# Patient Record
Sex: Female | Born: 1945
Health system: Southern US, Community
[De-identification: ages and names within clinical notes are randomized; demographics above are authoritative.]

## PROBLEM LIST (undated history)

## (undated) DIAGNOSIS — F32A Depression, unspecified: Secondary | ICD-10-CM

## (undated) DIAGNOSIS — I10 Essential (primary) hypertension: Secondary | ICD-10-CM

## (undated) DIAGNOSIS — E785 Hyperlipidemia, unspecified: Secondary | ICD-10-CM

## (undated) DIAGNOSIS — Z9889 Other specified postprocedural states: Secondary | ICD-10-CM

## (undated) DIAGNOSIS — E1165 Type 2 diabetes mellitus with hyperglycemia: Secondary | ICD-10-CM

## (undated) DIAGNOSIS — F419 Anxiety disorder, unspecified: Secondary | ICD-10-CM

## (undated) DIAGNOSIS — F329 Major depressive disorder, single episode, unspecified: Secondary | ICD-10-CM

## (undated) DIAGNOSIS — T8859XA Other complications of anesthesia, initial encounter: Secondary | ICD-10-CM

## (undated) DIAGNOSIS — R112 Nausea with vomiting, unspecified: Secondary | ICD-10-CM

## (undated) DIAGNOSIS — G43909 Migraine, unspecified, not intractable, without status migrainosus: Secondary | ICD-10-CM

## (undated) DIAGNOSIS — M8580 Other specified disorders of bone density and structure, unspecified site: Secondary | ICD-10-CM

## (undated) HISTORY — PX: CHOLECYSTECTOMY: SHX55

## (undated) HISTORY — DX: Migraine, unspecified, not intractable, without status migrainosus: G43.909

## (undated) HISTORY — PX: APPENDECTOMY: SHX54

## (undated) HISTORY — DX: Major depressive disorder, single episode, unspecified: F32.9

## (undated) HISTORY — DX: Hyperlipidemia, unspecified: E78.5

## (undated) HISTORY — DX: Type 2 diabetes mellitus with hyperglycemia: E11.65

## (undated) HISTORY — DX: Essential (primary) hypertension: I10

## (undated) HISTORY — DX: Depression, unspecified: F32.A

## (undated) HISTORY — DX: Other specified disorders of bone density and structure, unspecified site: M85.80

## (undated) HISTORY — PX: ABDOMINAL HYSTERECTOMY: SHX81

## (undated) HISTORY — DX: Anxiety disorder, unspecified: F41.9

---

## 2004-08-20 ENCOUNTER — Ambulatory Visit: Payer: Self-pay | Admitting: Internal Medicine

## 2005-01-02 ENCOUNTER — Emergency Department (HOSPITAL_COMMUNITY): Admission: EM | Admit: 2005-01-02 | Discharge: 2005-01-02 | Payer: Self-pay | Admitting: Emergency Medicine

## 2005-01-02 IMAGING — US US ABDOMEN COMPLETE
1 series · 14 of 25 positions shown · non-contrast
Comparison: None.

ABDOMEN ULTRASOUND:

CLINICAL DATA: Abdominal pain.
TECHNIQUE: Complete abdominal ultrasound examination was performed including
evaluation of the liver, gallbladder, bile ducts, pancreas, kidneys, spleen,
IVC, and abdominal aorta.

[Series 1: unknown · 0.30mm/px · 14 of 73 slices shown]
[im 1/73]
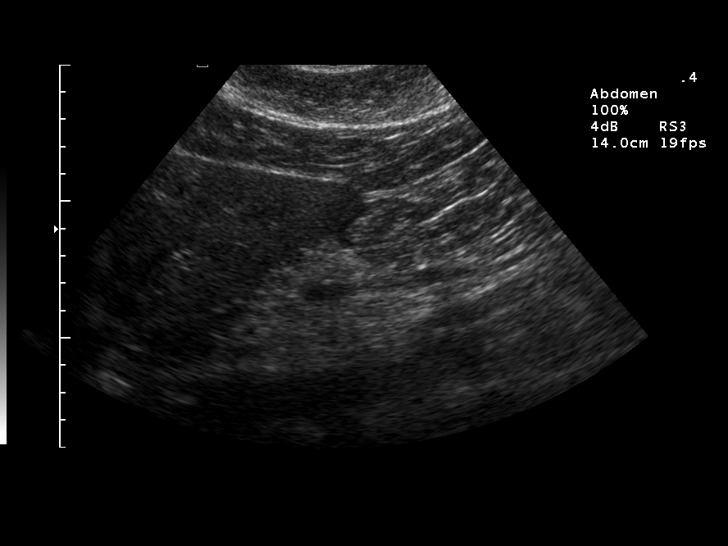
[im 7/73]
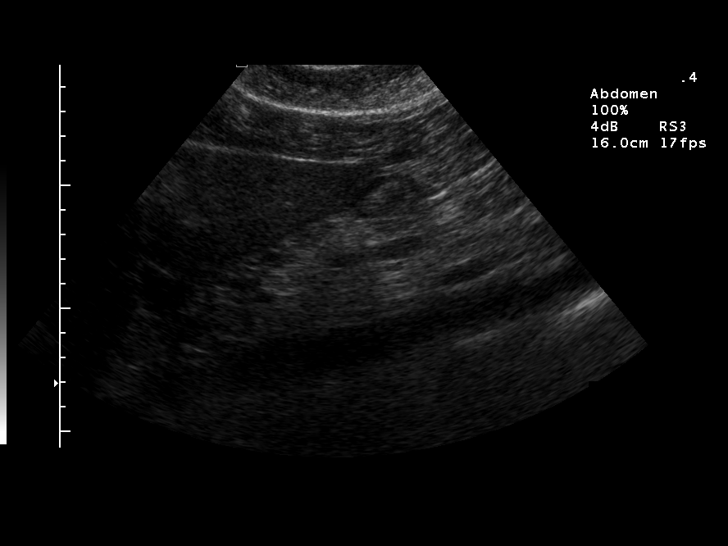
[im 13/73]
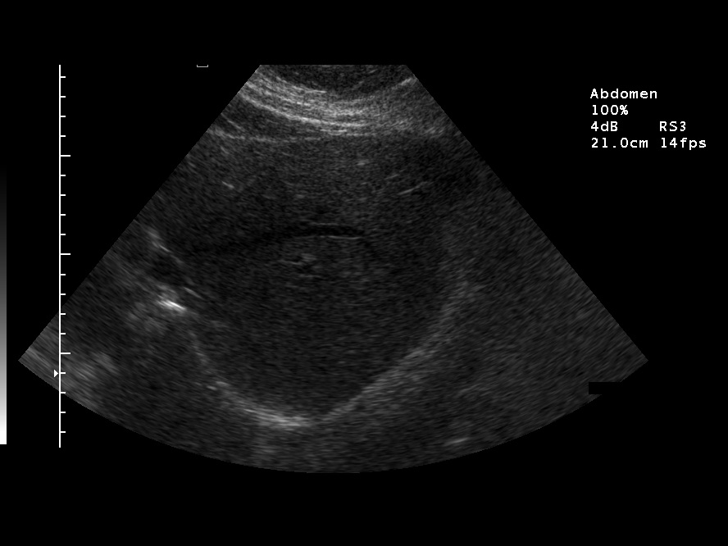
[im 19/73]
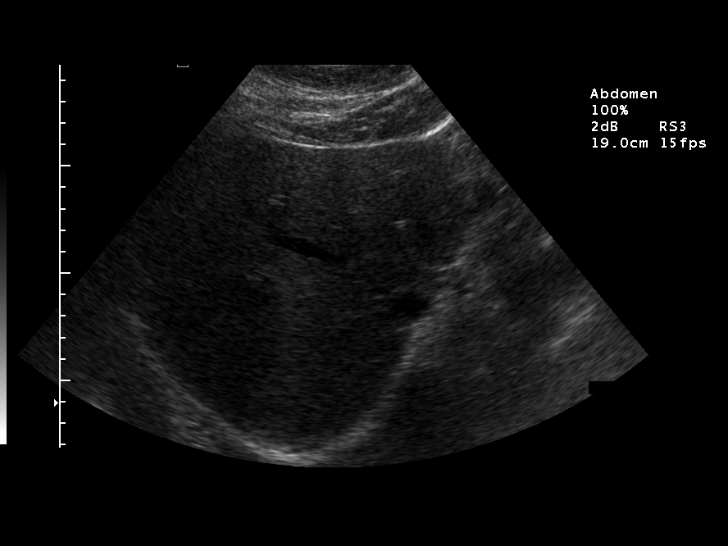
[im 25/73]
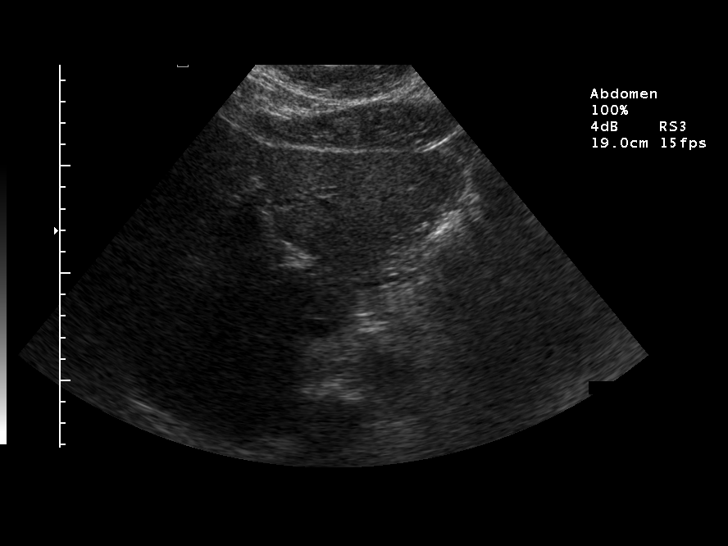
[im 28/73]
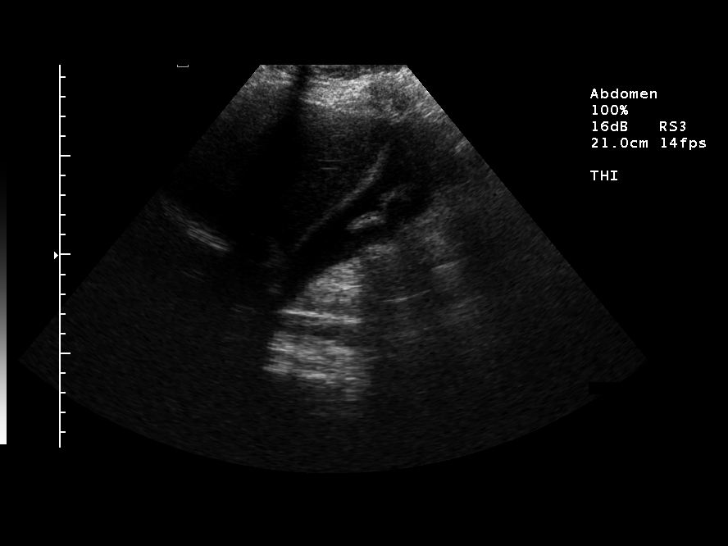
[im 34/73]
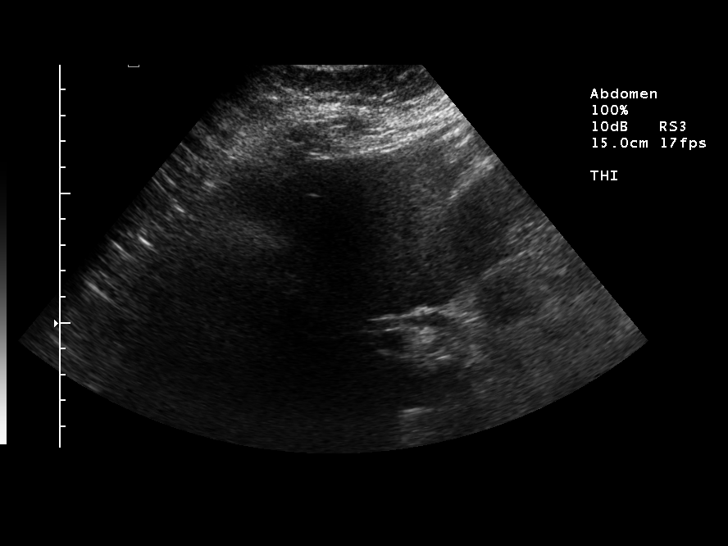
[im 40/73]
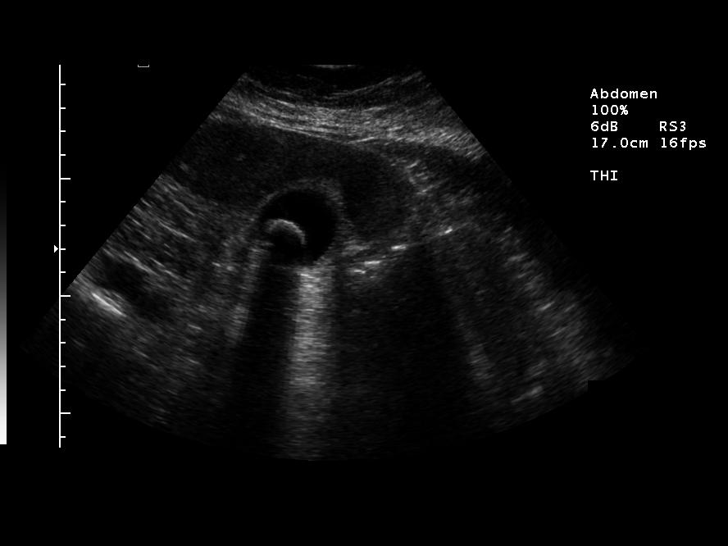
[im 46/73]
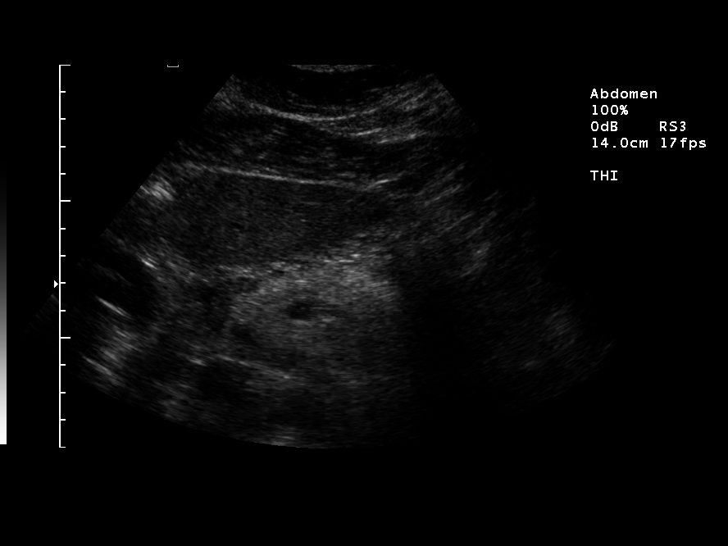
[im 49/73]
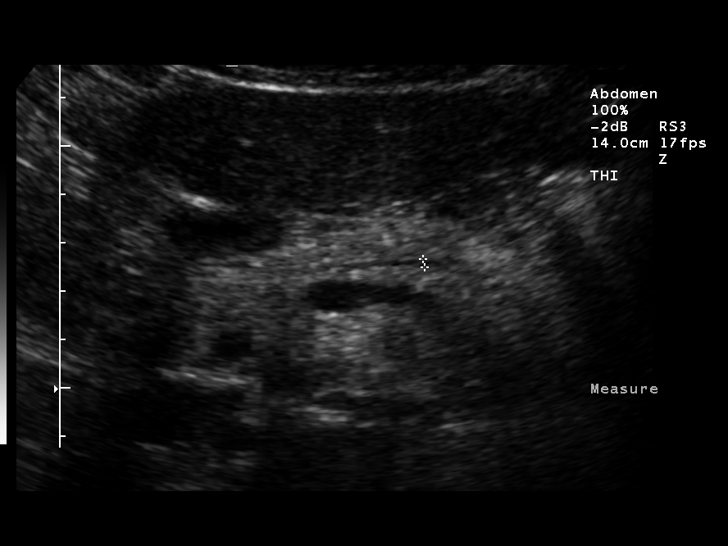
[im 55/73]
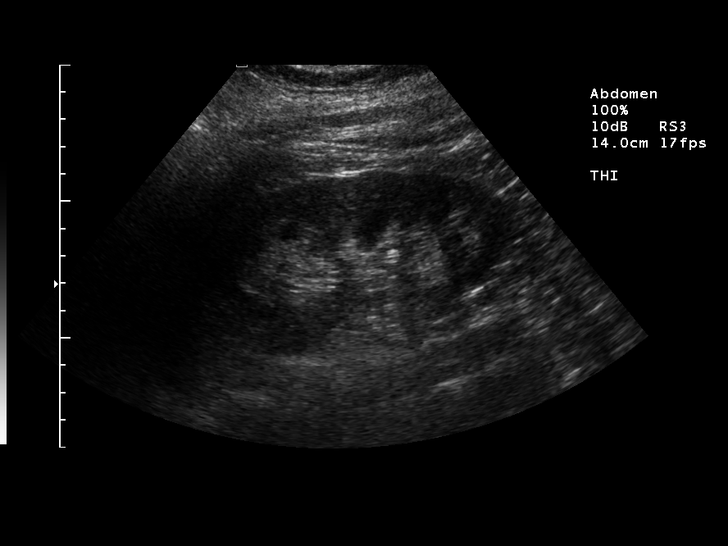
[im 61/73]
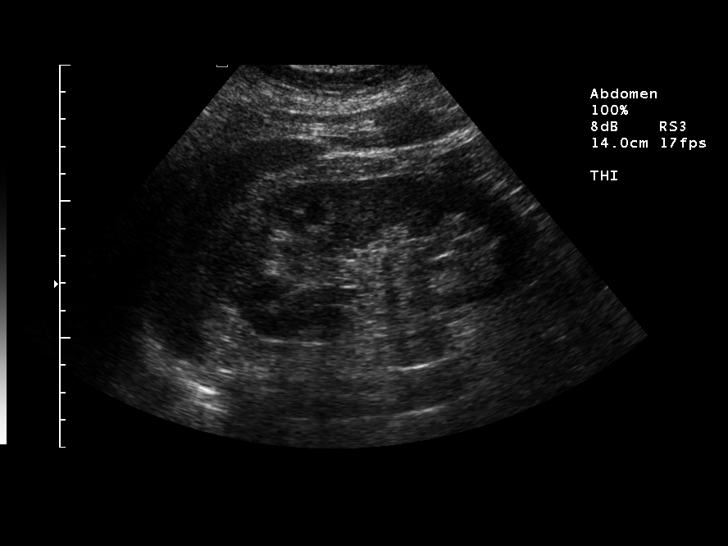
[im 67/73]
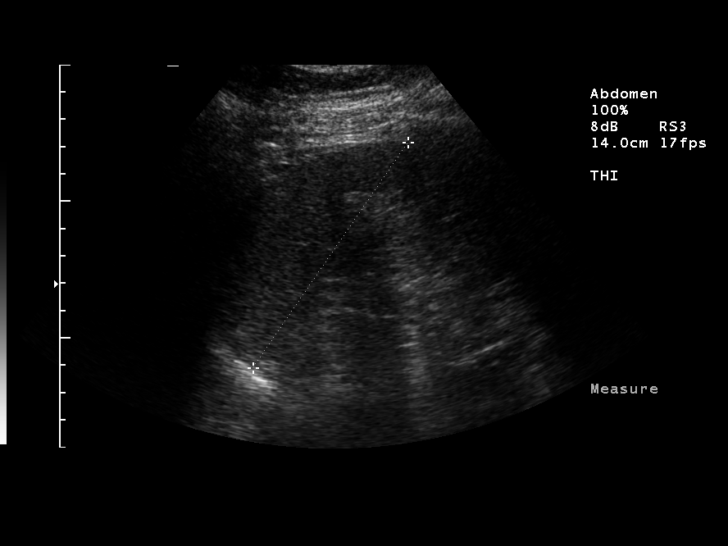
[im 73/73]
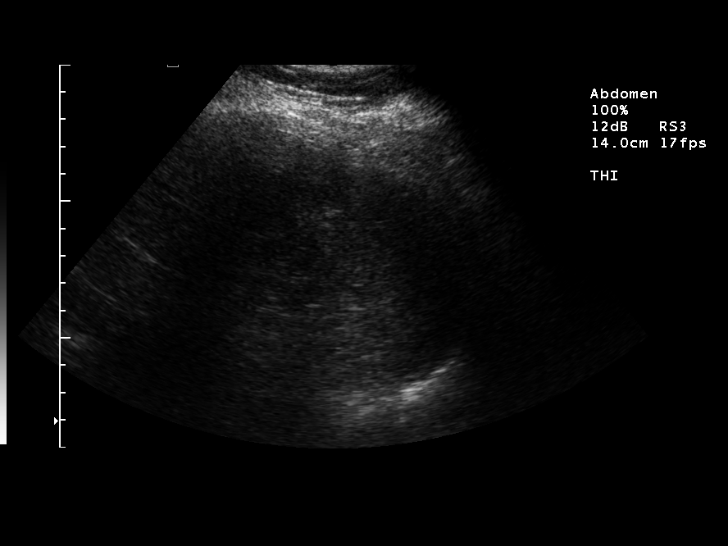

[14 of 25 positions shown; findings below may reference images not displayed]

FINDINGS: Gallbladder: Pole echogenic stones in the gallbladder lumen. There is no
pericholecystic fluid or gallbladder wall thickening. No sonographic Murphy sign
is elicited by the sonographer 's report.

Common Bile Duct:  Nondilated. 4-5 mm in diameter.

Liver:  Normal

Inferior Vena Cava:  Normal

Pancreas:  Tail not well seen secondary to overlying bowel gas.

Spleen:  Normal

Right Kidney:  Normal

Left Kidney:  5 mm nonobstructing interpolar stone.

Aorta:  No aneurysm
IMPRESSION: Uncomplicated cholelithiasis.

Apparent 5 mm nonobstructing left renal stone.

## 2005-02-04 IMAGING — CR DG CHEST 2V
2 series · 2 of 2 positions shown · non-contrast
Comparison: None.

CLINICAL DATA: Preop respiratory exam for cholecystectomy.  
 PA AND LATERAL CHEST:

[view not recorded (1 of 2)]
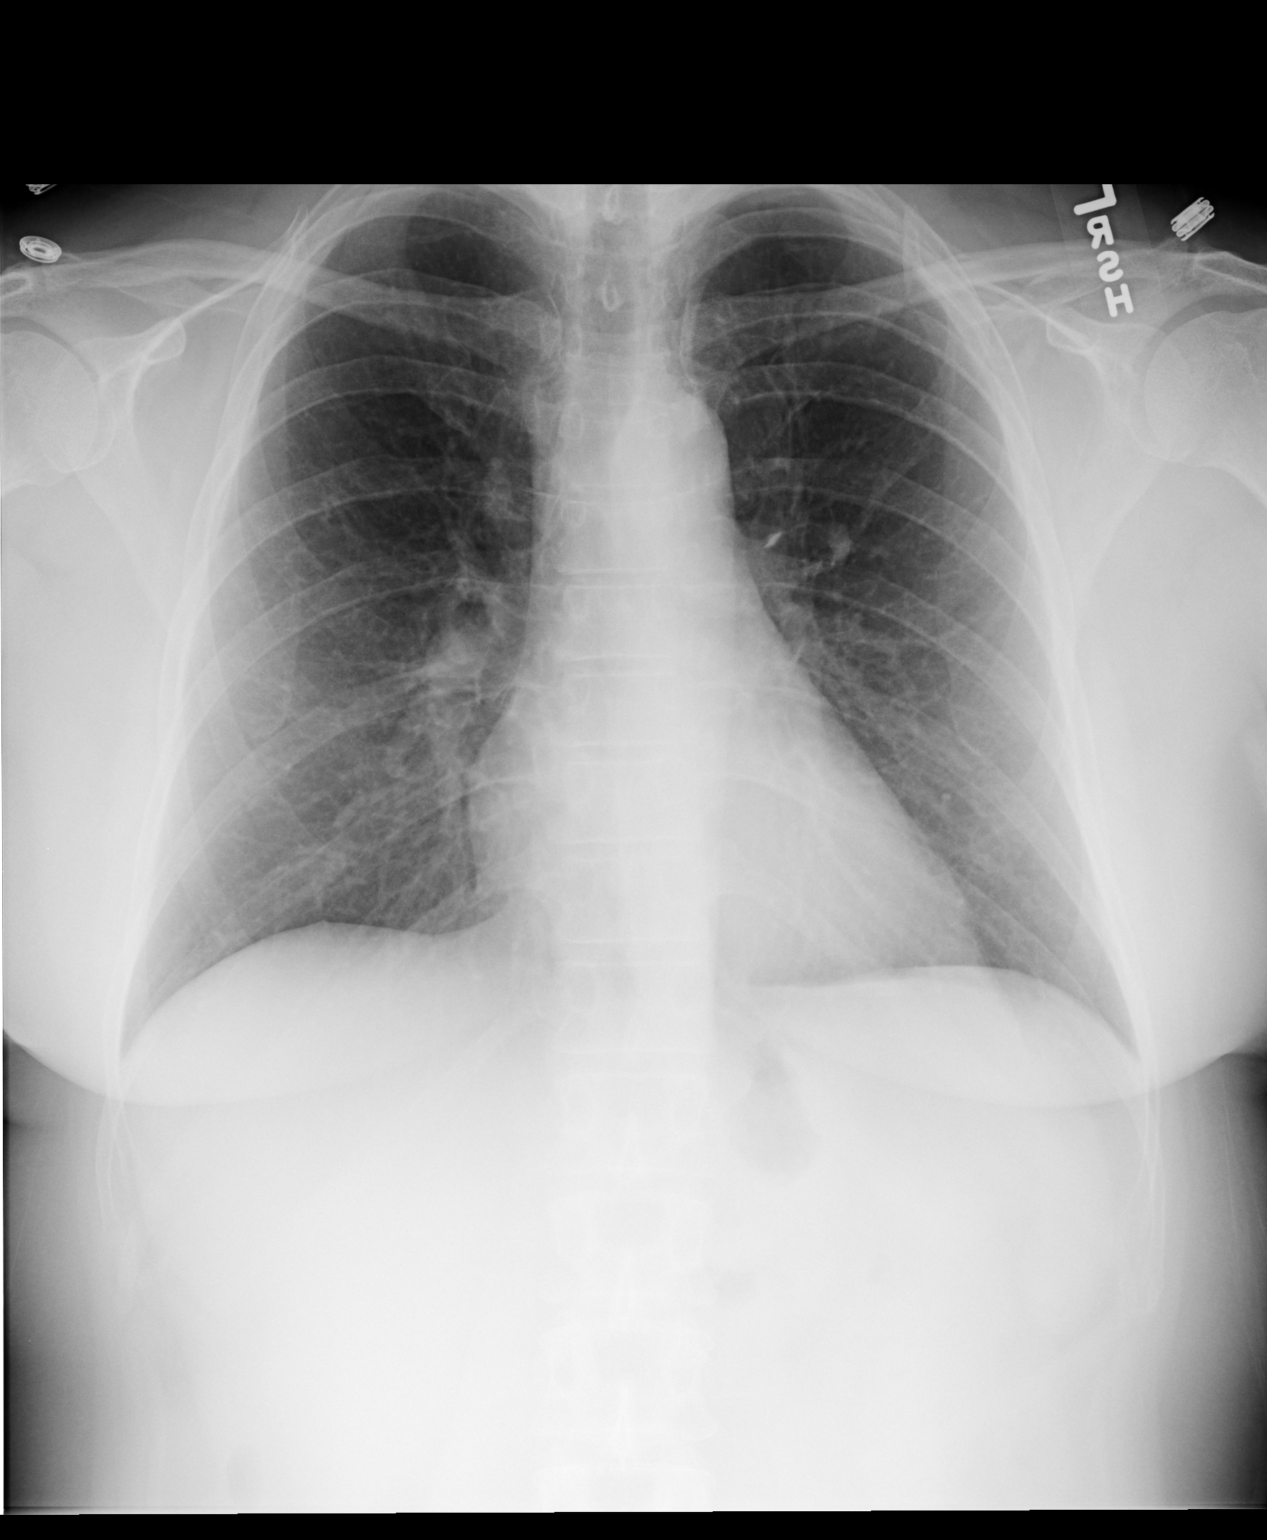

[view not recorded (2 of 2)]
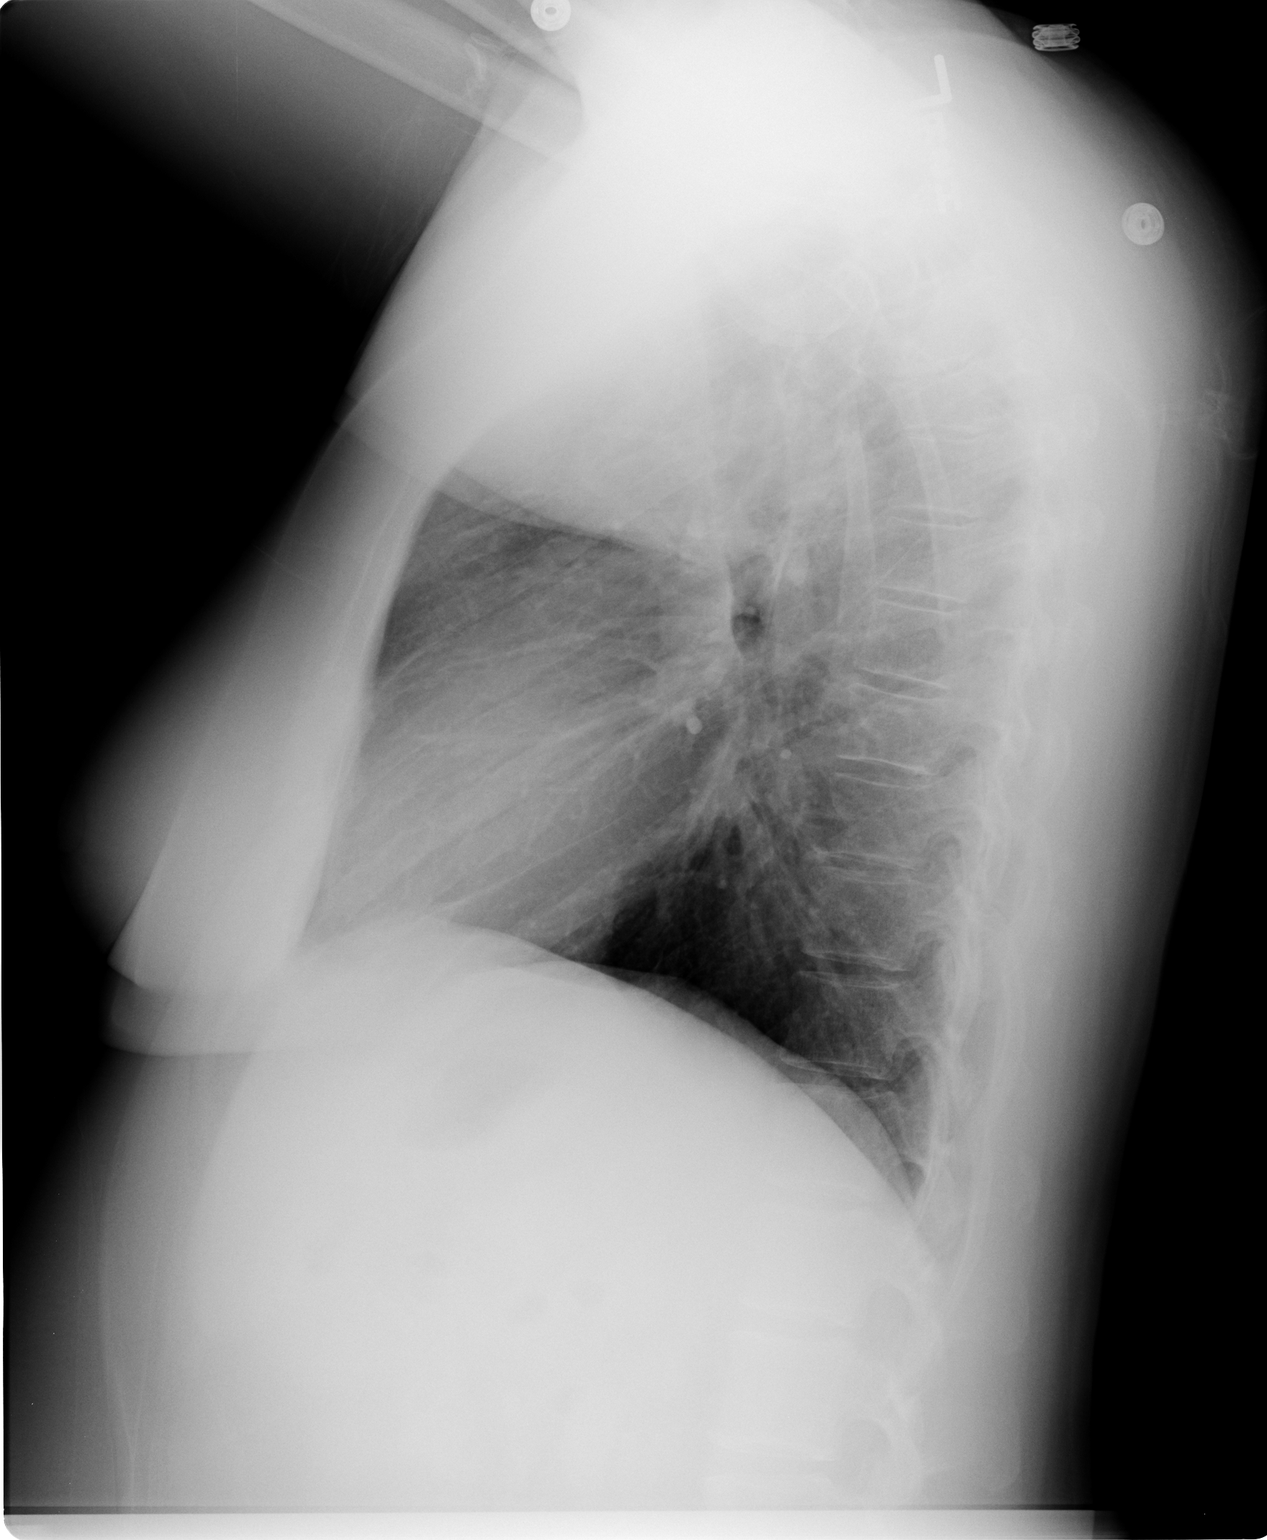

[2 of 2 positions shown; findings below may reference images not displayed]

The cardiomediastinal contours are normal.  The lungs are clear.  There is no pleural effusion or pneumothorax.
IMPRESSION: No active cardiopulmonary process demonstrated.

## 2005-02-08 ENCOUNTER — Ambulatory Visit (HOSPITAL_COMMUNITY): Admission: RE | Admit: 2005-02-08 | Discharge: 2005-02-09 | Payer: Self-pay | Admitting: General Surgery

## 2005-02-08 ENCOUNTER — Encounter (INDEPENDENT_AMBULATORY_CARE_PROVIDER_SITE_OTHER): Payer: Self-pay | Admitting: *Deleted

## 2005-09-03 ENCOUNTER — Ambulatory Visit: Payer: Self-pay | Admitting: Internal Medicine

## 2006-08-08 ENCOUNTER — Ambulatory Visit: Payer: Self-pay | Admitting: Internal Medicine

## 2006-08-08 LAB — CONVERTED CEMR LAB
AST: 28 units/L (ref 0–37)
Bilirubin Urine: NEGATIVE
Cholesterol: 219 mg/dL (ref 0–200)
Eosinophils Absolute: 0.2 10*3/uL (ref 0.0–0.6)
Eosinophils Relative: 2.5 % (ref 0.0–5.0)
GFR calc non Af Amer: 78 mL/min
Hemoglobin: 12.9 g/dL (ref 12.0–15.0)
Lymphocytes Relative: 31.4 % (ref 12.0–46.0)
MCHC: 35 g/dL (ref 30.0–36.0)
MCV: 92.3 fL (ref 78.0–100.0)
Monocytes Absolute: 0.5 10*3/uL (ref 0.2–0.7)
Monocytes Relative: 5.2 % (ref 3.0–11.0)
Neutro Abs: 5.3 10*3/uL (ref 1.4–7.7)
Nitrite: NEGATIVE
Potassium: 4.3 meq/L (ref 3.5–5.1)
RBC: 3.99 M/uL (ref 3.87–5.11)
Sodium: 137 meq/L (ref 135–145)
Specific Gravity, Urine: 1.025 (ref 1.000–1.03)
TSH: 1.86 microintl units/mL (ref 0.35–5.50)
Total Bilirubin: 0.7 mg/dL (ref 0.3–1.2)
Total Protein: 7.4 g/dL (ref 6.0–8.3)
Triglycerides: 137 mg/dL (ref 0–149)
VLDL: 27 mg/dL (ref 0–40)
WBC: 8.7 10*3/uL (ref 4.5–10.5)

## 2006-08-13 ENCOUNTER — Ambulatory Visit: Payer: Self-pay | Admitting: Internal Medicine

## 2007-05-25 ENCOUNTER — Telehealth (INDEPENDENT_AMBULATORY_CARE_PROVIDER_SITE_OTHER): Payer: Self-pay | Admitting: *Deleted

## 2007-05-27 ENCOUNTER — Ambulatory Visit: Payer: Self-pay | Admitting: Internal Medicine

## 2007-05-27 DIAGNOSIS — F32A Depression, unspecified: Secondary | ICD-10-CM | POA: Insufficient documentation

## 2007-05-27 DIAGNOSIS — E785 Hyperlipidemia, unspecified: Secondary | ICD-10-CM | POA: Insufficient documentation

## 2007-05-27 DIAGNOSIS — M949 Disorder of cartilage, unspecified: Secondary | ICD-10-CM

## 2007-05-27 DIAGNOSIS — M899 Disorder of bone, unspecified: Secondary | ICD-10-CM | POA: Insufficient documentation

## 2007-05-27 DIAGNOSIS — I10 Essential (primary) hypertension: Secondary | ICD-10-CM | POA: Insufficient documentation

## 2007-05-27 DIAGNOSIS — H8110 Benign paroxysmal vertigo, unspecified ear: Secondary | ICD-10-CM | POA: Insufficient documentation

## 2007-05-27 DIAGNOSIS — F329 Major depressive disorder, single episode, unspecified: Secondary | ICD-10-CM

## 2007-05-27 DIAGNOSIS — F411 Generalized anxiety disorder: Secondary | ICD-10-CM | POA: Insufficient documentation

## 2007-07-08 ENCOUNTER — Ambulatory Visit: Payer: Self-pay | Admitting: Internal Medicine

## 2007-07-09 LAB — CONVERTED CEMR LAB
Albumin: 4 g/dL (ref 3.5–5.2)
Alkaline Phosphatase: 54 units/L (ref 39–117)
BUN: 11 mg/dL (ref 6–23)
Bilirubin Urine: NEGATIVE
CO2: 29 meq/L (ref 19–32)
Calcium: 9.4 mg/dL (ref 8.4–10.5)
Cholesterol: 210 mg/dL (ref 0–200)
Direct LDL: 141.1 mg/dL
Eosinophils Absolute: 0.1 10*3/uL (ref 0.0–0.6)
Glucose, Bld: 105 mg/dL — ABNORMAL HIGH (ref 70–99)
HCT: 40.6 % (ref 36.0–46.0)
HDL: 44.3 mg/dL (ref >39.0)
Hemoglobin: 13.6 g/dL (ref 12.0–15.0)
Ketones, ur: NEGATIVE mg/dL
Lymphocytes Relative: 26.6 % (ref 12.0–46.0)
MCV: 93.8 fL (ref 78.0–100.0)
Monocytes Absolute: 0.8 10*3/uL — ABNORMAL HIGH (ref 0.2–0.7)
Neutro Abs: 5 10*3/uL (ref 1.4–7.7)
Potassium: 4.6 meq/L (ref 3.5–5.1)
Specific Gravity, Urine: >=1.03 (ref 1.000–1.03)
TSH: 1.29 microintl units/mL (ref 0.35–5.50)
Total CHOL/HDL Ratio: 4.7
Total Protein, Urine: NEGATIVE mg/dL
Triglycerides: 83 mg/dL (ref 0–149)
WBC: 8.1 10*3/uL (ref 4.5–10.5)
pH: 5.5 (ref 5.0–8.0)

## 2007-08-07 ENCOUNTER — Ambulatory Visit: Payer: Self-pay | Admitting: Internal Medicine

## 2008-01-29 ENCOUNTER — Ambulatory Visit: Payer: Self-pay | Admitting: Internal Medicine

## 2008-01-29 LAB — CONVERTED CEMR LAB
AST: 23 units/L (ref 0–37)
Alkaline Phosphatase: 51 units/L (ref 39–117)
Bilirubin, Direct: 0.1 mg/dL (ref 0.0–0.3)
Cholesterol: 176 mg/dL (ref 0–200)
HDL: 36.5 mg/dL — ABNORMAL LOW (ref >39.0)
LDL Cholesterol: 122 mg/dL — ABNORMAL HIGH (ref 0–99)
Total Bilirubin: 0.7 mg/dL (ref 0.3–1.2)
Triglycerides: 90 mg/dL (ref 0–149)
VLDL: 18 mg/dL (ref 0–40)

## 2008-02-03 ENCOUNTER — Ambulatory Visit: Payer: Self-pay | Admitting: Internal Medicine

## 2008-02-03 DIAGNOSIS — R259 Unspecified abnormal involuntary movements: Secondary | ICD-10-CM | POA: Insufficient documentation

## 2008-02-03 DIAGNOSIS — M79609 Pain in unspecified limb: Secondary | ICD-10-CM | POA: Insufficient documentation

## 2008-07-28 ENCOUNTER — Ambulatory Visit: Payer: Self-pay | Admitting: Internal Medicine

## 2008-07-29 LAB — CONVERTED CEMR LAB
AST: 22 units/L (ref 0–37)
Albumin: 3.9 g/dL (ref 3.5–5.2)
BUN: 14 mg/dL (ref 6–23)
Basophils Absolute: 0.1 10*3/uL (ref 0.0–0.1)
Bilirubin Urine: NEGATIVE
CO2: 29 meq/L (ref 19–32)
Calcium: 9.4 mg/dL (ref 8.4–10.5)
Cholesterol: 126 mg/dL (ref 0–200)
Creatinine, Ser: 0.8 mg/dL (ref 0.4–1.2)
GFR calc Af Amer: 94 mL/min
Glucose, Bld: 107 mg/dL — ABNORMAL HIGH (ref 70–99)
HDL: 43.6 mg/dL (ref >39.0)
Hemoglobin, Urine: NEGATIVE
Hemoglobin: 14.4 g/dL (ref 12.0–15.0)
LDL Cholesterol: 67 mg/dL (ref 0–99)
Leukocytes, UA: NEGATIVE
Lymphocytes Relative: 28.4 % (ref 12.0–46.0)
Neutro Abs: 4.5 10*3/uL (ref 1.4–7.7)
Neutrophils Relative %: 56.7 % (ref 43.0–77.0)
Nitrite: NEGATIVE
Platelets: 227 10*3/uL (ref 150–400)
RDW: 12.5 % (ref 11.5–14.6)
Sodium: 140 meq/L (ref 135–145)
Specific Gravity, Urine: <=1.005 (ref 1.000–1.03)
Total CHOL/HDL Ratio: 2.9
Total Protein, Urine: NEGATIVE mg/dL
Triglycerides: 78 mg/dL (ref 0–149)
WBC: 7.9 10*3/uL (ref 4.5–10.5)
pH: 6 (ref 5.0–8.0)

## 2008-08-03 ENCOUNTER — Ambulatory Visit: Payer: Self-pay | Admitting: Internal Medicine

## 2009-03-08 ENCOUNTER — Ambulatory Visit: Payer: Self-pay | Admitting: Pulmonary Disease

## 2009-10-10 ENCOUNTER — Telehealth: Payer: Self-pay | Admitting: Internal Medicine

## 2009-12-01 ENCOUNTER — Ambulatory Visit: Payer: Self-pay | Admitting: Internal Medicine

## 2009-12-01 DIAGNOSIS — N959 Unspecified menopausal and perimenopausal disorder: Secondary | ICD-10-CM | POA: Insufficient documentation

## 2009-12-01 LAB — CONVERTED CEMR LAB
AST: 26 units/L (ref 0–37)
Alkaline Phosphatase: 57 units/L (ref 39–117)
Basophils Absolute: 0.1 10*3/uL (ref 0.0–0.1)
Basophils Relative: 1.1 % (ref 0.0–3.0)
Bilirubin Urine: NEGATIVE
Bilirubin, Direct: 0.2 mg/dL (ref 0.0–0.3)
Calcium: 8.8 mg/dL (ref 8.4–10.5)
Chloride: 107 meq/L (ref 96–112)
Creatinine, Ser: 0.6 mg/dL (ref 0.4–1.2)
Eosinophils Absolute: 0.4 10*3/uL (ref 0.0–0.7)
Eosinophils Relative: 3.1 % (ref 0.0–5.0)
GFR calc non Af Amer: 103.48 mL/min (ref >60)
HCT: 40 % (ref 36.0–46.0)
HDL: 58.3 mg/dL (ref >39.00)
Hemoglobin: 13.8 g/dL (ref 12.0–15.0)
Lymphs Abs: 2.9 10*3/uL (ref 0.7–4.0)
MCV: 95.4 fL (ref 78.0–100.0)
Monocytes Absolute: 0.9 10*3/uL (ref 0.1–1.0)
Neutro Abs: 8.4 10*3/uL — ABNORMAL HIGH (ref 1.4–7.7)
Potassium: 4.4 meq/L (ref 3.5–5.1)
RBC: 4.2 M/uL (ref 3.87–5.11)
Sodium: 141 meq/L (ref 135–145)
Specific Gravity, Urine: >=1.03 (ref 1.000–1.030)
Total Bilirubin: 0.7 mg/dL (ref 0.3–1.2)
Total Protein, Urine: NEGATIVE mg/dL
Triglycerides: 102 mg/dL (ref 0.0–149.0)
WBC: 12.4 10*3/uL — ABNORMAL HIGH (ref 4.5–10.5)
pH: 5 (ref 5.0–8.0)

## 2009-12-03 LAB — CONVERTED CEMR LAB: Vit D, 25-Hydroxy: 24 ng/mL — ABNORMAL LOW (ref 30–89)

## 2009-12-13 ENCOUNTER — Encounter: Payer: Self-pay | Admitting: Internal Medicine

## 2009-12-13 ENCOUNTER — Ambulatory Visit: Payer: Self-pay | Admitting: Family Medicine

## 2010-03-07 ENCOUNTER — Ambulatory Visit: Payer: Self-pay | Admitting: Pulmonary Disease

## 2010-04-05 ENCOUNTER — Ambulatory Visit: Payer: Self-pay | Admitting: Internal Medicine

## 2010-06-11 ENCOUNTER — Emergency Department (HOSPITAL_COMMUNITY)
Admission: EM | Admit: 2010-06-11 | Discharge: 2010-06-11 | Payer: Self-pay | Source: Home / Self Care | Admitting: Family Medicine

## 2010-06-11 IMAGING — CR DG HAND COMPLETE 3+V*R*
3 series · 3 of 3 positions shown · non-contrast
Comparison: None.

CLINICAL DATA: Hand injury.  Pain.

RIGHT HAND - COMPLETE 3+ VIEW

[view not recorded (1 of 3)]
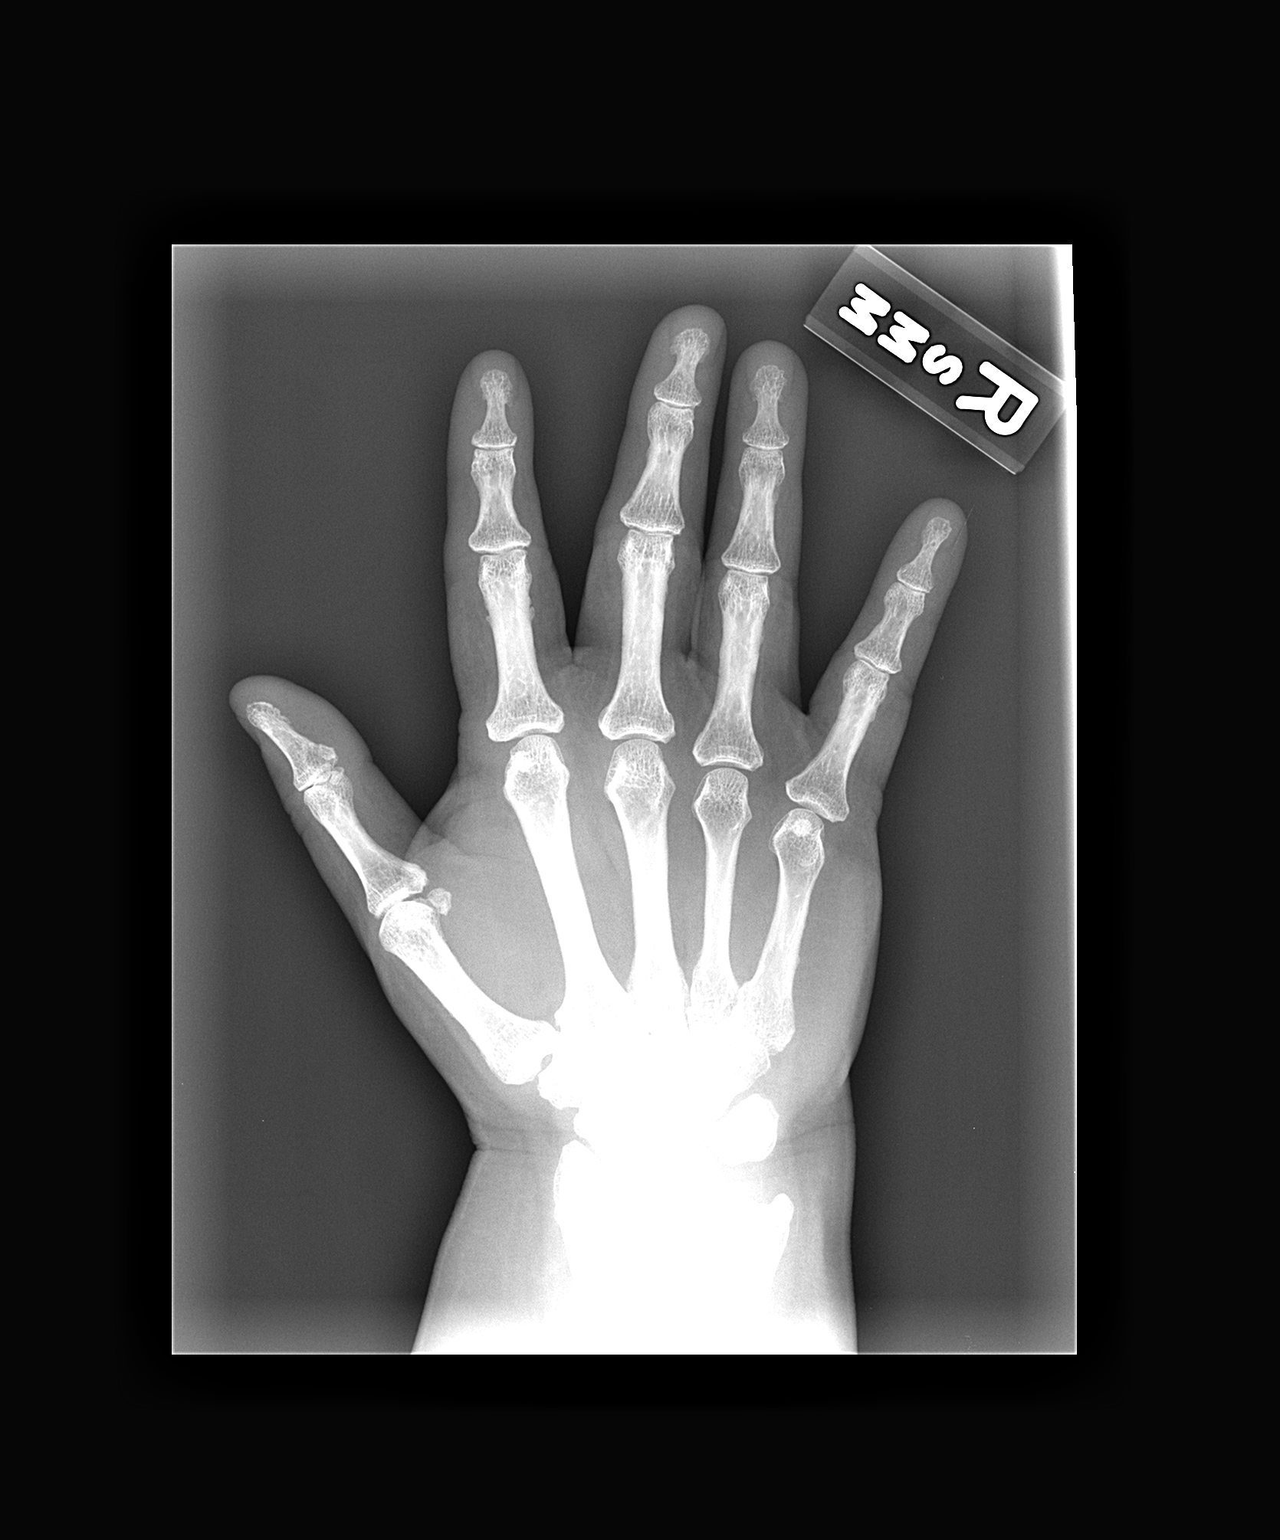

[view not recorded (2 of 3)]
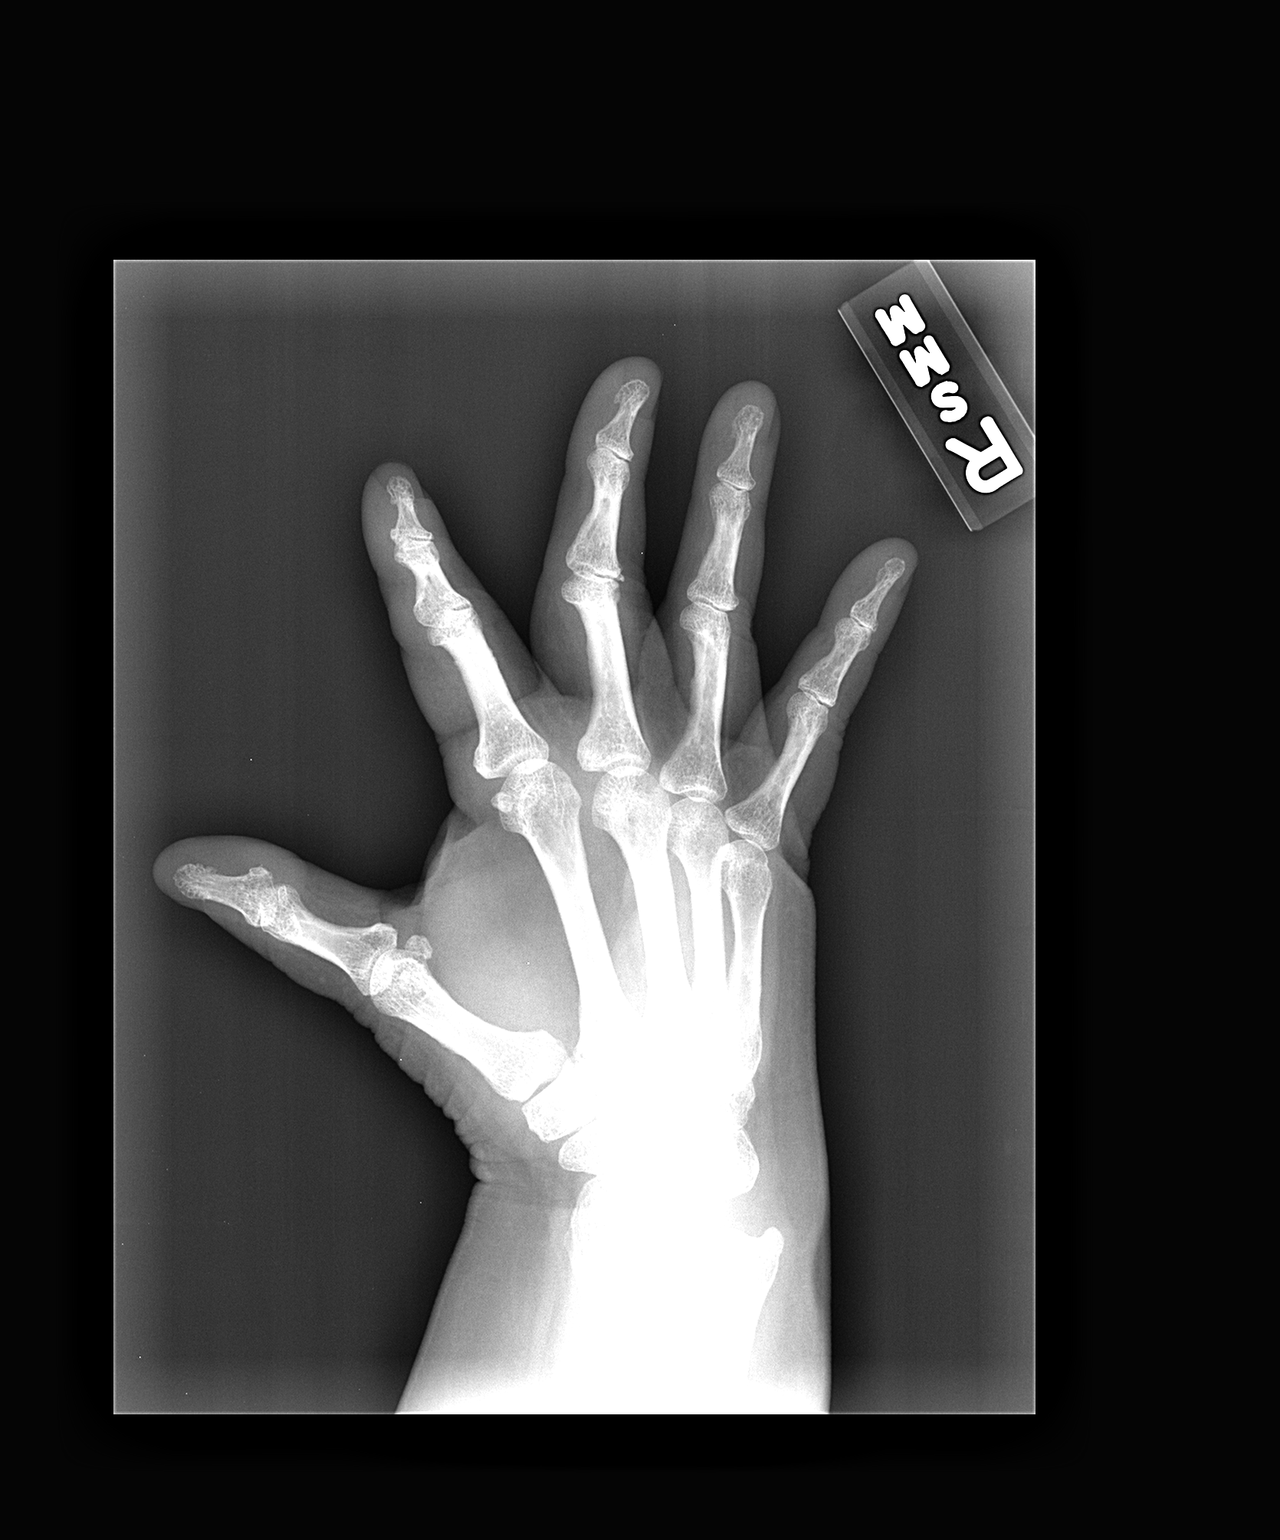

[view not recorded (3 of 3)]
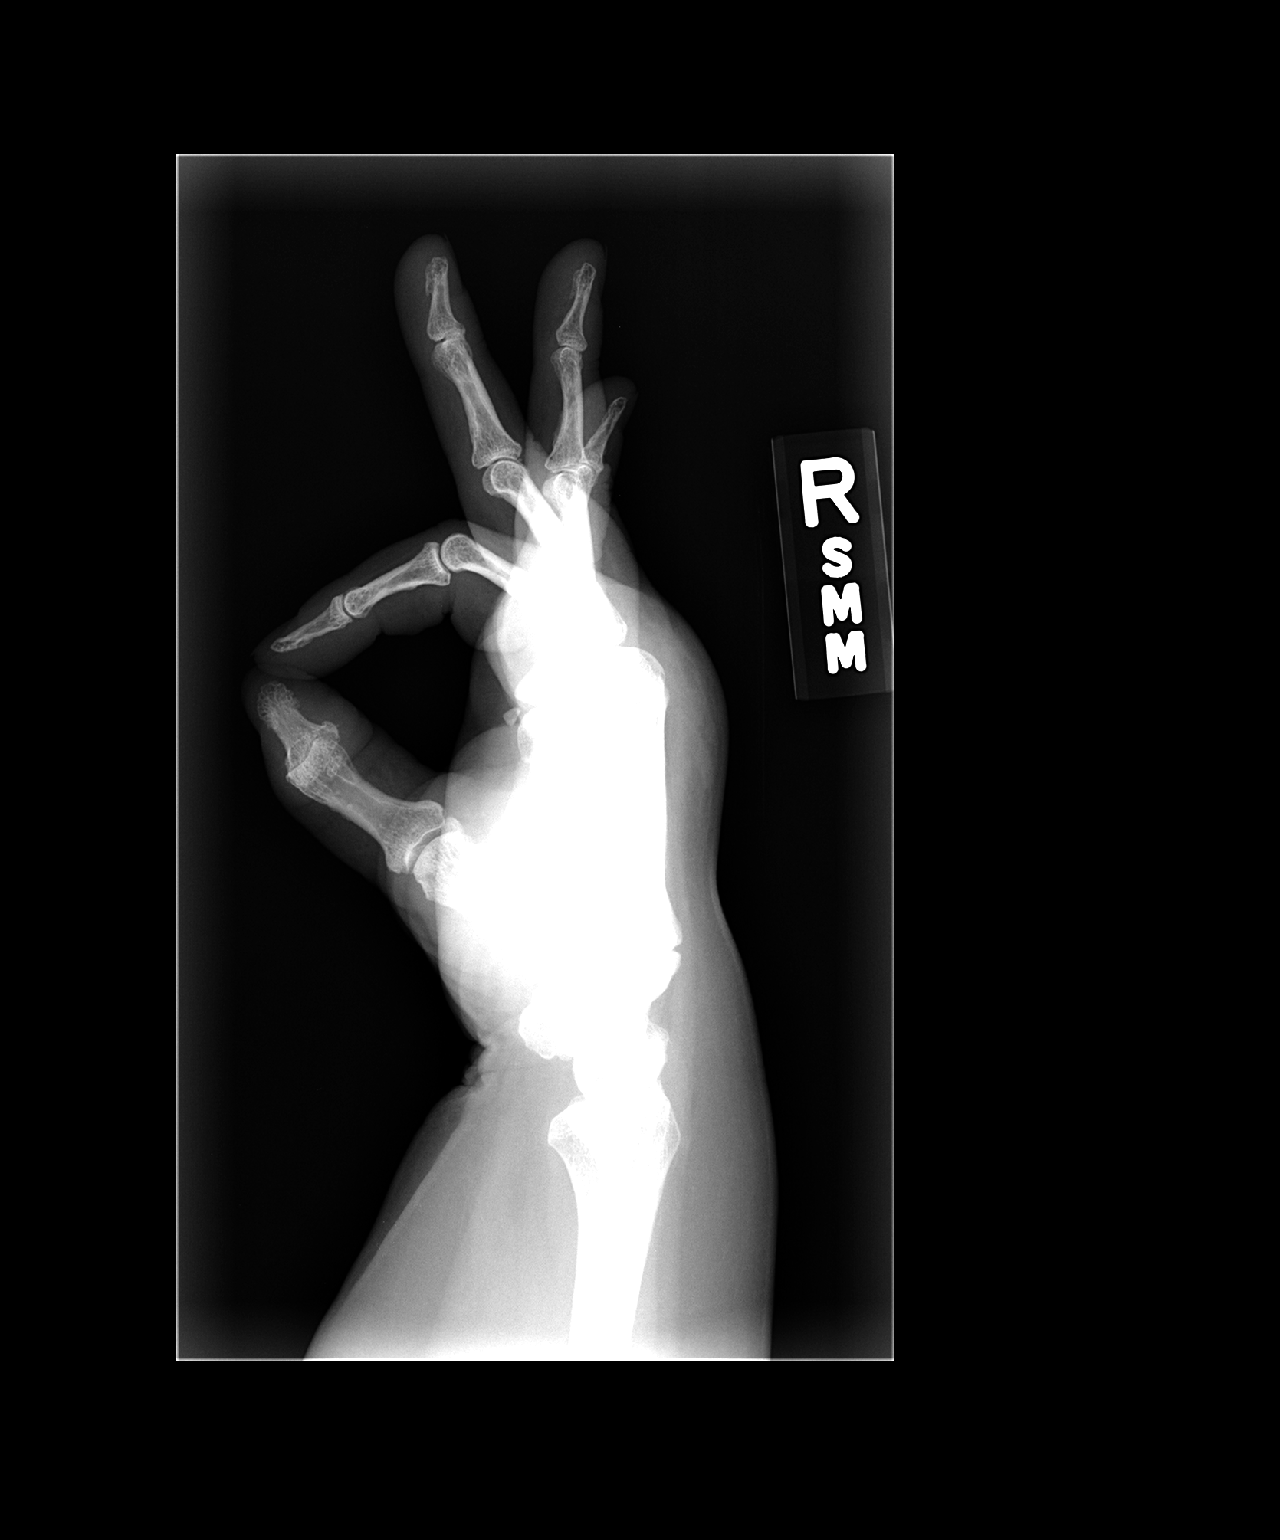

[3 of 3 positions shown; findings below may reference images not displayed]

FINDINGS: No acute fracture.  No subluxation or dislocation.  Tiny chronic
avulsion fragment seen at the base of the middle finger middle
phalanx.
IMPRESSION: No acute bony abnormality.

## 2010-07-10 NOTE — Assessment & Plan Note (Signed)
Summary: cold-lb   Vital Signs:  Patient profile:   65 year old female Height:      62 inches Weight:      217.38 pounds BMI:     39.90 O2 Sat:      95 % on Room air Temp:     99 degrees F oral Pulse rate:   84 / minute BP sitting:   110 / 80  (left arm) Cuff size:   large  Vitals Entered By: Zella Ball Ewing CMA Duncan Dull) (April 05, 2010 2:33 PM)  O2 Flow:  Room air CC: Chest congestion/RE   CC:  Chest congestion/RE.  History of Present Illness: here with acute osnet mild to mod illness for 2-3 days, with fever, ST, prod cough greenish sputum and general malaise and weakness, but Pt denies CP, worsening sob, doe, wheezing, orthopnea, pnd, worsening LE edema, palps, dizziness or syncope  Pt denies new neuro symptoms such as headache, facial or extremity weakness.  Pt denies polydipsia, polyuria.  Did have recent dxa and wants to go over results,  trying to get more wt bearing excercise, wtih calcium/vitd supplement.    Problems Prior to Update: 1)  Bronchitis-acute  (ICD-466.0) 2)  Menopausal Disorder  (ICD-627.9) 3)  Preventive Health Care  (ICD-V70.0) 4)  Leg Pain, Left  (ICD-729.5) 5)  Tremor  (ICD-781.0) 6)  Preventive Health Care  (ICD-V70.0) 7)  Routine General Medical Exam@health  Care [facility]  (ICD-V70.0) 8)  Positional Vertigo  (ICD-386.11) 9)  Hyperlipidemia  (ICD-272.4) 10)  Depression  (ICD-311) 11)  Anxiety  (ICD-300.00) 12)  Osteopenia  (ICD-733.90) 13)  Hypertension  (ICD-401.9)  Medications Prior to Update: 1)  Fosamax 70 Mg Tabs (Alendronate Sodium) .... Take 1 Tablet By Mouth Once A Week 2)  Lisinopril 20 Mg Tabs (Lisinopril) .Marland Kitchen.. 1 By Mouth Once Daily 3)  Ecotrin Low Strength 81 Mg  Tbec (Aspirin) .Marland Kitchen.. 1po Qd 4)  Pravachol 40 Mg  Tabs (Pravastatin Sodium) .... 2 By Mouth Once Daily 5)  Diltiazem Hcl Cr 240 Mg Xr24h-Cap (Diltiazem Hcl) .Marland Kitchen.. 1 By Mouth Once Daily  Current Medications (verified): 1)  Fosamax 70 Mg Tabs (Alendronate Sodium) .... Take 1 Tablet By  Mouth Once A Week 2)  Lisinopril 20 Mg Tabs (Lisinopril) .Marland Kitchen.. 1 By Mouth Once Daily 3)  Ecotrin Low Strength 81 Mg  Tbec (Aspirin) .Marland Kitchen.. 1po Qd 4)  Pravachol 40 Mg  Tabs (Pravastatin Sodium) .... 2 By Mouth Once Daily 5)  Diltiazem Hcl Cr 240 Mg Xr24h-Cap (Diltiazem Hcl) .Marland Kitchen.. 1 By Mouth Once Daily 6)  Azithromycin 250 Mg Tabs (Azithromycin) .... 2po Qd For 1 Day, Then 1po Qd For 4days, Then Stop 7)  Hydrocodone-Homatropine 5-1.5 Mg/109ml Syrp (Hydrocodone-Homatropine) .Marland Kitchen.. 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  , mild ill  Head:  normocephalic and atraumatic.   Eyes:  vision grossly intact, pupils equal, and pupils round.   Ears:  bilat tm's mild erythema, canals clear Nose:  nasal dischargemucosal  pallor and mucosal edema.   Mouth:  pharyngeal erythema and fair dentition.   Neck:  supple and no masses.   Lungs:  normal respiratory effort and normal breath sounds.   Heart:  normal rate and regular rhythm.   Extremities:  no edema, no erythema    Impression & Recommendations:  Problem # 1:  BRONCHITIS-ACUTE (ICD-466.0)  Her updated medication list for this problem includes:    Azithromycin 250 Mg Tabs (Azithromycin) .Marland Kitchen... 2po qd for 1 day, then 1po qd for 4days, then stop    Hydrocodone-homatropine 5-1.5 Mg/92ml Syrp (Hydrocodone-homatropine) .Marland Kitchen... 1 tsp by mouth q 6 hrs as needed cough treat as above,  f/u any worsening signs or symptoms   Problem # 2:  OSTEOPENIA (ICD-733.90)  Her updated medication list for this problem includes:    Fosamax 70 Mg Tabs (Alendronate sodium) .Marland Kitchen... Take 1 tablet by mouth once a week stable overall by hx and exam, ok to continue meds/tx as is , recent dxa reviewed with pt, cont calcium, vit d, meds, excercise, f/u dxa 2 yrs  Problem # 3:  HYPERTENSION (ICD-401.9)  Her updated medication list for this problem includes:    Lisinopril 20 Mg Tabs (Lisinopril) .Marland Kitchen... 1 by mouth once daily    Diltiazem Hcl Cr 240 Mg Xr24h-cap (Diltiazem hcl) .Marland Kitchen... 1 by mouth once daily  BP today: 110/80 Prior BP: 120/72 (12/01/2009)  Labs Reviewed: K+: 4.4 (12/01/2009) Creat: : 0.6 (12/01/2009)   Chol: 158 (12/01/2009)   HDL: 58.30 (12/01/2009)   LDL: 79 (12/01/2009)   TG: 102.0 (12/01/2009) stable overall by hx and exam, ok to continue meds/tx as is   Complete Medication List: 1)  Fosamax 70 Mg Tabs (Alendronate sodium) .... Take 1 tablet by mouth once a week 2)  Lisinopril 20 Mg Tabs (Lisinopril) .Marland Kitchen.. 1 by mouth once daily 3)  Ecotrin Low Strength 81 Mg Tbec (Aspirin) .Marland Kitchen.. 1po qd 4)  Pravachol 40 Mg Tabs (Pravastatin sodium) .... 2 by mouth once daily 5)  Diltiazem Hcl Cr 240 Mg Xr24h-cap (Diltiazem hcl) .Marland Kitchen.. 1 by mouth once daily 6)  Azithromycin 250 Mg Tabs (Azithromycin) .... 2po qd for 1 day, then 1po qd for 4days, then stop 7)  Hydrocodone-homatropine 5-1.5 Mg/58ml Syrp (Hydrocodone-homatropine) .Marland Kitchen.. 1 tsp by mouth q 6 hrs as needed cough  Patient Instructions: 1)  Please take all new medications as prescribed 2)  Continue all previous medications as before this visit 3)  Please schedule a follow-up appointment in June 2012 with CPX labs, or sooner if needed Prescriptions: HYDROCODONE-HOMATROPINE 5-1.5 MG/5ML SYRP (HYDROCODONE-HOMATROPINE) 1 tsp by mouth q 6 hrs as needed cough  #6oz x 1   Entered and Authorized by:   Corwin Levins MD   Signed by:   Corwin Levins MD  on 04/05/2010   Method used:   Print then Give to Patient   RxID:   [id number] AZITHROMYCIN 250 MG TABS (AZITHROMYCIN) 2po qd for 1 day, then 1po qd for 4days, then stop  #6 x 1   Entered and Authorized by:   Corwin Levins MD   Signed by:   Corwin Levins MD on 04/05/2010   Method used:   Print then Give to Patient   RxID:   [id number]    Orders Added: 1)  Est. Patient Level IV [84696]

## 2010-07-10 NOTE — Assessment & Plan Note (Signed)
Summary: due for f/u appt per pt/#/cd   Vital Signs:  Patient profile:   65 year old female Height:      62 inches Weight:      214.75 pounds BMI:     39.42 O2 Sat:      98 % on Room air Temp:     97.7 degrees F oral Pulse rate:   70 / minute BP sitting:   120 / 72  (left arm) Cuff size:   large  Vitals Entered By: Zella Ball Ewing CMA Duncan Dull) (December 01, 2009 1:57 PM)  O2 Flow:  Room air  Preventive Care Screening  Mammogram:    Date:  06/10/2008    Results:  normal      declines colonoscopy  CC: followup/RE   CC:  followup/RE.  History of Present Illness: here to f/u  - wt up a few lbs, no OSA symptoms; overall doing well, Pt denies CP, sob, doe, wheezing, orthopnea, pnd, worsening LE edema, palps, dizziness or syncope  Pt denies new neuro symptoms such as headache, facial or extremity weakness   No worsening anxiety symtpoms, or panic, but has mild worsening depressive symptoms without suicidal ideation, seems situational due to husbands illness that has taken over their lives and finances.    Preventive Screening-Counseling & Management      Drug Use:  no.    Problems Prior to Update: 1)  Menopausal Disorder  (ICD-627.9) 2)  Preventive Health Care  (ICD-V70.0) 3)  Leg Pain, Left  (ICD-729.5) 4)  Tremor  (ICD-781.0) 5)  Preventive Health Care  (ICD-V70.0) 6)  Routine General Medical Exam@health  Care [facility]  (ICD-V70.0) 7)  Positional Vertigo  (ICD-386.11) 8)  Hyperlipidemia  (ICD-272.4) 9)  Depression  (ICD-311) 10)  Anxiety  (ICD-300.00) 11)  Osteopenia  (ICD-733.90) 12)  Hypertension  (ICD-401.9)  Medications Prior to Update: 1)  Diltiazem Hcl Coated Beads 240 Mg Cp24 (Diltiazem Hcl Coated Beads) .Marland Kitchen.. 1 Capsule By Mouth Once A Day 2)  Fosamax 70 Mg Tabs (Alendronate Sodium) .... Take 1 Tablet By Mouth Once A Week 3)  Lisinopril 20 Mg Tabs (Lisinopril) .Marland Kitchen.. 1 By Mouth Once Daily 4)  Ecotrin Low Strength 81 Mg  Tbec (Aspirin) .Marland Kitchen.. 1po Qd 5)  Pravachol 40 Mg  Tabs  (Pravastatin Sodium) .... 2 By Mouth Once Daily 6)  Pravachol 40 Mg Tabs (Pravastatin Sodium) .... 2 By Mouth Once Daily 7)  Pravachol 40 Mg Tabs (Pravastatin Sodium) .... 2 By Mouth Once Daily 8)  Diltiazem Hcl Coated Beads 240 Mg Xr24h-Cap (Diltiazem Hcl Coated Beads) .Marland Kitchen.. 1 By Mouth Once Daily  Current Medications (verified): 1)  Fosamax 70 Mg Tabs (Alendronate Sodium) .... Take 1 Tablet By Mouth Once A Week 2)  Lisinopril 20 Mg Tabs (Lisinopril) .Marland Kitchen.. 1 By Mouth Once Daily 3)  Ecotrin Low Strength 81 Mg  Tbec (Aspirin) .Marland Kitchen.. 1po Qd 4)  Pravachol 40 Mg  Tabs (Pravastatin Sodium) .... 2 By Mouth Once Daily 5)  Diltiazem Hcl Cr 240 Mg Xr24h-Cap (Diltiazem Hcl) .Marland Kitchen.. 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 mother with ovarian cancer father with HTN  Social History: Reviewed history from 08/03/2008 and no changes required. 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         all otherwise negative per pt -   except for mild depressive symptoms  - declines tx  Physical Exam  General:  alert and overweight-appearing.   Head:  normocephalic and atraumatic.   Eyes:  vision grossly intact, pupils equal, and pupils round.   Ears:  R ear normal and L ear normal.   Nose:  no external deformity and no nasal discharge.   Mouth:  no gingival abnormalities and pharynx pink and moist.   Neck:  supple and no masses.   Lungs:  normal respiratory effort and normal breath sounds.   Heart:  normal rate and regular rhythm.   Abdomen:  soft, non-tender, and normal bowel sounds.   Msk:  no joint tenderness and no joint swelling.   Extremities:  no edema, no erythema  Neurologic:  cranial nerves II-XII intact and strength normal in all extremities.     Impression & Recommendations:  Problem # 1:  Preventive Health Care (ICD-V70.0)  Overall doing well, age appropriate education and counseling updated and referral for appropriate preventive services done unless declined, immunizations up to date or declined, diet counseling done if overweight, urged to quit smoking if smokes , most recent labs reviewed and current ordered if appropriate, ecg reviewed or declined (interpretation per ECG scanned in the EMR if done); information regarding Medicare Prevention requirements given if appropriate; speciality referrals updated as appropriate ; declines colonoscopy at this time  Orders: TLB-BMP (Basic Metabolic Panel-BMET) (80048-METABOL) TLB-CBC Platelet - w/Differential (85025-CBCD) TLB-Hepatic/Liver Function Pnl (80076-HEPATIC) TLB-Lipid Panel (80061-LIPID) TLB-TSH (Thyroid Stimulating Hormone) (84443-TSH) TLB-Udip ONLY (81003-UDIP)  Problem # 2:  DEPRESSION (ICD-311) mild to mod, liekly situational due to chronic stress related  to husband illness;  declines tx at this time  Problem # 3:  HYPERTENSION (ICD-401.9)  The following medications were removed from the medication list:    Diltiazem Hcl Coated Beads 240 Mg Cp24 (Diltiazem hcl coated beads) .Marland Kitchen... 1 capsule by mouth once a day Her updated medication list for this problem includes:    Lisinopril 20 Mg Tabs (Lisinopril) .Marland Kitchen... 1 by mouth once daily    Diltiazem Hcl Cr 240 Mg Xr24h-cap (Diltiazem hcl) .Marland Kitchen... 1 by mouth once daily  BP today: 120/72 Prior BP: 140/90 (08/03/2008)  Labs Reviewed: K+: 4.1 (07/28/2008) Creat: : 0.8 (07/28/2008)   Chol: 126 (07/28/2008)   HDL: 43.6 (07/28/2008)   LDL: 67 (07/28/2008)   TG: 78 (07/28/2008) stable overall by hx and exam, ok to continue meds/tx as is   Complete Medication List: 1)  Fosamax 70 Mg Tabs (Alendronate sodium) .... Take 1 tablet by mouth once a week 2)  Lisinopril 20 Mg Tabs (Lisinopril) .Marland Kitchen.. 1 by mouth once daily 3)  Ecotrin Low Strength 81 Mg Tbec (Aspirin) .Marland Kitchen.. 1po qd 4)  Pravachol 40 Mg Tabs (Pravastatin sodium) .... 2 by  mouth once daily 5)  Diltiazem Hcl Cr 240 Mg Xr24h-cap (Diltiazem hcl) .Marland Kitchen.. 1 by mouth once daily  Other Orders: T-Bone Densitometry [phone]) T-Vitamin D (25-Hydroxy) [phone])  Patient Instructions: 1)  please schedule the bone density test before leaving today 2)  Please go to the Lab in the basement for your blood and/or urine tests today 3)  Continue all previous medications as before this visit  4)  please call if you change  your mind about the colon testing, or depression treatment 5)  Please schedule a follow-up appointment in 1 year or sooner if needed Prescriptions: DILTIAZEM HCL CR 240 MG XR24H-CAP (DILTIAZEM HCL) 1 by mouth once daily  #90 x 3   Entered and Authorized by:   Corwin Levins MD   Signed by:   Corwin Levins MD on 12/01/2009   Method used:   Print then Give to Patient   RxID:   [id number] DILTIAZEM HCL COATED BEADS 240 MG XR24H-CAP (DILTIAZEM HCL  COATED BEADS) 1 by mouth once daily  #90 x 3   Entered and Authorized by:   Corwin Levins MD   Signed by:   Corwin Levins MD on 12/01/2009   Method used:   Print then Give to Patient   RxID:   [id number] PRAVACHOL 40 MG  TABS (PRAVASTATIN SODIUM) 2 by mouth once daily  #180 x 3   Entered and Authorized by:   Corwin Levins MD   Signed by:   Corwin Levins MD on 12/01/2009   Method used:   Print then Give to Patient   RxID:   [id number] LISINOPRIL 20 MG TABS (LISINOPRIL) 1 by mouth once daily  #90 x 3   Entered and Authorized by:   Corwin Levins MD   Signed by:   Corwin Levins MD on 12/01/2009   Method used:   Print then Give to Patient   RxID:   [id number] FOSAMAX 70 MG TABS (ALENDRONATE SODIUM) Take 1 tablet by mouth once a week  #12 x 3   Entered and Authorized by:   Corwin Levins MD   Signed by:   Corwin Levins MD on 12/01/2009   Method used:   Print then Give to Patient   RxID:   [id number] DILTIAZEM HCL COATED BEADS 240 MG CP24 (DILTIAZEM HCL COATED BEADS) 1 capsule by mouth once a day  #90 x 3   Entered and Authorized by:   Corwin Levins MD   Signed by:   Corwin Levins MD on 12/01/2009   Method used:   Print then Give to Patient   RxID:   [id number]

## 2010-07-10 NOTE — Progress Notes (Signed)
  Phone Note Refill Request  on Oct 10, 2009 9:59 AM  Refills Requested: Medication #1:  DILTIAZEM HCL COATED BEADS 240 MG CP24 1 capsule by mouth once a day   Dosage confirmed as above?Dosage Confirmed Initial call taken by: Scharlene Gloss,  Oct 10, 2009 9:59 AM    Prescriptions: DILTIAZEM HCL COATED BEADS 240 MG XR24H-CAP (DILTIAZEM HCL COATED BEADS) 1 by mouth once daily  #30 x 0   Entered by:   Scharlene Gloss   Authorized by:   Corwin Levins MD   Signed by:   Scharlene Gloss on 10/10/2009   Method used:   Faxed to ...       [facility]  [address] [phone]* (retail)       [address]       [place], [place]  [address]       Ph: [phone]       Fax: [phone]   RxID:   [id number]

## 2010-07-10 NOTE — Assessment & Plan Note (Signed)
Summary: flu vax//jwr  Nurse Visit   Allergies: No Known Drug Allergies  Orders Added: 1)  Admin 1st Vaccine [90471] 2)  Flu Vaccine 32yrs + [65784]               Flu Vaccine Consent Questions     Do you have a history of severe allergic reactions to this vaccine? no    Any prior history of allergic reactions to egg and/or gelatin? no    Do you have a sensitivity to the preservative Thimersol? no    Do you have a past history of Guillan-Barre Syndrome? no    Do you currently have an acute febrile illness? no    Have you ever had a severe reaction to latex? no    Vaccine information given and explained to patient? yes    Are you currently pregnant? no    Lot Number:[id number]   Exp Date:12/08/2010   Site Given  Left Deltoid IMu  Zackery Barefoot CMA  March 07, 2010 4:43 PM

## 2010-07-10 NOTE — Miscellaneous (Signed)
Summary: BONE DENSITY  Clinical Lists Changes  Orders: Added new Test order of T-Lumbar Vertebral Assessment (77082) - Signed 

## 2010-10-26 NOTE — Op Note (Signed)
NAMETALESHA, ELLITHORPE                ACCOUNT NO.:  [id number]   MEDICAL RECORD NO.:  [id number]          PATIENT TYPE:  OIB   LOCATION:  2550                         FACILITY:  [facility]   PHYSICIAN:  Gabrielle Dare. Janee Morn, M.D.DATE OF BIRTH:  [birth year]   DATE OF PROCEDURE:  02/08/2005  DATE OF DISCHARGE:                                 OPERATIVE REPORT   PREOPERATIVE DIAGNOSIS:  Symptomatic cholelithiasis.   POSTOPERATIVE DIAGNOSIS:  Symptomatic cholelithiasis.   PROCEDURE:  Laparoscopic cholecystectomy.   SURGEON:  Violeta Gelinas, M.D.   ASSISTANT:  Cyndia Bent, M.D.   ANESTHESIA:  General.   HISTORY OF PRESENT ILLNESS:  The patient is a 65 year old female who was  evaluated in the emergency department for an episode of right upper quadrant  pain.  At that time, ultrasound was done which demonstrated gallstones.  She  has been on a low-fat diet since that time and has not had any recurrent  attacks.  She presents for elective cholecystectomy   PROCEDURE IN DETAIL:  Informed consent was obtained.  The patient received  intravenous antibiotics.  She was brought to the operating room and general  anesthesia was administered.  Her abdomen was prepped and draped in a  sterile fashion.  0.25% Marcaine with epinephrine was injected under her  umbilicus.  An infraumbilical incision was made and subcutaneous tissues  were dissected down, revealing anterior fascia, which was divided sharply.  The peritoneal cavity was entered under direct vision without difficulty.  A  0 Vicryl pursestring suture was placed around the fascial opening.  The  Hasson trocar was inserted into the abdomen and the abdomen was insufflated  with carbon dioxide.  In a standard fashion under direct vision , an 11-mm  epigastric and two 5-mm lateral ports were placed; 0.25% Marcaine with  epinephrine was used at all port sites.  Exploration revealed some  significant omental adhesions to the dome of the  gallbladder; these were  taken down with the assistance of the Bovie cautery.  Things peeled away  nicely.  Heading towards the infundibulum, the gallbladder was tense though  and a Nezhat aspirator was used; bile aspirated out was clear, consistent  with hydrops of gallbladder.  Once the gallbladder was evacuated, the dome  of the gallbladder was retracted superomedially.  The infundibulum contained  a stone which was pushed somewhat of the way and the infundibulum was  retracted inferolaterally.  Dissection began laterally and progressed  medially.  The common duct was easily visualized; it was close to the  gallbladder.  Dissection continued, identifying a short cystic duct; this  was dissected until a nice window was created between the infundibulum and  the cystic duct and the liver.  The patient's liver function tests were  normal and we felt due to the length of the cystic duct, that it would not  be prudent to proceed with cholangiogram, so this was cancelled.  At this  time, with excellent visualization, 3 clips were placed proximally on the  cystic duct, 1 was placed distally and it was divided.  Further dissection  revealed a  branch of the hepatic artery giving off an anterior and posterior  cystic artery; this was carefully swept away and dissected from the  gallbladder as it was quite close.  The 2 cystic artery branches were  dissected and then both clipped twice proximally, once distally and divided.  The gallbladder was then taken off the liver bed with the Bovie cautery; it  was very edematous and adherent.  Good hemostasis was obtained along the way  using the cautery.  The gallbladder was placed in an EndoCatch bag and  removed from the abdomen via the infraumbilical port site.  The abdomen was  then copiously irrigated and the liver bed was rechecked and hemostasis was  ensured.  A total of 2 L of irrigation fluid were used.  The irrigation  fluid was evacuated and it  was clear.  The liver bed was rechecked and there  was no bleeding.  Ports were removed under direct vision.  The  pneumoperitoneum was released.  The Hasson trocar was removed from the  abdomen.  The infraumbilical port site fascia was closed by tying the 0  Vicryl pursestring suture.  An additional figure-of-eight 0 Vicryl suture  was placed just superior to this as we had to widen the opening a little bit  to get out the gallbladder with its large stones; this gave an excellent  seal.  All four wounds were copiously irrigated.  Some additional Marcaine  was injected and the skin of each was closed with a running 4-0 Vicryl  subcuticular stitch.  Sponge, needle and instrument counts were correct.  Benzoin, Steri-Strips and sterile dressings were applied.  The patient  tolerated procedure well without apparent complication and was taken the  recovery room in stable condition.      Gabrielle Dare Janee Morn, M.D.  Electronically Signed     BET/MEDQ  D:  02/08/2005  T:  02/08/2005  Job:  [id number]   cc:   Corwin Levins, M.D. [facility]  [address]  [place]  [place] [address]

## 2010-11-05 ENCOUNTER — Other Ambulatory Visit: Payer: Self-pay | Admitting: Internal Medicine

## 2010-11-26 ENCOUNTER — Other Ambulatory Visit: Payer: Self-pay | Admitting: Internal Medicine

## 2011-01-10 ENCOUNTER — Other Ambulatory Visit: Payer: Self-pay | Admitting: Internal Medicine

## 2011-01-25 ENCOUNTER — Other Ambulatory Visit: Payer: Self-pay | Admitting: Internal Medicine

## 2011-01-28 ENCOUNTER — Other Ambulatory Visit: Payer: Self-pay | Admitting: Internal Medicine

## 2011-02-27 ENCOUNTER — Other Ambulatory Visit: Payer: Self-pay | Admitting: Dermatology

## 2011-03-01 ENCOUNTER — Other Ambulatory Visit: Payer: Self-pay | Admitting: Internal Medicine

## 2011-03-04 ENCOUNTER — Ambulatory Visit (INDEPENDENT_AMBULATORY_CARE_PROVIDER_SITE_OTHER): Payer: Federal, State, Local not specified - PPO

## 2011-03-04 DIAGNOSIS — Z23 Encounter for immunization: Secondary | ICD-10-CM

## 2011-04-25 ENCOUNTER — Other Ambulatory Visit: Payer: Self-pay | Admitting: Internal Medicine

## 2011-04-27 ENCOUNTER — Other Ambulatory Visit: Payer: Self-pay | Admitting: Internal Medicine

## 2011-05-28 ENCOUNTER — Encounter: Payer: Self-pay | Admitting: Internal Medicine

## 2011-05-28 DIAGNOSIS — Z0000 Encounter for general adult medical examination without abnormal findings: Secondary | ICD-10-CM | POA: Insufficient documentation

## 2011-06-10 ENCOUNTER — Encounter: Payer: Self-pay | Admitting: Internal Medicine

## 2011-07-02 ENCOUNTER — Other Ambulatory Visit: Payer: Self-pay | Admitting: Internal Medicine

## 2011-07-03 ENCOUNTER — Other Ambulatory Visit: Payer: Self-pay | Admitting: Internal Medicine

## 2011-07-22 ENCOUNTER — Other Ambulatory Visit: Payer: Self-pay | Admitting: Internal Medicine

## 2011-08-17 ENCOUNTER — Other Ambulatory Visit: Payer: Self-pay | Admitting: Internal Medicine

## 2011-10-13 ENCOUNTER — Other Ambulatory Visit: Payer: Self-pay | Admitting: Internal Medicine

## 2011-11-05 ENCOUNTER — Other Ambulatory Visit: Payer: Self-pay | Admitting: Internal Medicine

## 2011-12-25 ENCOUNTER — Other Ambulatory Visit: Payer: Self-pay | Admitting: Internal Medicine

## 2012-02-27 ENCOUNTER — Other Ambulatory Visit: Payer: Self-pay | Admitting: Internal Medicine

## 2012-03-03 ENCOUNTER — Ambulatory Visit (INDEPENDENT_AMBULATORY_CARE_PROVIDER_SITE_OTHER): Payer: Federal, State, Local not specified - PPO | Admitting: General Practice

## 2012-03-03 DIAGNOSIS — Z23 Encounter for immunization: Secondary | ICD-10-CM

## 2012-03-09 ENCOUNTER — Other Ambulatory Visit: Payer: Self-pay | Admitting: Internal Medicine

## 2012-03-12 ENCOUNTER — Other Ambulatory Visit: Payer: Self-pay | Admitting: Internal Medicine

## 2012-03-13 ENCOUNTER — Other Ambulatory Visit: Payer: Self-pay | Admitting: Internal Medicine

## 2012-03-18 ENCOUNTER — Other Ambulatory Visit (INDEPENDENT_AMBULATORY_CARE_PROVIDER_SITE_OTHER): Payer: Federal, State, Local not specified - PPO

## 2012-03-18 ENCOUNTER — Ambulatory Visit (INDEPENDENT_AMBULATORY_CARE_PROVIDER_SITE_OTHER): Payer: Federal, State, Local not specified - PPO | Admitting: Internal Medicine

## 2012-03-18 ENCOUNTER — Encounter: Payer: Self-pay | Admitting: Internal Medicine

## 2012-03-18 VITALS — BP 108/78 | HR 72 | Temp 97.4°F | Ht 62.0 in | Wt 222.4 lb

## 2012-03-18 DIAGNOSIS — Z0000 Encounter for general adult medical examination without abnormal findings: Secondary | ICD-10-CM

## 2012-03-18 DIAGNOSIS — F419 Anxiety disorder, unspecified: Secondary | ICD-10-CM | POA: Insufficient documentation

## 2012-03-18 DIAGNOSIS — G43909 Migraine, unspecified, not intractable, without status migrainosus: Secondary | ICD-10-CM | POA: Insufficient documentation

## 2012-03-18 DIAGNOSIS — E785 Hyperlipidemia, unspecified: Secondary | ICD-10-CM | POA: Insufficient documentation

## 2012-03-18 DIAGNOSIS — I10 Essential (primary) hypertension: Secondary | ICD-10-CM | POA: Insufficient documentation

## 2012-03-18 DIAGNOSIS — F329 Major depressive disorder, single episode, unspecified: Secondary | ICD-10-CM | POA: Insufficient documentation

## 2012-03-18 DIAGNOSIS — M8580 Other specified disorders of bone density and structure, unspecified site: Secondary | ICD-10-CM | POA: Insufficient documentation

## 2012-03-18 DIAGNOSIS — F32A Depression, unspecified: Secondary | ICD-10-CM | POA: Insufficient documentation

## 2012-03-18 LAB — CBC WITH DIFFERENTIAL/PLATELET
Basophils Absolute: 0 10*3/uL (ref 0.0–0.1)
Basophils Relative: 0.2 % (ref 0.0–3.0)
Eosinophils Absolute: 0.2 10*3/uL (ref 0.0–0.7)
Lymphocytes Relative: 25.7 % (ref 12.0–46.0)
MCHC: 32.6 g/dL (ref 30.0–36.0)
Monocytes Relative: 9.7 % (ref 3.0–12.0)
Neutrophils Relative %: 62 % (ref 43.0–77.0)
RBC: 4.69 Mil/uL (ref 3.87–5.11)
RDW: 13.5 % (ref 11.5–14.6)

## 2012-03-18 LAB — URINALYSIS, ROUTINE W REFLEX MICROSCOPIC
Hgb urine dipstick: NEGATIVE
Nitrite: NEGATIVE
Urobilinogen, UA: 0.2 (ref 0.0–1.0)

## 2012-03-18 LAB — HEPATIC FUNCTION PANEL
ALT: 25 U/L (ref 0–35)
Albumin: 4.2 g/dL (ref 3.5–5.2)
Bilirubin, Direct: 0.1 mg/dL (ref 0.0–0.3)
Total Protein: 8.2 g/dL (ref 6.0–8.3)

## 2012-03-18 LAB — LIPID PANEL
LDL Cholesterol: 102 mg/dL — ABNORMAL HIGH (ref 0–99)
VLDL: 24.6 mg/dL (ref 0.0–40.0)

## 2012-03-18 LAB — BASIC METABOLIC PANEL
BUN: 14 mg/dL (ref 6–23)
CO2: 26 mEq/L (ref 19–32)
Creatinine, Ser: 0.7 mg/dL (ref 0.4–1.2)
GFR: 97.28 mL/min (ref >60.00)
Potassium: 3.8 mEq/L (ref 3.5–5.1)
Sodium: 137 mEq/L (ref 135–145)

## 2012-03-18 MED ORDER — PRAVASTATIN SODIUM 40 MG PO TABS: 40.0000 mg | ORAL_TABLET | Freq: Every day | ORAL | Status: DC

## 2012-03-18 MED ORDER — DILTIAZEM HCL ER BEADS 240 MG PO CP24: 240.0000 mg | ORAL_CAPSULE | Freq: Every day | ORAL | Status: DC

## 2012-03-18 MED ORDER — LISINOPRIL 20 MG PO TABS: 20.0000 mg | ORAL_TABLET | Freq: Every day | ORAL | Status: DC

## 2012-03-18 NOTE — Patient Instructions (Addendum)
Please make a nurse visit if you change your mind about the pneumonia shot Please go to LAB in the Basement for the blood and/or urine tests to be done today, including the vitamin D You will be contacted by phone if any changes need to be made immediately.  Otherwise, you will receive a letter about your results with an explanation. Please remember to sign up for My Chart at your earliest convenience, as this will be important to you in the future with finding out test results. OK to stop the fosamax (alendronate) as it has been more than 5 yrs We can plan on a repeat bone density next year Continue all other medications as before; your refills were done today Please remember to followup with your GYN for the yearly pap smear and/or mammogram Please call if you change your mind about having the screening colonoscopy Please return in 1 year for your yearly visit, or sooner if needed

## 2012-03-18 NOTE — Assessment & Plan Note (Addendum)
Overall doing well, age appropriate education and counseling updated, referrals for preventative services and immunizations addressed, dietary and smoking counseling addressed, most recent labs and ECG reviewed.  I have personally reviewed and have noted: 1) the patient's medical and social history 2) The pt's use of alcohol, tobacco, and illicit drugs 3) The patient's current medications and supplements 4) Functional ability including ADL's, fall risk, home safety risk, hearing and visual impairment 5) Diet and physical activities 6) Evidence for depression or mood disorder 7) The patient's height, weight, and BMI have been recorded in the chart I have made referrals, and provided counseling and education based on review of the above Declines colonoscopy, and declines pneumovax

## 2012-03-18 NOTE — Progress Notes (Signed)
  Subjective:    Patient ID: Bridget Grant, female    DOB: 08-29-1945, 66 y.o.   MRN: [id number]  HPI  Here for wellness and f/u;  Overall doing ok;  Pt denies CP, worsening SOB, DOE, wheezing, orthopnea, PND, worsening LE edema, palpitations, dizziness or syncope.  Pt denies neurological change such as new Headache, facial or extremity weakness.  Pt denies polydipsia, polyuria, or low sugar symptoms. Pt states overall good compliance with treatment and medications, good tolerability, and trying to follow lower cholesterol diet.  Pt denies worsening depressive symptoms, suicidal ideation or panic. No fever, wt loss, night sweats, loss of appetite, or other constitutional symptoms.  Pt states good ability with ADL's, low fall risk, home safety reviewed and adequate, no significant changes in hearing or vision, and occasionally active with exercise.  No acute complaints     Review of Systems     Objective:   Physical Exam        Assessment & Plan:

## 2013-03-05 ENCOUNTER — Other Ambulatory Visit: Payer: Self-pay | Admitting: Dermatology

## 2013-03-05 DIAGNOSIS — I8310 Varicose veins of unspecified lower extremity with inflammation: Secondary | ICD-10-CM | POA: Diagnosis not present

## 2013-03-05 DIAGNOSIS — D485 Neoplasm of uncertain behavior of skin: Secondary | ICD-10-CM | POA: Diagnosis not present

## 2013-03-05 DIAGNOSIS — D1801 Hemangioma of skin and subcutaneous tissue: Secondary | ICD-10-CM | POA: Diagnosis not present

## 2013-03-05 DIAGNOSIS — L821 Other seborrheic keratosis: Secondary | ICD-10-CM | POA: Diagnosis not present

## 2013-03-05 DIAGNOSIS — D239 Other benign neoplasm of skin, unspecified: Secondary | ICD-10-CM | POA: Diagnosis not present

## 2013-03-11 ENCOUNTER — Other Ambulatory Visit: Payer: Self-pay | Admitting: Internal Medicine

## 2013-03-24 ENCOUNTER — Encounter: Payer: Federal, State, Local not specified - PPO | Admitting: Internal Medicine

## 2013-04-06 ENCOUNTER — Other Ambulatory Visit (INDEPENDENT_AMBULATORY_CARE_PROVIDER_SITE_OTHER): Payer: Medicare Other

## 2013-04-06 ENCOUNTER — Ambulatory Visit (INDEPENDENT_AMBULATORY_CARE_PROVIDER_SITE_OTHER): Payer: Federal, State, Local not specified - PPO | Admitting: Internal Medicine

## 2013-04-06 ENCOUNTER — Encounter: Payer: Self-pay | Admitting: Internal Medicine

## 2013-04-06 VITALS — BP 140/64 | HR 73 | Temp 97.0°F | Ht 62.0 in | Wt 220.0 lb

## 2013-04-06 DIAGNOSIS — I10 Essential (primary) hypertension: Secondary | ICD-10-CM | POA: Diagnosis not present

## 2013-04-06 DIAGNOSIS — Z23 Encounter for immunization: Secondary | ICD-10-CM

## 2013-04-06 DIAGNOSIS — E785 Hyperlipidemia, unspecified: Secondary | ICD-10-CM

## 2013-04-06 DIAGNOSIS — F3289 Other specified depressive episodes: Secondary | ICD-10-CM | POA: Diagnosis not present

## 2013-04-06 DIAGNOSIS — F329 Major depressive disorder, single episode, unspecified: Secondary | ICD-10-CM

## 2013-04-06 LAB — URINALYSIS, ROUTINE W REFLEX MICROSCOPIC
Bilirubin Urine: NEGATIVE
Hgb urine dipstick: NEGATIVE
Specific Gravity, Urine: 1.015 (ref 1.000–1.030)
Total Protein, Urine: NEGATIVE
Urine Glucose: NEGATIVE
Urobilinogen, UA: 0.2 (ref 0.0–1.0)

## 2013-04-06 LAB — CBC WITH DIFFERENTIAL/PLATELET
Basophils Absolute: 0 10*3/uL (ref 0.0–0.1)
Basophils Relative: 0 % (ref 0.0–3.0)
Eosinophils Absolute: 0.3 10*3/uL (ref 0.0–0.7)
MCHC: 34.1 g/dL (ref 30.0–36.0)
MCV: 95 fl (ref 78.0–100.0)
Monocytes Absolute: 0.9 10*3/uL (ref 0.1–1.0)
Monocytes Relative: 8.8 % (ref 3.0–12.0)
Neutrophils Relative %: 59.2 % (ref 43.0–77.0)
Platelets: 227 10*3/uL (ref 150.0–400.0)
RBC: 4.46 Mil/uL (ref 3.87–5.11)
RDW: 13.6 % (ref 11.5–14.6)
WBC: 10.5 10*3/uL (ref 4.5–10.5)

## 2013-04-06 MED ORDER — ASPIRIN EC 81 MG PO TBEC: 81.0000 mg | DELAYED_RELEASE_TABLET | Freq: Every day | ORAL | Status: DC

## 2013-04-06 MED ORDER — DILTIAZEM HCL ER COATED BEADS 240 MG PO CP24: 240.0000 mg | ORAL_CAPSULE | Freq: Every day | ORAL | Status: DC

## 2013-04-06 MED ORDER — LISINOPRIL 20 MG PO TABS: 20.0000 mg | ORAL_TABLET | Freq: Every day | ORAL | Status: DC

## 2013-04-06 MED ORDER — PRAVASTATIN SODIUM 40 MG PO TABS: 40.0000 mg | ORAL_TABLET | Freq: Every day | ORAL | Status: DC

## 2013-04-06 NOTE — Assessment & Plan Note (Signed)
stable overall by history and exam, recent data reviewed with pt, and pt to continue medical treatment as before,  to f/u any worsening symptoms or concerns BP Readings from Last 3 Encounters:  04/06/13 140/64  03/18/12 108/78  04/05/10 110/80

## 2013-04-06 NOTE — Assessment & Plan Note (Addendum)
stable overall by history and exam, recent data reviewed with pt, and pt to continue medical treatment as before,  to f/u any worsening symptoms or concerns Lab Results  Component Value Date   LDLCALC 102* 03/18/2012

## 2013-04-06 NOTE — Progress Notes (Signed)
  Subjective:    Patient ID: Bridget Grant, female    DOB: 11/21/1945, 67 y.o.   MRN: [id number]  HPI  Here to f/u; overall doing ok,  Pt denies chest pain, increased sob or doe, wheezing, orthopnea, PND, increased LE swelling, palpitations, dizziness or syncope.  Pt denies polydipsia, polyuria, or low sugar symptoms such as weakness or confusion improved with po intake.  Pt denies new neurological symptoms such as new headache, or facial or extremity weakness or numbness.   Pt states overall good compliance with meds, has been trying to follow lower cholesterol diet, with wt overall stable,  but little exercise however. Denies worsening depressive symptoms, suicidal ideation, or panic; has ongoing anxiety, not increased recently. Declines immunizations Past Medical History  Diagnosis Date  . Hypertension   . Osteopenia   . Depression   . Anxiety   . Hyperlipidemia   . Migraine    Past Surgical History  Procedure Laterality Date  . Abdominal hysterectomy      reports that she has never smoked. She has never used smokeless tobacco. She reports that she does not drink alcohol or use illicit drugs. family history includes Cancer in her mother; Hypertension in her father. No Known Allergies Current Outpatient Prescriptions on File Prior to Visit  Medication Sig Dispense Refill  . [DISCONTINUED] diltiazem (TIAZAC) 240 MG 24 hr capsule Take 1 capsule (240 mg total) by mouth daily.  90 capsule  3   No current facility-administered medications on file prior to visit.    Review of Systems  Constitutional: Negative for unexpected weight change, or unusual diaphoresis  HENT: Negative for tinnitus.   Eyes: Negative for photophobia and visual disturbance.  Respiratory: Negative for choking and stridor.   Gastrointestinal: Negative for vomiting and blood in stool.  Genitourinary: Negative for hematuria and decreased urine volume.  Musculoskeletal: Negative for acute joint swelling Skin: Negative  for color change and wound.  Neurological: Negative for tremors and numbness other than noted  Psychiatric/Behavioral: Negative for decreased concentration or  hyperactivity.       Objective:   Physical Exam BP 140/64  Pulse 73  Temp(Src) 97 F (36.1 C) (Oral)  Ht 5\' 2"  (1.575 m)  Wt 220 lb (99.791 kg)  BMI 40.23 kg/m2  SpO2 95% VS noted,  Constitutional: Pt appears well-developed and well-nourished.  HENT: Head: NCAT.  Right Ear: External ear normal.  Left Ear: External ear normal.  Eyes: Conjunctivae and EOM are normal. Pupils are equal, round, and reactive to light.  Neck: Normal range of motion. Neck supple.  Cardiovascular: Normal rate and regular rhythm.   Pulmonary/Chest: Effort normal and breath sounds normal.  Abd:  Soft, NT, non-distended, + BS Neurological: Pt is alert. Not confused  Skin: Skin is warm. No erythema.  Psychiatric: Pt behavior is normal. Thought content normal.     Assessment & Plan:

## 2013-04-06 NOTE — Patient Instructions (Addendum)
Please remember to followup with your GYN for the yearly pap smear and/or mammogram You had the flu shot today Please go to the LAB in the Basement (turn left off the elevator) for the tests to be done today You will be contacted by phone if any changes need to be made immediately.  Otherwise, you will receive a letter about your results with an explanation, but please check with MyChart first. Please continue your efforts at being more active, low cholesterol diet, and weight control. You are otherwise up to date with prevention measures today.  Please return in 1 year for your yearly visit, or sooner if needed

## 2013-04-06 NOTE — Assessment & Plan Note (Signed)
stable overall by history and exam, recent data reviewed with pt, and pt to continue medical treatment as before,  to f/u any worsening symptoms or concerns Lab Results  Component Value Date   WBC 9.6 03/18/2012   HGB 15.0 03/18/2012   HCT 46.0 03/18/2012   PLT 224.0 03/18/2012   GLUCOSE 102* 03/18/2012   CHOL 175 03/18/2012   TRIG 123.0 03/18/2012   HDL 48.00 03/18/2012   LDLDIRECT 141.1 07/08/2007   LDLCALC 102* 03/18/2012   ALT 25 03/18/2012   AST 26 03/18/2012   NA 137 03/18/2012   K 3.8 03/18/2012   CL 101 03/18/2012   CREATININE 0.7 03/18/2012   BUN 14 03/18/2012   CO2 26 03/18/2012   TSH 1.87 03/18/2012

## 2013-04-07 ENCOUNTER — Encounter: Payer: Self-pay | Admitting: Internal Medicine

## 2013-04-07 ENCOUNTER — Ambulatory Visit: Payer: Federal, State, Local not specified - PPO

## 2013-04-07 DIAGNOSIS — R7309 Other abnormal glucose: Secondary | ICD-10-CM

## 2013-04-07 LAB — TSH: TSH: 1.46 u[IU]/mL (ref 0.35–5.50)

## 2013-04-07 LAB — BASIC METABOLIC PANEL
CO2: 31 mEq/L (ref 19–32)
Chloride: 99 mEq/L (ref 96–112)
Potassium: 4.1 mEq/L (ref 3.5–5.1)
Sodium: 136 mEq/L (ref 135–145)

## 2013-04-07 LAB — LIPID PANEL
LDL Cholesterol: 91 mg/dL (ref 0–99)
Total CHOL/HDL Ratio: 3
Triglycerides: 118 mg/dL (ref 0.0–149.0)

## 2013-04-07 LAB — HEPATIC FUNCTION PANEL
ALT: 28 U/L (ref 0–35)
AST: 28 U/L (ref 0–37)
Alkaline Phosphatase: 63 U/L (ref 39–117)
Bilirubin, Direct: 0.1 mg/dL (ref 0.0–0.3)
Total Protein: 7.7 g/dL (ref 6.0–8.3)

## 2013-04-07 LAB — HEMOGLOBIN A1C: Hgb A1c MFr Bld: 7.9 % — ABNORMAL HIGH (ref 4.6–6.5)

## 2013-04-09 ENCOUNTER — Telehealth: Payer: Self-pay | Admitting: Internal Medicine

## 2013-04-09 ENCOUNTER — Other Ambulatory Visit: Payer: Self-pay | Admitting: Internal Medicine

## 2013-04-09 ENCOUNTER — Encounter: Payer: Self-pay | Admitting: Internal Medicine

## 2013-04-09 DIAGNOSIS — E119 Type 2 diabetes mellitus without complications: Secondary | ICD-10-CM | POA: Insufficient documentation

## 2013-04-09 DIAGNOSIS — IMO0001 Reserved for inherently not codable concepts without codable children: Secondary | ICD-10-CM

## 2013-04-09 HISTORY — DX: Reserved for inherently not codable concepts without codable children: IMO0001

## 2013-04-09 MED ORDER — METFORMIN HCL ER 500 MG PO TB24: 500.0000 mg | ORAL_TABLET | Freq: Every day | ORAL | Status: DC

## 2013-04-09 NOTE — Telephone Encounter (Signed)
Message copied by Corwin Levins on Fri Apr 09, 2013  4:41 PM ------      Message from: Scharlene Gloss B      Created: Fri Apr 09, 2013  1:25 PM       Called the patient informed of results and Harriett Sine did schedule an appointment for this patient in 6 months.  The patient did agree to taking the new medication.   Please refer to diabetes education class. ------

## 2013-10-05 ENCOUNTER — Other Ambulatory Visit (INDEPENDENT_AMBULATORY_CARE_PROVIDER_SITE_OTHER): Payer: Medicare Other

## 2013-10-05 ENCOUNTER — Encounter: Payer: Self-pay | Admitting: Internal Medicine

## 2013-10-05 ENCOUNTER — Ambulatory Visit (INDEPENDENT_AMBULATORY_CARE_PROVIDER_SITE_OTHER): Payer: Medicare Other | Admitting: Internal Medicine

## 2013-10-05 VITALS — BP 132/70 | HR 86 | Temp 98.6°F | Ht 62.0 in | Wt 194.8 lb

## 2013-10-05 DIAGNOSIS — IMO0001 Reserved for inherently not codable concepts without codable children: Secondary | ICD-10-CM

## 2013-10-05 DIAGNOSIS — E1165 Type 2 diabetes mellitus with hyperglycemia: Secondary | ICD-10-CM

## 2013-10-05 DIAGNOSIS — Z0000 Encounter for general adult medical examination without abnormal findings: Secondary | ICD-10-CM | POA: Diagnosis not present

## 2013-10-05 DIAGNOSIS — E785 Hyperlipidemia, unspecified: Secondary | ICD-10-CM

## 2013-10-05 LAB — URINALYSIS, ROUTINE W REFLEX MICROSCOPIC
BILIRUBIN URINE: NEGATIVE
HGB URINE DIPSTICK: NEGATIVE
Ketones, ur: NEGATIVE
Leukocytes, UA: NEGATIVE
Nitrite: NEGATIVE
RBC / HPF: NONE SEEN (ref 0–?)
Specific Gravity, Urine: 1.01 (ref 1.000–1.030)
Total Protein, Urine: NEGATIVE
UROBILINOGEN UA: 0.2 (ref 0.0–1.0)
Urine Glucose: NEGATIVE
pH: 6 (ref 5.0–8.0)

## 2013-10-05 LAB — LIPID PANEL
CHOLESTEROL: 174 mg/dL (ref 0–200)
HDL: 47.4 mg/dL (ref >39.00)
LDL Cholesterol: 103 mg/dL — ABNORMAL HIGH (ref 0–99)
Total CHOL/HDL Ratio: 4
Triglycerides: 116 mg/dL (ref 0.0–149.0)
VLDL: 23.2 mg/dL (ref 0.0–40.0)

## 2013-10-05 LAB — HEPATIC FUNCTION PANEL
ALK PHOS: 56 U/L (ref 39–117)
ALT: 18 U/L (ref 0–35)
AST: 18 U/L (ref 0–37)
Albumin: 4.3 g/dL (ref 3.5–5.2)
BILIRUBIN DIRECT: 0 mg/dL (ref 0.0–0.3)
TOTAL PROTEIN: 7.6 g/dL (ref 6.0–8.3)
Total Bilirubin: 0.4 mg/dL (ref 0.3–1.2)

## 2013-10-05 LAB — BASIC METABOLIC PANEL
BUN: 17 mg/dL (ref 6–23)
CALCIUM: 9.6 mg/dL (ref 8.4–10.5)
CO2: 28 meq/L (ref 19–32)
Chloride: 101 mEq/L (ref 96–112)
Creatinine, Ser: 0.7 mg/dL (ref 0.4–1.2)
GFR: 96.81 mL/min (ref >60.00)
Glucose, Bld: 98 mg/dL (ref 70–99)
POTASSIUM: 4.6 meq/L (ref 3.5–5.1)
SODIUM: 138 meq/L (ref 135–145)

## 2013-10-05 LAB — CBC WITH DIFFERENTIAL/PLATELET
BASOS ABS: 0 10*3/uL (ref 0.0–0.1)
Basophils Relative: 0.4 % (ref 0.0–3.0)
EOS ABS: 0.3 10*3/uL (ref 0.0–0.7)
Eosinophils Relative: 2.9 % (ref 0.0–5.0)
HCT: 43.7 % (ref 36.0–46.0)
Hemoglobin: 14.7 g/dL (ref 12.0–15.0)
Lymphocytes Relative: 26.5 % (ref 12.0–46.0)
Lymphs Abs: 3 10*3/uL (ref 0.7–4.0)
MCHC: 33.7 g/dL (ref 30.0–36.0)
MCV: 95.6 fl (ref 78.0–100.0)
MONO ABS: 0.9 10*3/uL (ref 0.1–1.0)
Monocytes Relative: 8.3 % (ref 3.0–12.0)
NEUTROS PCT: 61.9 % (ref 43.0–77.0)
Neutro Abs: 7 10*3/uL (ref 1.4–7.7)
PLATELETS: 245 10*3/uL (ref 150.0–400.0)
RBC: 4.57 Mil/uL (ref 3.87–5.11)
RDW: 13.5 % (ref 11.5–14.6)
WBC: 11.3 10*3/uL — ABNORMAL HIGH (ref 4.5–10.5)

## 2013-10-05 LAB — MICROALBUMIN / CREATININE URINE RATIO
CREATININE, U: 46 mg/dL
MICROALB/CREAT RATIO: 0.4 mg/g (ref 0.0–30.0)
Microalb, Ur: 0.2 mg/dL (ref 0.0–1.9)

## 2013-10-05 LAB — TSH: TSH: 1.33 u[IU]/mL (ref 0.35–5.50)

## 2013-10-05 LAB — HEMOGLOBIN A1C: HEMOGLOBIN A1C: 5.8 % (ref 4.6–6.5)

## 2013-10-05 NOTE — Progress Notes (Signed)
Subjective:    Patient ID: Bridget Grant, female    DOB: [birth year], 68 y.o.   MRN: [id number]  HPI  Here for wellness and f/u;  Overall doing ok;  Pt denies CP, worsening SOB, DOE, wheezing, orthopnea, PND, worsening LE edema, palpitations, dizziness or syncope.  Pt denies neurological change such as new headache, facial or extremity weakness.  Pt denies polydipsia, polyuria, or low sugar symptoms. Pt states overall good compliance with treatment and medications, good tolerability, and has been trying to follow lower cholesterol diet.  Pt denies worsening depressive symptoms, suicidal ideation or panic. No fever, night sweats, wt loss, loss of appetite, or other constitutional symptoms.  Pt states good ability with ADL's, has low fall risk, home safety reviewed and adequate, no other significant changes in hearing or vision, and only occasionally active with exercise.  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 Has lost wt with better diet - almost 30 lbs since last visit intentionally Past Medical History  Diagnosis Date  . Hypertension   . Osteopenia   . Depression   . Anxiety   . Hyperlipidemia   . Migraine   . Type II or unspecified type diabetes mellitus without mention of complication, uncontrolled 04/09/2013   Past Surgical History  Procedure Laterality Date  . Abdominal hysterectomy      reports that she has never smoked. She has never used smokeless tobacco. She reports that she does not drink alcohol or use illicit drugs. family history includes Cancer in her mother; Hypertension in her father. No Known Allergies Current Outpatient Prescriptions on File Prior to Visit  Medication Sig Dispense Refill  . aspirin EC 81 MG tablet Take 1 tablet (81 mg total) by mouth daily.  90 tablet  11  . diltiazem (CARDIZEM CD) 240 MG 24 hr capsule Take 1 capsule (240 mg total) by mouth daily.  90 capsule  3  . lisinopril (PRINIVIL,ZESTRIL) 20 MG tablet Take  1 tablet (20 mg total) by mouth daily.  90 tablet  3  . metFORMIN (GLUCOPHAGE-XR) 500 MG 24 hr tablet Take 1 tablet (500 mg total) by mouth daily with breakfast.  90 tablet  3  . pravastatin (PRAVACHOL) 40 MG tablet Take 1 tablet (40 mg total) by mouth daily.  90 tablet  3  . [DISCONTINUED] diltiazem (TIAZAC) 240 MG 24 hr capsule Take 1 capsule (240 mg total) by mouth daily.  90 capsule  3   No current facility-administered medications on file prior to visit.   Review of Systems Constitutional: Negative for increased diaphoresis, other activity, appetite or other siginficant weight change  HENT: Negative for worsening hearing loss, ear pain, facial swelling, mouth sores and neck stiffness.   Eyes: Negative for other worsening pain, redness or visual disturbance.  Respiratory: Negative for shortness of breath and wheezing.   Cardiovascular: Negative for chest pain and palpitations.  Gastrointestinal: Negative for diarrhea, blood in stool, abdominal distention or other pain Genitourinary: Negative for hematuria, flank pain or change in urine volume.  Musculoskeletal: Negative for myalgias or other joint complaints.  Skin: Negative for color change and wound.  Neurological: Negative for syncope and numbness. other than noted Hematological: Negative for adenopathy. or other swelling Psychiatric/Behavioral: Negative for hallucinations, self-injury, decreased concentration or other worsening agitation.      Objective:   Physical Exam BP 132/70  Pulse 86  Temp(Src) 98.6 F (37 C) (Oral)  Ht 5\' 2"  (1.575 m)  Wt  194 lb 12 oz (88.338 kg)  BMI 35.61 kg/m2  SpO2 97% VS noted,  Constitutional: Pt is oriented to person, place, and time. Appears well-developed and well-nourished.  Head: Normocephalic and atraumatic.  Right Ear: External ear normal.  Left Ear: External ear normal.  Nose: Nose normal.  Mouth/Throat: Oropharynx is clear and moist.  Eyes: Conjunctivae and EOM are normal. Pupils  are equal, round, and reactive to light.  Neck: Normal range of motion. Neck supple. No JVD present. No tracheal deviation present.  Cardiovascular: Normal rate, regular rhythm, normal heart sounds and intact distal pulses.   Pulmonary/Chest: Effort normal and breath sounds without rales or wheezing  Abdominal: Soft. Bowel sounds are normal. NT. No HSM  Musculoskeletal: Normal range of motion. Exhibits no edema.  Lymphadenopathy:  Has no cervical adenopathy.  Neurological: Pt is alert and oriented to person, place, and time. Pt has normal reflexes. No cranial nerve deficit. Motor grossly intact Skin: Skin is warm and dry. No rash noted.  Psychiatric:  Has somewhat dysphoric mood and affect. Behavior is normal.     Assessment & Plan:

## 2013-10-05 NOTE — Progress Notes (Signed)
Pre visit review using our clinic review tool, if applicable. No additional management support is needed unless otherwise documented below in the visit note. 

## 2013-10-05 NOTE — Assessment & Plan Note (Signed)

## 2013-10-05 NOTE — Assessment & Plan Note (Signed)
With large recent intentional wt loss, likely to be able to come off the metformin, for a1c

## 2013-10-05 NOTE — Patient Instructions (Signed)
Please continue all other medications as before, and refills have been done if requested. Please have the pharmacy call with any other refills you may need.  Please continue your efforts at being more active, low cholesterol diet, and weight control.  You are otherwise up to date with prevention measures today.  Please go to the LAB in the Basement (turn left off the elevator) for the tests to be done today  You will be contacted by phone if any changes need to be made immediately.  Otherwise, you will receive a letter about your results with an explanation, but please check with MyChart first.  Please keep your appointments with your specialists as you have planned  Please remember to sign up for MyChart if you have not done so, as this will be important to you in the future with finding out test results, communicating by private email, and scheduling acute appointments online when needed.  Please return in 6 months, or sooner if needed 

## 2013-10-06 ENCOUNTER — Encounter: Payer: Self-pay | Admitting: Internal Medicine

## 2013-10-06 ENCOUNTER — Other Ambulatory Visit: Payer: Self-pay | Admitting: Internal Medicine

## 2013-11-30 ENCOUNTER — Telehealth: Payer: Self-pay | Admitting: *Deleted

## 2013-11-30 NOTE — Telephone Encounter (Signed)
Left msg on triage stating wife got  Bill from 10/05/13 visit requesting call abck. Call pt back spoke with husband gave him the billing dept # [phone]...Bridget Grant

## 2014-02-24 ENCOUNTER — Ambulatory Visit (INDEPENDENT_AMBULATORY_CARE_PROVIDER_SITE_OTHER): Payer: Medicare Other

## 2014-02-24 DIAGNOSIS — Z23 Encounter for immunization: Secondary | ICD-10-CM | POA: Diagnosis not present

## 2014-03-15 ENCOUNTER — Emergency Department (HOSPITAL_COMMUNITY)
Admission: EM | Admit: 2014-03-15 | Discharge: 2014-03-15 | Disposition: A | Discharge status: Home / Self Care | Payer: Medicare Other | Attending: Emergency Medicine | Admitting: Emergency Medicine

## 2014-03-15 ENCOUNTER — Encounter (HOSPITAL_COMMUNITY): Payer: Self-pay | Admitting: Emergency Medicine

## 2014-03-15 DIAGNOSIS — I10 Essential (primary) hypertension: Secondary | ICD-10-CM | POA: Diagnosis not present

## 2014-03-15 DIAGNOSIS — M7989 Other specified soft tissue disorders: Secondary | ICD-10-CM

## 2014-03-15 DIAGNOSIS — G43909 Migraine, unspecified, not intractable, without status migrainosus: Secondary | ICD-10-CM | POA: Insufficient documentation

## 2014-03-15 DIAGNOSIS — M79662 Pain in left lower leg: Secondary | ICD-10-CM | POA: Diagnosis not present

## 2014-03-15 DIAGNOSIS — Z79899 Other long term (current) drug therapy: Secondary | ICD-10-CM | POA: Insufficient documentation

## 2014-03-15 DIAGNOSIS — E1165 Type 2 diabetes mellitus with hyperglycemia: Secondary | ICD-10-CM | POA: Insufficient documentation

## 2014-03-15 DIAGNOSIS — Z7982 Long term (current) use of aspirin: Secondary | ICD-10-CM | POA: Insufficient documentation

## 2014-03-15 DIAGNOSIS — Z8659 Personal history of other mental and behavioral disorders: Secondary | ICD-10-CM | POA: Insufficient documentation

## 2014-03-15 DIAGNOSIS — R609 Edema, unspecified: Secondary | ICD-10-CM | POA: Diagnosis not present

## 2014-03-15 DIAGNOSIS — E785 Hyperlipidemia, unspecified: Secondary | ICD-10-CM | POA: Diagnosis not present

## 2014-03-15 DIAGNOSIS — R600 Localized edema: Secondary | ICD-10-CM | POA: Insufficient documentation

## 2014-03-15 DIAGNOSIS — Z8739 Personal history of other diseases of the musculoskeletal system and connective tissue: Secondary | ICD-10-CM | POA: Diagnosis not present

## 2014-03-15 DIAGNOSIS — M79609 Pain in unspecified limb: Secondary | ICD-10-CM | POA: Diagnosis not present

## 2014-03-15 LAB — CBC WITH DIFFERENTIAL/PLATELET
Basophils Absolute: 0 10*3/uL (ref 0.0–0.1)
Basophils Relative: 0 % (ref 0–1)
EOS ABS: 0.2 10*3/uL (ref 0.0–0.7)
Eosinophils Relative: 2 % (ref 0–5)
HEMATOCRIT: 44.8 % (ref 36.0–46.0)
HEMOGLOBIN: 15.3 g/dL — AB (ref 12.0–15.0)
LYMPHS ABS: 2.7 10*3/uL (ref 0.7–4.0)
Lymphocytes Relative: 26 % (ref 12–46)
MCH: 32.7 pg (ref 26.0–34.0)
MCHC: 34.2 g/dL (ref 30.0–36.0)
MCV: 95.7 fL (ref 78.0–100.0)
MONOS PCT: 12 % (ref 3–12)
Monocytes Absolute: 1.3 10*3/uL — ABNORMAL HIGH (ref 0.1–1.0)
NEUTROS ABS: 6.3 10*3/uL (ref 1.7–7.7)
Neutrophils Relative %: 60 % (ref 43–77)
Platelets: 216 10*3/uL (ref 150–400)
RBC: 4.68 MIL/uL (ref 3.87–5.11)
RDW: 13.1 % (ref 11.5–15.5)
WBC: 10.5 10*3/uL (ref 4.0–10.5)

## 2014-03-15 LAB — COMPREHENSIVE METABOLIC PANEL
ALK PHOS: 62 U/L (ref 39–117)
ALT: 16 U/L (ref 0–35)
ANION GAP: 13 (ref 5–15)
AST: 19 U/L (ref 0–37)
Albumin: 4.3 g/dL (ref 3.5–5.2)
BUN: 14 mg/dL (ref 6–23)
CHLORIDE: 101 meq/L (ref 96–112)
CO2: 26 mEq/L (ref 19–32)
Calcium: 9.8 mg/dL (ref 8.4–10.5)
Creatinine, Ser: 0.61 mg/dL (ref 0.50–1.10)
GFR calc non Af Amer: >90 mL/min (ref >90)
GLUCOSE: 94 mg/dL (ref 70–99)
POTASSIUM: 4.3 meq/L (ref 3.7–5.3)
Sodium: 140 mEq/L (ref 137–147)
Total Bilirubin: 0.3 mg/dL (ref 0.3–1.2)
Total Protein: 8.2 g/dL (ref 6.0–8.3)

## 2014-03-15 MED ORDER — TRAMADOL HCL 50 MG PO TABS: 50.0000 mg | ORAL_TABLET | Freq: Four times a day (QID) | ORAL | Status: DC | PRN

## 2014-03-15 MED ORDER — FUROSEMIDE 20 MG PO TABS
40.0000 mg | ORAL_TABLET | Freq: Once | ORAL | Status: AC
  Administered 2014-03-15: 40 mg via ORAL
  Filled 2014-03-15: qty 2

## 2014-03-15 MED ORDER — FUROSEMIDE 20 MG PO TABS: 20.0000 mg | ORAL_TABLET | Freq: Every day | ORAL | Status: DC

## 2014-03-15 NOTE — ED Notes (Signed)
To us

## 2014-03-15 NOTE — Progress Notes (Signed)
VASCULAR LAB PRELIMINARY  PRELIMINARY  PRELIMINARY  PRELIMINARY  Left lower extremity venous duplex completed.    Preliminary report:  Left:  No evidence of DVT, superficial thrombosis, or Baker's cyst.  Nayleen Janosik, RVS 03/15/2014, 5:46 PM

## 2014-03-15 NOTE — ED Notes (Signed)
Family just returned from vascular.

## 2014-03-15 NOTE — ED Provider Notes (Signed)
CSN: [id number]     Arrival date & time 03/15/14  1411 History   First MD Initiated Contact with Patient 03/15/14 1631     Chief Complaint  Patient presents with  . Knee Pain  . Leg Swelling     (Consider location/radiation/quality/duration/timing/severity/associated sxs/prior Treatment) The history is provided by the patient.  DONNARAE RAE is a 68 y.o. female hx of HTN, DM here with leg swelling. Bilateral leg swelling that is worse when she stands up. It improved with laying down. She noticed that for the last few days, her left leg is more swollen compared to the right. Has more pain behind L knee. Denies fall or injury or hx of DVT or PE. No recent travel or shortness of breath or chest pain.    Past Medical History  Diagnosis Date  . Hypertension   . Osteopenia   . Depression   . Anxiety   . Hyperlipidemia   . Migraine   . Type II or unspecified type diabetes mellitus without mention of complication, uncontrolled 04/09/2013   Past Surgical History  Procedure Laterality Date  . Abdominal hysterectomy     Family History  Problem Relation Age of Onset  . Cancer Mother   . Hypertension Father    History  Substance Use Topics  . Smoking status: Never Smoker   . Smokeless tobacco: Never Used  . Alcohol Use: No   OB History   Grav Para Term Preterm Abortions TAB SAB Ect Mult Living                 Review of Systems  Musculoskeletal:       L leg swelling   All other systems reviewed and are negative.     Allergies  Review of patient's allergies indicates no known allergies.  Home Medications   Prior to Admission medications   Medication Sig Start Date End Date Taking? Authorizing Provider  aspirin EC 81 MG tablet Take 81 mg by mouth daily.   Yes Historical Provider, MD  diltiazem (CARDIZEM CD) 240 MG 24 hr capsule Take 240 mg by mouth daily.   Yes Historical Provider, MD  lisinopril (PRINIVIL,ZESTRIL) 20 MG tablet Take 20 mg by mouth daily.   Yes  Historical Provider, MD  pravastatin (PRAVACHOL) 40 MG tablet Take 40 mg by mouth daily.   Yes Historical Provider, MD   BP 144/61  Pulse 70  Temp(Src) 98.1 F (36.7 C) (Oral)  Resp 16  SpO2 99% Physical Exam  Nursing note and vitals reviewed. Constitutional: She is oriented to person, place, and time. She appears well-developed and well-nourished.  HENT:  Head: Normocephalic.  Mouth/Throat: Oropharynx is clear and moist.  Eyes: Conjunctivae and EOM are normal. Pupils are equal, round, and reactive to light.  Neck: Normal range of motion. Neck supple.  Cardiovascular: Normal rate, regular rhythm and normal heart sounds.   Pulmonary/Chest: Effort normal and breath sounds normal. No respiratory distress. She has no wheezes. She has no rales.  Abdominal: Soft. Bowel sounds are normal. She exhibits no distension. There is no tenderness. There is no rebound and no guarding.  Musculoskeletal:  1+ edema R leg, 2+ edema L leg, mild calf tenderness.   Neurological: She is alert and oriented to person, place, and time.  Skin: Skin is warm and dry.  Psychiatric: She has a normal mood and affect. Her behavior is normal. Judgment and thought content normal.    ED Course  Procedures (including critical care time) Labs Review  Labs Reviewed  CBC WITH DIFFERENTIAL - Abnormal; Notable for the following:    Hemoglobin 15.3 (*)    Monocytes Absolute 1.3 (*)    All other components within normal limits  COMPREHENSIVE METABOLIC PANEL    Imaging Review No results found.   EKG Interpretation None      MDM   Final diagnoses:  Pain of left lower leg    MARIROSE DEVENEY is a 68 y.o. female here with L leg pain. I think likely dependent edema vs DVT. No signs of CHF. Will check labs to r/o renal failure and will get DVT study.   6:25 PM No DVT on [place]. Labs unremarkable. Will start low dose lasix and have her f/u with PMD.      Wandra Arthurs, MD 03/15/14 [phone]

## 2014-03-15 NOTE — Discharge Instructions (Signed)
Take lasix 20 mg daily for several days.   Try elevate your legs.   Follow up with your doctor.   Take tramadol as needed for pain.   Return to ER if you have severe pain, worse swelling, shortness of breath.

## 2014-03-15 NOTE — ED Notes (Signed)
Pt presents with left knee pain for about a month now. Has some swelling to her left leg when compared to her right but pt states she doesn't think it seems swollen.

## 2014-03-22 ENCOUNTER — Encounter: Payer: Self-pay | Admitting: Internal Medicine

## 2014-03-22 ENCOUNTER — Ambulatory Visit (INDEPENDENT_AMBULATORY_CARE_PROVIDER_SITE_OTHER): Payer: Medicare Other | Admitting: Internal Medicine

## 2014-03-22 VITALS — BP 130/80 | HR 91 | Temp 98.5°F | Ht 62.0 in | Wt 193.1 lb

## 2014-03-22 DIAGNOSIS — M25562 Pain in left knee: Secondary | ICD-10-CM | POA: Insufficient documentation

## 2014-03-22 DIAGNOSIS — I872 Venous insufficiency (chronic) (peripheral): Secondary | ICD-10-CM | POA: Diagnosis not present

## 2014-03-22 DIAGNOSIS — I10 Essential (primary) hypertension: Secondary | ICD-10-CM | POA: Diagnosis not present

## 2014-03-22 MED ORDER — TRAMADOL HCL 50 MG PO TABS: 50.0000 mg | ORAL_TABLET | Freq: Four times a day (QID) | ORAL | Status: DC | PRN

## 2014-03-22 MED ORDER — HYDROCHLOROTHIAZIDE 12.5 MG PO CAPS: 12.5000 mg | ORAL_CAPSULE | Freq: Every day | ORAL | Status: DC

## 2014-03-22 NOTE — Patient Instructions (Signed)
Please take all new medication as prescribed - the fluid pill, and the pain medication  You will be contacted regarding the referral for: Dr Tamala Julian - sport medicine, regarding the left knee  Please continue all other medications as before  Please have the pharmacy call with any other refills you may need.  Please keep your appointments with your specialists as you may have planned

## 2014-03-22 NOTE — Progress Notes (Signed)
Subjective:    Patient ID: Bridget Grant, female    DOB: [birth year], 68 y.o.   MRN: [id number]  HPI  Here to f/u, c/o on going left knee pain, and right > left leg edema/puffiness; seen in ER Oct 6  With venous doppler LLE neg for DVT; leg swelling overall may be slightly worse on the left in recent days, but has long hx of varicose veins and some intermittent puffiness both legs for several yrs, better in the am, worse in the pm.  Left knee is sharp, mod to occas severe, located medial and post primarily, no fever, trauma, giveaways or falls, ongoing worse now > 1 wk, worse to walk,pain better to sit though still aches. Pt denies chest pain, increased sob or doe, wheezing, orthopnea, PND, increased LE swelling, palpitations, dizziness or syncope.  BP at home is usually < 140/90 Past Medical History  Diagnosis Date  . Hypertension   . Osteopenia   . Depression   . Anxiety   . Hyperlipidemia   . Migraine   . Type II or unspecified type diabetes mellitus without mention of complication, uncontrolled 04/09/2013   Past Surgical History  Procedure Laterality Date  . Abdominal hysterectomy      reports that she has never smoked. She has never used smokeless tobacco. She reports that she does not drink alcohol or use illicit drugs. family history includes Cancer in her mother; Hypertension in her father. No Known Allergies Current Outpatient Prescriptions on File Prior to Visit  Medication Sig Dispense Refill  . aspirin EC 81 MG tablet Take 81 mg by mouth daily.      Marland Kitchen diltiazem (CARDIZEM CD) 240 MG 24 hr capsule Take 240 mg by mouth daily.      . furosemide (LASIX) 20 MG tablet Take 1 tablet (20 mg total) by mouth daily.  5 tablet  0  . lisinopril (PRINIVIL,ZESTRIL) 20 MG tablet Take 20 mg by mouth daily.      . pravastatin (PRAVACHOL) 40 MG tablet Take 40 mg by mouth daily.      . [DISCONTINUED] diltiazem (TIAZAC) 240 MG 24 hr capsule Take 1 capsule (240 mg total) by mouth daily.  90  capsule  3   No current facility-administered medications on file prior to visit.   Review of Systems  Constitutional: Negative for unusual diaphoresis or other sweats  HENT: Negative for ringing in ear Eyes: Negative for double vision or worsening visual disturbance.  Respiratory: Negative for choking and stridor.   Gastrointestinal: Negative for vomiting or other signifcant bowel change Genitourinary: Negative for hematuria or decreased urine volume.  Musculoskeletal: Negative for other MSK pain or swelling Skin: Negative for color change and worsening wound.  Neurological: Negative for tremors and numbness other than noted  Psychiatric/Behavioral: Negative for decreased concentration or agitation other than above       Objective:   Physical Exam BP 130/80  Pulse 91  Temp(Src) 98.5 F (36.9 C) (Oral)  Ht 5\' 2"  (1.575 m)  Wt 193 lb 2 oz (87.601 kg)  BMI 35.31 kg/m2  SpO2 93% VS noted,  Constitutional: Pt appears well-developed, well-nourished.  HENT: Head: NCAT.  Right Ear: External ear normal.  Left Ear: External ear normal.  Eyes: . Pupils are equal, round, and reactive to light. Conjunctivae and EOM are normal Neck: Normal range of motion. Neck supple.  Cardiovascular: Normal rate and regular rhythm.   Pulmonary/Chest: Effort normal and breath sounds normal.  Neurological: Pt is alert. Not  confused , motor grossly intact Skin: Skin is warm. No rash, has trace edema bilat LE below knees left slightly > right, though she is also left handed (calf normally slightly larger) Psychiatric: Pt behavior is normal. No agitation.  Left knee with ? Trace effusion but difficult due to pt obesity, NT to palpate, + mild crepitus on ROM, neg lachmans    Assessment & Plan:

## 2014-03-22 NOTE — Progress Notes (Signed)
Pre visit review using our clinic review tool, if applicable. No additional management support is needed unless otherwise documented below in the visit note. 

## 2014-03-23 NOTE — Assessment & Plan Note (Signed)
With several varicosities bilat and prob chronic puffiness bilat LE related now ? Worse to LLE, venous doppler neg for DVT, suspect primary issue is venous insufficiency by hx and exam, hold on further CV eval for now or renal labs, for hct 12.5 qd, compression stockings, leg elevation, low salt diet, reg exercise, wt loss

## 2014-03-23 NOTE — Assessment & Plan Note (Signed)
?   Etiology, cannot r/o cartilage or DJD flare, pain now mod to severe, for tramadol prn, also refer Dr Vanvoorhis/sport med - ? Need cortisone injection

## 2014-03-23 NOTE — Assessment & Plan Note (Signed)
stable overall by history and exam, recent data reviewed with pt, and pt to continue medical treatment as before,  to f/u any worsening symptoms or concerns BP Readings from Last 3 Encounters:  03/22/14 130/80  03/15/14 144/61  10/05/13 132/70

## 2014-03-30 ENCOUNTER — Other Ambulatory Visit: Payer: Self-pay | Admitting: Dermatology

## 2014-03-30 DIAGNOSIS — D2261 Melanocytic nevi of right upper limb, including shoulder: Secondary | ICD-10-CM | POA: Diagnosis not present

## 2014-03-30 DIAGNOSIS — D225 Melanocytic nevi of trunk: Secondary | ICD-10-CM | POA: Diagnosis not present

## 2014-03-30 DIAGNOSIS — D2271 Melanocytic nevi of right lower limb, including hip: Secondary | ICD-10-CM | POA: Diagnosis not present

## 2014-03-30 DIAGNOSIS — D1801 Hemangioma of skin and subcutaneous tissue: Secondary | ICD-10-CM | POA: Diagnosis not present

## 2014-03-30 DIAGNOSIS — D485 Neoplasm of uncertain behavior of skin: Secondary | ICD-10-CM | POA: Diagnosis not present

## 2014-03-30 DIAGNOSIS — D2262 Melanocytic nevi of left upper limb, including shoulder: Secondary | ICD-10-CM | POA: Diagnosis not present

## 2014-03-30 DIAGNOSIS — L918 Other hypertrophic disorders of the skin: Secondary | ICD-10-CM | POA: Diagnosis not present

## 2014-03-30 DIAGNOSIS — L821 Other seborrheic keratosis: Secondary | ICD-10-CM | POA: Diagnosis not present

## 2014-04-01 ENCOUNTER — Ambulatory Visit (INDEPENDENT_AMBULATORY_CARE_PROVIDER_SITE_OTHER): Payer: Medicare Other | Admitting: Family Medicine

## 2014-04-01 ENCOUNTER — Ambulatory Visit (INDEPENDENT_AMBULATORY_CARE_PROVIDER_SITE_OTHER)
Admission: RE | Admit: 2014-04-01 | Discharge: 2014-04-01 | Disposition: A | Discharge status: Home / Self Care | Payer: Medicare Other | Source: Ambulatory Visit | Attending: Family Medicine | Admitting: Family Medicine

## 2014-04-01 ENCOUNTER — Encounter: Payer: Self-pay | Admitting: Family Medicine

## 2014-04-01 ENCOUNTER — Other Ambulatory Visit (INDEPENDENT_AMBULATORY_CARE_PROVIDER_SITE_OTHER): Payer: Medicare Other

## 2014-04-01 VITALS — BP 106/68 | HR 62 | Ht 62.0 in | Wt 197.0 lb

## 2014-04-01 DIAGNOSIS — M23201 Derangement of unspecified lateral meniscus due to old tear or injury, left knee: Secondary | ICD-10-CM | POA: Diagnosis not present

## 2014-04-01 DIAGNOSIS — M7989 Other specified soft tissue disorders: Secondary | ICD-10-CM | POA: Diagnosis not present

## 2014-04-01 DIAGNOSIS — M25562 Pain in left knee: Secondary | ICD-10-CM

## 2014-04-01 DIAGNOSIS — M1712 Unilateral primary osteoarthritis, left knee: Secondary | ICD-10-CM | POA: Diagnosis not present

## 2014-04-01 DIAGNOSIS — M23204 Derangement of unspecified medial meniscus due to old tear or injury, left knee: Secondary | ICD-10-CM | POA: Diagnosis not present

## 2014-04-01 DIAGNOSIS — M23207 Derangement of unspecified meniscus due to old tear or injury, left knee: Secondary | ICD-10-CM

## 2014-04-01 IMAGING — CR DG KNEE COMPLETE 4+V*L*
4 series · 4 of 4 positions shown · non-contrast
Comparison: None.

CLINICAL DATA: Generalized knee pain and swelling.

EXAM:
LEFT KNEE - COMPLETE 4+ VIEW

[view not recorded (1 of 4)]
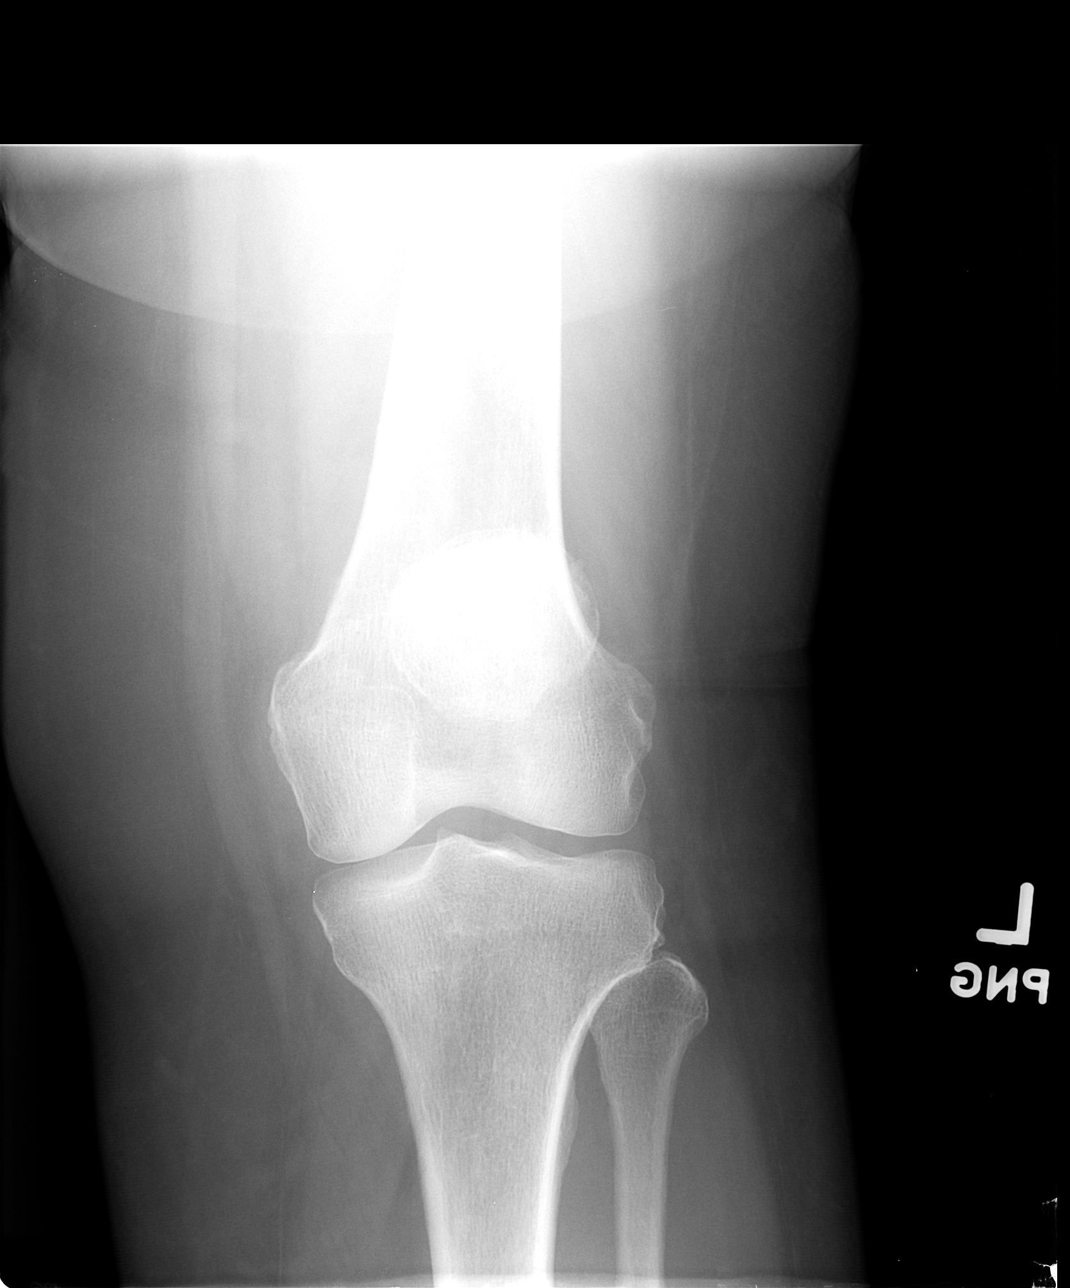

[view not recorded (2 of 4)]
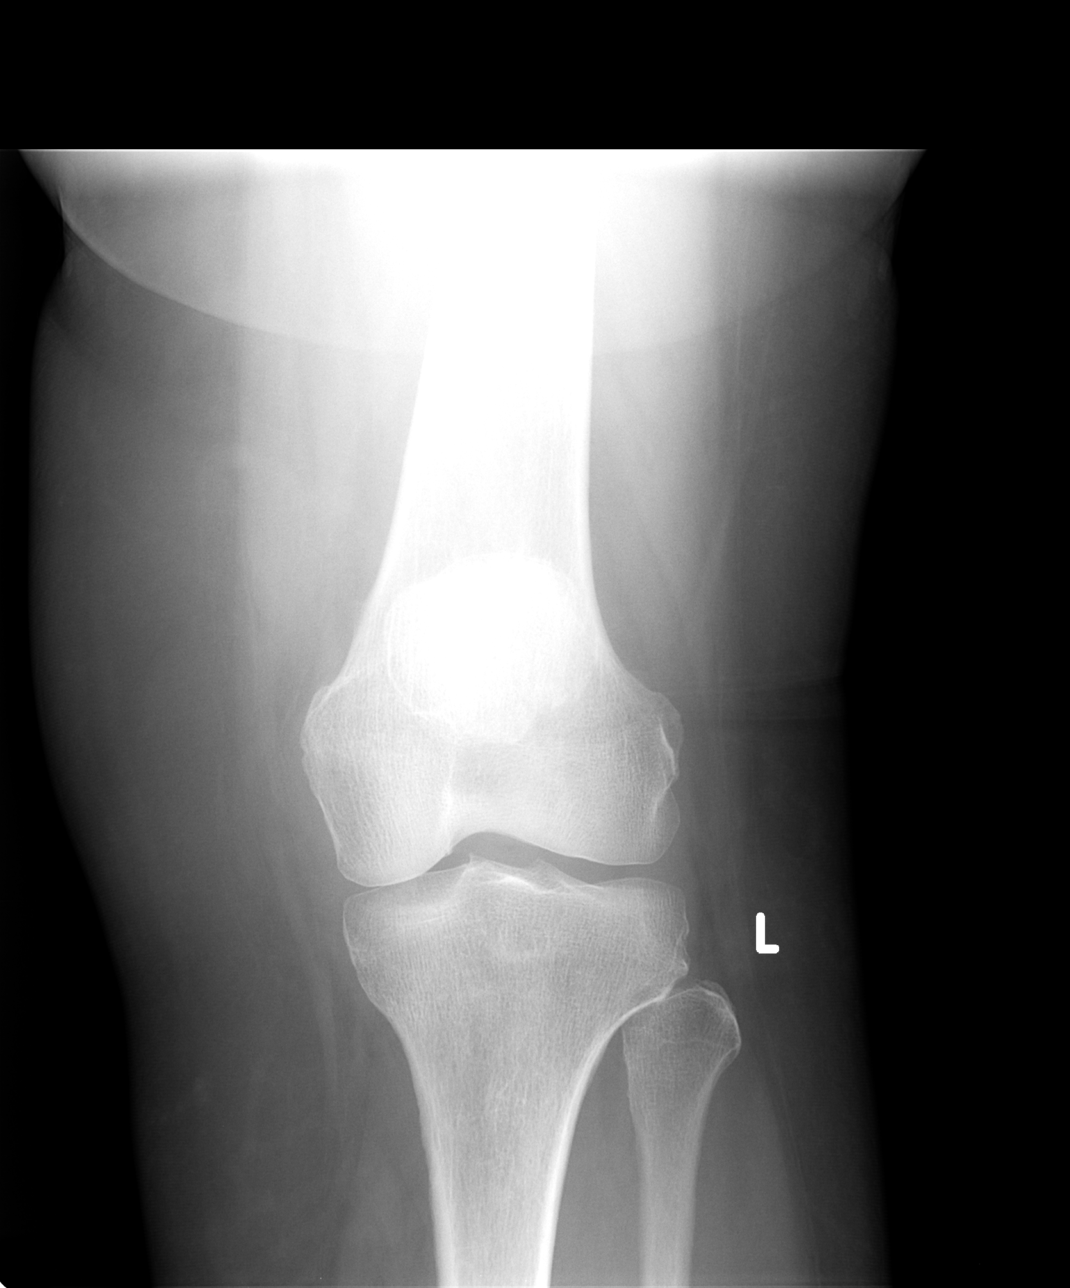

[view not recorded (3 of 4)]
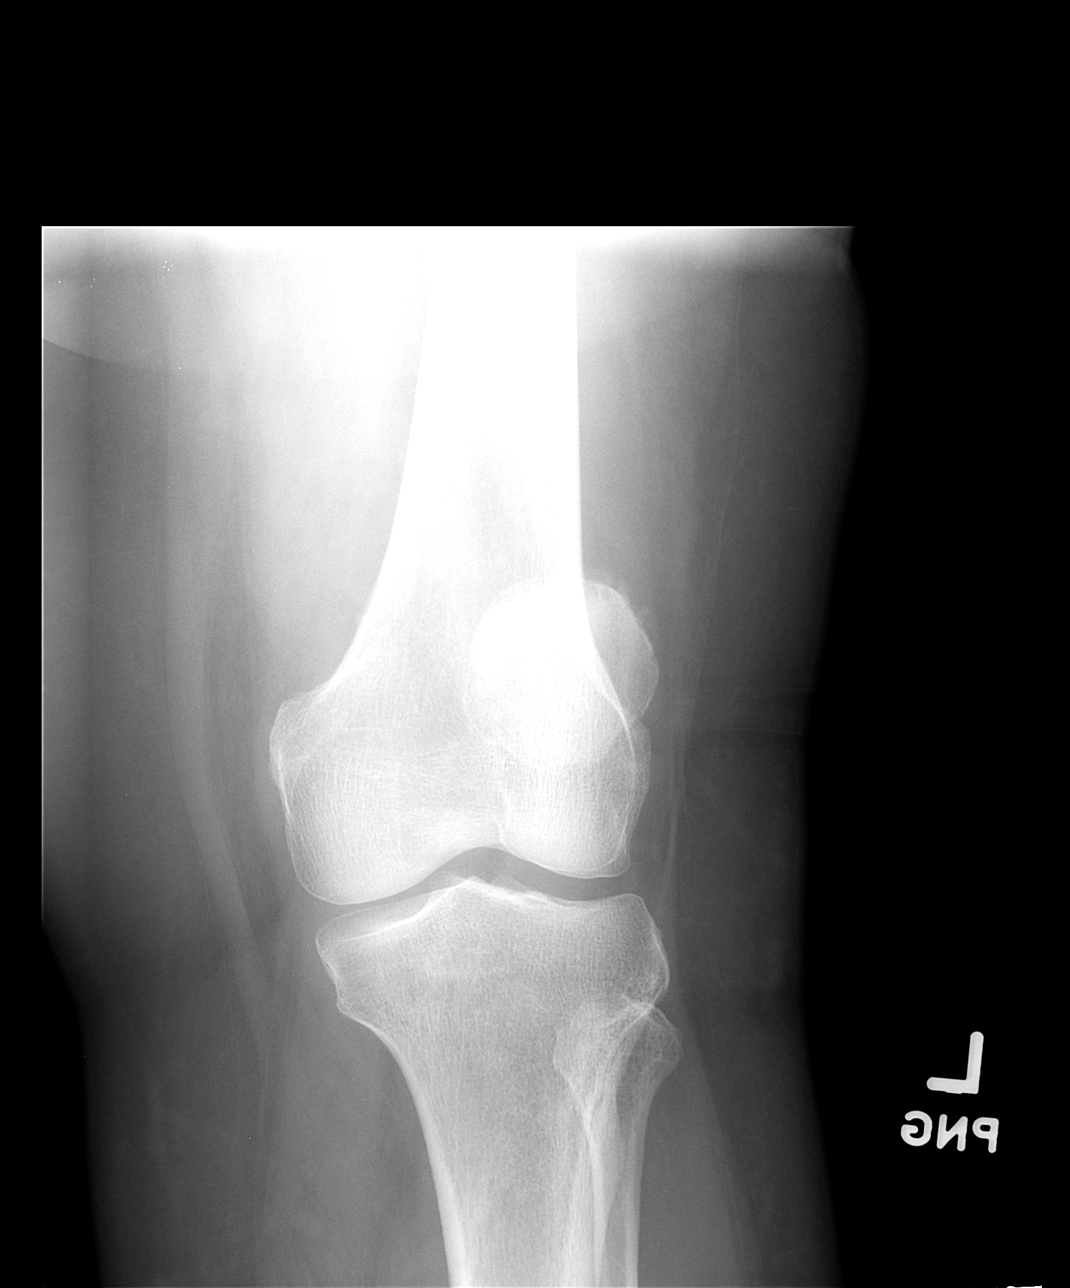

[view not recorded (4 of 4)]
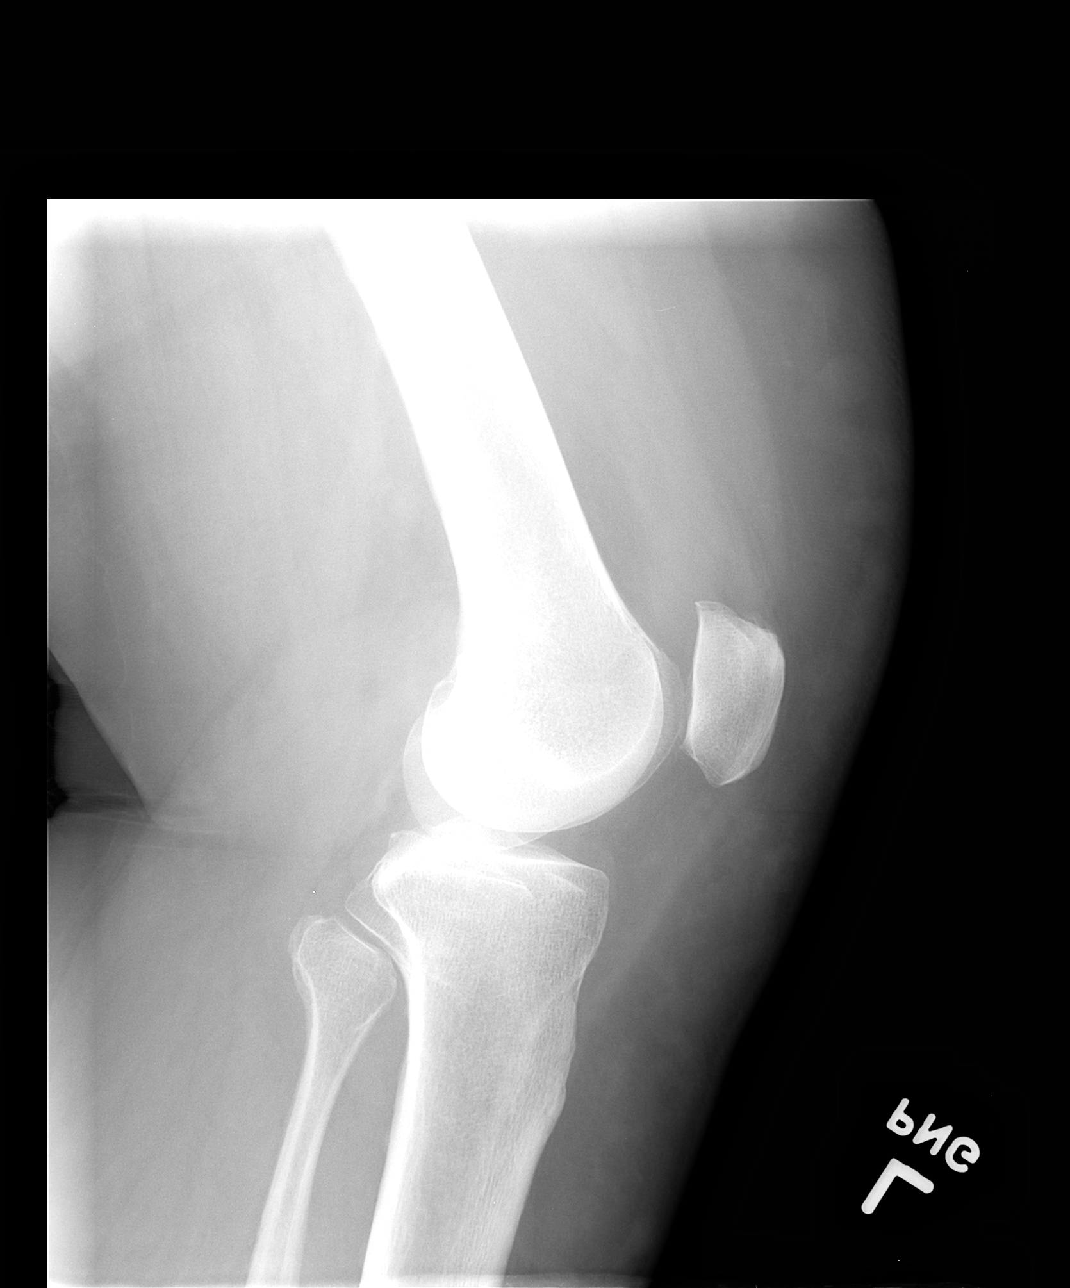

[4 of 4 positions shown; findings below may reference images not displayed]

FINDINGS: Mild patellofemoral osteoarthritis. The alignment of the knee is
anatomic. No effusion. Medial and lateral joint spaces are
preserved. No fracture.
IMPRESSION: Mild patellofemoral osteoarthritis.

## 2014-04-01 NOTE — Assessment & Plan Note (Signed)
Patient decided on conservative therapy. Patient is to do icing and was given a brace today. We discussed home exercises and showed proper technique. We discussed proper shoe wear and over-the-counter orthotics they can be beneficial. We discussed the importance of weight loss. Patient will come back again in 3 weeks and we will further evaluate. If continuing to have pain we'll consider corticosteroid injection.

## 2014-04-01 NOTE — Patient Instructions (Signed)
Very nice to meet you Ice 20 minutes 2 times daily. Usually after activity and before bed. Exercises 3 times a week.  Pennsaid twice daily as needed Wear brace daily OK to do treadmill but wear brace and decrease incline.  Take tylenol 650 mg three times a day is the best evidence based medicine we have for arthritis.  Glucosamine sulfate 750mg  twice a day is a supplement that has been shown to help moderate to severe arthritis. Vitamin D 2000 IU daily Fish oil 2 grams daily.  Tumeric 500mg  twice daily.  Capsaicin topically up to four times a day may also help with pain. Cortisone injections are an option if these interventions do not seem to make a difference or need more relief.  If cortisone injections do not help, there are different types of shots that may help but they take longer to take effect.  We can discuss this at follow up.  It's important that you continue to stay active. Controlling your weight is important.  Consider physical therapy to strengthen muscles around the joint that hurts to take pressure off of the joint itself. Shoe inserts with good arch support may be helpful.  [employer] orthotics at [employer] [facility] could help.  Water aerobics and cycling with low resistance are the best two types of exercise for arthritis. Come back and see me in 3 weeks.

## 2014-04-01 NOTE — Progress Notes (Signed)
[facility] [address] [place], [place] [address] Phone: [phone] Subjective:    I'm seeing this patient by the request  of:  Cathlean Cower, MD   CC: Left knee pain  [id number] Bridget Grant is a 68 y.o. female coming in with complaint of left knee pain. Patient states that this pain has gotten worse over the course of last month. Patient states she was doing some yard work and she noticed the pain at one point. Patient states now it starts given her a dull aching pain with any type of ambulation. States that he can he can wake her up at night. Denies any radiation of pain or any numbness. Sometimes feels like it can give out on her. Does not remember any injuries previous to this month. Patient tries to walk on a treadmill daily for exercise but finds it difficult to do the pain. Patient rates the severity as 4/10. Has not had much improvement with OTC medications still able to do daily activities.     Past medical history, social, surgical and family history all reviewed in electronic medical record.   Review of Systems: No headache, visual changes, nausea, vomiting, diarrhea, constipation, dizziness, abdominal pain, skin rash, fevers, chills, night sweats, weight loss, swollen lymph nodes, body aches, joint swelling, muscle aches, chest pain, shortness of breath, mood changes.   Objective Blood pressure 106/68, pulse 62, height 5\' 2"  (1.575 m), weight 197 lb (89.359 kg), SpO2 95.00%.  General: No apparent distress alert and oriented x3 mood and affect normal, dressed appropriately. Obese HEENT: Pupils equal, extraocular movements intact  Respiratory: Patient's speak in full sentences and does not appear short of breath  Cardiovascular: No lower extremity edema, non tender, no erythema  Skin: Warm dry intact with no signs of infection or rash on extremities or on axial skeleton.  Abdomen: Soft nontender  Neuro: Cranial nerves II through XII are  intact, neurovascularly intact in all extremities with 2+ DTRs and 2+ pulses.  Lymph: No lymphadenopathy of posterior or anterior cervical chain or axillae bilaterally.  Gait normal with good balance and coordination.  MSK:  Non tender with full range of motion and good stability and symmetric strength and tone of shoulders, elbows, wrist, hip, and ankles bilaterally.  Knee: Left Patient has a large varicose vein going over the patella. Severe tenderness to the medial joint line ROM full in flexion and extension and lower leg rotation. Ligaments with solid consistent endpoints including ACL, PCL, LCL, MCL. Negative Mcmurray's, Apley's, and Thessalonian tests.  painful patellar compression. Patellar glide with crepitus. Patellar and quadriceps tendons unremarkable. Hamstring and quadriceps strength is normal.  Contralateral knee mild crepitus but nontender  MSK US performed of: Left knee This study was ordered, performed, and interpreted by Charlann Boxer D.O.  Knee: All structures visualized. Severe narrowing of the medial joint line and mild narrowing of the lateral joint line. Patient does have what appears to be a chronic meniscal tear with displacement of the posterior medial meniscus. Patellar Tendon unremarkable on long and transverse views without effusion. No abnormality of prepatellar bursa. LCL and MCL unremarkable on long and transverse views. No abnormality of origin of medial or lateral head of the gastrocnemius.  IMPRESSION:  Moderate to severe osteoarthritic changes of the medial joint line as well as a chronic meniscal tear.   X-rays pending    Impression and Recommendations:     This case required medical decision making of moderate complexity.

## 2014-04-08 ENCOUNTER — Encounter: Payer: Medicare Other | Admitting: Internal Medicine

## 2014-04-13 ENCOUNTER — Ambulatory Visit (INDEPENDENT_AMBULATORY_CARE_PROVIDER_SITE_OTHER): Payer: Medicare Other | Admitting: Internal Medicine

## 2014-04-13 ENCOUNTER — Encounter: Payer: Self-pay | Admitting: Internal Medicine

## 2014-04-13 ENCOUNTER — Other Ambulatory Visit (INDEPENDENT_AMBULATORY_CARE_PROVIDER_SITE_OTHER): Payer: Medicare Other

## 2014-04-13 VITALS — BP 140/84 | HR 71 | Temp 98.3°F | Ht 62.0 in | Wt 197.1 lb

## 2014-04-13 DIAGNOSIS — Z23 Encounter for immunization: Secondary | ICD-10-CM | POA: Diagnosis not present

## 2014-04-13 DIAGNOSIS — E119 Type 2 diabetes mellitus without complications: Secondary | ICD-10-CM

## 2014-04-13 DIAGNOSIS — F329 Major depressive disorder, single episode, unspecified: Secondary | ICD-10-CM

## 2014-04-13 DIAGNOSIS — E785 Hyperlipidemia, unspecified: Secondary | ICD-10-CM | POA: Diagnosis not present

## 2014-04-13 DIAGNOSIS — I10 Essential (primary) hypertension: Secondary | ICD-10-CM | POA: Diagnosis not present

## 2014-04-13 DIAGNOSIS — F32A Depression, unspecified: Secondary | ICD-10-CM

## 2014-04-13 LAB — HEPATIC FUNCTION PANEL
ALT: 21 U/L (ref 0–35)
AST: 22 U/L (ref 0–37)
Albumin: 3.7 g/dL (ref 3.5–5.2)
Alkaline Phosphatase: 51 U/L (ref 39–117)
BILIRUBIN TOTAL: 0.7 mg/dL (ref 0.2–1.2)
Bilirubin, Direct: 0 mg/dL (ref 0.0–0.3)
Total Protein: 7.7 g/dL (ref 6.0–8.3)

## 2014-04-13 LAB — URINALYSIS, ROUTINE W REFLEX MICROSCOPIC
BILIRUBIN URINE: NEGATIVE
Hgb urine dipstick: NEGATIVE
KETONES UR: NEGATIVE
Leukocytes, UA: NEGATIVE
NITRITE: NEGATIVE
Specific Gravity, Urine: 1.01 (ref 1.000–1.030)
TOTAL PROTEIN, URINE-UPE24: NEGATIVE
Urine Glucose: NEGATIVE
Urobilinogen, UA: 0.2 (ref 0.0–1.0)
pH: 5.5 (ref 5.0–8.0)

## 2014-04-13 LAB — CBC WITH DIFFERENTIAL/PLATELET
BASOS PCT: 0.4 % (ref 0.0–3.0)
Basophils Absolute: 0 10*3/uL (ref 0.0–0.1)
EOS PCT: 2.9 % (ref 0.0–5.0)
Eosinophils Absolute: 0.3 10*3/uL (ref 0.0–0.7)
HEMATOCRIT: 45.3 % (ref 36.0–46.0)
HEMOGLOBIN: 15.2 g/dL — AB (ref 12.0–15.0)
LYMPHS ABS: 3.3 10*3/uL (ref 0.7–4.0)
Lymphocytes Relative: 37.3 % (ref 12.0–46.0)
MCHC: 33.5 g/dL (ref 30.0–36.0)
MCV: 94.7 fl (ref 78.0–100.0)
MONO ABS: 1.1 10*3/uL — AB (ref 0.1–1.0)
Monocytes Relative: 12 % (ref 3.0–12.0)
Neutro Abs: 4.1 10*3/uL (ref 1.4–7.7)
Neutrophils Relative %: 47.4 % (ref 43.0–77.0)
Platelets: 225 10*3/uL (ref 150.0–400.0)
RBC: 4.79 Mil/uL (ref 3.87–5.11)
RDW: 13.1 % (ref 11.5–15.5)
WBC: 8.7 10*3/uL (ref 4.0–10.5)

## 2014-04-13 LAB — MICROALBUMIN / CREATININE URINE RATIO
Creatinine,U: 69.4 mg/dL
Microalb Creat Ratio: 0.9 mg/g (ref 0.0–30.0)
Microalb, Ur: 0.6 mg/dL (ref 0.0–1.9)

## 2014-04-13 LAB — LIPID PANEL
CHOL/HDL RATIO: 5
Cholesterol: 209 mg/dL — ABNORMAL HIGH (ref 0–200)
HDL: 46.1 mg/dL (ref >39.00)
LDL CALC: 127 mg/dL — AB (ref 0–99)
NonHDL: 162.9
TRIGLYCERIDES: 181 mg/dL — AB (ref 0.0–149.0)
VLDL: 36.2 mg/dL (ref 0.0–40.0)

## 2014-04-13 LAB — BASIC METABOLIC PANEL
BUN: 17 mg/dL (ref 6–23)
CO2: 23 mEq/L (ref 19–32)
CREATININE: 0.7 mg/dL (ref 0.4–1.2)
Calcium: 9.8 mg/dL (ref 8.4–10.5)
Chloride: 102 mEq/L (ref 96–112)
GFR: 91.75 mL/min (ref >60.00)
Glucose, Bld: 74 mg/dL (ref 70–99)
Potassium: 4.3 mEq/L (ref 3.5–5.1)
Sodium: 137 mEq/L (ref 135–145)

## 2014-04-13 LAB — HEMOGLOBIN A1C: Hgb A1c MFr Bld: 6.2 % (ref 4.6–6.5)

## 2014-04-13 MED ORDER — LISINOPRIL 20 MG PO TABS: 20.0000 mg | ORAL_TABLET | Freq: Every day | ORAL | Status: DC

## 2014-04-13 MED ORDER — HYDROCHLOROTHIAZIDE 12.5 MG PO CAPS: 12.5000 mg | ORAL_CAPSULE | Freq: Every day | ORAL | Status: DC

## 2014-04-13 MED ORDER — DILTIAZEM HCL ER COATED BEADS 240 MG PO CP24: 240.0000 mg | ORAL_CAPSULE | Freq: Every day | ORAL | Status: DC

## 2014-04-13 MED ORDER — PRAVASTATIN SODIUM 40 MG PO TABS: 40.0000 mg | ORAL_TABLET | Freq: Every day | ORAL | Status: DC

## 2014-04-13 NOTE — Assessment & Plan Note (Addendum)
ECG reviewed as per emr, stable overall by history and exam, recent data reviewed with pt, and pt to continue medical treatment as before,  to f/u any worsening symptoms or concerns BP Readings from Last 3 Encounters:  04/13/14 140/84  04/01/14 106/68  03/22/14 130/80

## 2014-04-13 NOTE — Patient Instructions (Signed)
You had the new Prevnar pneumonia shot  We can plan for the pneumovax shot for next year  Your EKG was good today  Please continue all other medications as before, and refills have been done if requested.  Please have the pharmacy call with any other refills you may need.  Please continue your efforts at being more active, low cholesterol diet, and weight control.  You are otherwise up to date with prevention measures today.  Please keep your appointments with your specialists as you may have planned  Please go to the LAB in the Basement (turn left off the elevator) for the tests to be done today  You will be contacted by phone if any changes need to be made immediately.  Otherwise, you will receive a letter about your results with an explanation, but please check with MyChart first.  Please remember to sign up for MyChart if you have not done so, as this will be important to you in the future with finding out test results, communicating by private email, and scheduling acute appointments online when needed.  Please return in 6 months, or sooner if needed

## 2014-04-13 NOTE — Assessment & Plan Note (Signed)
stable overall by history and exam, recent data reviewed with pt, and pt to continue medical treatment as before,  to f/u any worsening symptoms or concerns Lab Results  Component Value Date   LDLCALC 103* 10/05/2013   For f/u lab, cont diet

## 2014-04-13 NOTE — Addendum Note (Signed)
Addended by: Sharon Seller B on: 04/13/2014 01:56 PM   Modules accepted: Orders

## 2014-04-13 NOTE — Addendum Note (Signed)
Addended by: [name] on: 04/13/2014 03:31 PM   Modules accepted: Orders, SmartSet

## 2014-04-13 NOTE — Progress Notes (Signed)
Pre visit review using our clinic review tool, if applicable. No additional management support is needed unless otherwise documented below in the visit note. 

## 2014-04-13 NOTE — Assessment & Plan Note (Signed)
stable overall by history and exam, recent data reviewed with pt, and pt to continue medical treatment as before,  to f/u any worsening symptoms or concerns Lab Results  Component Value Date   HGBA1C 5.8 10/05/2013   For f/u lab,cont activity and diet control

## 2014-04-13 NOTE — Assessment & Plan Note (Signed)
stable overall by history and exam, recent data reviewed with pt, and pt to continue medical treatment as before,  to f/u any worsening symptoms or concerns Lab Results  Component Value Date   WBC 10.5 03/15/2014   HGB 15.3* 03/15/2014   HCT 44.8 03/15/2014   PLT 216 03/15/2014   GLUCOSE 94 03/15/2014   CHOL 174 10/05/2013   TRIG 116.0 10/05/2013   HDL 47.40 10/05/2013   LDLDIRECT 141.1 07/08/2007   LDLCALC 103* 10/05/2013   ALT 16 03/15/2014   AST 19 03/15/2014   NA 140 03/15/2014   K 4.3 03/15/2014   CL 101 03/15/2014   CREATININE 0.61 03/15/2014   BUN 14 03/15/2014   CO2 26 03/15/2014   TSH 1.33 10/05/2013   HGBA1C 5.8 10/05/2013   MICROALBUR 0.2 10/05/2013

## 2014-04-13 NOTE — Progress Notes (Signed)
Subjective:    Patient ID: Bridget Grant, female    DOB: Aug 06, 1945, 68 y.o.   MRN: [id number]  HPI  Here for yearly f/u;  Overall doing ok;  Pt denies CP, worsening SOB, DOE, wheezing, orthopnea, PND, worsening LE edema, palpitations, dizziness or syncope.  Pt denies neurological change such as new headache, facial or extremity weakness.  Pt denies polydipsia, polyuria, or low sugar symptoms. Pt states overall good compliance with treatment and medications, good tolerability, and has been trying to follow lower cholesterol diet.  Pt denies worsening depressive symptoms, suicidal ideation or panic. No fever, night sweats, wt loss, loss of appetite, or other constitutional symptoms.  Pt states good ability with ADL's, has low fall risk, home safety reviewed and adequate, no other significant changes in hearing or vision, and only occasionally active with exercise. No current complaints. Denies worsening depressive symptoms, suicidal ideation, or panic Past Medical History  Diagnosis Date  . Hypertension   . Osteopenia   . Depression   . Anxiety   . Hyperlipidemia   . Migraine   . Type II or unspecified type diabetes mellitus without mention of complication, uncontrolled 04/09/2013   Past Surgical History  Procedure Laterality Date  . Abdominal hysterectomy      reports that she has never smoked. She has never used smokeless tobacco. She reports that she does not drink alcohol or use illicit drugs. family history includes Cancer in her mother; Hypertension in her father. No Known Allergies Current Outpatient Prescriptions on File Prior to Visit  Medication Sig Dispense Refill  . aspirin EC 81 MG tablet Take 81 mg by mouth daily.    . furosemide (LASIX) 20 MG tablet Take 1 tablet (20 mg total) by mouth daily. 5 tablet 0  . traMADol (ULTRAM) 50 MG tablet Take 1 tablet (50 mg total) by mouth every 6 (six) hours as needed. 120 tablet 0  . [DISCONTINUED] diltiazem (TIAZAC) 240 MG 24 hr capsule  Take 1 capsule (240 mg total) by mouth daily. 90 capsule 3   No current facility-administered medications on file prior to visit.   Review of Systems Constitutional: Negative for increased diaphoresis, other activity, appetite or other siginficant weight change  HENT: Negative for worsening hearing loss, ear pain, facial swelling, mouth sores and neck stiffness.   Eyes: Negative for other worsening pain, redness or visual disturbance.  Respiratory: Negative for shortness of breath and wheezing.   Cardiovascular: Negative for chest pain and palpitations.  Gastrointestinal: Negative for diarrhea, blood in stool, abdominal distention or other pain Genitourinary: Negative for hematuria, flank pain or change in urine volume.  Musculoskeletal: Negative for myalgias or other joint complaints.  Skin: Negative for color change and wound.  Neurological: Negative for syncope and numbness. other than noted Hematological: Negative for adenopathy. or other swelling Psychiatric/Behavioral: Negative for hallucinations, self-injury, decreased concentration or other worsening agitation.      Objective:   Physical Exam BP 140/84 mmHg  Pulse 71  Temp(Src) 98.3 F (36.8 C) (Oral)  Ht 5\' 2"  (1.575 m)  Wt 197 lb 2 oz (89.415 kg)  BMI 36.05 kg/m2  SpO2 97% VS noted,  Constitutional: Pt is oriented to person, place, and time. Appears well-developed and well-nourished.  Head: Normocephalic and atraumatic.  Right Ear: External ear normal.  Left Ear: External ear normal.  Nose: Nose normal.  Mouth/Throat: Oropharynx is clear and moist.  Eyes: Conjunctivae and EOM are normal. Pupils are equal, round, and reactive to light.  Neck:  Normal range of motion. Neck supple. No JVD present. No tracheal deviation present.  Cardiovascular: Normal rate, regular rhythm, normal heart sounds and intact distal pulses.   Pulmonary/Chest: Effort normal and breath sounds without rales or wheezing  Abdominal: Soft. Bowel  sounds are normal. NT. No HSM  Musculoskeletal: Normal range of motion. Exhibits no edema.  Lymphadenopathy:  Has no cervical adenopathy.  Neurological: Pt is alert and oriented to person, place, and time. Pt has normal reflexes. No cranial nerve deficit. Motor grossly intact Skin: Skin is warm and dry. No rash noted.  Psychiatric:  Has normal mood and affect. Behavior is normal. not depressed affect    Assessment & Plan:

## 2014-04-14 ENCOUNTER — Other Ambulatory Visit: Payer: Self-pay | Admitting: Internal Medicine

## 2014-04-14 ENCOUNTER — Encounter: Payer: Self-pay | Admitting: Internal Medicine

## 2014-04-14 LAB — TSH: TSH: 1.69 u[IU]/mL (ref 0.35–4.50)

## 2014-04-14 MED ORDER — ATORVASTATIN CALCIUM 20 MG PO TABS: 20.0000 mg | ORAL_TABLET | Freq: Every day | ORAL | Status: DC

## 2014-04-18 ENCOUNTER — Telehealth: Payer: Self-pay | Admitting: *Deleted

## 2014-04-18 MED ORDER — PRAVASTATIN SODIUM 80 MG PO TABS: 80.0000 mg | ORAL_TABLET | Freq: Every day | ORAL | Status: DC

## 2014-04-18 NOTE — Telephone Encounter (Signed)
Ok for pravastatin 80 mg, which is the same as 40 mg x 2 she was supposed to be taking before  D/c lipitor

## 2014-04-18 NOTE — Telephone Encounter (Signed)
Left msg on triage requesting call back concerning her medication. Called pt back she stated md want to change her to Lipitor for cholesterol. Pt states before md order for her to take (2) pravastatin every day, but whn she call for a refill instructions was for her to take 1 pill a day. She is wondering is that's the reason why her cholesterol was out of wack. She is wanting  to stay on pravastatin. She states she doesn't like the side effect for lipitor. Inform pt md is out of office today will be tomorrow before he can response...Johny Chess

## 2014-04-19 NOTE — Telephone Encounter (Signed)
Notified pt with md response.../lmb 

## 2014-04-22 ENCOUNTER — Ambulatory Visit: Payer: Medicare Other | Admitting: Internal Medicine

## 2014-04-26 ENCOUNTER — Ambulatory Visit (INDEPENDENT_AMBULATORY_CARE_PROVIDER_SITE_OTHER): Payer: Medicare Other | Admitting: Family Medicine

## 2014-04-26 ENCOUNTER — Encounter: Payer: Self-pay | Admitting: Family Medicine

## 2014-04-26 VITALS — BP 126/80 | HR 63 | Ht 62.0 in | Wt 198.0 lb

## 2014-04-26 DIAGNOSIS — M1712 Unilateral primary osteoarthritis, left knee: Secondary | ICD-10-CM | POA: Diagnosis not present

## 2014-04-26 MED ORDER — DICLOFENAC SODIUM 2 % TD SOLN: TRANSDERMAL | Status: DC

## 2014-04-26 NOTE — Patient Instructions (Signed)
Good to see you Continue the compression sleeve Still use ice at end of long days.  Try the pennsaid twice daily and call the pharmacy.  Continue tylenol 3 times a day no matter what.  See me again in 4 weeks.

## 2014-04-26 NOTE — Progress Notes (Signed)
  [facility] [address] [place], [place] [address] Phone: [phone] Subjective:    CC: Left knee pain, follow up  [id number] Bridget Grant is a 68 y.o. female coming in with complaint of left knee pain.  Patient was found to have some mild osteophytic changes of this knee joint as well as some chronic meniscal tear. Patient was given home exercises, bracing, and topical anti-inflammatories. Patient states that she is 60-70% better. Denies any new symptoms.    Past medical history, social, surgical and family history all reviewed in electronic medical record.   Review of Systems: No headache, visual changes, nausea, vomiting, diarrhea, constipation, dizziness, abdominal pain, skin rash, fevers, chills, night sweats, weight loss, swollen lymph nodes, body aches, joint swelling, muscle aches, chest pain, shortness of breath, mood changes.   Objective Blood pressure 126/80, weight 198 lb (89.812 kg).  General: No apparent distress alert and oriented x3 mood and affect normal, dressed appropriately. Obese HEENT: Pupils equal, extraocular movements intact  Respiratory: Patient's speak in full sentences and does not appear short of breath  Cardiovascular: No lower extremity edema, non tender, no erythema  Skin: Warm dry intact with no signs of infection or rash on extremities or on axial skeleton.  Abdomen: Soft nontender  Neuro: Cranial nerves II through XII are intact, neurovascularly intact in all extremities with 2+ DTRs and 2+ pulses.  Lymph: No lymphadenopathy of posterior or anterior cervical chain or axillae bilaterally.  Gait normal with good balance and coordination.  MSK:  Non tender with full range of motion and good stability and symmetric strength and tone of shoulders, elbows, wrist, hip, and ankles bilaterally.  Knee: Left Patient has a large varicose vein going over the patella. Improve medial joint line tenderness ROM full in flexion and  extension and lower leg rotation. Ligaments with solid consistent endpoints including ACL, PCL, LCL, MCL. Mild positive Mcmurray's, Apley's, and Thessalonian tests.  painful patellar compression. Patellar glide with crepitus. Patellar and quadriceps tendons unremarkable. Hamstring and quadriceps strength is normal.   Contralateral knee mild crepitus but nontender        Impression and Recommendations:     This case required medical decision making of moderate complexity.

## 2014-04-26 NOTE — Assessment & Plan Note (Addendum)
Patient is doing well with conservative therapy. Patient was given the opportunity to have a steroid injection which patient declined.  Patient will continue with these conservative therapies and then come back and see me again in 3 weeks. Continue to have pain once again I would be adamant about doing a steroid injection.  Spent greater than 25 minutes with patient face-to-face and had greater than 50% of counseling including as described above in assessment and plan.

## 2014-05-24 ENCOUNTER — Ambulatory Visit (INDEPENDENT_AMBULATORY_CARE_PROVIDER_SITE_OTHER): Payer: Medicare Other | Admitting: Family Medicine

## 2014-05-24 ENCOUNTER — Encounter: Payer: Self-pay | Admitting: Family Medicine

## 2014-05-24 VITALS — BP 118/80 | HR 75 | Ht 62.0 in | Wt 201.0 lb

## 2014-05-24 DIAGNOSIS — M1712 Unilateral primary osteoarthritis, left knee: Secondary | ICD-10-CM

## 2014-05-24 NOTE — Patient Instructions (Signed)
Good to see you Happy holidays!  Conitnue the pennsaid but put lotion on as well. Try only 1 time a day for 2 weeks then stop if no pain Ice is your friend Try the compression sleeve a little tighter from [employer] [facility] [employer].  Avoid excessive twisting.  See me again when you need me.

## 2014-05-24 NOTE — Assessment & Plan Note (Signed)
Patient symptomatically is doing significantly well at this time. Patient does have some mild patellofemoral arthritis as well as the chronic meniscal tear. His lungs patient can continue to respond well to conservative therapy will continue. We discussed over-the-counter bracing, continue natural supplementations and continuing the topical anti-inflammatory. Patient was given phase II strengthening exercises today. We discussed icing regimen. Patient will come back and see me again in 4-6 weeks if any other pain.

## 2014-05-24 NOTE — Progress Notes (Signed)
  [facility] [place],  [address] Phone: [phone] Subjective:    CC: Left knee pain, follow up  Bridget Grant is a 68 y.o. female coming in with complaint of left knee pain.  Patient was found to have some mild osteophytic changes of this knee joint as well as some chronic meniscal tear. Patient states at this point she is up proximally 100%. Patient continues with the compression sleeve. Denies any radiation of pain or any numbness. Patient still has tried to avoid any significant exercising at this time. Patient denies though any clicking, locking or giving out on her. Patient is very happy with the results. Continues to topical anti-inflammatory.    Past medical history, social, surgical and family history all reviewed in electronic medical record.   Review of Systems: No headache, visual changes, nausea, vomiting, diarrhea, constipation, dizziness, abdominal pain, skin rash, fevers, chills, night sweats, weight loss, swollen lymph nodes, body aches, joint swelling, muscle aches, chest pain, shortness of breath, mood changes.   Objective Blood pressure 118/80, pulse 75, height 5\' 2"  (1.575 m), weight 201 lb (91.173 kg), SpO2 98 %.  General: No apparent distress alert and oriented x3 mood and affect normal, dressed appropriately. Obese HEENT: Pupils equal, extraocular movements intact  Respiratory: Patient's speak in full sentences and does not appear short of breath  Cardiovascular: No lower extremity edema, non tender, no erythema  Skin: Warm dry intact with no signs of infection or rash on extremities or on axial skeleton.  Abdomen: Soft nontender  Neuro: Cranial nerves II through XII are intact, neurovascularly intact in all extremities with 2+ DTRs and 2+ pulses.  Lymph: No lymphadenopathy of posterior or anterior cervical chain or axillae bilaterally.  Gait normal with good balance and coordination.  MSK:  Non  tender with full range of motion and good stability and symmetric strength and tone of shoulders, elbows, wrist, hip, and ankles bilaterally.  Knee: Left Patient has a large varicose vein going over the patella. Minimal joint line tenderness ROM full in flexion and extension and lower leg rotation. Ligaments with solid consistent endpoints including ACL, PCL, LCL, MCL. Negative Mcmurray's, Apley's, and Thessalonian tests.  painful patellar compression but improved from previous exam. Patellar glide with crepitusstill present. Patellar and quadriceps tendons unremarkable. Hamstring and quadriceps strength is normal.   Contralateral knee mild crepitus but nontender        Impression and Recommendations:     This case required medical decision making of moderate complexity.

## 2014-06-19 NOTE — Addendum Note (Signed)
Addended by: [name] on: 06/19/2014 06:45 PM   Modules accepted: Miquel Dunn

## 2014-10-12 ENCOUNTER — Ambulatory Visit (INDEPENDENT_AMBULATORY_CARE_PROVIDER_SITE_OTHER): Payer: Medicare Other | Admitting: Internal Medicine

## 2014-10-12 ENCOUNTER — Encounter: Payer: Self-pay | Admitting: Internal Medicine

## 2014-10-12 ENCOUNTER — Other Ambulatory Visit (INDEPENDENT_AMBULATORY_CARE_PROVIDER_SITE_OTHER): Payer: Medicare Other

## 2014-10-12 VITALS — BP 128/80 | HR 65 | Temp 98.3°F | Resp 18 | Ht 62.0 in | Wt 200.0 lb

## 2014-10-12 DIAGNOSIS — I10 Essential (primary) hypertension: Secondary | ICD-10-CM | POA: Diagnosis not present

## 2014-10-12 DIAGNOSIS — E785 Hyperlipidemia, unspecified: Secondary | ICD-10-CM | POA: Diagnosis not present

## 2014-10-12 DIAGNOSIS — E119 Type 2 diabetes mellitus without complications: Secondary | ICD-10-CM

## 2014-10-12 LAB — LIPID PANEL
CHOL/HDL RATIO: 3
Cholesterol: 155 mg/dL (ref 0–200)
HDL: 47.8 mg/dL (ref >39.00)
LDL Cholesterol: 78 mg/dL (ref 0–99)
NONHDL: 107.2
Triglycerides: 146 mg/dL (ref 0.0–149.0)
VLDL: 29.2 mg/dL (ref 0.0–40.0)

## 2014-10-12 LAB — BASIC METABOLIC PANEL
BUN: 10 mg/dL (ref 6–23)
CHLORIDE: 99 meq/L (ref 96–112)
CO2: 31 mEq/L (ref 19–32)
Calcium: 10.1 mg/dL (ref 8.4–10.5)
Creatinine, Ser: 0.7 mg/dL (ref 0.40–1.20)
GFR: 88.6 mL/min (ref >60.00)
Glucose, Bld: 94 mg/dL (ref 70–99)
POTASSIUM: 4.3 meq/L (ref 3.5–5.1)
Sodium: 137 mEq/L (ref 135–145)

## 2014-10-12 LAB — HEPATIC FUNCTION PANEL
ALK PHOS: 62 U/L (ref 39–117)
ALT: 22 U/L (ref 0–35)
AST: 23 U/L (ref 0–37)
Albumin: 4.4 g/dL (ref 3.5–5.2)
Bilirubin, Direct: 0.1 mg/dL (ref 0.0–0.3)
Total Bilirubin: 0.6 mg/dL (ref 0.2–1.2)
Total Protein: 7.8 g/dL (ref 6.0–8.3)

## 2014-10-12 LAB — HEMOGLOBIN A1C: Hgb A1c MFr Bld: 6.4 % (ref 4.6–6.5)

## 2014-10-12 MED ORDER — PRAVASTATIN SODIUM 80 MG PO TABS: 80.0000 mg | ORAL_TABLET | Freq: Every day | ORAL | Status: DC

## 2014-10-12 NOTE — Assessment & Plan Note (Addendum)
stable overall by history and exam, recent data reviewed with pt, and pt to continue medical treatment as before,  to f/u any worsening symptoms or concerns Lab Results  Component Value Date   HGBA1C 6.2 04/13/2014   For f/u labs

## 2014-10-12 NOTE — Assessment & Plan Note (Signed)
stable overall by history and exam, recent data reviewed with pt, and pt to continue medical treatment as before,  to f/u any worsening symptoms or concerns BP Readings from Last 3 Encounters:  10/12/14 128/80  05/24/14 118/80  04/26/14 126/80

## 2014-10-12 NOTE — Progress Notes (Signed)
Pre visit review using our clinic review tool, if applicable. No additional management support is needed unless otherwise documented below in the visit note. 

## 2014-10-12 NOTE — Assessment & Plan Note (Signed)
stable overall by history and exam, recent data reviewed with pt,  to f/u any worsening symptoms or concerns Lab Results  Component Value Date   LDLCALC 127* 04/13/2014   Ok to change to the lipitor - d/w pt

## 2014-10-12 NOTE — Patient Instructions (Signed)
OK to stop the pravastatin  OK to take the atorvastatin (lipitor) at 20 mg per day  Please continue all other medications as before, and refills have been done if requested.  Please have the pharmacy call with any other refills you may need.  Please continue your efforts at being more active, low cholesterol diabetic diet, and weight control.  Please keep your appointments with your specialists as you may have planned  Please go to the LAB in the Basement (turn left off the elevator) for the tests to be done today  You will be contacted by phone if any changes need to be made immediately.  Otherwise, you will receive a letter about your results with an explanation, but please check with MyChart first.  Please remember to sign up for MyChart if you have not done so, as this will be important to you in the future with finding out test results, communicating by private email, and scheduling acute appointments online when needed.  Please return in 6 months, or sooner if needed

## 2014-10-12 NOTE — Progress Notes (Signed)
Subjective:    Patient ID: Bridget Grant, female    DOB: 10-22-1945, 69 y.o.   MRN: [id number]  HPI  Here to f/u; overall doing ok,  Pt denies chest pain, increasing sob or doe, wheezing, orthopnea, PND, increased LE swelling, palpitations, dizziness or syncope.  Pt denies new neurological symptoms such as new headache, or facial or extremity weakness or numbness.  Pt denies polydipsia, polyuria, or low sugar episode.   Pt denies new neurological symptoms such as new headache, or facial or extremity weakness or numbness.   Pt states overall good compliance with meds, mostly trying to follow appropriate diet, with wt overall stable,  but little exercise however. Wt Readings from Last 3 Encounters:  10/12/14 200 lb 0.6 oz (90.738 kg)  05/24/14 201 lb (91.173 kg)  04/26/14 198 lb (89.812 kg)  Was some confused about her meds, has still been taking the pravastatin and not taking the lipitor, though she has the bottle with pills Past Medical History  Diagnosis Date  . Hypertension   . Osteopenia   . Depression   . Anxiety   . Hyperlipidemia   . Migraine   . Type II or unspecified type diabetes mellitus without mention of complication, uncontrolled 04/09/2013   Past Surgical History  Procedure Laterality Date  . Abdominal hysterectomy      reports that she has never smoked. She has never used smokeless tobacco. She reports that she does not drink alcohol or use illicit drugs. family history includes Cancer in her mother; Hypertension in her father. No Known Allergies Current Outpatient Prescriptions on File Prior to Visit  Medication Sig Dispense Refill  . aspirin EC 81 MG tablet Take 81 mg by mouth daily.    . Diclofenac Sodium 2 % SOLN Apply 1 pump twice daily. 112 g 3  . diltiazem (CARDIZEM CD) 240 MG 24 hr capsule Take 1 capsule (240 mg total) by mouth daily. 90 capsule 3  . hydrochlorothiazide (MICROZIDE) 12.5 MG capsule Take 1 capsule (12.5 mg total) by mouth daily. 90 capsule 3    . lisinopril (PRINIVIL,ZESTRIL) 20 MG tablet Take 1 tablet (20 mg total) by mouth daily. 90 tablet 3  . traMADol (ULTRAM) 50 MG tablet Take 1 tablet (50 mg total) by mouth every 6 (six) hours as needed. 120 tablet 0  . [DISCONTINUED] diltiazem (TIAZAC) 240 MG 24 hr capsule Take 1 capsule (240 mg total) by mouth daily. 90 capsule 3   No current facility-administered medications on file prior to visit.    Review of Systems  Constitutional: Negative for unusual diaphoresis or night sweats HENT: Negative for ringing in ear or discharge Eyes: Negative for double vision or worsening visual disturbance.  Respiratory: Negative for choking and stridor.   Gastrointestinal: Negative for vomiting or other signifcant bowel change Genitourinary: Negative for hematuria or change in urine volume.  Musculoskeletal: Negative for other MSK pain or swelling Skin: Negative for color change and worsening wound.  Neurological: Negative for tremors and numbness other than noted  Psychiatric/Behavioral: Negative for decreased concentration or agitation other than above  '    Objective:   Physical Exam BP 128/80 mmHg  Pulse 65  Temp(Src) 98.3 F (36.8 C) (Oral)  Resp 18  Ht 5\' 2"  (1.575 m)  Wt 200 lb 0.6 oz (90.738 kg)  BMI 36.58 kg/m2  SpO2 97% VS noted,  Constitutional: Pt appears in no significant distress HENT: Head: NCAT.  Right Ear: External ear normal.  Left Ear: External ear  normal.  Eyes: . Pupils are equal, round, and reactive to light. Conjunctivae and EOM are normal Neck: Normal range of motion. Neck supple.  Cardiovascular: Normal rate and regular rhythm.   Pulmonary/Chest: Effort normal and breath sounds without rales or wheezing.  Abd:  Soft, NT, ND, + BS Neurological: Pt is alert. Not confused , motor grossly intact Skin: Skin is warm. No rash, no LE edema Psychiatric: Pt behavior is normal. No agitation.      Assessment & Plan:

## 2014-10-13 ENCOUNTER — Encounter: Payer: Self-pay | Admitting: Internal Medicine

## 2014-12-30 ENCOUNTER — Telehealth: Payer: Self-pay | Admitting: Geriatric Medicine

## 2014-12-30 NOTE — Telephone Encounter (Signed)
Left message for patient to call back. Need to ask if she has had a mammogram recently

## 2015-04-14 ENCOUNTER — Encounter: Payer: Self-pay | Admitting: Internal Medicine

## 2015-04-14 ENCOUNTER — Ambulatory Visit (INDEPENDENT_AMBULATORY_CARE_PROVIDER_SITE_OTHER): Payer: Medicare Other | Admitting: Internal Medicine

## 2015-04-14 ENCOUNTER — Other Ambulatory Visit (INDEPENDENT_AMBULATORY_CARE_PROVIDER_SITE_OTHER): Payer: Medicare Other

## 2015-04-14 VITALS — BP 130/84 | HR 61 | Temp 98.1°F | Ht 62.0 in | Wt 210.0 lb

## 2015-04-14 DIAGNOSIS — D179 Benign lipomatous neoplasm, unspecified: Secondary | ICD-10-CM

## 2015-04-14 DIAGNOSIS — Z20828 Contact with and (suspected) exposure to other viral communicable diseases: Secondary | ICD-10-CM

## 2015-04-14 DIAGNOSIS — E119 Type 2 diabetes mellitus without complications: Secondary | ICD-10-CM

## 2015-04-14 DIAGNOSIS — Z23 Encounter for immunization: Secondary | ICD-10-CM | POA: Diagnosis not present

## 2015-04-14 DIAGNOSIS — I10 Essential (primary) hypertension: Secondary | ICD-10-CM

## 2015-04-14 DIAGNOSIS — E785 Hyperlipidemia, unspecified: Secondary | ICD-10-CM

## 2015-04-14 LAB — BASIC METABOLIC PANEL
BUN: 12 mg/dL (ref 6–23)
CHLORIDE: 102 meq/L (ref 96–112)
CO2: 26 meq/L (ref 19–32)
CREATININE: 0.7 mg/dL (ref 0.40–1.20)
Calcium: 9.6 mg/dL (ref 8.4–10.5)
GFR: 88.47 mL/min (ref >60.00)
Glucose, Bld: 102 mg/dL — ABNORMAL HIGH (ref 70–99)
POTASSIUM: 3.9 meq/L (ref 3.5–5.1)
Sodium: 137 mEq/L (ref 135–145)

## 2015-04-14 LAB — CBC WITH DIFFERENTIAL/PLATELET
BASOS PCT: 0.4 % (ref 0.0–3.0)
Basophils Absolute: 0 10*3/uL (ref 0.0–0.1)
Eosinophils Absolute: 0.2 10*3/uL (ref 0.0–0.7)
Eosinophils Relative: 2.8 % (ref 0.0–5.0)
HEMATOCRIT: 42.4 % (ref 36.0–46.0)
HEMOGLOBIN: 14.3 g/dL (ref 12.0–15.0)
Lymphocytes Relative: 32.3 % (ref 12.0–46.0)
Lymphs Abs: 2.7 10*3/uL (ref 0.7–4.0)
MCHC: 33.7 g/dL (ref 30.0–36.0)
MCV: 94.5 fl (ref 78.0–100.0)
MONO ABS: 0.9 10*3/uL (ref 0.1–1.0)
Monocytes Relative: 11.4 % (ref 3.0–12.0)
NEUTROS ABS: 4.4 10*3/uL (ref 1.4–7.7)
Neutrophils Relative %: 53.1 % (ref 43.0–77.0)
Platelets: 215 10*3/uL (ref 150.0–400.0)
RBC: 4.49 Mil/uL (ref 3.87–5.11)
RDW: 13 % (ref 11.5–15.5)
WBC: 8.2 10*3/uL (ref 4.0–10.5)

## 2015-04-14 LAB — HEPATIC FUNCTION PANEL
ALT: 26 U/L (ref 0–35)
AST: 24 U/L (ref 0–37)
Albumin: 4.1 g/dL (ref 3.5–5.2)
Alkaline Phosphatase: 59 U/L (ref 39–117)
BILIRUBIN DIRECT: 0.1 mg/dL (ref 0.0–0.3)
BILIRUBIN TOTAL: 0.6 mg/dL (ref 0.2–1.2)
TOTAL PROTEIN: 7.3 g/dL (ref 6.0–8.3)

## 2015-04-14 LAB — MICROALBUMIN / CREATININE URINE RATIO
Creatinine,U: 65.3 mg/dL
Microalb Creat Ratio: 1.1 mg/g (ref 0.0–30.0)

## 2015-04-14 LAB — LIPID PANEL
CHOL/HDL RATIO: 3
Cholesterol: 124 mg/dL (ref 0–200)
HDL: 39.9 mg/dL (ref >39.00)
LDL Cholesterol: 62 mg/dL (ref 0–99)
NonHDL: 84.59
TRIGLYCERIDES: 112 mg/dL (ref 0.0–149.0)
VLDL: 22.4 mg/dL (ref 0.0–40.0)

## 2015-04-14 LAB — URINALYSIS, ROUTINE W REFLEX MICROSCOPIC
BILIRUBIN URINE: NEGATIVE
HGB URINE DIPSTICK: NEGATIVE
Ketones, ur: NEGATIVE
LEUKOCYTES UA: NEGATIVE
NITRITE: NEGATIVE
RBC / HPF: NONE SEEN (ref 0–?)
Specific Gravity, Urine: 1.01 (ref 1.000–1.030)
TOTAL PROTEIN, URINE-UPE24: NEGATIVE
Urine Glucose: NEGATIVE
Urobilinogen, UA: 0.2 (ref 0.0–1.0)
pH: 6 (ref 5.0–8.0)

## 2015-04-14 LAB — HEMOGLOBIN A1C: HEMOGLOBIN A1C: 7.2 % — AB (ref 4.6–6.5)

## 2015-04-14 LAB — TSH: TSH: 1.49 u[IU]/mL (ref 0.35–4.50)

## 2015-04-14 MED ORDER — DILTIAZEM HCL ER COATED BEADS 240 MG PO CP24: 240.0000 mg | ORAL_CAPSULE | Freq: Every day | ORAL | Status: DC

## 2015-04-14 MED ORDER — LISINOPRIL 20 MG PO TABS: 20.0000 mg | ORAL_TABLET | Freq: Every day | ORAL | Status: DC

## 2015-04-14 MED ORDER — HYDROCHLOROTHIAZIDE 12.5 MG PO CAPS: 12.5000 mg | ORAL_CAPSULE | Freq: Every day | ORAL | Status: DC

## 2015-04-14 NOTE — Progress Notes (Signed)
Pre visit review using our clinic review tool, if applicable. No additional management support is needed unless otherwise documented below in the visit note. 

## 2015-04-14 NOTE — Progress Notes (Signed)
Subjective:    Patient ID: Bridget Grant, female    DOB: 08-Jan-1946, 69 y.o.   MRN: [id number]  HPI  Here for yearly f/u;  Overall doing ok;  Pt denies Chest pain, worsening SOB, DOE, wheezing, orthopnea, PND, worsening LE edema, palpitations, dizziness or syncope.  Pt denies neurological change such as new headache, facial or extremity weakness.  Pt denies polydipsia, polyuria, or low sugar symptoms. Pt states overall good compliance with treatment and medications, good tolerability, and has been trying to follow appropriate diet.  Pt denies worsening depressive symptoms, suicidal ideation or panic. No fever, night sweats, wt loss, loss of appetite, or other constitutional symptoms.  Pt states good ability with ADL's, has low fall risk, home safety reviewed and adequate, no other significant changes in hearing or vision, and only occasionally active with exercise.  Does have several wks ongoing nasal allergy symptoms with clearish congestion, itch and sneezing, without fever, pain, ST, cough, swelling or wheezing, worse early in the AM for 2 wks. Right ear seems to close up at times, off and on, but no pain, fever and hearing ok. Due for flu shot, and hep c testing Past Medical History  Diagnosis Date  . Hypertension   . Osteopenia   . Depression   . Anxiety   . Hyperlipidemia   . Migraine   . Type II or unspecified type diabetes mellitus without mention of complication, uncontrolled 04/09/2013   Past Surgical History  Procedure Laterality Date  . Abdominal hysterectomy      reports that she has never smoked. She has never used smokeless tobacco. She reports that she does not drink alcohol or use illicit drugs. family history includes Cancer in her mother; Hypertension in her father. No Known Allergies Current Outpatient Prescriptions on File Prior to Visit  Medication Sig Dispense Refill  . aspirin EC 81 MG tablet Take 81 mg by mouth daily.    Marland Kitchen diltiazem (CARDIZEM CD) 240 MG 24 hr  capsule Take 1 capsule (240 mg total) by mouth daily. 90 capsule 3  . lisinopril (PRINIVIL,ZESTRIL) 20 MG tablet Take 1 tablet (20 mg total) by mouth daily. 90 tablet 3  . pravastatin (PRAVACHOL) 80 MG tablet Take 1 tablet (80 mg total) by mouth daily. 90 tablet 3  . Diclofenac Sodium 2 % SOLN Apply 1 pump twice daily. (Patient not taking: Reported on 04/14/2015) 112 g 3  . hydrochlorothiazide (MICROZIDE) 12.5 MG capsule Take 1 capsule (12.5 mg total) by mouth daily. (Patient not taking: Reported on 04/14/2015) 90 capsule 3  . traMADol (ULTRAM) 50 MG tablet Take 1 tablet (50 mg total) by mouth every 6 (six) hours as needed. (Patient not taking: Reported on 04/14/2015) 120 tablet 0  . [DISCONTINUED] diltiazem (TIAZAC) 240 MG 24 hr capsule Take 1 capsule (240 mg total) by mouth daily. 90 capsule 3   No current facility-administered medications on file prior to visit.   Review of Systems Constitutional: Negative for increased diaphoresis, other activity, appetite or siginficant weight change other than noted HENT: Negative for worsening hearing loss, ear pain, facial swelling, mouth sores and neck stiffness.   Eyes: Negative for other worsening pain, redness or visual disturbance.  Respiratory: Negative for shortness of breath and wheezing  Cardiovascular: Negative for chest pain and palpitations.  Gastrointestinal: Negative for diarrhea, blood in stool, abdominal distention or other pain Genitourinary: Negative for hematuria, flank pain or change in urine volume.  Musculoskeletal: Negative for myalgias or other joint complaints.  Skin:  Negative for color change and wound or drainage.  Neurological: Negative for syncope and numbness. other than noted Hematological: Negative for adenopathy. or other swelling Psychiatric/Behavioral: Negative for hallucinations, SI, self-injury, decreased concentration or other worsening agitation.      Objective:   Physical Exam BP 130/84 mmHg  Pulse 61   Temp(Src) 98.1 F (36.7 C) (Oral)  Ht 5\' 2"  (1.575 m)  Wt 210 lb (95.255 kg)  BMI 38.40 kg/m2  SpO2 95% VS noted,  Constitutional: Pt is oriented to person, place, and time. Appears well-developed and well-nourished, in no significant distress Head: Normocephalic and atraumatic.  Right Ear: External ear normal.  Left Ear: External ear normal.  Nose: Nose normal.  Mouth/Throat: Oropharynx is clear and moist.  Eyes: Conjunctivae and EOM are normal. Pupils are equal, round, and reactive to light.  Neck: Normal range of motion. Neck supple. No JVD present. No tracheal deviation present or significant neck LA or mass Cardiovascular: Normal rate, regular rhythm, normal heart sounds and intact distal pulses.   Pulmonary/Chest: Effort normal and breath sounds without rales or wheezing  Abdominal: Soft. Bowel sounds are normal. NT. No HSM  Musculoskeletal: Normal range of motion. Exhibits no edema.  Lymphadenopathy:  Has no cervical adenopathy.  Neurological: Pt is alert and oriented to person, place, and time. Pt has normal reflexes. No cranial nerve deficit. Motor grossly intact Skin: Skin is warm and dry. No rash noted. Large lipoma low cervical/highthoracic midline noted Psychiatric:  Has normal mood and affect. Behavior is normal.      Assessment & Plan:

## 2015-04-14 NOTE — Assessment & Plan Note (Signed)
Diet controlled, stable overall by history and exam, recent data reviewed with pt, and pt to continue medical treatment as before,  to f/u any worsening symptoms or concerns Lab Results  Component Value Date   HGBA1C 6.4 10/12/2014   For f/u lab

## 2015-04-14 NOTE — Assessment & Plan Note (Signed)
stable overall by history and exam, recent data reviewed with pt, and pt to continue medical treatment as before,  to f/u any worsening symptoms or concerns BP Readings from Last 3 Encounters:  04/14/15 130/84  10/12/14 128/80  05/24/14 118/80

## 2015-04-14 NOTE — Assessment & Plan Note (Signed)
stable overall by history and exam, recent data reviewed with pt, and pt to continue medical treatment as before,  to f/u any worsening symptoms or concerns Lab Results  Component Value Date   LDLCALC 78 10/12/2014  pt now on high dose pravastatin, consider change to crestor if uncontrolled, for f/u lab today

## 2015-04-14 NOTE — Patient Instructions (Addendum)

## 2015-04-14 NOTE — Assessment & Plan Note (Signed)
Increased in size, d/w pt, declines gen surgury referral at this time

## 2015-04-15 LAB — HEPATITIS C ANTIBODY: HCV Ab: NEGATIVE

## 2015-05-23 DIAGNOSIS — L918 Other hypertrophic disorders of the skin: Secondary | ICD-10-CM | POA: Diagnosis not present

## 2015-05-23 DIAGNOSIS — D2261 Melanocytic nevi of right upper limb, including shoulder: Secondary | ICD-10-CM | POA: Diagnosis not present

## 2015-05-23 DIAGNOSIS — D2239 Melanocytic nevi of other parts of face: Secondary | ICD-10-CM | POA: Diagnosis not present

## 2015-05-23 DIAGNOSIS — D1801 Hemangioma of skin and subcutaneous tissue: Secondary | ICD-10-CM | POA: Diagnosis not present

## 2015-05-23 DIAGNOSIS — D2262 Melanocytic nevi of left upper limb, including shoulder: Secondary | ICD-10-CM | POA: Diagnosis not present

## 2015-05-23 DIAGNOSIS — L821 Other seborrheic keratosis: Secondary | ICD-10-CM | POA: Diagnosis not present

## 2015-05-23 DIAGNOSIS — I872 Venous insufficiency (chronic) (peripheral): Secondary | ICD-10-CM | POA: Diagnosis not present

## 2015-05-23 DIAGNOSIS — I8312 Varicose veins of left lower extremity with inflammation: Secondary | ICD-10-CM | POA: Diagnosis not present

## 2015-05-23 DIAGNOSIS — I8311 Varicose veins of right lower extremity with inflammation: Secondary | ICD-10-CM | POA: Diagnosis not present

## 2015-05-23 DIAGNOSIS — D225 Melanocytic nevi of trunk: Secondary | ICD-10-CM | POA: Diagnosis not present

## 2015-05-25 ENCOUNTER — Other Ambulatory Visit: Payer: Self-pay | Admitting: Internal Medicine

## 2015-10-12 ENCOUNTER — Ambulatory Visit (INDEPENDENT_AMBULATORY_CARE_PROVIDER_SITE_OTHER): Payer: Medicare Other | Admitting: Internal Medicine

## 2015-10-12 ENCOUNTER — Encounter: Payer: Self-pay | Admitting: Internal Medicine

## 2015-10-12 ENCOUNTER — Other Ambulatory Visit (INDEPENDENT_AMBULATORY_CARE_PROVIDER_SITE_OTHER): Payer: Medicare Other

## 2015-10-12 VITALS — BP 128/80 | HR 65 | Temp 98.0°F | Resp 20 | Wt 210.0 lb

## 2015-10-12 DIAGNOSIS — E119 Type 2 diabetes mellitus without complications: Secondary | ICD-10-CM

## 2015-10-12 DIAGNOSIS — F329 Major depressive disorder, single episode, unspecified: Secondary | ICD-10-CM | POA: Diagnosis not present

## 2015-10-12 DIAGNOSIS — I10 Essential (primary) hypertension: Secondary | ICD-10-CM

## 2015-10-12 DIAGNOSIS — E785 Hyperlipidemia, unspecified: Secondary | ICD-10-CM | POA: Diagnosis not present

## 2015-10-12 DIAGNOSIS — F32A Depression, unspecified: Secondary | ICD-10-CM

## 2015-10-12 LAB — BASIC METABOLIC PANEL
BUN: 16 mg/dL (ref 6–23)
CHLORIDE: 101 meq/L (ref 96–112)
CO2: 29 mEq/L (ref 19–32)
Calcium: 10.1 mg/dL (ref 8.4–10.5)
Creatinine, Ser: 0.73 mg/dL (ref 0.40–1.20)
GFR: 84.16 mL/min (ref >60.00)
Glucose, Bld: 107 mg/dL — ABNORMAL HIGH (ref 70–99)
POTASSIUM: 4.3 meq/L (ref 3.5–5.1)
Sodium: 138 mEq/L (ref 135–145)

## 2015-10-12 LAB — LIPID PANEL
CHOL/HDL RATIO: 3
Cholesterol: 163 mg/dL (ref 0–200)
HDL: 51.3 mg/dL (ref >39.00)
LDL Cholesterol: 73 mg/dL (ref 0–99)
NONHDL: 112.11
Triglycerides: 197 mg/dL — ABNORMAL HIGH (ref 0.0–149.0)
VLDL: 39.4 mg/dL (ref 0.0–40.0)

## 2015-10-12 LAB — HEPATIC FUNCTION PANEL
ALT: 20 U/L (ref 0–35)
AST: 21 U/L (ref 0–37)
Albumin: 4.5 g/dL (ref 3.5–5.2)
Alkaline Phosphatase: 51 U/L (ref 39–117)
BILIRUBIN DIRECT: 0.1 mg/dL (ref 0.0–0.3)
BILIRUBIN TOTAL: 0.5 mg/dL (ref 0.2–1.2)
Total Protein: 7.9 g/dL (ref 6.0–8.3)

## 2015-10-12 LAB — HEMOGLOBIN A1C: Hgb A1c MFr Bld: 7.4 % — ABNORMAL HIGH (ref 4.6–6.5)

## 2015-10-12 NOTE — Assessment & Plan Note (Signed)
stable overall by history and exam, recent data reviewed with pt, and pt to continue medical treatment as before,  to f/u any worsening symptoms or concerns Lab Results  Component Value Date   LDLCALC 62 04/14/2015

## 2015-10-12 NOTE — Progress Notes (Signed)
Pre visit review using our clinic review tool, if applicable. No additional management support is needed unless otherwise documented below in the visit note. 

## 2015-10-12 NOTE — Patient Instructions (Signed)
You had the Pneumovax pneumonia shot today  Please continue all other medications as before, and refills have been done if requested.  Please have the pharmacy call with any other refills you may need.  Please continue your efforts at being more active, low cholesterol diet, and weight control.  You are otherwise up to date with prevention measures today.  Please keep your appointments with your specialists as you may have planned  Please go to the LAB in the Basement (turn left off the elevator) for the tests to be done today  You will be contacted by phone if any changes need to be made immediately.  Otherwise, you will receive a letter about your results with an explanation, but please check with MyChart first.  Please remember to sign up for MyChart if you have not done so, as this will be important to you in the future with finding out test results, communicating by private email, and scheduling acute appointments online when needed.  Please return in 6 months, or sooner if needed 

## 2015-10-12 NOTE — Assessment & Plan Note (Signed)
stable overall by history and exam, recent data reviewed with pt, and pt to continue medical treatment as before,  to f/u any worsening symptoms or concerns BP Readings from Last 3 Encounters:  10/12/15 128/80  04/14/15 130/84  10/12/14 128/80

## 2015-10-12 NOTE — Assessment & Plan Note (Signed)
stable overall by history and exam, recent data reviewed with pt, and pt to continue medical treatment as before,  to f/u any worsening symptoms or concerns Lab Results  Component Value Date   HGBA1C 7.2* 04/14/2015

## 2015-10-12 NOTE — Assessment & Plan Note (Signed)
stable overall by history and exam, recent data reviewed with pt, and pt to continue medical treatment as before,  to f/u any worsening symptoms or concerns Lab Results  Component Value Date   WBC 8.2 04/14/2015   HGB 14.3 04/14/2015   HCT 42.4 04/14/2015   PLT 215.0 04/14/2015   GLUCOSE 102* 04/14/2015   CHOL 124 04/14/2015   TRIG 112.0 04/14/2015   HDL 39.90 04/14/2015   LDLDIRECT 141.1 07/08/2007   LDLCALC 62 04/14/2015   ALT 26 04/14/2015   AST 24 04/14/2015   NA 137 04/14/2015   K 3.9 04/14/2015   CL 102 04/14/2015   CREATININE 0.70 04/14/2015   BUN 12 04/14/2015   CO2 26 04/14/2015   TSH 1.49 04/14/2015   HGBA1C 7.2* 04/14/2015   MICROALBUR <0.7 04/14/2015

## 2015-10-12 NOTE — Progress Notes (Signed)
Subjective:    Patient ID: Bridget Grant, female    DOB: [birth year], 70 y.o.   MRN: [id number]  HPI  Here to f/u; overall doing ok,  Pt denies chest pain, increasing sob or doe, wheezing, orthopnea, PND, increased LE swelling, palpitations, dizziness or syncope.  Pt denies new neurological symptoms such as new headache, or facial or extremity weakness or numbness.  Pt denies polydipsia, polyuria, or low sugar episode.   Pt denies new neurological symptoms such as new headache, or facial or extremity weakness or numbness.   Pt states overall good compliance with meds, mostly trying to follow appropriate diet, with wt overall stable,  but little exercise however. Not as active recently due to knee pain bilat, has been seeing ortho as well. Denies worsening depressive symptoms, suicidal ideation, or panic Past Medical History  Diagnosis Date  . Hypertension   . Osteopenia   . Depression   . Anxiety   . Hyperlipidemia   . Migraine   . Type II or unspecified type diabetes mellitus without mention of complication, uncontrolled 04/09/2013   Past Surgical History  Procedure Laterality Date  . Abdominal hysterectomy      reports that she has never smoked. She has never used smokeless tobacco. She reports that she does not drink alcohol or use illicit drugs. family history includes Cancer in her mother; Hypertension in her father. No Known Allergies Current Outpatient Prescriptions on File Prior to Visit  Medication Sig Dispense Refill  . aspirin EC 81 MG tablet Take 81 mg by mouth daily.    Marland Kitchen atorvastatin (LIPITOR) 20 MG tablet take 1 tablet by mouth once daily 90 tablet 3  . Diclofenac Sodium 2 % SOLN Apply 1 pump twice daily. 112 g 3  . diltiazem (CARDIZEM CD) 240 MG 24 hr capsule Take 1 capsule (240 mg total) by mouth daily. 90 capsule 3  . hydrochlorothiazide (MICROZIDE) 12.5 MG capsule Take 1 capsule (12.5 mg total) by mouth daily. 90 capsule 3  . lisinopril (PRINIVIL,ZESTRIL) 20 MG  tablet Take 1 tablet (20 mg total) by mouth daily. 90 tablet 3  . pravastatin (PRAVACHOL) 80 MG tablet Take 1 tablet (80 mg total) by mouth daily. 90 tablet 3  . [DISCONTINUED] diltiazem (TIAZAC) 240 MG 24 hr capsule Take 1 capsule (240 mg total) by mouth daily. 90 capsule 3   No current facility-administered medications on file prior to visit.   Review of Systems  Constitutional: Negative for unusual diaphoresis or night sweats HENT: Negative for ear swelling or discharge Eyes: Negative for worsening visual haziness  Respiratory: Negative for choking and stridor.   Gastrointestinal: Negative for distension or worsening eructation Genitourinary: Negative for retention or change in urine volume.  Musculoskeletal: Negative for other MSK pain or swelling Skin: Negative for color change and worsening wound Neurological: Negative for tremors and numbness other than noted  Psychiatric/Behavioral: Negative for decreased concentration or agitation other than above       Objective:   Physical Exam BP 128/80 mmHg  Pulse 65  Temp(Src) 98 F (36.7 C) (Oral)  Resp 20  Wt 210 lb (95.255 kg)  SpO2 98% VS noted,  Constitutional: Pt appears in no apparent distress HENT: Head: NCAT.  Right Ear: External ear normal.  Left Ear: External ear normal.  Eyes: . Pupils are equal, round, and reactive to light. Conjunctivae and EOM are normal Neck: Normal range of motion. Neck supple.  Cardiovascular: Normal rate and regular rhythm.   Pulmonary/Chest: Effort normal  and breath sounds without rales or wheezing.  Abd:  Soft, NT, ND, + BS Neurological: Pt is alert. Not confused , motor grossly intact Skin: Skin is warm. No rash, no LE edema Psychiatric: Pt behavior is normal. No agitation. not depressed affect    Assessment & Plan:

## 2015-10-13 ENCOUNTER — Other Ambulatory Visit: Payer: Self-pay | Admitting: Internal Medicine

## 2015-10-13 ENCOUNTER — Encounter: Payer: Self-pay | Admitting: Internal Medicine

## 2015-10-13 MED ORDER — METFORMIN HCL ER 500 MG PO TB24: 500.0000 mg | ORAL_TABLET | Freq: Every day | ORAL | Status: DC

## 2016-04-16 ENCOUNTER — Ambulatory Visit: Payer: Medicare Other | Admitting: Internal Medicine

## 2016-04-17 ENCOUNTER — Encounter: Payer: Self-pay | Admitting: Internal Medicine

## 2016-04-17 ENCOUNTER — Ambulatory Visit (INDEPENDENT_AMBULATORY_CARE_PROVIDER_SITE_OTHER): Payer: Medicare Other | Admitting: Internal Medicine

## 2016-04-17 ENCOUNTER — Other Ambulatory Visit: Payer: Self-pay | Admitting: Internal Medicine

## 2016-04-17 ENCOUNTER — Other Ambulatory Visit (INDEPENDENT_AMBULATORY_CARE_PROVIDER_SITE_OTHER): Payer: Medicare Other

## 2016-04-17 VITALS — BP 128/72 | HR 73 | Temp 98.0°F | Resp 20 | Wt 211.0 lb

## 2016-04-17 DIAGNOSIS — E785 Hyperlipidemia, unspecified: Secondary | ICD-10-CM

## 2016-04-17 DIAGNOSIS — E119 Type 2 diabetes mellitus without complications: Secondary | ICD-10-CM

## 2016-04-17 DIAGNOSIS — I10 Essential (primary) hypertension: Secondary | ICD-10-CM

## 2016-04-17 LAB — BASIC METABOLIC PANEL
BUN: 21 mg/dL (ref 6–23)
CO2: 31 meq/L (ref 19–32)
Calcium: 10 mg/dL (ref 8.4–10.5)
Chloride: 100 mEq/L (ref 96–112)
Creatinine, Ser: 0.81 mg/dL (ref 0.40–1.20)
GFR: 74.53 mL/min (ref >60.00)
GLUCOSE: 108 mg/dL — AB (ref 70–99)
POTASSIUM: 4.4 meq/L (ref 3.5–5.1)
SODIUM: 137 meq/L (ref 135–145)

## 2016-04-17 LAB — CBC WITH DIFFERENTIAL/PLATELET
Basophils Absolute: 0 10*3/uL (ref 0.0–0.1)
Basophils Relative: 0 % (ref 0.0–3.0)
EOS PCT: 2.1 % (ref 0.0–5.0)
Eosinophils Absolute: 0.2 10*3/uL (ref 0.0–0.7)
HCT: 43.1 % (ref 36.0–46.0)
Hemoglobin: 14.5 g/dL (ref 12.0–15.0)
LYMPHS ABS: 2.7 10*3/uL (ref 0.7–4.0)
Lymphocytes Relative: 25.3 % (ref 12.0–46.0)
MCHC: 33.8 g/dL (ref 30.0–36.0)
MCV: 94.6 fl (ref 78.0–100.0)
MONO ABS: 1 10*3/uL (ref 0.1–1.0)
Monocytes Relative: 9.4 % (ref 3.0–12.0)
NEUTROS ABS: 6.7 10*3/uL (ref 1.4–7.7)
NEUTROS PCT: 63.2 % (ref 43.0–77.0)
PLATELETS: 222 10*3/uL (ref 150.0–400.0)
RBC: 4.55 Mil/uL (ref 3.87–5.11)
RDW: 13 % (ref 11.5–15.5)
WBC: 10.7 10*3/uL — ABNORMAL HIGH (ref 4.0–10.5)

## 2016-04-17 LAB — URINALYSIS, ROUTINE W REFLEX MICROSCOPIC
Bilirubin Urine: NEGATIVE
HGB URINE DIPSTICK: NEGATIVE
Ketones, ur: NEGATIVE
LEUKOCYTES UA: NEGATIVE
Nitrite: NEGATIVE
SPECIFIC GRAVITY, URINE: 1.01 (ref 1.000–1.030)
Total Protein, Urine: NEGATIVE
URINE GLUCOSE: NEGATIVE
Urobilinogen, UA: 0.2 (ref 0.0–1.0)
pH: 6 (ref 5.0–8.0)

## 2016-04-17 LAB — HEPATIC FUNCTION PANEL
ALBUMIN: 4.4 g/dL (ref 3.5–5.2)
ALK PHOS: 53 U/L (ref 39–117)
ALT: 21 U/L (ref 0–35)
AST: 21 U/L (ref 0–37)
BILIRUBIN DIRECT: 0.1 mg/dL (ref 0.0–0.3)
TOTAL PROTEIN: 7.8 g/dL (ref 6.0–8.3)
Total Bilirubin: 0.5 mg/dL (ref 0.2–1.2)

## 2016-04-17 LAB — LIPID PANEL
CHOLESTEROL: 176 mg/dL (ref 0–200)
HDL: 47.8 mg/dL (ref >39.00)
LDL Cholesterol: 94 mg/dL (ref 0–99)
NonHDL: 127.97
Total CHOL/HDL Ratio: 4
Triglycerides: 168 mg/dL — ABNORMAL HIGH (ref 0.0–149.0)
VLDL: 33.6 mg/dL (ref 0.0–40.0)

## 2016-04-17 LAB — MICROALBUMIN / CREATININE URINE RATIO
CREATININE, U: 69.3 mg/dL
MICROALB/CREAT RATIO: 1 mg/g (ref 0.0–30.0)

## 2016-04-17 LAB — HEMOGLOBIN A1C: HEMOGLOBIN A1C: 7.3 % — AB (ref 4.6–6.5)

## 2016-04-17 LAB — TSH: TSH: 2.26 u[IU]/mL (ref 0.35–4.50)

## 2016-04-17 MED ORDER — METFORMIN HCL ER 500 MG PO TB24: 1000.0000 mg | ORAL_TABLET | Freq: Every day | ORAL | 3 refills | Status: DC

## 2016-04-17 NOTE — Assessment & Plan Note (Signed)
stable overall by history and exam, recent data reviewed with pt, and pt to continue medical treatment as before,  to f/u any worsening symptoms or concerns Lab Results  Component Value Date   LDLCALC 94 04/17/2016    

## 2016-04-17 NOTE — Assessment & Plan Note (Signed)
stable overall by history and exam, recent data reviewed with pt, and pt to continue medical treatment as before,  to f/u any worsening symptoms or concerns Lab Results  Component Value Date   HGBA1C 7.3 (H) 04/17/2016

## 2016-04-17 NOTE — Progress Notes (Signed)
Pre visit review using our clinic review tool, if applicable. No additional management support is needed unless otherwise documented below in the visit note. 

## 2016-04-17 NOTE — Progress Notes (Signed)
Subjective:    Patient ID: Bridget Grant, female    DOB: 10-12-1945, 70 y.o.   MRN: [id number]  HPI  Here for yearly f/u;  Overall doing ok;  Pt denies Chest pain, worsening SOB, DOE, wheezing, orthopnea, PND, worsening LE edema, palpitations, dizziness or syncope.  Pt denies neurological change such as new headache, facial or extremity weakness.  Pt denies polydipsia, polyuria, or low sugar symptoms. Pt states overall good compliance with treatment and medications, good tolerability, and has been trying to follow appropriate diet.  Pt denies worsening depressive symptoms, suicidal ideation or panic. No fever, night sweats, wt loss, loss of appetite, or other constitutional symptoms.  Pt states good ability with ADL's, has low fall risk, home safety reviewed and adequate, no other significant changes in hearing or vision, and only occasionally active with exercise, very rare actually due to having to care for husband now paraplegic after spinal tumor, the left hip fracture. Marland Kitchen  Has had more stress recently, husband broke hip, and thinks diet worse, may have gained some wt Wt Readings from Last 3 Encounters:  04/17/16 211 lb (95.7 kg)  10/12/15 210 lb (95.3 kg)  04/14/15 210 lb (95.3 kg)    Past Medical History:  Diagnosis Date  . Anxiety   . Depression   . Hyperlipidemia   . Hypertension   . Migraine   . Osteopenia   . Type II or unspecified type diabetes mellitus without mention of complication, uncontrolled 04/09/2013   Past Surgical History:  Procedure Laterality Date  . ABDOMINAL HYSTERECTOMY      reports that she has never smoked. She has never used smokeless tobacco. She reports that she does not drink alcohol or use drugs. family history includes Cancer in her mother; Hypertension in her father. No Known Allergies Current Outpatient Prescriptions on File Prior to Visit  Medication Sig Dispense Refill  . aspirin EC 81 MG tablet Take 81 mg by mouth daily.    Marland Kitchen atorvastatin  (LIPITOR) 20 MG tablet take 1 tablet by mouth once daily 90 tablet 3  . Diclofenac Sodium 2 % SOLN Apply 1 pump twice daily. 112 g 3  . diltiazem (CARDIZEM CD) 240 MG 24 hr capsule Take 1 capsule (240 mg total) by mouth daily. 90 capsule 3  . hydrochlorothiazide (MICROZIDE) 12.5 MG capsule Take 1 capsule (12.5 mg total) by mouth daily. 90 capsule 3  . lisinopril (PRINIVIL,ZESTRIL) 20 MG tablet Take 1 tablet (20 mg total) by mouth daily. 90 tablet 3  . metFORMIN (GLUCOPHAGE XR) 500 MG 24 hr tablet Take 1 tablet (500 mg total) by mouth daily with breakfast. 90 tablet 3  . pravastatin (PRAVACHOL) 80 MG tablet Take 1 tablet (80 mg total) by mouth daily. 90 tablet 3  . [DISCONTINUED] diltiazem (TIAZAC) 240 MG 24 hr capsule Take 1 capsule (240 mg total) by mouth daily. 90 capsule 3   No current facility-administered medications on file prior to visit.    Review of Systems  Constitutional: Negative for unusual diaphoresis or night sweats HENT: Negative for ear swelling or discharge Eyes: Negative for worsening visual haziness  Respiratory: Negative for choking and stridor.   Gastrointestinal: Negative for distension or worsening eructation Genitourinary: Negative for retention or change in urine volume.  Musculoskeletal: Negative for other MSK pain or swelling Skin: Negative for color change and worsening wound Neurological: Negative for tremors and numbness other than noted  Psychiatric/Behavioral: Negative for decreased concentration or agitation other than above   All  other system neg per pt    Objective:   Physical Exam BP 128/72   Pulse 73   Temp 98 F (36.7 C) (Oral)   Resp 20   Wt 211 lb (95.7 kg)   SpO2 97%   BMI 38.59 kg/m  VS noted,  Constitutional: Pt appears in no apparent distress HENT: Head: NCAT.  Right Ear: External ear normal.  Left Ear: External ear normal.  Eyes: . Pupils are equal, round, and reactive to light. Conjunctivae and EOM are normal Neck: Normal range  of motion. Neck supple.  Cardiovascular: Normal rate and regular rhythm.   Pulmonary/Chest: Effort normal and breath sounds without rales or wheezing.  Abd:  Soft, NT, ND, + BS Neurological: Pt is alert. Not confused , motor grossly intact Skin: Skin is warm. No rash, chronic trace to 1+ left > right LE edema Psychiatric: Pt behavior is normal. No agitation.  No other new exam findings    Assessment & Plan:

## 2016-04-17 NOTE — Assessment & Plan Note (Signed)
stable overall by history and exam, recent data reviewed with pt, and pt to continue medical treatment as before,  to f/u any worsening symptoms or concerns BP Readings from Last 3 Encounters:  04/17/16 128/72  10/12/15 128/80  04/14/15 130/84

## 2016-04-17 NOTE — Patient Instructions (Addendum)

## 2016-05-16 ENCOUNTER — Other Ambulatory Visit: Payer: Self-pay | Admitting: Internal Medicine

## 2016-10-16 ENCOUNTER — Encounter: Payer: Self-pay | Admitting: Internal Medicine

## 2016-10-16 ENCOUNTER — Other Ambulatory Visit (INDEPENDENT_AMBULATORY_CARE_PROVIDER_SITE_OTHER): Payer: Medicare Other

## 2016-10-16 ENCOUNTER — Other Ambulatory Visit: Payer: Self-pay | Admitting: Internal Medicine

## 2016-10-16 ENCOUNTER — Ambulatory Visit (INDEPENDENT_AMBULATORY_CARE_PROVIDER_SITE_OTHER): Payer: Medicare Other | Admitting: Internal Medicine

## 2016-10-16 VITALS — BP 142/82 | HR 80 | Ht 62.0 in | Wt 217.0 lb

## 2016-10-16 DIAGNOSIS — Z0000 Encounter for general adult medical examination without abnormal findings: Secondary | ICD-10-CM

## 2016-10-16 DIAGNOSIS — E119 Type 2 diabetes mellitus without complications: Secondary | ICD-10-CM

## 2016-10-16 DIAGNOSIS — I10 Essential (primary) hypertension: Secondary | ICD-10-CM

## 2016-10-16 DIAGNOSIS — R7989 Other specified abnormal findings of blood chemistry: Secondary | ICD-10-CM

## 2016-10-16 DIAGNOSIS — E785 Hyperlipidemia, unspecified: Secondary | ICD-10-CM

## 2016-10-16 LAB — MICROALBUMIN / CREATININE URINE RATIO
Creatinine,U: 149.5 mg/dL
MICROALB UR: 1.7 mg/dL (ref 0.0–1.9)
Microalb Creat Ratio: 1.1 mg/g (ref 0.0–30.0)

## 2016-10-16 LAB — URINALYSIS, ROUTINE W REFLEX MICROSCOPIC
BILIRUBIN URINE: NEGATIVE
Hgb urine dipstick: NEGATIVE
LEUKOCYTES UA: NEGATIVE
Nitrite: NEGATIVE
Specific Gravity, Urine: >=1.03 — AB (ref 1.000–1.030)
TOTAL PROTEIN, URINE-UPE24: NEGATIVE
Urine Glucose: NEGATIVE
Urobilinogen, UA: 0.2 (ref 0.0–1.0)
pH: 5 (ref 5.0–8.0)

## 2016-10-16 LAB — CBC WITH DIFFERENTIAL/PLATELET
BASOS ABS: 0.1 10*3/uL (ref 0.0–0.1)
BASOS PCT: 0.6 % (ref 0.0–3.0)
EOS PCT: 3.3 % (ref 0.0–5.0)
Eosinophils Absolute: 0.3 10*3/uL (ref 0.0–0.7)
HCT: 45.4 % (ref 36.0–46.0)
Hemoglobin: 15.2 g/dL — ABNORMAL HIGH (ref 12.0–15.0)
LYMPHS PCT: 29.1 % (ref 12.0–46.0)
Lymphs Abs: 2.7 10*3/uL (ref 0.7–4.0)
MCHC: 33.5 g/dL (ref 30.0–36.0)
MCV: 95.5 fl (ref 78.0–100.0)
MONOS PCT: 12.2 % — AB (ref 3.0–12.0)
Monocytes Absolute: 1.1 10*3/uL — ABNORMAL HIGH (ref 0.1–1.0)
NEUTROS ABS: 5 10*3/uL (ref 1.4–7.7)
Neutrophils Relative %: 54.8 % (ref 43.0–77.0)
PLATELETS: 228 10*3/uL (ref 150.0–400.0)
RBC: 4.75 Mil/uL (ref 3.87–5.11)
RDW: 13.3 % (ref 11.5–15.5)
WBC: 9.2 10*3/uL (ref 4.0–10.5)

## 2016-10-16 LAB — HEPATIC FUNCTION PANEL
ALK PHOS: 66 U/L (ref 39–117)
ALT: 22 U/L (ref 0–35)
AST: 20 U/L (ref 0–37)
Albumin: 4.5 g/dL (ref 3.5–5.2)
BILIRUBIN TOTAL: 0.4 mg/dL (ref 0.2–1.2)
Bilirubin, Direct: 0 mg/dL (ref 0.0–0.3)
Total Protein: 7.7 g/dL (ref 6.0–8.3)

## 2016-10-16 LAB — BASIC METABOLIC PANEL
BUN: 20 mg/dL (ref 6–23)
CO2: 29 mEq/L (ref 19–32)
Calcium: 10.1 mg/dL (ref 8.4–10.5)
Chloride: 100 mEq/L (ref 96–112)
Creatinine, Ser: 0.72 mg/dL (ref 0.40–1.20)
GFR: 85.26 mL/min (ref >60.00)
Glucose, Bld: 137 mg/dL — ABNORMAL HIGH (ref 70–99)
POTASSIUM: 4.5 meq/L (ref 3.5–5.1)
SODIUM: 136 meq/L (ref 135–145)

## 2016-10-16 LAB — LIPID PANEL
Cholesterol: 158 mg/dL (ref 0–200)
HDL: 44.3 mg/dL (ref >39.00)
NONHDL: 113.93
Total CHOL/HDL Ratio: 4
Triglycerides: 232 mg/dL — ABNORMAL HIGH (ref 0.0–149.0)
VLDL: 46.4 mg/dL — ABNORMAL HIGH (ref 0.0–40.0)

## 2016-10-16 LAB — HEMOGLOBIN A1C

## 2016-10-16 LAB — TSH: TSH: 2.09 u[IU]/mL (ref 0.35–4.50)

## 2016-10-16 LAB — LDL CHOLESTEROL, DIRECT: LDL DIRECT: 85 mg/dL

## 2016-10-16 MED ORDER — GLIPIZIDE ER 5 MG PO TB24: 5.0000 mg | ORAL_TABLET | Freq: Every day | ORAL | 3 refills | Status: DC

## 2016-10-16 NOTE — Patient Instructions (Addendum)
Please check your Blood Pressure on a regular basis, with the goal being less than 140/90  Please see your eye doctor every year  Please schedule the bone density test before leaving today at the scheduling desk (where you check out)  Please continue all other medications as before, and refills have been done if requested.  Please have the pharmacy call with any other refills you may need.  Please continue your efforts at being more active, low cholesterol diet, and weight control.  You are otherwise up to date with prevention measures today.  Please keep your appointments with your specialists as you may have planned  Please go to the LAB in the Basement (turn left off the elevator) for the tests to be done today  You will be contacted by phone if any changes need to be made immediately.  Otherwise, you will receive a letter about your results with an explanation, but please check with MyChart first.  Please remember to sign up for MyChart if you have not done so, as this will be important to you in the future with finding out test results, communicating by private email, and scheduling acute appointments online when needed.  If you have Medicare related insurance (such as traditional Medicare, [employer] or [employer], or similar), Please make an appointment at the [facility] with Sharee Pimple, the [employer], for your Wellness Visit in this office, which is a benefit with your insurance.  If you have Medicare related insurance (such as traditional Medicare, [employer] or [employer], or similar), Please make an appointment at the [facility] with Sharee Pimple, the [employer], for your Wellness Visit in this office, which is a benefit with your insurance.  Please return in 6 months, or sooner if needed, with Lab testing done 3-5 days before

## 2016-10-16 NOTE — Progress Notes (Signed)
Subjective:    Patient ID: Bridget Grant, female    DOB: [birth year], 71 y.o.   MRN: [id number]  HPI  Here for wellness and f/u;  Overall doing ok;  Pt denies Chest pain, worsening SOB, DOE, wheezing, orthopnea, PND, worsening LE edema, palpitations, dizziness or syncope.  Pt denies neurological change such as new headache, facial or extremity weakness.  Pt denies polydipsia, polyuria, or low sugar symptoms. Pt states overall good compliance with treatment and medications, good tolerability, and has been trying to follow appropriate diet.  Pt denies worsening depressive symptoms, suicidal ideation or panic. No fever, night sweats, wt loss, loss of appetite, or other constitutional symptoms.  Pt states good ability with ADL's, has low fall risk, home safety reviewed and adequate, no other significant changes in hearing or vision, and only occasionally active with exercise. No other hx changes BP Readings from Last 3 Encounters:  10/16/16 (!) 142/82  04/17/16 128/72  10/12/15 128/80   Wt Readings from Last 3 Encounters:  10/16/16 217 lb (98.4 kg)  04/17/16 211 lb (95.7 kg)  10/12/15 210 lb (95.3 kg)   Past Medical History:  Diagnosis Date  . Anxiety   . Depression   . Hyperlipidemia   . Hypertension   . Migraine   . Osteopenia   . Type II or unspecified type diabetes mellitus without mention of complication, uncontrolled 04/09/2013   Past Surgical History:  Procedure Laterality Date  . ABDOMINAL HYSTERECTOMY      reports that she has never smoked. She has never used smokeless tobacco. She reports that she does not drink alcohol or use drugs. family history includes Cancer in her mother; Hypertension in her father. No Known Allergies Current Outpatient Prescriptions on File Prior to Visit  Medication Sig Dispense Refill  . aspirin EC 81 MG tablet Take 81 mg by mouth daily.    Marland Kitchen atorvastatin (LIPITOR) 20 MG tablet take 1 tablet by mouth once daily 90 tablet 3  . CARTIA XT 240 MG 24  hr capsule take 1 capsule by mouth once daily 90 capsule 3  . Diclofenac Sodium 2 % SOLN Apply 1 pump twice daily. 112 g 3  . hydrochlorothiazide (MICROZIDE) 12.5 MG capsule Take 1 capsule (12.5 mg total) by mouth daily. 90 capsule 3  . lisinopril (PRINIVIL,ZESTRIL) 20 MG tablet take 1 tablet by mouth once daily 90 tablet 3  . metFORMIN (GLUCOPHAGE XR) 500 MG 24 hr tablet Take 2 tablets (1,000 mg total) by mouth daily with breakfast. 180 tablet 3  . pravastatin (PRAVACHOL) 80 MG tablet Take 1 tablet (80 mg total) by mouth daily. 90 tablet 3  . [DISCONTINUED] diltiazem (TIAZAC) 240 MG 24 hr capsule Take 1 capsule (240 mg total) by mouth daily. 90 capsule 3   No current facility-administered medications on file prior to visit.    Review of Systems Constitutional: Negative for other unusual diaphoresis, sweats, appetite or weight changes HENT: Negative for other worsening hearing loss, ear pain, facial swelling, mouth sores or neck stiffness.   Eyes: Negative for other worsening pain, redness or other visual disturbance.  Respiratory: Negative for other stridor or swelling Cardiovascular: Negative for other palpitations or other chest pain  Gastrointestinal: Negative for worsening diarrhea or loose stools, blood in stool, distention or other pain Genitourinary: Negative for hematuria, flank pain or other change in urine volume.  Musculoskeletal: Negative for myalgias or other joint swelling.  Skin: Negative for other color change, or other wound or worsening drainage.  Neurological: Negative for other syncope or numbness. Hematological: Negative for other adenopathy or swelling Psychiatric/Behavioral: Negative for hallucinations, other worsening agitation, SI, self-injury, or new decreased concentration All other system neg per pt    Objective:   Physical Exam BP (!) 142/82   Pulse 80   Ht 5\' 2"  (1.575 m)   Wt 217 lb (98.4 kg)   SpO2 98%   BMI 39.69 kg/m  VS noted,  Constitutional: Pt  is oriented to person, place, and time. Appears well-developed and well-nourished, in no significant distress and comfortable Head: Normocephalic and atraumatic  Eyes: Conjunctivae and EOM are normal. Pupils are equal, round, and reactive to light Right Ear: External ear normal without discharge Left Ear: External ear normal without discharge Nose: Nose without discharge or deformity Mouth/Throat: Oropharynx is without other ulcerations and moist  Neck: Normal range of motion. Neck supple. No JVD present. No tracheal deviation present or significant neck LA or mass Cardiovascular: Normal rate, regular rhythm, normal heart sounds and intact distal pulses.   Pulmonary/Chest: WOB normal and breath sounds without rales or wheezing  Abdominal: Soft. Bowel sounds are normal. NT. No HSM  Musculoskeletal: Normal range of motion. Exhibits chronic bilat trace edema Lymphadenopathy: Has no other cervical adenopathy.  Neurological: Pt is alert and oriented to person, place, and time. Pt has normal reflexes. No cranial nerve deficit. Motor grossly intact, Gait intact Skin: Skin is warm and dry. No rash noted or new ulcerations Psychiatric:  Has normal mood and affect. Behavior is normal without agitation No other exam findings    Assessment & Plan:

## 2016-10-17 ENCOUNTER — Telehealth: Payer: Self-pay

## 2016-10-17 ENCOUNTER — Telehealth: Payer: Self-pay | Admitting: Emergency Medicine

## 2016-10-17 DIAGNOSIS — E2839 Other primary ovarian failure: Secondary | ICD-10-CM

## 2016-10-17 NOTE — Telephone Encounter (Signed)
Ok this is done, thanks 

## 2016-10-17 NOTE — Telephone Encounter (Signed)
Pt has been informed and expressed understanding.  

## 2016-10-17 NOTE — Telephone Encounter (Signed)
Pt is scheduled for Bone Density on 5/16. Please enter orders.

## 2016-10-17 NOTE — Telephone Encounter (Signed)
-----   Message from [name], MD sent at 10/16/2016  8:53 PM EDT ----- Letter sent, cont same tx except  The test results show that your current treatment is OK, except the A1c is quite elevated.  I believe you have been taking all of your metformin, we it seems necessary to add another medication now called glipizide ER 5 mg per day.  You should also hear from the office and a prescription will be sent.  Janit Cutter to please inform pt, I will do rx

## 2016-10-20 NOTE — Assessment & Plan Note (Signed)
stable overall by history and exam, recent data reviewed with pt, and pt to continue medical treatment as before,  to f/u any worsening symptoms or concerns BP Readings from Last 3 Encounters:  10/16/16 (!) 142/82  04/17/16 128/72  10/12/15 128/80

## 2016-10-20 NOTE — Assessment & Plan Note (Signed)

## 2016-10-20 NOTE — Assessment & Plan Note (Signed)
stable overall by history and exam, recent data reviewed with pt, and pt to continue medical treatment as before,  to f/u any worsening symptoms or concerns Lab Results  Component Value Date   HGBA1C 9.2 Repeated and verified X2. (H) 10/16/2016

## 2016-10-20 NOTE — Assessment & Plan Note (Signed)
stable overall by history and exam, recent data reviewed with pt, and pt to continue medical treatment as before,  to f/u any worsening symptoms or concerns Lab Results  Component Value Date   LDLCALC 94 04/17/2016

## 2016-10-23 ENCOUNTER — Ambulatory Visit (INDEPENDENT_AMBULATORY_CARE_PROVIDER_SITE_OTHER)
Admission: RE | Admit: 2016-10-23 | Discharge: 2016-10-23 | Disposition: A | Discharge status: Home / Self Care | Payer: Medicare Other | Source: Ambulatory Visit | Attending: Internal Medicine | Admitting: Internal Medicine

## 2016-10-23 DIAGNOSIS — E2839 Other primary ovarian failure: Secondary | ICD-10-CM

## 2016-10-24 DIAGNOSIS — E2839 Other primary ovarian failure: Secondary | ICD-10-CM | POA: Diagnosis not present

## 2016-10-29 ENCOUNTER — Encounter: Payer: Self-pay | Admitting: Internal Medicine

## 2016-12-02 DIAGNOSIS — L821 Other seborrheic keratosis: Secondary | ICD-10-CM | POA: Diagnosis not present

## 2016-12-02 DIAGNOSIS — D2262 Melanocytic nevi of left upper limb, including shoulder: Secondary | ICD-10-CM | POA: Diagnosis not present

## 2016-12-02 DIAGNOSIS — D224 Melanocytic nevi of scalp and neck: Secondary | ICD-10-CM | POA: Diagnosis not present

## 2016-12-02 DIAGNOSIS — D225 Melanocytic nevi of trunk: Secondary | ICD-10-CM | POA: Diagnosis not present

## 2016-12-02 DIAGNOSIS — D2271 Melanocytic nevi of right lower limb, including hip: Secondary | ICD-10-CM | POA: Diagnosis not present

## 2016-12-02 DIAGNOSIS — L918 Other hypertrophic disorders of the skin: Secondary | ICD-10-CM | POA: Diagnosis not present

## 2016-12-02 DIAGNOSIS — D1801 Hemangioma of skin and subcutaneous tissue: Secondary | ICD-10-CM | POA: Diagnosis not present

## 2017-04-23 ENCOUNTER — Other Ambulatory Visit (INDEPENDENT_AMBULATORY_CARE_PROVIDER_SITE_OTHER): Payer: Medicare Other

## 2017-04-23 ENCOUNTER — Ambulatory Visit (INDEPENDENT_AMBULATORY_CARE_PROVIDER_SITE_OTHER): Payer: Medicare Other | Admitting: Internal Medicine

## 2017-04-23 ENCOUNTER — Encounter: Payer: Self-pay | Admitting: Internal Medicine

## 2017-04-23 VITALS — BP 128/82 | HR 73 | Temp 98.2°F | Ht 62.0 in | Wt 213.0 lb

## 2017-04-23 DIAGNOSIS — Z23 Encounter for immunization: Secondary | ICD-10-CM

## 2017-04-23 DIAGNOSIS — Z0000 Encounter for general adult medical examination without abnormal findings: Secondary | ICD-10-CM

## 2017-04-23 DIAGNOSIS — E785 Hyperlipidemia, unspecified: Secondary | ICD-10-CM

## 2017-04-23 DIAGNOSIS — E119 Type 2 diabetes mellitus without complications: Secondary | ICD-10-CM

## 2017-04-23 DIAGNOSIS — I10 Essential (primary) hypertension: Secondary | ICD-10-CM | POA: Diagnosis not present

## 2017-04-23 LAB — LIPID PANEL
CHOLESTEROL: 152 mg/dL (ref 0–200)
HDL: 47.9 mg/dL (ref >39.00)
LDL Cholesterol: 70 mg/dL (ref 0–99)
NonHDL: 103.93
TRIGLYCERIDES: 170 mg/dL — AB (ref 0.0–149.0)
Total CHOL/HDL Ratio: 3
VLDL: 34 mg/dL (ref 0.0–40.0)

## 2017-04-23 LAB — HEPATIC FUNCTION PANEL
ALBUMIN: 4.4 g/dL (ref 3.5–5.2)
ALT: 16 U/L (ref 0–35)
AST: 18 U/L (ref 0–37)
Alkaline Phosphatase: 55 U/L (ref 39–117)
Bilirubin, Direct: 0.1 mg/dL (ref 0.0–0.3)
Total Bilirubin: 0.5 mg/dL (ref 0.2–1.2)
Total Protein: 7.8 g/dL (ref 6.0–8.3)

## 2017-04-23 LAB — BASIC METABOLIC PANEL
BUN: 13 mg/dL (ref 6–23)
CALCIUM: 10.1 mg/dL (ref 8.4–10.5)
CO2: 30 mEq/L (ref 19–32)
Chloride: 99 mEq/L (ref 96–112)
Creatinine, Ser: 0.69 mg/dL (ref 0.40–1.20)
GFR: 89.41 mL/min (ref >60.00)
Glucose, Bld: 100 mg/dL — ABNORMAL HIGH (ref 70–99)
Potassium: 3.8 mEq/L (ref 3.5–5.1)
SODIUM: 137 meq/L (ref 135–145)

## 2017-04-23 LAB — HEMOGLOBIN A1C: HEMOGLOBIN A1C: 7.2 % — AB (ref 4.6–6.5)

## 2017-04-23 NOTE — Patient Instructions (Addendum)

## 2017-04-23 NOTE — Progress Notes (Signed)
Subjective:    Patient ID: Bridget Grant, female    DOB: May 14, 1946, 71 y.o.   MRN: [id number]  HPI  Here to f/u; overall doing ok,  Pt denies chest pain, increasing sob or doe, wheezing, orthopnea, PND, increased LE swelling, palpitations, dizziness or syncope.  Pt denies new neurological symptoms such as new headache, or facial or extremity weakness or numbness.  Pt denies polydipsia, polyuria, or low sugar episode.  Pt states overall good compliance with meds, mostly trying to follow appropriate diet, with wt overall stable,  but little exercise however.  Admits to some dietary non compliance, but has been able to lose 4 lbs. Wt Readings from Last 3 Encounters:  04/23/17 213 lb (96.6 kg)  10/16/16 217 lb (98.4 kg)  04/17/16 211 lb (95.7 kg)   Past Medical History:  Diagnosis Date  . Anxiety   . Depression   . Hyperlipidemia   . Hypertension   . Migraine   . Osteopenia   . Type II or unspecified type diabetes mellitus without mention of complication, uncontrolled 04/09/2013   Past Surgical History:  Procedure Laterality Date  . ABDOMINAL HYSTERECTOMY      reports that  has never smoked. she has never used smokeless tobacco. She reports that she does not drink alcohol or use drugs. family history includes Cancer in her mother; Hypertension in her father. No Known Allergies Current Outpatient Medications on File Prior to Visit  Medication Sig Dispense Refill  . aspirin EC 81 MG tablet Take 81 mg by mouth daily.    Marland Kitchen atorvastatin (LIPITOR) 20 MG tablet take 1 tablet by mouth once daily 90 tablet 3  . CARTIA XT 240 MG 24 hr capsule take 1 capsule by mouth once daily 90 capsule 3  . Diclofenac Sodium 2 % SOLN Apply 1 pump twice daily. 112 g 3  . glipiZIDE (GLUCOTROL XL) 5 MG 24 hr tablet Take 1 tablet (5 mg total) by mouth daily with breakfast. 90 tablet 3  . hydrochlorothiazide (MICROZIDE) 12.5 MG capsule Take 1 capsule (12.5 mg total) by mouth daily. 90 capsule 3  . lisinopril  (PRINIVIL,ZESTRIL) 20 MG tablet take 1 tablet by mouth once daily 90 tablet 3  . metFORMIN (GLUCOPHAGE XR) 500 MG 24 hr tablet Take 2 tablets (1,000 mg total) by mouth daily with breakfast. 180 tablet 3  . [DISCONTINUED] diltiazem (TIAZAC) 240 MG 24 hr capsule Take 1 capsule (240 mg total) by mouth daily. 90 capsule 3   No current facility-administered medications on file prior to visit.    Review of Systems  Constitutional: Negative for other unusual diaphoresis or sweats HENT: Negative for ear discharge or swelling Eyes: Negative for other worsening visual disturbances Respiratory: Negative for stridor or other swelling  Gastrointestinal: Negative for worsening distension or other blood Genitourinary: Negative for retention or other urinary change Musculoskeletal: Negative for other MSK pain or swelling Skin: Negative for color change or other new lesions Neurological: Negative for worsening tremors and other numbness  Psychiatric/Behavioral: Negative for worsening agitation or other fatigue All other exam findings    Objective:   Physical Exam BP 128/82   Pulse 73   Temp 98.2 F (36.8 C) (Oral)   Ht 5\' 2"  (1.575 m)   Wt 213 lb (96.6 kg)   SpO2 98%   BMI 38.96 kg/m  VS noted,  Constitutional: Pt appears in NAD HENT: Head: NCAT.  Right Ear: External ear normal.  Left Ear: External ear normal.  Eyes: .  Pupils are equal, round, and reactive to light. Conjunctivae and EOM are normal Nose: without d/c or deformity Neck: Neck supple. Gross normal ROM Cardiovascular: Normal rate and regular rhythm.   Pulmonary/Chest: Effort normal and breath sounds without rales or wheezing.  Abd:  Soft, NT, ND, + BS, no organomegaly Neurological: Pt is alert. At baseline orientation, motor grossly intact Skin: Skin is warm. No rashes, other new lesions, no LE edema Psychiatric: Pt behavior is normal without agitation  No other exam findings    Assessment & Plan:

## 2017-04-26 ENCOUNTER — Encounter: Payer: Self-pay | Admitting: Internal Medicine

## 2017-04-26 NOTE — Assessment & Plan Note (Signed)
Lab Results  Component Value Date   LDLCALC 70 04/23/2017  stable overall by history and exam, recent data reviewed with pt, and pt to continue medical treatment as before,  to f/u any worsening symptoms or concerns

## 2017-04-26 NOTE — Assessment & Plan Note (Signed)
BP Readings from Last 3 Encounters:  04/23/17 128/82  10/16/16 (!) 142/82  04/17/16 128/72  stable overall by history and exam, recent data reviewed with pt, and pt to continue medical treatment as before,  to f/u any worsening symptoms or concerns

## 2017-04-26 NOTE — Assessment & Plan Note (Signed)
Lab Results  Component Value Date   HGBA1C 7.2 (H) 04/23/2017  stable overall by history and exam, recent data reviewed with pt, and pt to continue medical treatment as before,  to f/u any worsening symptoms or concerns

## 2017-05-06 ENCOUNTER — Other Ambulatory Visit: Payer: Self-pay | Admitting: Internal Medicine

## 2017-06-27 ENCOUNTER — Other Ambulatory Visit: Payer: Self-pay | Admitting: Internal Medicine

## 2017-10-02 ENCOUNTER — Other Ambulatory Visit: Payer: Self-pay

## 2017-10-02 MED ORDER — GLIPIZIDE ER 5 MG PO TB24: 5.0000 mg | ORAL_TABLET | Freq: Every day | ORAL | 1 refills | Status: DC

## 2017-10-17 DIAGNOSIS — I8312 Varicose veins of left lower extremity with inflammation: Secondary | ICD-10-CM | POA: Diagnosis not present

## 2017-10-17 DIAGNOSIS — D224 Melanocytic nevi of scalp and neck: Secondary | ICD-10-CM | POA: Diagnosis not present

## 2017-10-17 DIAGNOSIS — I8311 Varicose veins of right lower extremity with inflammation: Secondary | ICD-10-CM | POA: Diagnosis not present

## 2017-10-17 DIAGNOSIS — D1801 Hemangioma of skin and subcutaneous tissue: Secondary | ICD-10-CM | POA: Diagnosis not present

## 2017-10-17 DIAGNOSIS — L918 Other hypertrophic disorders of the skin: Secondary | ICD-10-CM | POA: Diagnosis not present

## 2017-10-17 DIAGNOSIS — I872 Venous insufficiency (chronic) (peripheral): Secondary | ICD-10-CM | POA: Diagnosis not present

## 2017-10-17 DIAGNOSIS — D225 Melanocytic nevi of trunk: Secondary | ICD-10-CM | POA: Diagnosis not present

## 2017-10-17 DIAGNOSIS — L821 Other seborrheic keratosis: Secondary | ICD-10-CM | POA: Diagnosis not present

## 2017-10-17 DIAGNOSIS — D2271 Melanocytic nevi of right lower limb, including hip: Secondary | ICD-10-CM | POA: Diagnosis not present

## 2017-10-21 ENCOUNTER — Other Ambulatory Visit (INDEPENDENT_AMBULATORY_CARE_PROVIDER_SITE_OTHER): Payer: Medicare Other

## 2017-10-21 ENCOUNTER — Ambulatory Visit (INDEPENDENT_AMBULATORY_CARE_PROVIDER_SITE_OTHER): Payer: Medicare Other | Admitting: Internal Medicine

## 2017-10-21 ENCOUNTER — Encounter: Payer: Self-pay | Admitting: Internal Medicine

## 2017-10-21 VITALS — BP 132/88 | HR 65 | Temp 98.5°F | Ht 62.0 in | Wt 217.0 lb

## 2017-10-21 DIAGNOSIS — Z0000 Encounter for general adult medical examination without abnormal findings: Secondary | ICD-10-CM

## 2017-10-21 DIAGNOSIS — E119 Type 2 diabetes mellitus without complications: Secondary | ICD-10-CM

## 2017-10-21 LAB — BASIC METABOLIC PANEL
BUN: 18 mg/dL (ref 6–23)
CALCIUM: 9.6 mg/dL (ref 8.4–10.5)
CO2: 28 mEq/L (ref 19–32)
Chloride: 103 mEq/L (ref 96–112)
Creatinine, Ser: 0.82 mg/dL (ref 0.40–1.20)
GFR: 73.16 mL/min (ref >60.00)
Glucose, Bld: 105 mg/dL — ABNORMAL HIGH (ref 70–99)
POTASSIUM: 4.6 meq/L (ref 3.5–5.1)
SODIUM: 140 meq/L (ref 135–145)

## 2017-10-21 LAB — CBC WITH DIFFERENTIAL/PLATELET
BASOS ABS: 0 10*3/uL (ref 0.0–0.1)
Basophils Relative: 0.3 % (ref 0.0–3.0)
Eosinophils Absolute: 0.2 10*3/uL (ref 0.0–0.7)
Eosinophils Relative: 2.5 % (ref 0.0–5.0)
HCT: 42.5 % (ref 36.0–46.0)
HEMOGLOBIN: 14.5 g/dL (ref 12.0–15.0)
LYMPHS PCT: 29.9 % (ref 12.0–46.0)
Lymphs Abs: 2.8 10*3/uL (ref 0.7–4.0)
MCHC: 34.2 g/dL (ref 30.0–36.0)
MCV: 96.2 fl (ref 78.0–100.0)
MONO ABS: 0.9 10*3/uL (ref 0.1–1.0)
Monocytes Relative: 10 % (ref 3.0–12.0)
Neutro Abs: 5.4 10*3/uL (ref 1.4–7.7)
Neutrophils Relative %: 57.3 % (ref 43.0–77.0)
Platelets: 238 10*3/uL (ref 150.0–400.0)
RBC: 4.42 Mil/uL (ref 3.87–5.11)
RDW: 13 % (ref 11.5–15.5)
WBC: 9.4 10*3/uL (ref 4.0–10.5)

## 2017-10-21 LAB — URINALYSIS, ROUTINE W REFLEX MICROSCOPIC
Bilirubin Urine: NEGATIVE
Hgb urine dipstick: NEGATIVE
KETONES UR: NEGATIVE
Leukocytes, UA: NEGATIVE
NITRITE: NEGATIVE
RBC / HPF: NONE SEEN (ref 0–?)
Specific Gravity, Urine: 1.02 (ref 1.000–1.030)
Total Protein, Urine: NEGATIVE
UROBILINOGEN UA: 0.2 (ref 0.0–1.0)
Urine Glucose: NEGATIVE
pH: 6.5 (ref 5.0–8.0)

## 2017-10-21 LAB — LIPID PANEL
CHOLESTEROL: 142 mg/dL (ref 0–200)
HDL: 43.8 mg/dL (ref >39.00)
LDL Cholesterol: 64 mg/dL (ref 0–99)
NonHDL: 97.83
TRIGLYCERIDES: 171 mg/dL — AB (ref 0.0–149.0)
Total CHOL/HDL Ratio: 3
VLDL: 34.2 mg/dL (ref 0.0–40.0)

## 2017-10-21 LAB — HEPATIC FUNCTION PANEL
ALK PHOS: 51 U/L (ref 39–117)
ALT: 22 U/L (ref 0–35)
AST: 21 U/L (ref 0–37)
Albumin: 4.2 g/dL (ref 3.5–5.2)
BILIRUBIN DIRECT: 0.1 mg/dL (ref 0.0–0.3)
BILIRUBIN TOTAL: 0.4 mg/dL (ref 0.2–1.2)
Total Protein: 7.7 g/dL (ref 6.0–8.3)

## 2017-10-21 LAB — TSH: TSH: 2.22 u[IU]/mL (ref 0.35–4.50)

## 2017-10-21 LAB — HEMOGLOBIN A1C: Hgb A1c MFr Bld: 8.5 % — ABNORMAL HIGH (ref 4.6–6.5)

## 2017-10-21 NOTE — Assessment & Plan Note (Signed)

## 2017-10-21 NOTE — Progress Notes (Signed)
Subjective:    Patient ID: Bridget Grant, female    DOB: [birth year], 72 y.o.   MRN: [id number]  HPI  Here for wellness and f/u;  Overall doing ok;  Pt denies Chest pain, worsening SOB, DOE, wheezing, orthopnea, PND, worsening LE edema, palpitations, dizziness or syncope.  Pt denies neurological change such as new headache, facial or extremity weakness.  Pt denies polydipsia, polyuria, or low sugar symptoms. Pt states overall good compliance with treatment and medications, good tolerability, and has been trying to follow appropriate diet.  Pt denies worsening depressive symptoms, suicidal ideation or panic. No fever, night sweats, wt loss, loss of appetite, or other constitutional symptoms.  Pt states good ability with ADL's, has low fall risk, home safety reviewed and adequate, no other significant changes in hearing or vision, and only occasionally active with exercise. No other new complaints or interval change Past Medical History:  Diagnosis Date  . Anxiety   . Depression   . Hyperlipidemia   . Hypertension   . Migraine   . Osteopenia   . Type II or unspecified type diabetes mellitus without mention of complication, uncontrolled 04/09/2013   Past Surgical History:  Procedure Laterality Date  . ABDOMINAL HYSTERECTOMY      reports that she has never smoked. She has never used smokeless tobacco. She reports that she does not drink alcohol or use drugs. family history includes Cancer in her mother; Hypertension in her father. No Known Allergies Current Outpatient Medications on File Prior to Visit  Medication Sig Dispense Refill  . aspirin EC 81 MG tablet Take 81 mg by mouth daily.    Marland Kitchen atorvastatin (LIPITOR) 20 MG tablet take 1 tablet by mouth once daily 90 tablet 3  . Diclofenac Sodium 2 % SOLN Apply 1 pump twice daily. 112 g 3  . diltiazem (CARDIZEM CD) 240 MG 24 hr capsule take 1 capsule by mouth once daily 90 capsule 3  . glipiZIDE (GLUCOTROL XL) 5 MG 24 hr tablet Take 1 tablet (5  mg total) by mouth daily with breakfast. 90 tablet 1  . hydrochlorothiazide (MICROZIDE) 12.5 MG capsule Take 1 capsule (12.5 mg total) by mouth daily. 90 capsule 3  . lisinopril (PRINIVIL,ZESTRIL) 20 MG tablet take 1 tablet by mouth once daily 90 tablet 3  . metFORMIN (GLUCOPHAGE-XR) 500 MG 24 hr tablet take 2 tablets by mouth once daily WITH BREAKFAST 180 tablet 3  . [DISCONTINUED] diltiazem (TIAZAC) 240 MG 24 hr capsule Take 1 capsule (240 mg total) by mouth daily. 90 capsule 3   No current facility-administered medications on file prior to visit.    Review of Systems Constitutional: Negative for other unusual diaphoresis, sweats, appetite or weight changes HENT: Negative for other worsening hearing loss, ear pain, facial swelling, mouth sores or neck stiffness.   Eyes: Negative for other worsening pain, redness or other visual disturbance.  Respiratory: Negative for other stridor or swelling Cardiovascular: Negative for other palpitations or other chest pain  Gastrointestinal: Negative for worsening diarrhea or loose stools, blood in stool, distention or other pain Genitourinary: Negative for hematuria, flank pain or other change in urine volume.  Musculoskeletal: Negative for myalgias or other joint swelling.  Skin: Negative for other color change, or other wound or worsening drainage.  Neurological: Negative for other syncope or numbness. Hematological: Negative for other adenopathy or swelling Psychiatric/Behavioral: Negative for hallucinations, other worsening agitation, SI, self-injury, or new decreased concentration All other system neg per pt    Objective:  Physical Exam BP 132/88   Pulse 65   Temp 98.5 F (36.9 C) (Oral)   Ht 5\' 2"  (1.575 m)   Wt 217 lb (98.4 kg)   SpO2 95%   BMI 39.69 kg/m  VS noted,  Constitutional: Pt is oriented to person, place, and time. Appears well-developed and well-nourished, in no significant distress and comfortable Head: Normocephalic and  atraumatic  Eyes: Conjunctivae and EOM are normal. Pupils are equal, round, and reactive to light Right Ear: External ear normal without discharge Left Ear: External ear normal without discharge Nose: Nose without discharge or deformity Mouth/Throat: Oropharynx is without other ulcerations and moist  Neck: Normal range of motion. Neck supple. No JVD present. No tracheal deviation present or significant neck LA or mass Cardiovascular: Normal rate, regular rhythm, normal heart sounds and intact distal pulses.   Pulmonary/Chest: WOB normal and breath sounds without rales or wheezing  Abdominal: Soft. Bowel sounds are normal. NT. No HSM  Musculoskeletal: Normal range of motion. Exhibits no edema Lymphadenopathy: Has no other cervical adenopathy.  Neurological: Pt is alert and oriented to person, place, and time. Pt has normal reflexes. No cranial nerve deficit. Motor grossly intact, Gait intact Skin: Skin is warm and dry. No rash noted or new ulcerations Psychiatric:  Has normal mood and affect. Behavior is normal without agitation No other exam findings Lab Results  Component Value Date   WBC 9.2 10/16/2016   HGB 15.2 (H) 10/16/2016   HCT 45.4 10/16/2016   PLT 228.0 10/16/2016   GLUCOSE 100 (H) 04/23/2017   CHOL 152 04/23/2017   TRIG 170.0 (H) 04/23/2017   HDL 47.90 04/23/2017   LDLDIRECT 85.0 10/16/2016   LDLCALC 70 04/23/2017   ALT 16 04/23/2017   AST 18 04/23/2017   NA 137 04/23/2017   K 3.8 04/23/2017   CL 99 04/23/2017   CREATININE 0.69 04/23/2017   BUN 13 04/23/2017   CO2 30 04/23/2017   TSH 2.09 10/16/2016   HGBA1C 7.2 (H) 04/23/2017   MICROALBUR 1.7 10/16/2016       Assessment & Plan:

## 2017-10-21 NOTE — Assessment & Plan Note (Signed)
Lab Results  Component Value Date   HGBA1C 7.2 (H) 04/23/2017   stable overall by history and exam, recent data reviewed with pt, and pt to continue medical treatment as before,  to f/u any worsening symptoms or concerns

## 2017-10-21 NOTE — Patient Instructions (Addendum)
Please remember to make an eye doctor with Dr Katy Fitch, as well as the mammogram  Please continue all other medications as before, and refills have been done if requested.  Please have the pharmacy call with any other refills you may need.  Please continue your efforts at being more active, low cholesterol diet, and weight control.  You are otherwise up to date with prevention measures today.  Please keep your appointments with your specialists as you may have planned  Please go to the LAB in the Basement (turn left off the elevator) for the tests to be done today  You will be contacted by phone if any changes need to be made immediately.  Otherwise, you will receive a letter about your results with an explanation, but please check with MyChart first.  Please remember to sign up for MyChart if you have not done so, as this will be important to you in the future with finding out test results, communicating by private email, and scheduling acute appointments online when needed.  Please return in 6 months, or sooner if needed, with Lab testing done 3-5 days before

## 2017-10-22 ENCOUNTER — Encounter: Payer: Self-pay | Admitting: Internal Medicine

## 2017-10-22 ENCOUNTER — Other Ambulatory Visit: Payer: Self-pay | Admitting: Internal Medicine

## 2017-10-22 LAB — MICROALBUMIN / CREATININE URINE RATIO
CREATININE, U: 93.5 mg/dL
MICROALB/CREAT RATIO: 0.7 mg/g (ref 0.0–30.0)
Microalb, Ur: <0.7 mg/dL (ref 0.0–1.9)

## 2017-10-22 MED ORDER — METFORMIN HCL ER 500 MG PO TB24: 1500.0000 mg | ORAL_TABLET | Freq: Every day | ORAL | 3 refills | Status: DC

## 2017-10-23 ENCOUNTER — Telehealth: Payer: Self-pay

## 2017-10-23 NOTE — Telephone Encounter (Signed)
Pt has been informed and expressed understanding.  

## 2017-10-23 NOTE — Telephone Encounter (Signed)
-----   Message from [name], MD sent at 10/22/2017  5:29 PM EDT ----- Letter sent, cont same tx except  The test results show that your current treatment is OK, except the A1c is too high. We should increase the metformin ER to 3 pills in the AM.  We will send a new prescription to your pharmacy, and you should hear from the office as well.  Shirron to please inform pt, I will do rx

## 2017-10-31 ENCOUNTER — Other Ambulatory Visit: Payer: Self-pay | Admitting: Internal Medicine

## 2017-10-31 MED ORDER — ZOSTER VAC RECOMB ADJUVANTED 50 MCG/0.5ML IM SUSR: 0.5000 mL | Freq: Once | INTRAMUSCULAR | 1 refills | Status: AC

## 2018-03-28 ENCOUNTER — Other Ambulatory Visit: Payer: Self-pay | Admitting: Internal Medicine

## 2018-04-27 ENCOUNTER — Encounter: Payer: Self-pay | Admitting: Internal Medicine

## 2018-04-27 ENCOUNTER — Other Ambulatory Visit: Payer: Self-pay | Admitting: Internal Medicine

## 2018-04-27 ENCOUNTER — Other Ambulatory Visit (INDEPENDENT_AMBULATORY_CARE_PROVIDER_SITE_OTHER): Payer: Medicare Other

## 2018-04-27 ENCOUNTER — Ambulatory Visit (INDEPENDENT_AMBULATORY_CARE_PROVIDER_SITE_OTHER): Payer: Medicare Other | Admitting: Internal Medicine

## 2018-04-27 VITALS — BP 116/68 | HR 70 | Temp 98.5°F | Ht 62.0 in | Wt 207.0 lb

## 2018-04-27 DIAGNOSIS — F32A Depression, unspecified: Secondary | ICD-10-CM

## 2018-04-27 DIAGNOSIS — E785 Hyperlipidemia, unspecified: Secondary | ICD-10-CM | POA: Diagnosis not present

## 2018-04-27 DIAGNOSIS — E119 Type 2 diabetes mellitus without complications: Secondary | ICD-10-CM

## 2018-04-27 DIAGNOSIS — I10 Essential (primary) hypertension: Secondary | ICD-10-CM | POA: Diagnosis not present

## 2018-04-27 DIAGNOSIS — F329 Major depressive disorder, single episode, unspecified: Secondary | ICD-10-CM | POA: Diagnosis not present

## 2018-04-27 LAB — HEPATIC FUNCTION PANEL
ALBUMIN: 4.5 g/dL (ref 3.5–5.2)
ALK PHOS: 49 U/L (ref 39–117)
ALT: 29 U/L (ref 0–35)
AST: 26 U/L (ref 0–37)
Bilirubin, Direct: 0.1 mg/dL (ref 0.0–0.3)
TOTAL PROTEIN: 7.7 g/dL (ref 6.0–8.3)
Total Bilirubin: 0.5 mg/dL (ref 0.2–1.2)

## 2018-04-27 LAB — BASIC METABOLIC PANEL
BUN: 15 mg/dL (ref 6–23)
CALCIUM: 10 mg/dL (ref 8.4–10.5)
CO2: 28 meq/L (ref 19–32)
CREATININE: 0.68 mg/dL (ref 0.40–1.20)
Chloride: 102 mEq/L (ref 96–112)
GFR: 90.67 mL/min (ref >60.00)
Glucose, Bld: 91 mg/dL (ref 70–99)
Potassium: 4.4 mEq/L (ref 3.5–5.1)
Sodium: 138 mEq/L (ref 135–145)

## 2018-04-27 LAB — LIPID PANEL
CHOL/HDL RATIO: 3
CHOLESTEROL: 130 mg/dL (ref 0–200)
HDL: 43.6 mg/dL (ref >39.00)
LDL CALC: 63 mg/dL (ref 0–99)
NonHDL: 86.83
TRIGLYCERIDES: 117 mg/dL (ref 0.0–149.0)
VLDL: 23.4 mg/dL (ref 0.0–40.0)

## 2018-04-27 LAB — HEMOGLOBIN A1C: HEMOGLOBIN A1C: 7.4 % — AB (ref 4.6–6.5)

## 2018-04-27 MED ORDER — METFORMIN HCL ER 500 MG PO TB24: 2000.0000 mg | ORAL_TABLET | Freq: Every day | ORAL | 3 refills | Status: DC

## 2018-04-27 NOTE — Assessment & Plan Note (Signed)
stable overall by history and exam, recent data reviewed with pt, and pt to continue medical treatment as before,  to f/u any worsening symptoms or concerns  

## 2018-04-27 NOTE — Progress Notes (Signed)
Subjective:    Patient ID: Bridget Grant, female    DOB: January 21, 1946, 72 y.o.   MRN: [id number]  HPI  Here to f/u; overall doing ok,  Pt denies chest pain, increasing sob or doe, wheezing, orthopnea, PND, increased LE swelling, palpitations, dizziness or syncope.  Pt denies new neurological symptoms such as new headache, or facial or extremity weakness or numbness.  Pt denies polydipsia, polyuria, or low sugar episode.  Pt states overall good compliance with meds, mostly trying to follow appropriate diet, with wt overall stable,  but little exercise however.  Lost 10 lbs with better diet.  Is under quite a bit of stress as husband nearly cut off finger in an accident and she has to pick him up for everything (200 lbs) as he cannot assist using the right hand after finger reattachment.  Pt continues to have recurring LBP without change in severity, bowel or bladder change, fever, wt loss,  worsening LE pain/numbness/weakness, gait change or falls. Wt Readings from Last 3 Encounters:  04/27/18 207 lb (93.9 kg)  10/21/17 217 lb (98.4 kg)  04/23/17 213 lb (96.6 kg)   Past Medical History:  Diagnosis Date  . Anxiety   . Depression   . Hyperlipidemia   . Hypertension   . Migraine   . Osteopenia   . Type II or unspecified type diabetes mellitus without mention of complication, uncontrolled 04/09/2013   Past Surgical History:  Procedure Laterality Date  . ABDOMINAL HYSTERECTOMY      reports that she has never smoked. She has never used smokeless tobacco. She reports that she does not drink alcohol or use drugs. family history includes Cancer in her mother; Hypertension in her father. No Known Allergies Current Outpatient Medications on File Prior to Visit  Medication Sig Dispense Refill  . aspirin EC 81 MG tablet Take 81 mg by mouth daily.    Marland Kitchen atorvastatin (LIPITOR) 20 MG tablet take 1 tablet by mouth once daily 90 tablet 3  . Diclofenac Sodium 2 % SOLN Apply 1 pump twice daily. 112 g 3  .  diltiazem (CARDIZEM CD) 240 MG 24 hr capsule take 1 capsule by mouth once daily 90 capsule 3  . glipiZIDE (GLUCOTROL XL) 5 MG 24 hr tablet TAKE 1 TABLET(5 MG) BY MOUTH DAILY WITH BREAKFAST 90 tablet 0  . hydrochlorothiazide (MICROZIDE) 12.5 MG capsule Take 1 capsule (12.5 mg total) by mouth daily. 90 capsule 3  . lisinopril (PRINIVIL,ZESTRIL) 20 MG tablet take 1 tablet by mouth once daily 90 tablet 3  . [DISCONTINUED] diltiazem (TIAZAC) 240 MG 24 hr capsule Take 1 capsule (240 mg total) by mouth daily. 90 capsule 3   No current facility-administered medications on file prior to visit.    Review of Systems  Constitutional: Negative for other unusual diaphoresis or sweats HENT: Negative for ear discharge or swelling Eyes: Negative for other worsening visual disturbances Respiratory: Negative for stridor or other swelling  Gastrointestinal: Negative for worsening distension or other blood Genitourinary: Negative for retention or other urinary change Musculoskeletal: Negative for other MSK pain or swelling Skin: Negative for color change or other new lesions Neurological: Negative for worsening tremors and other numbness  Psychiatric/Behavioral: Negative for worsening agitation or other fatigue All other system neg per pt    Objective:   Physical Exam BP 116/68   Pulse 70   Temp 98.5 F (36.9 C) (Oral)   Ht 5\' 2"  (1.575 m)   Wt 207 lb (93.9 kg)  SpO2 96%   BMI 37.86 kg/m  \VS noted,  Constitutional: Pt appears in NAD HENT: Head: NCAT.  Right Ear: External ear normal.  Left Ear: External ear normal.  Eyes: . Pupils are equal, round, and reactive to light. Conjunctivae and EOM are normal Nose: without d/c or deformity Neck: Neck supple. Gross normal ROM Cardiovascular: Normal rate and regular rhythm.   Pulmonary/Chest: Effort normal and breath sounds without rales or wheezing.  Abd:  Soft, NT, ND, + BS, no organomegaly Neurological: Pt is alert. At baseline orientation, motor  grossly intact Skin: Skin is warm. No rashes, other new lesions, no LE edema Psychiatric: Pt behavior is normal without agitation, mild depressed affect and mood  No other exam findings  Lab Results  Component Value Date   WBC 9.4 10/21/2017   HGB 14.5 10/21/2017   HCT 42.5 10/21/2017   PLT 238.0 10/21/2017   GLUCOSE 91 04/27/2018   CHOL 130 04/27/2018   TRIG 117.0 04/27/2018   HDL 43.60 04/27/2018   LDLDIRECT 85.0 10/16/2016   LDLCALC 63 04/27/2018   ALT 29 04/27/2018   AST 26 04/27/2018   NA 138 04/27/2018   K 4.4 04/27/2018   CL 102 04/27/2018   CREATININE 0.68 04/27/2018   BUN 15 04/27/2018   CO2 28 04/27/2018   TSH 2.22 10/21/2017   HGBA1C 7.4 (H) 04/27/2018   MICROALBUR <0.7 10/21/2017        Assessment & Plan:

## 2018-04-27 NOTE — Patient Instructions (Addendum)

## 2018-04-28 ENCOUNTER — Telehealth: Payer: Self-pay

## 2018-04-28 NOTE — Telephone Encounter (Signed)
Called pt, LVM.   CRM created.  

## 2018-04-28 NOTE — Telephone Encounter (Signed)
-----   Message from [name], MD sent at 04/27/2018  5:16 PM EST ----- Letter sent, cont same tx except  The test results show that your current treatment is OK, except the A1c is mildly elevated, although it is much improved from before.  We should increase the metformin to 4 pills in the AM.. I will send the prescription, and you should hear from the office as well.Bridget Grant to please inform pt, I will do rx

## 2018-06-27 ENCOUNTER — Other Ambulatory Visit: Payer: Self-pay | Admitting: Internal Medicine

## 2018-07-09 ENCOUNTER — Other Ambulatory Visit: Payer: Self-pay | Admitting: Internal Medicine

## 2018-07-09 MED ORDER — DILTIAZEM HCL ER COATED BEADS 240 MG PO CP24: 240.0000 mg | ORAL_CAPSULE | Freq: Every day | ORAL | 1 refills | Status: DC

## 2018-07-09 NOTE — Telephone Encounter (Signed)
Per visit 04/27/18- patient to continue medications as taking- patient to follow up 10/2018- she does have appointment scheduled. Per protocol- reviewed and refilled per chart notes. Requested Prescriptions  Pending Prescriptions Disp Refills  . diltiazem (CARDIZEM CD) 240 MG 24 hr capsule 90 capsule 3    Sig: Take 1 capsule (240 mg total) by mouth daily.     Off-Protocol Failed - 07/09/2018 11:36 AM      Failed - Medication not assigned to a protocol, review manually.      Passed - Valid encounter within last 12 months    Recent Outpatient Visits          2 months ago Type 2 diabetes mellitus without complication, without long-term current use of insulin ([place])   [facility] [place], James W, MD   8 months ago Preventative health care   [facility] -Georges Mouse, MD   1 year ago Type 2 diabetes mellitus without complication, without long-term current use of insulin [facility])   [place] [facility] -Georges Mouse, MD   1 year ago Preventative health care   [facility] -Georges Mouse, MD   2 years ago Type 2 diabetes mellitus without complication, without long-term current use of insulin [facility])   [facility] -Georges Mouse, MD      Future Appointments            In 3 months Jenny Reichmann, Hunt Oris, MD [place], [facility]

## 2018-07-09 NOTE — Telephone Encounter (Signed)
Copied from [place] [id number]. Topic: Quick Communication - Rx Refill/Question >> Jul 09, 2018 11:28 AM Antonieta Iba C wrote: Medication: diltiazem (CARDIZEM CD) 240 MG 24 hr capsule - pt says that she's been out since last Friday 07/03/18. Pt says that pharmacy stated that they have send over several request, not showing in system]  Has the patient contacted their pharmacy? Yes.   (Agent: If no, request that the patient contact the pharmacy for the refill.) (Agent: If yes, when and what did the pharmacy advise?)  Preferred Pharmacy (with phone number or street name): [facility] [phone] - [place], [address] AT [facility] OF [place] [phone] (Phone) [phone] (Fax)    Agent: Please be advised that RX refills may take up to 3 business days. We ask that you follow-up with your pharmacy.

## 2018-08-21 ENCOUNTER — Other Ambulatory Visit: Payer: Self-pay | Admitting: Internal Medicine

## 2018-08-21 MED ORDER — ATORVASTATIN CALCIUM 20 MG PO TABS: 20.0000 mg | ORAL_TABLET | Freq: Every day | ORAL | 1 refills | Status: DC

## 2018-08-21 NOTE — Telephone Encounter (Signed)
Copied from [place] [id number]. Topic: Quick Communication - Rx Refill/Question >> Aug 21, 2018  4:16 PM Sheran Luz wrote: Medication:  atorvastatin (LIPITOR) 20 MG tablet    Has the patient contacted their pharmacy?Yes, patient was advised to contact office, as they have faxed multiple requests. Pt is out of this medication.   Preferred Pharmacy (with phone number or street name):[facility] [phone] - [place], [address] AT [facility] OF [place]  [phone] (Phone) [phone] (Fax)

## 2018-10-26 ENCOUNTER — Ambulatory Visit: Payer: Medicare Other | Admitting: Internal Medicine

## 2018-10-26 ENCOUNTER — Other Ambulatory Visit: Payer: Self-pay

## 2018-10-26 ENCOUNTER — Other Ambulatory Visit (INDEPENDENT_AMBULATORY_CARE_PROVIDER_SITE_OTHER): Payer: Medicare Other

## 2018-10-26 ENCOUNTER — Ambulatory Visit (INDEPENDENT_AMBULATORY_CARE_PROVIDER_SITE_OTHER): Payer: Medicare Other | Admitting: Internal Medicine

## 2018-10-26 ENCOUNTER — Encounter: Payer: Self-pay | Admitting: Internal Medicine

## 2018-10-26 VITALS — BP 146/82 | HR 70 | Temp 98.5°F | Ht 62.0 in | Wt 209.0 lb

## 2018-10-26 DIAGNOSIS — Z23 Encounter for immunization: Secondary | ICD-10-CM | POA: Diagnosis not present

## 2018-10-26 DIAGNOSIS — E119 Type 2 diabetes mellitus without complications: Secondary | ICD-10-CM

## 2018-10-26 DIAGNOSIS — Z0000 Encounter for general adult medical examination without abnormal findings: Secondary | ICD-10-CM | POA: Diagnosis not present

## 2018-10-26 LAB — CBC WITH DIFFERENTIAL/PLATELET
Basophils Absolute: 0.1 10*3/uL (ref 0.0–0.1)
Basophils Relative: 0.6 % (ref 0.0–3.0)
Eosinophils Absolute: 0.3 10*3/uL (ref 0.0–0.7)
Eosinophils Relative: 3.4 % (ref 0.0–5.0)
HCT: 42.9 % (ref 36.0–46.0)
Hemoglobin: 14.6 g/dL (ref 12.0–15.0)
Lymphocytes Relative: 29.1 % (ref 12.0–46.0)
Lymphs Abs: 2.6 10*3/uL (ref 0.7–4.0)
MCHC: 34.1 g/dL (ref 30.0–36.0)
MCV: 94.9 fl (ref 78.0–100.0)
Monocytes Absolute: 0.9 10*3/uL (ref 0.1–1.0)
Monocytes Relative: 10.7 % (ref 3.0–12.0)
Neutro Abs: 5 10*3/uL (ref 1.4–7.7)
Neutrophils Relative %: 56.2 % (ref 43.0–77.0)
Platelets: 241 10*3/uL (ref 150.0–400.0)
RBC: 4.52 Mil/uL (ref 3.87–5.11)
RDW: 13.3 % (ref 11.5–15.5)
WBC: 8.9 10*3/uL (ref 4.0–10.5)

## 2018-10-26 LAB — HEPATIC FUNCTION PANEL
ALT: 24 U/L (ref 0–35)
AST: 21 U/L (ref 0–37)
Albumin: 4.5 g/dL (ref 3.5–5.2)
Alkaline Phosphatase: 53 U/L (ref 39–117)
Bilirubin, Direct: 0.1 mg/dL (ref 0.0–0.3)
Total Bilirubin: 0.4 mg/dL (ref 0.2–1.2)
Total Protein: 7.7 g/dL (ref 6.0–8.3)

## 2018-10-26 LAB — BASIC METABOLIC PANEL
BUN: 15 mg/dL (ref 6–23)
CO2: 25 mEq/L (ref 19–32)
Calcium: 9.7 mg/dL (ref 8.4–10.5)
Chloride: 101 mEq/L (ref 96–112)
Creatinine, Ser: 0.8 mg/dL (ref 0.40–1.20)
GFR: 70.62 mL/min (ref >60.00)
Glucose, Bld: 102 mg/dL — ABNORMAL HIGH (ref 70–99)
Potassium: 4.2 mEq/L (ref 3.5–5.1)
Sodium: 138 mEq/L (ref 135–145)

## 2018-10-26 LAB — TSH: TSH: 2.57 u[IU]/mL (ref 0.35–4.50)

## 2018-10-26 LAB — URINALYSIS, ROUTINE W REFLEX MICROSCOPIC
Bilirubin Urine: NEGATIVE
Hgb urine dipstick: NEGATIVE
Ketones, ur: NEGATIVE
Leukocytes,Ua: NEGATIVE
Nitrite: NEGATIVE
Specific Gravity, Urine: 1.02 (ref 1.000–1.030)
Total Protein, Urine: NEGATIVE
Urine Glucose: NEGATIVE
Urobilinogen, UA: 0.2 (ref 0.0–1.0)
pH: 5 (ref 5.0–8.0)

## 2018-10-26 LAB — LIPID PANEL
Cholesterol: 135 mg/dL (ref 0–200)
HDL: 42.9 mg/dL (ref >39.00)
LDL Cholesterol: 62 mg/dL (ref 0–99)
NonHDL: 91.83
Total CHOL/HDL Ratio: 3
Triglycerides: 149 mg/dL (ref 0.0–149.0)
VLDL: 29.8 mg/dL (ref 0.0–40.0)

## 2018-10-26 LAB — MICROALBUMIN / CREATININE URINE RATIO
Creatinine,U: 63.3 mg/dL
Microalb Creat Ratio: 1.1 mg/g (ref 0.0–30.0)
Microalb, Ur: <0.7 mg/dL (ref 0.0–1.9)

## 2018-10-26 LAB — HEMOGLOBIN A1C: Hgb A1c MFr Bld: 8.3 % — ABNORMAL HIGH (ref 4.6–6.5)

## 2018-10-26 MED ORDER — LISINOPRIL 20 MG PO TABS: 20.0000 mg | ORAL_TABLET | Freq: Every day | ORAL | 3 refills | Status: DC

## 2018-10-26 MED ORDER — ATORVASTATIN CALCIUM 20 MG PO TABS: 20.0000 mg | ORAL_TABLET | Freq: Every day | ORAL | 3 refills | Status: DC

## 2018-10-26 NOTE — Assessment & Plan Note (Signed)

## 2018-10-26 NOTE — Addendum Note (Signed)
Addended by: Cresenciano Lick on: 10/26/2018 01:35 PM   Modules accepted: Orders

## 2018-10-26 NOTE — Progress Notes (Signed)
Subjective:    Patient ID: Bridget Grant, female    DOB: [birth year], 73 y.o.   MRN: [id number]  HPI  Here for wellness and f/u;  Overall doing ok;  Pt denies Chest pain, worsening SOB, DOE, wheezing, orthopnea, PND, worsening LE edema, palpitations, dizziness or syncope.  Pt denies neurological change such as new headache, facial or extremity weakness.  Pt denies polydipsia, polyuria, or low sugar symptoms. Pt states overall good compliance with treatment and medications, good tolerability, and has been trying to follow appropriate diet.  Pt denies worsening depressive symptoms, suicidal ideation or panic. No fever, night sweats, wt loss, loss of appetite, or other constitutional symptoms.  Pt states good ability with ADL's, has low fall risk, home safety reviewed and adequate, no other significant changes in hearing or vision, and only occasionally active with exercise.  Plans to be new patient with Dr Katy Fitch for eye exam. Overall lost 8 lbs in past yr.   Has been out of lipitor x 2 wks Wt Readings from Last 3 Encounters:  10/26/18 209 lb (94.8 kg)  04/27/18 207 lb (93.9 kg)  10/21/17 217 lb (98.4 kg)   Past Medical History:  Diagnosis Date  . Anxiety   . Depression   . Hyperlipidemia   . Hypertension   . Migraine   . Osteopenia   . Type II or unspecified type diabetes mellitus without mention of complication, uncontrolled 04/09/2013   Past Surgical History:  Procedure Laterality Date  . ABDOMINAL HYSTERECTOMY      reports that she has never smoked. She has never used smokeless tobacco. She reports that she does not drink alcohol or use drugs. family history includes Cancer in her mother; Hypertension in her father. No Known Allergies Current Outpatient Medications on File Prior to Visit  Medication Sig Dispense Refill  . aspirin EC 81 MG tablet Take 81 mg by mouth daily.    . Diclofenac Sodium 2 % SOLN Apply 1 pump twice daily. 112 g 3  . diltiazem (CARDIZEM CD) 240 MG 24 hr  capsule Take 1 capsule (240 mg total) by mouth daily. 90 capsule 1  . glipiZIDE (GLUCOTROL XL) 5 MG 24 hr tablet TAKE 1 TABLET(5 MG) BY MOUTH DAILY WITH BREAKFAST 90 tablet 1  . hydrochlorothiazide (MICROZIDE) 12.5 MG capsule Take 1 capsule (12.5 mg total) by mouth daily. 90 capsule 3  . metFORMIN (GLUCOPHAGE-XR) 500 MG 24 hr tablet Take 4 tablets (2,000 mg total) by mouth daily with breakfast. 360 tablet 3  . [DISCONTINUED] diltiazem (TIAZAC) 240 MG 24 hr capsule Take 1 capsule (240 mg total) by mouth daily. 90 capsule 3   No current facility-administered medications on file prior to visit.    Review of Systems Constitutional: Negative for other unusual diaphoresis, sweats, appetite or weight changes HENT: Negative for other worsening hearing loss, ear pain, facial swelling, mouth sores or neck stiffness.   Eyes: Negative for other worsening pain, redness or other visual disturbance.  Respiratory: Negative for other stridor or swelling Cardiovascular: Negative for other palpitations or other chest pain  Gastrointestinal: Negative for worsening diarrhea or loose stools, blood in stool, distention or other pain Genitourinary: Negative for hematuria, flank pain or other change in urine volume.  Musculoskeletal: Negative for myalgias or other joint swelling.  Skin: Negative for other color change, or other wound or worsening drainage.  Neurological: Negative for other syncope or numbness. Hematological: Negative for other adenopathy or swelling Psychiatric/Behavioral: Negative for hallucinations, other worsening agitation, SI,  self-injury, or new decreased concentration All other system neg per pt    Objective:   Physical Exam BP (!) 146/82 (BP Location: Left Arm, Patient Position: Sitting, Cuff Size: Large)   Pulse 70   Temp 98.5 F (36.9 C) (Oral)   Ht 5\' 2"  (1.575 m)   Wt 209 lb (94.8 kg)   SpO2 97%   BMI 38.23 kg/m  VS noted,  Constitutional: Pt is oriented to person, place, and  time. Appears well-developed and well-nourished, in no significant distress and comfortable Head: Normocephalic and atraumatic  Eyes: Conjunctivae and EOM are normal. Pupils are equal, round, and reactive to light Right Ear: External ear normal without discharge Left Ear: External ear normal without discharge Nose: Nose without discharge or deformity Mouth/Throat: Oropharynx is without other ulcerations and moist  Neck: Normal range of motion. Neck supple. No JVD present. No tracheal deviation present or significant neck LA or mass Cardiovascular: Normal rate, regular rhythm, normal heart sounds and intact distal pulses.   Pulmonary/Chest: WOB normal and breath sounds without rales or wheezing  Abdominal: Soft. Bowel sounds are normal. NT. No HSM  Musculoskeletal: Normal range of motion. Exhibits no edema Lymphadenopathy: Has no other cervical adenopathy.  Neurological: Pt is alert and oriented to person, place, and time. Pt has normal reflexes. No cranial nerve deficit. Motor grossly intact, Gait intact Skin: Skin is warm and dry. No rash noted or new ulcerations Psychiatric:  Has normal mood and affect. Behavior is normal without agitation No other exam findings  Lab Results  Component Value Date   WBC 9.4 10/21/2017   HGB 14.5 10/21/2017   HCT 42.5 10/21/2017   PLT 238.0 10/21/2017   GLUCOSE 91 04/27/2018   CHOL 130 04/27/2018   TRIG 117.0 04/27/2018   HDL 43.60 04/27/2018   LDLDIRECT 85.0 10/16/2016   LDLCALC 63 04/27/2018   ALT 29 04/27/2018   AST 26 04/27/2018   NA 138 04/27/2018   K 4.4 04/27/2018   CL 102 04/27/2018   CREATININE 0.68 04/27/2018   BUN 15 04/27/2018   CO2 28 04/27/2018   TSH 2.22 10/21/2017   HGBA1C 7.4 (H) 04/27/2018   MICROALBUR <0.7 10/21/2017      Assessment & Plan:

## 2018-10-26 NOTE — Patient Instructions (Signed)
You had the Tdap tetanus shot today  Please continue all other medications as before, and refills have been done if requested.  Please have the pharmacy call with any other refills you may need.  Please continue your efforts at being more active, low cholesterol diet, and weight control.  You are otherwise up to date with prevention measures today.  Please keep your appointments with your specialists as you may have planned  Please go to the LAB in the Basement (turn left off the elevator) for the tests to be done today  You will be contacted by phone if any changes need to be made immediately.  Otherwise, you will receive a letter about your results with an explanation, but please check with MyChart first.  Please remember to sign up for MyChart if you have not done so, as this will be important to you in the future with finding out test results, communicating by private email, and scheduling acute appointments online when needed.  Please return in 6 months, or sooner if needed 

## 2018-10-26 NOTE — Assessment & Plan Note (Signed)
stable overall by history and exam, recent data reviewed with pt, and pt to continue medical treatment as before,  to f/u any worsening symptoms or concerns, for f/u a1c with labs after metformin increased last visit

## 2018-10-28 ENCOUNTER — Other Ambulatory Visit: Payer: Self-pay | Admitting: Internal Medicine

## 2018-10-28 ENCOUNTER — Encounter: Payer: Self-pay | Admitting: Internal Medicine

## 2018-10-28 MED ORDER — GLIPIZIDE ER 10 MG PO TB24: 10.0000 mg | ORAL_TABLET | Freq: Every day | ORAL | 3 refills | Status: DC

## 2018-10-29 ENCOUNTER — Telehealth: Payer: Self-pay

## 2018-10-29 NOTE — Telephone Encounter (Signed)
Pt has been informed of results and expressed understanding.  °

## 2018-10-29 NOTE — Telephone Encounter (Signed)
-----   Message from [name], MD sent at 10/28/2018  6:51 PM EDT ----- Left message on MyChart, pt to cont same tx except   The test results show that your current treatment is OK, except the A1c is mildly elevated.  We should increase the glipizide ER to 10 mg per day.  I will send a new prescription, and you should hear from the office as well.Redmond Baseman to please inform pt, I will do rx

## 2018-12-15 DIAGNOSIS — D1801 Hemangioma of skin and subcutaneous tissue: Secondary | ICD-10-CM | POA: Diagnosis not present

## 2018-12-15 DIAGNOSIS — D2261 Melanocytic nevi of right upper limb, including shoulder: Secondary | ICD-10-CM | POA: Diagnosis not present

## 2018-12-15 DIAGNOSIS — L821 Other seborrheic keratosis: Secondary | ICD-10-CM | POA: Diagnosis not present

## 2018-12-15 DIAGNOSIS — D2271 Melanocytic nevi of right lower limb, including hip: Secondary | ICD-10-CM | POA: Diagnosis not present

## 2018-12-15 DIAGNOSIS — D225 Melanocytic nevi of trunk: Secondary | ICD-10-CM | POA: Diagnosis not present

## 2018-12-15 DIAGNOSIS — L817 Pigmented purpuric dermatosis: Secondary | ICD-10-CM | POA: Diagnosis not present

## 2019-01-06 ENCOUNTER — Telehealth: Payer: Self-pay | Admitting: Internal Medicine

## 2019-01-06 MED ORDER — DILTIAZEM HCL ER COATED BEADS 240 MG PO CP24: 240.0000 mg | ORAL_CAPSULE | Freq: Every day | ORAL | 1 refills | Status: DC

## 2019-01-06 NOTE — Telephone Encounter (Signed)
Medication Refill - Medication: diltiazem (CARDIZEM CD) 240 MG 24 hr capsule (Patient stated that pharmacy has reached out several time to get prescition signed off on but no response. Patient has a week supply left.)   Has the patient contacted their pharmacy?Yes (Agent: If no, request that the patient contact the pharmacy for the refill.) (Agent: If yes, when and what did the pharmacy advise?)Contact PCP  Preferred Pharmacy (with phone number or street name):  [facility] [phone] - [place], [address] AT [facility] [address] [place] [phone] (Phone) [phone] (Fax)     Agent: Please be advised that RX refills may take up to 3 business days. We ask that you follow-up with your pharmacy.

## 2019-04-29 ENCOUNTER — Ambulatory Visit: Payer: Medicare Other | Admitting: Internal Medicine

## 2019-05-11 ENCOUNTER — Ambulatory Visit (INDEPENDENT_AMBULATORY_CARE_PROVIDER_SITE_OTHER): Payer: Medicare Other | Admitting: Internal Medicine

## 2019-05-11 ENCOUNTER — Other Ambulatory Visit: Payer: Self-pay

## 2019-05-11 ENCOUNTER — Other Ambulatory Visit (INDEPENDENT_AMBULATORY_CARE_PROVIDER_SITE_OTHER): Payer: Medicare Other

## 2019-05-11 ENCOUNTER — Encounter: Payer: Self-pay | Admitting: Internal Medicine

## 2019-05-11 VITALS — BP 144/86 | HR 66 | Temp 98.4°F | Ht 62.0 in | Wt 203.0 lb

## 2019-05-11 DIAGNOSIS — I10 Essential (primary) hypertension: Secondary | ICD-10-CM | POA: Diagnosis not present

## 2019-05-11 DIAGNOSIS — Z23 Encounter for immunization: Secondary | ICD-10-CM | POA: Diagnosis not present

## 2019-05-11 DIAGNOSIS — E119 Type 2 diabetes mellitus without complications: Secondary | ICD-10-CM

## 2019-05-11 DIAGNOSIS — E538 Deficiency of other specified B group vitamins: Secondary | ICD-10-CM | POA: Diagnosis not present

## 2019-05-11 DIAGNOSIS — E611 Iron deficiency: Secondary | ICD-10-CM | POA: Diagnosis not present

## 2019-05-11 DIAGNOSIS — E559 Vitamin D deficiency, unspecified: Secondary | ICD-10-CM

## 2019-05-11 DIAGNOSIS — E785 Hyperlipidemia, unspecified: Secondary | ICD-10-CM

## 2019-05-11 DIAGNOSIS — Z0000 Encounter for general adult medical examination without abnormal findings: Secondary | ICD-10-CM

## 2019-05-11 LAB — LIPID PANEL
Cholesterol: 138 mg/dL (ref 0–200)
HDL: 46.1 mg/dL (ref >39.00)
LDL Cholesterol: 59 mg/dL (ref 0–99)
NonHDL: 91.46
Total CHOL/HDL Ratio: 3
Triglycerides: 164 mg/dL — ABNORMAL HIGH (ref 0.0–149.0)
VLDL: 32.8 mg/dL (ref 0.0–40.0)

## 2019-05-11 LAB — VITAMIN B12: Vitamin B-12: 163 pg/mL — ABNORMAL LOW (ref 211–911)

## 2019-05-11 LAB — HEMOGLOBIN A1C: Hgb A1c MFr Bld: 6.8 % — ABNORMAL HIGH (ref 4.6–6.5)

## 2019-05-11 LAB — HEPATIC FUNCTION PANEL
ALT: 23 U/L (ref 0–35)
AST: 19 U/L (ref 0–37)
Albumin: 4.5 g/dL (ref 3.5–5.2)
Alkaline Phosphatase: 57 U/L (ref 39–117)
Bilirubin, Direct: 0.1 mg/dL (ref 0.0–0.3)
Total Bilirubin: 0.5 mg/dL (ref 0.2–1.2)
Total Protein: 7.5 g/dL (ref 6.0–8.3)

## 2019-05-11 LAB — BASIC METABOLIC PANEL
BUN: 10 mg/dL (ref 6–23)
CO2: 26 mEq/L (ref 19–32)
Calcium: 9.6 mg/dL (ref 8.4–10.5)
Chloride: 102 mEq/L (ref 96–112)
Creatinine, Ser: 0.67 mg/dL (ref 0.40–1.20)
GFR: 86.52 mL/min (ref >60.00)
Glucose, Bld: 78 mg/dL (ref 70–99)
Potassium: 4.4 mEq/L (ref 3.5–5.1)
Sodium: 139 mEq/L (ref 135–145)

## 2019-05-11 LAB — IBC PANEL
Iron: 88 ug/dL (ref 42–145)
Saturation Ratios: 29.6 % (ref 20.0–50.0)
Transferrin: 212 mg/dL (ref 212.0–360.0)

## 2019-05-11 LAB — VITAMIN D 25 HYDROXY (VIT D DEFICIENCY, FRACTURES): VITD: 24.36 ng/mL — ABNORMAL LOW (ref 30.00–100.00)

## 2019-05-11 NOTE — Patient Instructions (Addendum)
You had the Pneumovax today  Please continue all other medications as before, and refills have been done if requested.  Please have the pharmacy call with any other refills you may need.  Please continue your efforts at being more active, low cholesterol diet, and weight control.  Please keep your appointments with your specialists as you may have planned  Please go to the LAB in the Basement (turn left off the elevator) for the tests to be done today  You will be contacted by phone if any changes need to be made immediately.  Otherwise, you will receive a letter about your results with an explanation, but please check with MyChart first.  Please remember to sign up for MyChart if you have not done so, as this will be important to you in the future with finding out test results, communicating by private email, and scheduling acute appointments online when needed.  Please return in 6 months, or sooner if needed, with Lab testing done 3-5 days before

## 2019-05-11 NOTE — Progress Notes (Signed)
Subjective:    Patient ID: Bridget Grant, female    DOB: September 24, 1945, 73 y.o.   MRN: [id number]  HPI  Here to f/u; overall doing ok,  Pt denies chest pain, increasing sob or doe, wheezing, orthopnea, PND, increased LE swelling, palpitations, dizziness or syncope.  Pt denies new neurological symptoms such as new headache, or facial or extremity weakness or numbness.  Pt denies polydipsia, polyuria, or low sugar episode.  Pt states overall good compliance with meds, mostly trying to follow appropriate diet, with wt overall stable.  No new complaints. Denies worsening depressive symptoms, suicidal ideation, or panic Past Medical History:  Diagnosis Date  . Anxiety   . Depression   . Hyperlipidemia   . Hypertension   . Migraine   . Osteopenia   . Type II or unspecified type diabetes mellitus without mention of complication, uncontrolled 04/09/2013   Past Surgical History:  Procedure Laterality Date  . ABDOMINAL HYSTERECTOMY      reports that she has never smoked. She has never used smokeless tobacco. She reports that she does not drink alcohol or use drugs. family history includes Cancer in her mother; Hypertension in her father. No Known Allergies Current Outpatient Medications on File Prior to Visit  Medication Sig Dispense Refill  . aspirin EC 81 MG tablet Take 81 mg by mouth daily.    Marland Kitchen atorvastatin (LIPITOR) 20 MG tablet Take 1 tablet (20 mg total) by mouth daily. 90 tablet 3  . Diclofenac Sodium 2 % SOLN Apply 1 pump twice daily. 112 g 3  . diltiazem (CARDIZEM CD) 240 MG 24 hr capsule Take 1 capsule (240 mg total) by mouth daily. 90 capsule 1  . glipiZIDE (GLUCOTROL XL) 10 MG 24 hr tablet Take 1 tablet (10 mg total) by mouth daily with breakfast. 90 tablet 3  . hydrochlorothiazide (MICROZIDE) 12.5 MG capsule Take 1 capsule (12.5 mg total) by mouth daily. 90 capsule 3  . lisinopril (ZESTRIL) 20 MG tablet Take 1 tablet (20 mg total) by mouth daily. 90 tablet 3  . metFORMIN  (GLUCOPHAGE-XR) 500 MG 24 hr tablet Take 4 tablets (2,000 mg total) by mouth daily with breakfast. 360 tablet 3  . [DISCONTINUED] diltiazem (TIAZAC) 240 MG 24 hr capsule Take 1 capsule (240 mg total) by mouth daily. 90 capsule 3   No current facility-administered medications on file prior to visit.    Review of Systems  Constitutional: Negative for other unusual diaphoresis or sweats HENT: Negative for ear discharge or swelling Eyes: Negative for other worsening visual disturbances Respiratory: Negative for stridor or other swelling  Gastrointestinal: Negative for worsening distension or other blood Genitourinary: Negative for retention or other urinary change Musculoskeletal: Negative for other MSK pain or swelling Skin: Negative for color change or other new lesions Neurological: Negative for worsening tremors and other numbness  Psychiatric/Behavioral: Negative for worsening agitation or other fatigue All otherwise neg per pt     Objective:   Physical Exam BP (!) 144/86 (BP Location: Left Arm, Patient Position: Sitting, Cuff Size: Large)   Pulse 66   Temp 98.4 F (36.9 C) (Oral)   Ht 5\' 2"  (1.575 m)   Wt 203 lb (92.1 kg)   SpO2 98%   BMI 37.13 kg/m  VS noted,  Constitutional: Pt appears in NAD HENT: Head: NCAT.  Right Ear: External ear normal.  Left Ear: External ear normal.  Eyes: . Pupils are equal, round, and reactive to light. Conjunctivae and EOM are normal Nose: without  d/c or deformity Neck: Neck supple. Gross normal ROM Cardiovascular: Normal rate and regular rhythm.   Pulmonary/Chest: Effort normal and breath sounds without rales or wheezing.  Abd:  Soft, NT, ND, + BS, no organomegaly Neurological: Pt is alert. At baseline orientation, motor grossly intact Skin: Skin is warm. No rashes, other new lesions, no LE edema Psychiatric: Pt behavior is normal without agitation  All otherwise neg per pt Lab Results  Component Value Date   WBC 8.9 10/26/2018   HGB  14.6 10/26/2018   HCT 42.9 10/26/2018   PLT 241.0 10/26/2018   GLUCOSE 78 05/11/2019   CHOL 138 05/11/2019   TRIG 164.0 (H) 05/11/2019   HDL 46.10 05/11/2019   LDLDIRECT 85.0 10/16/2016   LDLCALC 59 05/11/2019   ALT 23 05/11/2019   AST 19 05/11/2019   NA 139 05/11/2019   K 4.4 05/11/2019   CL 102 05/11/2019   CREATININE 0.67 05/11/2019   BUN 10 05/11/2019   CO2 26 05/11/2019   TSH 2.57 10/26/2018   HGBA1C 6.8 (H) 05/11/2019   MICROALBUR <0.7 10/26/2018      Assessment & Plan:

## 2019-05-13 ENCOUNTER — Encounter: Payer: Self-pay | Admitting: Internal Medicine

## 2019-05-13 ENCOUNTER — Other Ambulatory Visit: Payer: Self-pay | Admitting: Internal Medicine

## 2019-05-13 MED ORDER — VITAMIN D (ERGOCALCIFEROL) 1.25 MG (50000 UNIT) PO CAPS: 50000.0000 [IU] | ORAL_CAPSULE | ORAL | 0 refills | Status: DC

## 2019-05-16 ENCOUNTER — Encounter: Payer: Self-pay | Admitting: Internal Medicine

## 2019-05-16 NOTE — Assessment & Plan Note (Signed)
stable overall by history and exam, recent data reviewed with pt, and pt to continue medical treatment as before,  to f/u any worsening symptoms or concerns  

## 2019-05-27 ENCOUNTER — Other Ambulatory Visit: Payer: Self-pay

## 2019-05-27 ENCOUNTER — Ambulatory Visit (INDEPENDENT_AMBULATORY_CARE_PROVIDER_SITE_OTHER): Payer: Medicare Other

## 2019-05-27 DIAGNOSIS — E538 Deficiency of other specified B group vitamins: Secondary | ICD-10-CM | POA: Diagnosis not present

## 2019-05-27 MED ORDER — CYANOCOBALAMIN 1000 MCG/ML IJ SOLN
1000.0000 ug | Freq: Once | INTRAMUSCULAR | Status: AC
  Administered 2019-05-27: 1000 ug via INTRAMUSCULAR

## 2019-06-01 NOTE — Progress Notes (Signed)
Medical screening examination/treatment/procedure(s) were performed by non-physician practitioner and as supervising physician I was immediately available for consultation/collaboration. I agree with above. Gaege Sangalang, MD   

## 2019-06-11 DIAGNOSIS — C801 Malignant (primary) neoplasm, unspecified: Secondary | ICD-10-CM

## 2019-06-11 HISTORY — DX: Malignant (primary) neoplasm, unspecified: C80.1

## 2019-06-28 ENCOUNTER — Ambulatory Visit (INDEPENDENT_AMBULATORY_CARE_PROVIDER_SITE_OTHER): Payer: Medicare Other | Admitting: *Deleted

## 2019-06-28 ENCOUNTER — Other Ambulatory Visit: Payer: Self-pay

## 2019-06-28 DIAGNOSIS — E538 Deficiency of other specified B group vitamins: Secondary | ICD-10-CM | POA: Diagnosis not present

## 2019-06-28 MED ORDER — CYANOCOBALAMIN 1000 MCG/ML IJ SOLN
1000.0000 ug | Freq: Once | INTRAMUSCULAR | Status: AC
  Administered 2019-06-28: 1000 ug via INTRAMUSCULAR

## 2019-07-03 NOTE — Progress Notes (Signed)
Medical screening examination/treatment/procedure(s) were performed by non-physician practitioner and as supervising physician I was immediately available for consultation/collaboration. I agree with above. Yadira Hada, MD   

## 2019-07-15 ENCOUNTER — Other Ambulatory Visit: Payer: Self-pay

## 2019-07-15 NOTE — Telephone Encounter (Signed)
**  States that pharmacy reached out on Monday 07/12/2019 for refill and it was denied. Do not see any requests. Patient is out of medication.**  Medication Refill - Medication: metFORMIN (GLUCOPHAGE-XR) 500 MG 24 hr tablet   Has the patient contacted their pharmacy? Yes.   (Agent: If no, request that the patient contact the pharmacy for the refill.) (Agent: If yes, when and what did the pharmacy advise?)  Preferred Pharmacy (with phone number or street name): [facility] [place], [place]: Please be advised that RX refills may take up to 3 business days. We ask that you follow-up with your pharmacy.

## 2019-07-16 MED ORDER — METFORMIN HCL ER 500 MG PO TB24: 2000.0000 mg | ORAL_TABLET | Freq: Every day | ORAL | 1 refills | Status: DC

## 2019-07-16 NOTE — Telephone Encounter (Signed)
Reviewed chart pt is up-to-date sent refills to [facility].../lmb  

## 2019-07-28 ENCOUNTER — Ambulatory Visit (INDEPENDENT_AMBULATORY_CARE_PROVIDER_SITE_OTHER): Payer: Medicare Other | Admitting: *Deleted

## 2019-07-28 ENCOUNTER — Other Ambulatory Visit: Payer: Self-pay

## 2019-07-28 DIAGNOSIS — E538 Deficiency of other specified B group vitamins: Secondary | ICD-10-CM

## 2019-07-28 MED ORDER — CYANOCOBALAMIN 1000 MCG/ML IJ SOLN
1000.0000 ug | Freq: Once | INTRAMUSCULAR | Status: AC
  Administered 2019-07-28: 1000 ug via INTRAMUSCULAR

## 2019-07-28 NOTE — Progress Notes (Signed)
Medical screening examination/treatment/procedure(s) were performed by non-physician practitioner and as supervising physician I was immediately available for consultation/collaboration. I agree with above. Camdon Saetern, MD   

## 2019-07-29 ENCOUNTER — Ambulatory Visit: Payer: Medicare Other

## 2019-08-10 ENCOUNTER — Other Ambulatory Visit: Payer: Self-pay | Admitting: Internal Medicine

## 2019-08-25 ENCOUNTER — Ambulatory Visit (INDEPENDENT_AMBULATORY_CARE_PROVIDER_SITE_OTHER): Payer: Medicare Other | Admitting: *Deleted

## 2019-08-25 ENCOUNTER — Other Ambulatory Visit: Payer: Self-pay

## 2019-08-25 DIAGNOSIS — E538 Deficiency of other specified B group vitamins: Secondary | ICD-10-CM | POA: Diagnosis not present

## 2019-08-25 MED ORDER — CYANOCOBALAMIN 1000 MCG/ML IJ SOLN
1000.0000 ug | Freq: Once | INTRAMUSCULAR | Status: AC
  Administered 2019-08-25: 14:00:00 1000 ug via INTRAMUSCULAR

## 2019-08-25 NOTE — Progress Notes (Signed)
Pls cosign for B12 inj../lmb  

## 2019-09-29 ENCOUNTER — Other Ambulatory Visit: Payer: Self-pay

## 2019-09-29 ENCOUNTER — Ambulatory Visit (INDEPENDENT_AMBULATORY_CARE_PROVIDER_SITE_OTHER): Payer: Medicare Other | Admitting: *Deleted

## 2019-09-29 ENCOUNTER — Encounter: Payer: Self-pay | Admitting: *Deleted

## 2019-09-29 DIAGNOSIS — E538 Deficiency of other specified B group vitamins: Secondary | ICD-10-CM

## 2019-09-29 MED ORDER — CYANOCOBALAMIN 1000 MCG/ML IJ SOLN
1000.0000 ug | Freq: Once | INTRAMUSCULAR | Status: AC
  Administered 2019-09-29: 1000 ug via INTRAMUSCULAR

## 2019-09-29 NOTE — Progress Notes (Addendum)
Pls cosign for B12 inj.../lmb  Medical screening examination/treatment/procedure(s) were performed by non-physician practitioner and as supervising physician I was immediately available for consultation/collaboration. I agree with above. Cathlean Cower, MD

## 2019-10-04 ENCOUNTER — Other Ambulatory Visit: Payer: Self-pay | Admitting: Internal Medicine

## 2019-10-04 NOTE — Telephone Encounter (Signed)
Please refill as per office routine med refill policy (all routine meds refilled for 3 mo or monthly per pt preference up to one year from last visit, then month to month grace period for 3 mo, then further med refills will have to be denied)  

## 2019-10-07 ENCOUNTER — Other Ambulatory Visit: Payer: Self-pay | Admitting: Internal Medicine

## 2019-10-09 ENCOUNTER — Other Ambulatory Visit: Payer: Self-pay | Admitting: Internal Medicine

## 2019-10-09 NOTE — Telephone Encounter (Signed)
Please refill as per office routine med refill policy (all routine meds refilled for 3 mo or monthly per pt preference up to one year from last visit, then month to month grace period for 3 mo, then further med refills will have to be denied)  

## 2019-10-29 ENCOUNTER — Other Ambulatory Visit: Payer: Self-pay

## 2019-10-29 ENCOUNTER — Ambulatory Visit (INDEPENDENT_AMBULATORY_CARE_PROVIDER_SITE_OTHER): Payer: Medicare Other | Admitting: *Deleted

## 2019-10-29 DIAGNOSIS — E538 Deficiency of other specified B group vitamins: Secondary | ICD-10-CM | POA: Diagnosis not present

## 2019-10-29 MED ORDER — CYANOCOBALAMIN 1000 MCG/ML IJ SOLN
1000.0000 ug | Freq: Once | INTRAMUSCULAR | Status: AC
  Administered 2019-10-29: 1000 ug via INTRAMUSCULAR

## 2019-10-29 NOTE — Progress Notes (Signed)
Pls cosign for B12 inj../lmb  

## 2019-11-06 ENCOUNTER — Other Ambulatory Visit: Payer: Self-pay | Admitting: Internal Medicine

## 2019-11-06 NOTE — Telephone Encounter (Signed)
Please refill as per office routine med refill policy (all routine meds refilled for 3 mo or monthly per pt preference up to one year from last visit, then month to month grace period for 3 mo, then further med refills will have to be denied)  

## 2019-11-09 ENCOUNTER — Ambulatory Visit: Payer: Medicare Other | Admitting: Internal Medicine

## 2019-11-16 ENCOUNTER — Encounter: Payer: Self-pay | Admitting: Internal Medicine

## 2019-11-16 ENCOUNTER — Ambulatory Visit (INDEPENDENT_AMBULATORY_CARE_PROVIDER_SITE_OTHER): Payer: Medicare Other

## 2019-11-16 ENCOUNTER — Ambulatory Visit (INDEPENDENT_AMBULATORY_CARE_PROVIDER_SITE_OTHER): Payer: Medicare Other | Admitting: Internal Medicine

## 2019-11-16 ENCOUNTER — Other Ambulatory Visit: Payer: Self-pay

## 2019-11-16 VITALS — BP 126/80 | HR 75 | Temp 98.6°F | Ht 62.0 in | Wt 205.0 lb

## 2019-11-16 VITALS — BP 126/80 | HR 75 | Ht 62.0 in | Wt 205.0 lb

## 2019-11-16 DIAGNOSIS — E785 Hyperlipidemia, unspecified: Secondary | ICD-10-CM | POA: Diagnosis not present

## 2019-11-16 DIAGNOSIS — E538 Deficiency of other specified B group vitamins: Secondary | ICD-10-CM | POA: Diagnosis not present

## 2019-11-16 DIAGNOSIS — Z0000 Encounter for general adult medical examination without abnormal findings: Secondary | ICD-10-CM | POA: Diagnosis not present

## 2019-11-16 DIAGNOSIS — E559 Vitamin D deficiency, unspecified: Secondary | ICD-10-CM | POA: Diagnosis not present

## 2019-11-16 DIAGNOSIS — E119 Type 2 diabetes mellitus without complications: Secondary | ICD-10-CM | POA: Diagnosis not present

## 2019-11-16 DIAGNOSIS — I10 Essential (primary) hypertension: Secondary | ICD-10-CM | POA: Diagnosis not present

## 2019-11-16 LAB — VITAMIN D 25 HYDROXY (VIT D DEFICIENCY, FRACTURES): VITD: 27.55 ng/mL — ABNORMAL LOW (ref 30.00–100.00)

## 2019-11-16 LAB — CBC WITH DIFFERENTIAL/PLATELET
Basophils Absolute: 0.1 10*3/uL (ref 0.0–0.1)
Basophils Relative: 0.5 % (ref 0.0–3.0)
Eosinophils Absolute: 0.2 10*3/uL (ref 0.0–0.7)
Eosinophils Relative: 2.2 % (ref 0.0–5.0)
HCT: 42.5 % (ref 36.0–46.0)
Hemoglobin: 14.2 g/dL (ref 12.0–15.0)
Lymphocytes Relative: 30.2 % (ref 12.0–46.0)
Lymphs Abs: 3.4 10*3/uL (ref 0.7–4.0)
MCHC: 33.4 g/dL (ref 30.0–36.0)
MCV: 96.3 fl (ref 78.0–100.0)
Monocytes Absolute: 1.1 10*3/uL — ABNORMAL HIGH (ref 0.1–1.0)
Monocytes Relative: 10 % (ref 3.0–12.0)
Neutro Abs: 6.5 10*3/uL (ref 1.4–7.7)
Neutrophils Relative %: 57.1 % (ref 43.0–77.0)
Platelets: 251 10*3/uL (ref 150.0–400.0)
RBC: 4.42 Mil/uL (ref 3.87–5.11)
RDW: 13.5 % (ref 11.5–15.5)
WBC: 11.3 10*3/uL — ABNORMAL HIGH (ref 4.0–10.5)

## 2019-11-16 LAB — HEPATIC FUNCTION PANEL
ALT: 22 U/L (ref 0–35)
AST: 21 U/L (ref 0–37)
Albumin: 4.5 g/dL (ref 3.5–5.2)
Alkaline Phosphatase: 51 U/L (ref 39–117)
Bilirubin, Direct: 0.1 mg/dL (ref 0.0–0.3)
Total Bilirubin: 0.4 mg/dL (ref 0.2–1.2)
Total Protein: 7.5 g/dL (ref 6.0–8.3)

## 2019-11-16 LAB — BASIC METABOLIC PANEL
BUN: 17 mg/dL (ref 6–23)
CO2: 27 mEq/L (ref 19–32)
Calcium: 10 mg/dL (ref 8.4–10.5)
Chloride: 101 mEq/L (ref 96–112)
Creatinine, Ser: 0.83 mg/dL (ref 0.40–1.20)
GFR: 67.48 mL/min (ref >60.00)
Glucose, Bld: 90 mg/dL (ref 70–99)
Potassium: 4.2 mEq/L (ref 3.5–5.1)
Sodium: 137 mEq/L (ref 135–145)

## 2019-11-16 LAB — LIPID PANEL
Cholesterol: 142 mg/dL (ref 0–200)
HDL: 41.9 mg/dL (ref >39.00)
LDL Cholesterol: 62 mg/dL (ref 0–99)
NonHDL: 100.25
Total CHOL/HDL Ratio: 3
Triglycerides: 192 mg/dL — ABNORMAL HIGH (ref 0.0–149.0)
VLDL: 38.4 mg/dL (ref 0.0–40.0)

## 2019-11-16 LAB — MICROALBUMIN / CREATININE URINE RATIO
Creatinine,U: 139.3 mg/dL
Microalb Creat Ratio: 0.8 mg/g (ref 0.0–30.0)
Microalb, Ur: 1.1 mg/dL (ref 0.0–1.9)

## 2019-11-16 LAB — HEMOGLOBIN A1C: Hgb A1c MFr Bld: 8.6 % — ABNORMAL HIGH (ref 4.6–6.5)

## 2019-11-16 LAB — VITAMIN B12: Vitamin B-12: >1526 pg/mL — ABNORMAL HIGH (ref 211–911)

## 2019-11-16 LAB — TSH: TSH: 2.21 u[IU]/mL (ref 0.35–4.50)

## 2019-11-16 MED ORDER — VITAMIN B-12 1000 MCG PO TABS: 1000.0000 ug | ORAL_TABLET | Freq: Every day | ORAL | 3 refills | Status: DC

## 2019-11-16 MED ORDER — CYANOCOBALAMIN 1000 MCG/ML IJ SOLN
1000.0000 ug | Freq: Once | INTRAMUSCULAR | Status: AC
  Administered 2019-11-16: 1000 ug via INTRAMUSCULAR

## 2019-11-16 NOTE — Assessment & Plan Note (Signed)
stable overall by history and exam, recent data reviewed with pt, and pt to continue medical treatment as before,  to f/u any worsening symptoms or concerns  

## 2019-11-16 NOTE — Assessment & Plan Note (Addendum)
stable overall by history and exam, recent data reviewed with pt, and pt to continue medical treatment as before,  to f/u any worsening symptoms or concerns  I spent 31 minutes in preparing to see the patient by review of recent labs, imaging and procedures, obtaining and reviewing separately obtained history, communicating with the patient and family or caregiver, ordering medications, tests or procedures, and documenting clinical information in the EHR including the differential Dx, treatment, and any further evaluation and other management of dm, htn, hld, vit b12 and d deficiency

## 2019-11-16 NOTE — Assessment & Plan Note (Signed)
Cont oral replacement 

## 2019-11-16 NOTE — Progress Notes (Signed)
Subjective:   Bridget Grant is a 74 y.o. female who presents for Medicare Annual (Subsequent) preventive examination.  Review of Systems:  No ROS. Medicare Wellness Visit Cardiac Risk Factors include: advanced age (>27men, >21 women);diabetes mellitus;dyslipidemia;hypertension;obesity (BMI >30kg/m2)     Objective:     Vitals: BP 126/80    Pulse 75    Ht 5\' 2"  (1.575 m)    Wt 205 lb (93 kg)    SpO2 97%    BMI 37.49 kg/m   Body mass index is 37.49 kg/m.  Advanced Directives 11/16/2019 03/15/2014  Does Patient Have a Medical Advance Directive? No No  Would patient like information on creating a medical advance directive? No - Patient declined No - patient declined information    Tobacco Social History   Tobacco Use  Smoking Status Never Smoker  Smokeless Tobacco Never Used     Counseling given: No   Clinical Intake:  Pre-visit preparation completed: Yes  Pain : No/denies pain Pain Score: 0-No pain     BMI - recorded: 37.49 Nutritional Status: BMI > 30  Obese Nutritional Risks: None Diabetes: Yes CBG done?: No Did pt. bring in CBG monitor from home?: No  How often do you need to have someone help you when you read instructions, pamphlets, or other written materials from your doctor or pharmacy?: 1 - Never What is the last grade level you completed in school?: High School Graduate  Interpreter Needed?: No  Information entered by :: Obed Samek N. Kikuye Korenek, LPN  Past Medical History:  Diagnosis Date   Anxiety    Depression    Hyperlipidemia    Hypertension    Migraine    Osteopenia    Type II or unspecified type diabetes mellitus without mention of complication, uncontrolled 04/09/2013   Past Surgical History:  Procedure Laterality Date   ABDOMINAL HYSTERECTOMY     Family History  Problem Relation Age of Onset   Cancer Mother    Hypertension Father    Social History   Socioeconomic History   Marital status: Married    Spouse name: Not  on file   Number of children: Not on file   Years of education: Not on file   Highest education level: Not on file  Occupational History   Not on file  Tobacco Use   Smoking status: Never Smoker   Smokeless tobacco: Never Used  Substance and Sexual Activity   Alcohol use: No   Drug use: No   Sexual activity: Not on file  Other Topics Concern   Not on file  Social History Narrative   Not on file   Social Determinants of Health   Financial Resource Strain:    Difficulty of Paying Living Expenses:   Food Insecurity:    Worried About Charity fundraiser in the Last Year:    Arboriculturist in the Last Year:   Transportation Needs:    Film/video editor (Medical):    Lack of Transportation (Non-Medical):   Physical Activity:    Days of Exercise per Week:    Minutes of Exercise per Session:   Stress:    Feeling of Stress :   Social Connections:    Frequency of Communication with Friends and Family:    Frequency of Social Gatherings with Friends and Family:    Attends Religious Services:    Active Member of Clubs or Organizations:    Attends Archivist Meetings:    Marital Status:  Outpatient Encounter Medications as of 11/16/2019  Medication Sig   aspirin EC 81 MG tablet Take 81 mg by mouth daily.   atorvastatin (LIPITOR) 20 MG tablet TAKE 1 TABLET BY MOUTH EVERY DAY   diltiazem (CARDIZEM CD) 240 MG 24 hr capsule TAKE 1 CAPSULE(240 MG) BY MOUTH DAILY   glipiZIDE (GLUCOTROL XL) 10 MG 24 hr tablet TAKE 1 TABLET BY MOUTH DAILY WITH BREAKFAST   hydrochlorothiazide (MICROZIDE) 12.5 MG capsule Take 1 capsule (12.5 mg total) by mouth daily.   lisinopril (ZESTRIL) 20 MG tablet TAKE 1 TABLET BY MOUTH EVERY DAY   metFORMIN (GLUCOPHAGE-XR) 500 MG 24 hr tablet Take 4 tablets (2,000 mg total) by mouth daily with breakfast.   [DISCONTINUED] diltiazem (TIAZAC) 240 MG 24 hr capsule Take 1 capsule (240 mg total) by mouth daily.   No  facility-administered encounter medications on file as of 11/16/2019.    Activities of Daily Living In your present state of health, do you have any difficulty performing the following activities: 11/16/2019  Hearing? N  Vision? N  Difficulty concentrating or making decisions? N  Walking or climbing stairs? N  Dressing or bathing? N  Doing errands, shopping? N  Preparing Food and eating ? N  Using the Toilet? N  In the past six months, have you accidently leaked urine? N  Do you have problems with loss of bowel control? N  Managing your Medications? N  Managing your Finances? N  Housekeeping or managing your Housekeeping? N  Some recent data might be hidden    Patient Care Team: [name], MD as PCP - General    Assessment:   This is a routine wellness examination for [facility].  Exercise Activities and Dietary recommendations Current Exercise Habits: The patient does not participate in regular exercise at present, Exercise limited by: None identified  Goals     Client understands the importance of follow-up with providers by attending scheduled visits (pt-stated)     Start eating and making better food choices for my health.       Fall Risk Fall Risk  11/16/2019 11/16/2019 10/26/2018 10/21/2017 10/21/2017  Falls in the past year? 0 0 0 No No  Number falls in past yr: - 0 - - -  Injury with Fall? - 0 - - -  Risk for fall due to : - No Fall Risks - - -  Follow up - Falls evaluation completed - - -   Is the patient's home free of loose throw rugs in walkways, pet beds, electrical cords, etc?   yes      Grab bars in the bathroom? yes      Handrails on the stairs?   yes      Adequate lighting?   yes  Timed Get Up and Go performed: not indicated  Depression Screen PHQ 2/9 Scores 11/16/2019 11/16/2019 10/26/2018 10/16/2016  PHQ - 2 Score 0 0 0 0     Cognitive Function     6CIT Screen 11/16/2019  What Year? 0 points  What month? 0 points  What time? 0 points  Count back from 20  0 points  Months in reverse 0 points  Repeat phrase 0 points  Total Score 0    Immunization History  Administered Date(s) Administered   Influenza Split 03/04/2011, 03/03/2012   Influenza Whole 03/08/2009, 03/07/2010   Influenza, High Dose Seasonal PF 04/06/2013, 04/23/2017, 02/02/2019   Influenza,inj,Quad PF,6+ Mos 02/24/2014, 04/14/2015   Influenza-Unspecified 02/03/2018   PFIZER SARS-COV-2 Vaccination 06/11/2019,  07/05/2019   Pneumococcal Conjugate-13 04/13/2014   Pneumococcal Polysaccharide-23 05/11/2019   Td 08/03/2008   Tdap 10/26/2018   Zoster 08/03/2008   Zoster Recombinat (Shingrix) 10/31/2017, 01/23/2018, 03/25/2018    Qualifies for Shingles Vaccine? Yes  Screening Tests Health Maintenance  Topic Date Due   HEMOGLOBIN A1C  11/09/2019   OPHTHALMOLOGY EXAM  11/16/2019 (Originally 05/16/1956)   MAMMOGRAM  05/10/2020 (Originally 05/16/1997)   COLONOSCOPY  05/10/2020 (Originally 05/16/1997)   INFLUENZA VACCINE  01/09/2020   FOOT EXAM  11/15/2020   TETANUS/TDAP  10/25/2028   DEXA SCAN  Completed   COVID-19 Vaccine  Completed   Hepatitis C Screening  Completed   PNA vac Low Risk Adult  Completed    Cancer Screenings: Lung: Low Dose CT Chest recommended if Age 41-80 years, 30 pack-year currently smoking OR have quit w/in 15years. Patient does qualify. Breast:  Up to date on Mammogram? No   Up to date of Bone Density/Dexa? No Colorectal: No  Additional Screenings: Hepatitis C Screening: completed     Plan:      Reviewed health maintenance screenings with patient today and relevant education, vaccines, and/or referrals were provided.    Continue doing brain stimulating activities (puzzles, reading, adult coloring books, staying active) to keep memory sharp.    Continue to eat heart healthy diet (full of fruits, vegetables, whole grains, lean protein, water--limit salt, fat, and sugar intake) and increase physical activity as  tolerated.   I have personally reviewed and noted the following in the patients chart:    Medical and social history  Use of alcohol, tobacco or illicit drugs   Current medications and supplements  Functional ability and status  Nutritional status  Physical activity  Advanced directives  List of other physicians  Hospitalizations, surgeries, and ER visits in previous 12 months  Vitals  Screenings to include cognitive, depression, and falls  Referrals and appointments  In addition, I have reviewed and discussed with patient certain preventive protocols, quality metrics, and best practice recommendations. A written personalized care plan for preventive services as well as general preventive health recommendations were provided to patient.     Sheral Flow, LPN  09/14/5679  Nurse Health Advisor

## 2019-11-16 NOTE — Assessment & Plan Note (Signed)
For b12 1000 mcg im, thenn change to oral

## 2019-11-16 NOTE — Patient Instructions (Signed)
You had the B12 shot today  Then, ok to change to the B12 1000 mcg per day OTC for 1 yr  Please continue all other medications as before, and refills have been done if requested.  Please have the pharmacy call with any other refills you may need.  Please continue your efforts at being more active, low cholesterol diet, and weight control.  You are otherwise up to date with prevention measures today.  Please keep your appointments with your specialists as you may have planned  Please go to the LAB at the blood drawing area for the tests to be done  You will be contacted by phone if any changes need to be made immediately.  Otherwise, you will receive a letter about your results with an explanation, but please check with MyChart first.  Please remember to sign up for MyChart if you have not done so, as this will be important to you in the future with finding out test results, communicating by private email, and scheduling acute appointments online when needed.  Please make an Appointment to return in 6 months, or sooner if needed

## 2019-11-16 NOTE — Progress Notes (Signed)
Subjective:    Patient ID: Bridget Grant, female    DOB: 21-Mar-1946, 74 y.o.   MRN: [id number]  HPI  Here to f/u; overall doing ok,  Pt denies chest pain, increasing sob or doe, wheezing, orthopnea, PND, increased LE swelling, palpitations, dizziness or syncope.  Pt denies new neurological symptoms such as new headache, or facial or extremity weakness or numbness.  Pt denies polydipsia, polyuria, or low sugar episode.  Pt states overall good compliance with meds, mostly trying to follow appropriate diet, with wt overall stable,  but little exercise however. Plans to see optho f/u soon.  Has been tolerating b12 shots and oral vit d Past Medical History:  Diagnosis Date  . Anxiety   . Depression   . Hyperlipidemia   . Hypertension   . Migraine   . Osteopenia   . Type II or unspecified type diabetes mellitus without mention of complication, uncontrolled 04/09/2013   Past Surgical History:  Procedure Laterality Date  . ABDOMINAL HYSTERECTOMY      reports that she has never smoked. She has never used smokeless tobacco. She reports that she does not drink alcohol or use drugs. family history includes Cancer in her mother; Hypertension in her father. No Known Allergies Current Outpatient Medications on File Prior to Visit  Medication Sig Dispense Refill  . aspirin EC 81 MG tablet Take 81 mg by mouth daily.    Marland Kitchen atorvastatin (LIPITOR) 20 MG tablet TAKE 1 TABLET BY MOUTH EVERY DAY 90 tablet 3  . diltiazem (CARDIZEM CD) 240 MG 24 hr capsule TAKE 1 CAPSULE(240 MG) BY MOUTH DAILY 90 capsule 1  . glipiZIDE (GLUCOTROL XL) 10 MG 24 hr tablet TAKE 1 TABLET BY MOUTH DAILY WITH BREAKFAST 90 tablet 0  . hydrochlorothiazide (MICROZIDE) 12.5 MG capsule Take 1 capsule (12.5 mg total) by mouth daily. 90 capsule 3  . lisinopril (ZESTRIL) 20 MG tablet TAKE 1 TABLET BY MOUTH EVERY DAY 90 tablet 3  . metFORMIN (GLUCOPHAGE-XR) 500 MG 24 hr tablet Take 4 tablets (2,000 mg total) by mouth daily with breakfast.  360 tablet 1  . [DISCONTINUED] diltiazem (TIAZAC) 240 MG 24 hr capsule Take 1 capsule (240 mg total) by mouth daily. 90 capsule 3   No current facility-administered medications on file prior to visit.   Review of Systems All otherwise neg per pt     Objective:   Physical Exam BP 126/80 (BP Location: Left Arm, Patient Position: Sitting, Cuff Size: Large)   Pulse 75   Temp 98.6 F (37 C) (Oral)   Ht 5\' 2"  (1.575 m)   Wt 205 lb (93 kg)   SpO2 97%   BMI 37.49 kg/m  VS noted,  Constitutional: Pt appears in NAD HENT: Head: NCAT.  Right Ear: External ear normal.  Left Ear: External ear normal.  Eyes: . Pupils are equal, round, and reactive to light. Conjunctivae and EOM are normal Nose: without d/c or deformity Neck: Neck supple. Gross normal ROM Cardiovascular: Normal rate and regular rhythm.   Pulmonary/Chest: Effort normal and breath sounds without rales or wheezing.  Abd:  Soft, NT, ND, + BS, no organomegaly Neurological: Pt is alert. At baseline orientation, motor grossly intact Skin: Skin is warm. No rashes, other new lesions, no LE edema Psychiatric: Pt behavior is normal without agitation  All otherwise neg per pt Lab Results  Component Value Date   WBC 8.9 10/26/2018   HGB 14.6 10/26/2018   HCT 42.9 10/26/2018   PLT 241.0 10/26/2018  GLUCOSE 78 05/11/2019   CHOL 138 05/11/2019   TRIG 164.0 (H) 05/11/2019   HDL 46.10 05/11/2019   LDLDIRECT 85.0 10/16/2016   LDLCALC 59 05/11/2019   ALT 23 05/11/2019   AST 19 05/11/2019   NA 139 05/11/2019   K 4.4 05/11/2019   CL 102 05/11/2019   CREATININE 0.67 05/11/2019   BUN 10 05/11/2019   CO2 26 05/11/2019   TSH 2.57 10/26/2018   HGBA1C 6.8 (H) 05/11/2019   MICROALBUR <0.7 10/26/2018      Assessment & Plan:

## 2019-11-16 NOTE — Patient Instructions (Signed)
Bridget Grant , Thank you for taking time to come for your Medicare Wellness Visit. I appreciate your ongoing commitment to your health goals. Please review the following plan we discussed and let me know if I can assist you in the future.   Screening recommendations/referrals: Colonoscopy: never done; patient declined Mammogram: will schedule Bone Density: never done Recommended yearly ophthalmology/optometry visit for glaucoma screening and checkup Recommended yearly dental visit for hygiene and checkup  Vaccinations: Influenza vaccine: 04/07/2019 Pneumococcal vaccine: completed Tdap vaccine: 10/26/2018 Shingles vaccine: completed   Covid-19: completed  Advanced directives: No; patient declined paperwork  Conditions/risks identified: Yes  Next appointment: Please schedule your 1 year Medicare Wellness Visit with your Health Coach.   Preventive Care 73 Years and Older, Female Preventive care refers to lifestyle choices and visits with your health care provider that can promote health and wellness. What does preventive care include?  A yearly physical exam. This is also called an annual well check.  Dental exams once or twice a year.  Routine eye exams. Ask your health care provider how often you should have your eyes checked.  Personal lifestyle choices, including:  Daily care of your teeth and gums.  Regular physical activity.  Eating a healthy diet.  Avoiding tobacco and drug use.  Limiting alcohol use.  Practicing safe sex.  Taking low-dose aspirin every day.  Taking vitamin and mineral supplements as recommended by your health care provider. What happens during an annual well check? The services and screenings done by your health care provider during your annual well check will depend on your age, overall health, lifestyle risk factors, and family history of disease. Counseling  Your health care provider may ask you questions about your:  Alcohol  use.  Tobacco use.  Drug use.  Emotional well-being.  Home and relationship well-being.  Sexual activity.  Eating habits.  History of falls.  Memory and ability to understand (cognition).  Work and work Statistician.  Reproductive health. Screening  You may have the following tests or measurements:  Height, weight, and BMI.  Blood pressure.  Lipid and cholesterol levels. These may be checked every 5 years, or more frequently if you are over 73 years old.  Skin check.  Lung cancer screening. You may have this screening every year starting at age 73 if you have a 30-pack-year history of smoking and currently smoke or have quit within the past 15 years.  Fecal occult blood test (FOBT) of the stool. You may have this test every year starting at age 73.  Flexible sigmoidoscopy or colonoscopy. You may have a sigmoidoscopy every 5 years or a colonoscopy every 10 years starting at age 73.  Hepatitis C blood test.  Hepatitis B blood test.  Sexually transmitted disease (STD) testing.  Diabetes screening. This is done by checking your blood sugar (glucose) after you have not eaten for a while (fasting). You may have this done every 1-3 years.  Bone density scan. This is done to screen for osteoporosis. You may have this done starting at age 73.  Mammogram. This may be done every 1-2 years. Talk to your health care provider about how often you should have regular mammograms. Talk with your health care provider about your test results, treatment options, and if necessary, the need for more tests. Vaccines  Your health care provider may recommend certain vaccines, such as:  Influenza vaccine. This is recommended every year.  Tetanus, diphtheria, and acellular pertussis (Tdap, Td) vaccine. You may need a Td  booster every 10 years.  Zoster vaccine. You may need this after age 5.  Pneumococcal 13-valent conjugate (PCV13) vaccine. One dose is recommended after age  73.  Pneumococcal polysaccharide (PPSV23) vaccine. One dose is recommended after age 73. Talk to your health care provider about which screenings and vaccines you need and how often you need them. This information is not intended to replace advice given to you by your health care provider. Make sure you discuss any questions you have with your health care provider. Document Released: 06/23/2015 Document Revised: 02/14/2016 Document Reviewed: 03/28/2015 Elsevier Interactive Patient Education  2017 [place] Prevention in the Home Falls can cause injuries. They can happen to people of all ages. There are many things you can do to make your home safe and to help prevent falls. What can I do on the outside of my home?  Regularly fix the edges of walkways and driveways and fix any cracks.  Remove anything that might make you trip as you walk through a door, such as a raised step or threshold.  Trim any bushes or trees on the path to your home.  Use bright outdoor lighting.  Clear any walking paths of anything that might make someone trip, such as rocks or tools.  Regularly check to see if handrails are loose or broken. Make sure that both sides of any steps have handrails.  Any raised decks and porches should have guardrails on the edges.  Have any leaves, snow, or ice cleared regularly.  Use sand or salt on walking paths during winter.  Clean up any spills in your garage right away. This includes oil or grease spills. What can I do in the bathroom?  Use night lights.  Install grab bars by the toilet and in the tub and shower. Do not use towel bars as grab bars.  Use non-skid mats or decals in the tub or shower.  If you need to sit down in the shower, use a plastic, non-slip stool.  Keep the floor dry. Clean up any water that spills on the floor as soon as it happens.  Remove soap buildup in the tub or shower regularly.  Attach bath mats securely with double-sided  non-slip rug tape.  Do not have throw rugs and other things on the floor that can make you trip. What can I do in the bedroom?  Use night lights.  Make sure that you have a light by your bed that is easy to reach.  Do not use any sheets or blankets that are too big for your bed. They should not hang down onto the floor.  Have a firm chair that has side arms. You can use this for support while you get dressed.  Do not have throw rugs and other things on the floor that can make you trip. What can I do in the kitchen?  Clean up any spills right away.  Avoid walking on wet floors.  Keep items that you use a lot in easy-to-reach places.  If you need to reach something above you, use a strong step stool that has a grab bar.  Keep electrical cords out of the way.  Do not use floor polish or wax that makes floors slippery. If you must use wax, use non-skid floor wax.  Do not have throw rugs and other things on the floor that can make you trip. What can I do with my stairs?  Do not leave any items on the stairs.  Make sure that there are handrails on both sides of the stairs and use them. Fix handrails that are broken or loose. Make sure that handrails are as long as the stairways.  Check any carpeting to make sure that it is firmly attached to the stairs. Fix any carpet that is loose or worn.  Avoid having throw rugs at the top or bottom of the stairs. If you do have throw rugs, attach them to the floor with carpet tape.  Make sure that you have a light switch at the top of the stairs and the bottom of the stairs. If you do not have them, ask someone to add them for you. What else can I do to help prevent falls?  Wear shoes that:  Do not have high heels.  Have rubber bottoms.  Are comfortable and fit you well.  Are closed at the toe. Do not wear sandals.  If you use a stepladder:  Make sure that it is fully opened. Do not climb a closed stepladder.  Make sure that both  sides of the stepladder are locked into place.  Ask someone to hold it for you, if possible.  Clearly mark and make sure that you can see:  Any grab bars or handrails.  First and last steps.  Where the edge of each step is.  Use tools that help you move around (mobility aids) if they are needed. These include:  Canes.  Walkers.  Scooters.  Crutches.  Turn on the lights when you go into a dark area. Replace any light bulbs as soon as they burn out.  Set up your furniture so you have a clear path. Avoid moving your furniture around.  If any of your floors are uneven, fix them.  If there are any pets around you, be aware of where they are.  Review your medicines with your doctor. Some medicines can make you feel dizzy. This can increase your chance of falling. Ask your doctor what other things that you can do to help prevent falls. This information is not intended to replace advice given to you by your health care provider. Make sure you discuss any questions you have with your health care provider. Document Released: 03/23/2009 Document Revised: 11/02/2015 Document Reviewed: 07/01/2014 Elsevier Interactive Patient Education  2017 [employer].

## 2019-11-17 ENCOUNTER — Other Ambulatory Visit: Payer: Self-pay | Admitting: Internal Medicine

## 2019-11-17 ENCOUNTER — Encounter: Payer: Self-pay | Admitting: Internal Medicine

## 2019-11-17 LAB — URINALYSIS, ROUTINE W REFLEX MICROSCOPIC
Bilirubin Urine: NEGATIVE
Hgb urine dipstick: NEGATIVE
Ketones, ur: NEGATIVE
Leukocytes,Ua: NEGATIVE
Nitrite: NEGATIVE
RBC / HPF: NONE SEEN (ref 0–?)
Specific Gravity, Urine: >=1.03 — AB (ref 1.000–1.030)
Total Protein, Urine: NEGATIVE
Urine Glucose: NEGATIVE
Urobilinogen, UA: 0.2 (ref 0.0–1.0)
pH: 5 (ref 5.0–8.0)

## 2019-11-17 MED ORDER — VITAMIN D (ERGOCALCIFEROL) 1.25 MG (50000 UNIT) PO CAPS: 50000.0000 [IU] | ORAL_CAPSULE | ORAL | 0 refills | Status: DC

## 2019-11-17 MED ORDER — PIOGLITAZONE HCL 15 MG PO TABS: 15.0000 mg | ORAL_TABLET | Freq: Every day | ORAL | 3 refills | Status: DC

## 2019-12-06 ENCOUNTER — Other Ambulatory Visit: Payer: Self-pay | Admitting: Internal Medicine

## 2020-01-05 ENCOUNTER — Other Ambulatory Visit: Payer: Self-pay | Admitting: Internal Medicine

## 2020-01-05 NOTE — Telephone Encounter (Signed)
Please refill as per office routine med refill policy (all routine meds refilled for 3 mo or monthly per pt preference up to one year from last visit, then month to month grace period for 3 mo, then further med refills will have to be denied)  

## 2020-01-11 ENCOUNTER — Other Ambulatory Visit: Payer: Self-pay

## 2020-01-11 ENCOUNTER — Ambulatory Visit (INDEPENDENT_AMBULATORY_CARE_PROVIDER_SITE_OTHER): Payer: Medicare Other | Admitting: Internal Medicine

## 2020-01-11 ENCOUNTER — Encounter: Payer: Self-pay | Admitting: Internal Medicine

## 2020-01-11 VITALS — BP 132/62 | HR 90 | Temp 98.6°F | Resp 16 | Ht 62.0 in | Wt 205.2 lb

## 2020-01-11 DIAGNOSIS — K920 Hematemesis: Secondary | ICD-10-CM

## 2020-01-11 DIAGNOSIS — K921 Melena: Secondary | ICD-10-CM | POA: Diagnosis not present

## 2020-01-11 NOTE — Patient Instructions (Signed)
Hematemesis Hematemesis is when you vomit blood. It is a sign of bleeding in the upper GI tract (gastrointestinal tract). The upper GI tract includes the mouth, throat, esophagus, stomach, and the upper part of the small intestine (duodenum). Hematemesis is usually caused by bleeding in the esophagus or stomach. You may suddenly vomit bright red blood. Or, the blood may look like coffee grounds. You may also have other symptoms, such as:  Stomach pain.  Heartburn.  Stool (feces) that looks black and tarry. Follow these instructions at home:   Take over-the-counter and prescription medicines only as told by your health care provider.  Do not take NSAIDs, including aspirin and ibuprofen, unless your health care provider approves. These medicines can increase bleeding.  Rest as needed.  Drink enough fluids to keep your urine pale yellow. Take small sips of fluid at a time.  Do not drink alcohol.  Do not use any products that contain nicotine or tobacco, such as cigarettes and e-cigarettes. If you need help quitting, ask your health care provider.  Keep all follow-up visits as told by your health care provider. This is important. Contact a health care provider if:  You have more blood in your vomit.  Your vomiting of blood begins again after it has stopped.  You have persistent stomach pain.  You have nausea, indigestion, or heartburn. Get help right away if:  You faint.  You feel weak or dizzy.  You are urinating less than normal or not at all.  You vomit up: ? Large amounts of blood or dark material that may look like coffee grounds. ? Bright red blood.  You have any of the following: ? Persistent vomiting. ? A rapid heartbeat. ? Blood in your stool. ? Chest pain. ? Difficulty breathing. These symptoms may represent a serious problem that is an emergency. Do not wait to see if the symptoms will go away. Get medical help right away. Call your local emergency services  (911 in the [place]). Do not drive yourself to the hospital. Summary  Hematemesis is when you vomit blood. It is a sign of bleeding in the upper GI tract (gastrointestinal tract).  Hematemesis is usually caused by bleeding in the esophagus or stomach.  Do not take NSAIDs (including aspirin and ibuprofen), drink alcohol, or use tobacco products.  Take over-the-counter and prescription medicines only as told by your health care provider. This information is not intended to replace advice given to you by your health care provider. Make sure you discuss any questions you have with your health care provider. Document Revised: 06/06/2017 Document Reviewed: 06/06/2017 Elsevier Patient Education  2020 Elsevier Inc.  

## 2020-01-11 NOTE — Progress Notes (Signed)
Subjective:  Patient ID: Bridget Grant, female    DOB: 1945-09-05  Age: 74 y.o. MRN: [id number]  CC: GI Problem  This visit occurred during the SARS-CoV-2 public health emergency.  Safety protocols were in place, including screening questions prior to the visit, additional usage of staff PPE, and extensive cleaning of exam room while observing appropriate contact time as indicated for disinfecting solutions.   NEW TO ME  HPI EVETTE DICLEMENTE presents for concerns about hematemesis - She ate a pizza about 3 days ago and soon after that she started vomiting and ended up vomiting a significant amount of blood.  She has also noticed melena.  She has had a few episodes of diarrhea but she denies abdominal pain, fever, or chills.  She complains of fatigue and weakness but she denies shortness of breath, dizziness, or lightheadedness.  Outpatient Medications Prior to Visit  Medication Sig Dispense Refill  . atorvastatin (LIPITOR) 20 MG tablet TAKE 1 TABLET BY MOUTH EVERY DAY 90 tablet 3  . diltiazem (CARDIZEM CD) 240 MG 24 hr capsule TAKE 1 CAPSULE(240 MG) BY MOUTH DAILY 90 capsule 3  . glipiZIDE (GLUCOTROL XL) 10 MG 24 hr tablet TAKE 1 TABLET BY MOUTH DAILY WITH BREAKFAST 90 tablet 0  . hydrochlorothiazide (MICROZIDE) 12.5 MG capsule Take 1 capsule (12.5 mg total) by mouth daily. 90 capsule 3  . lisinopril (ZESTRIL) 20 MG tablet TAKE 1 TABLET BY MOUTH EVERY DAY 90 tablet 3  . metFORMIN (GLUCOPHAGE-XR) 500 MG 24 hr tablet TAKE 4 TABLETS(2000 MG) BY MOUTH DAILY WITH BREAKFAST 360 tablet 1  . pioglitazone (ACTOS) 15 MG tablet Take 1 tablet (15 mg total) by mouth daily. 90 tablet 3  . vitamin B-12 (CYANOCOBALAMIN) 1000 MCG tablet Take 1 tablet (1,000 mcg total) by mouth daily. 90 tablet 3  . Vitamin D, Ergocalciferol, (DRISDOL) 1.25 MG (50000 UNIT) CAPS capsule Take 1 capsule (50,000 Units total) by mouth every 7 (seven) days. 12 capsule 0  . aspirin EC 81 MG tablet Take 81 mg by mouth daily.     No  facility-administered medications prior to visit.    ROS Review of Systems  Constitutional: Positive for fatigue. Negative for chills and diaphoresis.  HENT: Negative.   Respiratory: Negative for cough, chest tightness, shortness of breath and wheezing.   Cardiovascular: Negative for chest pain, palpitations and leg swelling.  Gastrointestinal: Positive for blood in stool, diarrhea and vomiting. Negative for abdominal pain, anal bleeding, nausea and rectal pain.  Endocrine: Negative.   Genitourinary: Negative.  Negative for difficulty urinating.  Musculoskeletal: Negative.  Negative for arthralgias and myalgias.  Skin: Positive for pallor.  Neurological: Negative.  Negative for dizziness, weakness, light-headedness and headaches.  Hematological: Negative for adenopathy. Does not bruise/bleed easily.  Psychiatric/Behavioral: Negative.     Objective:  BP 132/62 (BP Location: Left Arm, Patient Position: Sitting, Cuff Size: Large)   Pulse 90   Temp 98.6 F (37 C) (Oral)   Resp 16   Ht 5\' 2"  (1.575 m)   Wt 205 lb 4 oz (93.1 kg)   SpO2 96%   BMI 37.54 kg/m   BP Readings from Last 3 Encounters:  01/12/20 (!) 119/58  01/11/20 132/62  11/16/19 126/80    Wt Readings from Last 3 Encounters:  01/11/20 205 lb 4 oz (93.1 kg)  11/16/19 205 lb (93 kg)  11/16/19 205 lb (93 kg)    Physical Exam Vitals reviewed. Exam conducted with a chaperone present (Stefannie).  Constitutional:  General: She is not in acute distress.    Appearance: She is obese. She is not ill-appearing, toxic-appearing or diaphoretic.  HENT:     Nose: Nose normal.     Mouth/Throat:     Mouth: Mucous membranes are moist.  Eyes:     General: No scleral icterus.    Conjunctiva/sclera: Conjunctivae normal.  Cardiovascular:     Rate and Rhythm: Normal rate and regular rhythm.     Heart sounds: No murmur heard.   Pulmonary:     Effort: Pulmonary effort is normal.     Breath sounds: No stridor. No wheezing,  rhonchi or rales.  Abdominal:     General: Abdomen is protuberant. Bowel sounds are normal. There is no distension.     Palpations: There is no hepatomegaly, splenomegaly or mass.     Tenderness: There is no abdominal tenderness. There is no guarding.  Genitourinary:    Rectum: Guaiac result positive. No mass, tenderness, anal fissure, external hemorrhoid or internal hemorrhoid. Normal anal tone.     Comments: + melena, +polyp left lateral wall Musculoskeletal:     Cervical back: Neck supple.  Lymphadenopathy:     Cervical: No cervical adenopathy.  Skin:    General: Skin is warm.  Neurological:     General: No focal deficit present.     Mental Status: She is alert.     Lab Results  Component Value Date   WBC 15.4 (H) 01/11/2020   HGB 9.2 (L) 01/11/2020   HCT 28.1 (L) 01/11/2020   PLT 258 01/11/2020   GLUCOSE 121 (H) 01/11/2020   CHOL 142 11/16/2019   TRIG 192.0 (H) 11/16/2019   HDL 41.90 11/16/2019   LDLDIRECT 85.0 10/16/2016   LDLCALC 62 11/16/2019   ALT 15 01/11/2020   AST 17 01/11/2020   NA 141 01/11/2020   K 4.1 01/11/2020   CL 104 01/11/2020   CREATININE 0.81 01/11/2020   BUN 35 (H) 01/11/2020   CO2 23 01/11/2020   TSH 2.21 11/16/2019   INR 0.9 01/11/2020   HGBA1C 8.6 (H) 11/16/2019   MICROALBUR 1.1 11/16/2019    DG Bone Density  Result Date: 10/24/2016 Date of study: 10/23/2016 Exam: DUAL X-RAY ABSORPTIOMETRY (DXA) FOR BONE MINERAL DENSITY (BMD) Instrument: [employer] Requesting Provider: PCP Indication: follow up for low BMD Comparison: 12/13/2009 Clinical data: Pt is a 74 y.o. female with previous history of toe fracture. Results:  Lumbar spine (L1-L4) Femoral neck (FN) T-score 0.0  RFN:-1.1 LFN:-1.0  Change in BMD from previous DXA test (%) n/a +0.7%  (*) statistically significant Assessment: the BMD is low according to the [facility] classification for osteoporosis (see below). Fracture risk: moderate FRAX score: 10 year major osteoporotic risk: 12.3 %. 10  year hip fracture risk: 1.2 %. These are under the thresholds for treatment of 20% and 3%, respectively. Comments: the technical quality of the study is good. Evaluation for secondary causes should be considered if clinically indicated. Recommend optimizing calcium (1200 mg/day) and vitamin D (800 IU/day) intake. Followup: Repeat BMD is appropriate after 2 years. WHO criteria for diagnosis of osteoporosis in postmenopausal women and in men 36 y/o or older: - normal: T-score -1.0 to + 1.0 - osteopenia/low bone density: T-score between -2.5 and -1.0 - osteoporosis: T-score below -2.5 - severe osteoporosis: T-score below -2.5 with history of fragility fracture Note: although not part of the WHO classification, the presence of a fragility fracture, regardless of the T-score, should be considered diagnostic of osteoporosis, provided other  causes for the fracture have been excluded. Treatment: The National Osteoporosis Foundation recommends that treatment be considered in postmenopausal women and men age 54 or older with: 1. Hip or vertebral (clinical or morphometric) fracture 2. T-score of - 2.5 or lower at the spine or hip 3. 10-year fracture probability by FRAX of at least 20% for a major osteoporotic fracture and 3% for a hip fracture Philemon Kingdom, MD [facility]    Assessment & Plan:   Caylen was seen today for gi problem.  Diagnoses and all orders for this visit:  Hematemesis without nausea- Based on her symptoms, exam, and labs it looks like she has an upper GI bleed.  Her only risk factor is that she takes a baby aspirin every day.  Her hemoglobin was 14.2 about a month ago and today it is down to 9.2 so I think she has lost a significant amount of blood.  I have asked her to stop taking the asa and present to the emergency department for urgent evaluation and treatment options.  She may need a transfusion and upper endoscopy. -     CBC with Differential/Platelet; Future -     Hepatic  function panel; Future -     BASIC METABOLIC PANEL WITH GFR; Future -     Protime-INR; Future -     APTT; Future -     APTT -     Protime-INR -     BASIC METABOLIC PANEL WITH GFR -     Hepatic function panel -     CBC with Differential/Platelet  Melena- See sbove -     CBC with Differential/Platelet; Future -     Hepatic function panel; Future -     BASIC METABOLIC PANEL WITH GFR; Future -     Protime-INR; Future -     APTT; Future -     APTT -     Protime-INR -     BASIC METABOLIC PANEL WITH GFR -     Hepatic function panel -     CBC with Differential/Platelet   I have discontinued Ronniesha Seibold. Laban's aspirin EC. I am also having her maintain her hydrochlorothiazide, glipiZIDE, atorvastatin, lisinopril, vitamin B-12, pioglitazone, Vitamin D (Ergocalciferol), diltiazem, and metFORMIN.  No orders of the defined types were placed in this encounter.    Follow-up: Return in about 1 week (around 01/18/2020).  Scarlette Calico, MD

## 2020-01-12 ENCOUNTER — Inpatient Hospital Stay (HOSPITAL_COMMUNITY)
Admission: EM | Admit: 2020-01-12 | Discharge: 2020-01-16 | DRG: 375 | Disposition: A | Discharge status: Home / Self Care | Payer: Medicare Other | Attending: Family Medicine | Admitting: Family Medicine

## 2020-01-12 ENCOUNTER — Encounter: Payer: Self-pay | Admitting: Internal Medicine

## 2020-01-12 ENCOUNTER — Other Ambulatory Visit: Payer: Self-pay

## 2020-01-12 DIAGNOSIS — M8580 Other specified disorders of bone density and structure, unspecified site: Secondary | ICD-10-CM | POA: Diagnosis not present

## 2020-01-12 DIAGNOSIS — Z7982 Long term (current) use of aspirin: Secondary | ICD-10-CM | POA: Diagnosis not present

## 2020-01-12 DIAGNOSIS — K922 Gastrointestinal hemorrhage, unspecified: Secondary | ICD-10-CM | POA: Diagnosis present

## 2020-01-12 DIAGNOSIS — I10 Essential (primary) hypertension: Secondary | ICD-10-CM | POA: Diagnosis present

## 2020-01-12 DIAGNOSIS — D539 Nutritional anemia, unspecified: Secondary | ICD-10-CM | POA: Diagnosis present

## 2020-01-12 DIAGNOSIS — F418 Other specified anxiety disorders: Secondary | ICD-10-CM | POA: Diagnosis not present

## 2020-01-12 DIAGNOSIS — E538 Deficiency of other specified B group vitamins: Secondary | ICD-10-CM | POA: Diagnosis present

## 2020-01-12 DIAGNOSIS — Z7984 Long term (current) use of oral hypoglycemic drugs: Secondary | ICD-10-CM

## 2020-01-12 DIAGNOSIS — Z803 Family history of malignant neoplasm of breast: Secondary | ICD-10-CM

## 2020-01-12 DIAGNOSIS — C49A2 Gastrointestinal stromal tumor of stomach: Principal | ICD-10-CM | POA: Diagnosis present

## 2020-01-12 DIAGNOSIS — E785 Hyperlipidemia, unspecified: Secondary | ICD-10-CM | POA: Diagnosis not present

## 2020-01-12 DIAGNOSIS — K2970 Gastritis, unspecified, without bleeding: Secondary | ICD-10-CM | POA: Diagnosis not present

## 2020-01-12 DIAGNOSIS — K921 Melena: Secondary | ICD-10-CM | POA: Diagnosis present

## 2020-01-12 DIAGNOSIS — E119 Type 2 diabetes mellitus without complications: Secondary | ICD-10-CM | POA: Diagnosis not present

## 2020-01-12 DIAGNOSIS — K920 Hematemesis: Secondary | ICD-10-CM | POA: Diagnosis present

## 2020-01-12 DIAGNOSIS — Z8041 Family history of malignant neoplasm of ovary: Secondary | ICD-10-CM

## 2020-01-12 DIAGNOSIS — Z20822 Contact with and (suspected) exposure to covid-19: Secondary | ICD-10-CM | POA: Diagnosis not present

## 2020-01-12 DIAGNOSIS — C269 Malignant neoplasm of ill-defined sites within the digestive system: Secondary | ICD-10-CM | POA: Diagnosis not present

## 2020-01-12 DIAGNOSIS — D62 Acute posthemorrhagic anemia: Secondary | ICD-10-CM | POA: Diagnosis not present

## 2020-01-12 DIAGNOSIS — I959 Hypotension, unspecified: Secondary | ICD-10-CM | POA: Diagnosis not present

## 2020-01-12 DIAGNOSIS — Z79899 Other long term (current) drug therapy: Secondary | ICD-10-CM | POA: Diagnosis not present

## 2020-01-12 DIAGNOSIS — Z9049 Acquired absence of other specified parts of digestive tract: Secondary | ICD-10-CM

## 2020-01-12 DIAGNOSIS — N6320 Unspecified lump in the left breast, unspecified quadrant: Secondary | ICD-10-CM | POA: Diagnosis present

## 2020-01-12 DIAGNOSIS — K3189 Other diseases of stomach and duodenum: Secondary | ICD-10-CM | POA: Diagnosis not present

## 2020-01-12 DIAGNOSIS — D490 Neoplasm of unspecified behavior of digestive system: Secondary | ICD-10-CM | POA: Diagnosis not present

## 2020-01-12 DIAGNOSIS — Z8249 Family history of ischemic heart disease and other diseases of the circulatory system: Secondary | ICD-10-CM

## 2020-01-12 LAB — COMPREHENSIVE METABOLIC PANEL
ALT: 24 U/L (ref 0–44)
AST: 25 U/L (ref 15–41)
Albumin: 3.8 g/dL (ref 3.5–5.0)
Alkaline Phosphatase: 37 U/L — ABNORMAL LOW (ref 38–126)
Anion gap: 10 (ref 5–15)
BUN: 34 mg/dL — ABNORMAL HIGH (ref 8–23)
CO2: 26 mmol/L (ref 22–32)
Calcium: 9 mg/dL (ref 8.9–10.3)
Chloride: 104 mmol/L (ref 98–111)
Creatinine, Ser: 0.86 mg/dL (ref 0.44–1.00)
GFR calc Af Amer: >60 mL/min (ref >60)
GFR calc non Af Amer: >60 mL/min (ref >60)
Glucose, Bld: 138 mg/dL — ABNORMAL HIGH (ref 70–99)
Potassium: 4 mmol/L (ref 3.5–5.1)
Sodium: 140 mmol/L (ref 135–145)
Total Bilirubin: 0.7 mg/dL (ref 0.3–1.2)
Total Protein: 6.5 g/dL (ref 6.5–8.1)

## 2020-01-12 LAB — CBC WITH DIFFERENTIAL/PLATELET
Absolute Monocytes: 1109 cells/uL — ABNORMAL HIGH (ref 200–950)
Basophils Absolute: 46 cells/uL (ref 0–200)
Basophils Relative: 0.3 %
Eosinophils Absolute: 123 cells/uL (ref 15–500)
Eosinophils Relative: 0.8 %
HCT: 28.1 % — ABNORMAL LOW (ref 35.0–45.0)
Hemoglobin: 9.2 g/dL — ABNORMAL LOW (ref 11.7–15.5)
Lymphs Abs: 3049 cells/uL (ref 850–3900)
MCH: 32.3 pg (ref 27.0–33.0)
MCHC: 32.7 g/dL (ref 32.0–36.0)
MCV: 98.6 fL (ref 80.0–100.0)
MPV: 10.8 fL (ref 7.5–12.5)
Monocytes Relative: 7.2 %
Neutro Abs: 11073 cells/uL — ABNORMAL HIGH (ref 1500–7800)
Neutrophils Relative %: 71.9 %
Platelets: 258 10*3/uL (ref 140–400)
RBC: 2.85 10*6/uL — ABNORMAL LOW (ref 3.80–5.10)
RDW: 12.4 % (ref 11.0–15.0)
Total Lymphocyte: 19.8 %
WBC: 15.4 10*3/uL — ABNORMAL HIGH (ref 3.8–10.8)

## 2020-01-12 LAB — CBC
HCT: 25.4 % — ABNORMAL LOW (ref 36.0–46.0)
Hemoglobin: 7.9 g/dL — ABNORMAL LOW (ref 12.0–15.0)
MCH: 31.7 pg (ref 26.0–34.0)
MCHC: 31.1 g/dL (ref 30.0–36.0)
MCV: 102 fL — ABNORMAL HIGH (ref 80.0–100.0)
Platelets: 240 10*3/uL (ref 150–400)
RBC: 2.49 MIL/uL — ABNORMAL LOW (ref 3.87–5.11)
RDW: 13.2 % (ref 11.5–15.5)
WBC: 13.4 10*3/uL — ABNORMAL HIGH (ref 4.0–10.5)
nRBC: 0.4 % — ABNORMAL HIGH (ref 0.0–0.2)

## 2020-01-12 LAB — HEPATIC FUNCTION PANEL
AG Ratio: 1.7 (calc) (ref 1.0–2.5)
ALT: 15 U/L (ref 6–29)
AST: 17 U/L (ref 10–35)
Albumin: 4.2 g/dL (ref 3.6–5.1)
Alkaline phosphatase (APISO): 40 U/L (ref 37–153)
Bilirubin, Direct: 0.1 mg/dL (ref 0.0–0.2)
Globulin: 2.5 g/dL (calc) (ref 1.9–3.7)
Indirect Bilirubin: 0.3 mg/dL (calc) (ref 0.2–1.2)
Total Bilirubin: 0.4 mg/dL (ref 0.2–1.2)
Total Protein: 6.7 g/dL (ref 6.1–8.1)

## 2020-01-12 LAB — BASIC METABOLIC PANEL WITH GFR
BUN/Creatinine Ratio: 43 (calc) — ABNORMAL HIGH (ref 6–22)
BUN: 35 mg/dL — ABNORMAL HIGH (ref 7–25)
CO2: 23 mmol/L (ref 20–32)
Calcium: 9.7 mg/dL (ref 8.6–10.4)
Chloride: 104 mmol/L (ref 98–110)
Creat: 0.81 mg/dL (ref 0.60–0.93)
GFR, Est African American: 84 mL/min/{1.73_m2} (ref >=60)
GFR, Est Non African American: 73 mL/min/{1.73_m2} (ref >=60)
Glucose, Bld: 121 mg/dL — ABNORMAL HIGH (ref 65–99)
Potassium: 4.1 mmol/L (ref 3.5–5.3)
Sodium: 141 mmol/L (ref 135–146)

## 2020-01-12 LAB — APTT: aPTT: 22 s — ABNORMAL LOW (ref 23–32)

## 2020-01-12 LAB — PROTIME-INR
INR: 0.9
Prothrombin Time: 10.1 s (ref 9.0–11.5)

## 2020-01-12 NOTE — Telephone Encounter (Signed)
    Please call to discuss lab results. Call home number

## 2020-01-12 NOTE — ED Triage Notes (Signed)
Pt reports dark bloody diarrhea since Sunday and two episodes of hematemesis. Then went to her PCP for eval of same. Received call today that her hemoglobin is low. Endorses generalized weakness.

## 2020-01-13 ENCOUNTER — Encounter (HOSPITAL_COMMUNITY): Payer: Self-pay | Admitting: Internal Medicine

## 2020-01-13 ENCOUNTER — Other Ambulatory Visit: Payer: Self-pay

## 2020-01-13 ENCOUNTER — Observation Stay (HOSPITAL_COMMUNITY): Payer: Medicare Other

## 2020-01-13 ENCOUNTER — Encounter (HOSPITAL_COMMUNITY)
Admission: EM | Disposition: A | Discharge status: Home / Self Care | Payer: Self-pay | Source: Home / Self Care | Attending: Family Medicine

## 2020-01-13 ENCOUNTER — Observation Stay (HOSPITAL_COMMUNITY): Payer: Medicare Other | Admitting: Anesthesiology

## 2020-01-13 DIAGNOSIS — K922 Gastrointestinal hemorrhage, unspecified: Secondary | ICD-10-CM

## 2020-01-13 DIAGNOSIS — D62 Acute posthemorrhagic anemia: Secondary | ICD-10-CM | POA: Diagnosis not present

## 2020-01-13 DIAGNOSIS — Z20822 Contact with and (suspected) exposure to covid-19: Secondary | ICD-10-CM | POA: Diagnosis not present

## 2020-01-13 DIAGNOSIS — K921 Melena: Secondary | ICD-10-CM | POA: Diagnosis not present

## 2020-01-13 DIAGNOSIS — M8580 Other specified disorders of bone density and structure, unspecified site: Secondary | ICD-10-CM | POA: Diagnosis not present

## 2020-01-13 DIAGNOSIS — F418 Other specified anxiety disorders: Secondary | ICD-10-CM | POA: Diagnosis not present

## 2020-01-13 DIAGNOSIS — K2970 Gastritis, unspecified, without bleeding: Secondary | ICD-10-CM

## 2020-01-13 DIAGNOSIS — E119 Type 2 diabetes mellitus without complications: Secondary | ICD-10-CM | POA: Diagnosis not present

## 2020-01-13 DIAGNOSIS — D539 Nutritional anemia, unspecified: Secondary | ICD-10-CM | POA: Diagnosis not present

## 2020-01-13 DIAGNOSIS — K920 Hematemesis: Secondary | ICD-10-CM | POA: Diagnosis not present

## 2020-01-13 DIAGNOSIS — I959 Hypotension, unspecified: Secondary | ICD-10-CM | POA: Diagnosis not present

## 2020-01-13 DIAGNOSIS — C269 Malignant neoplasm of ill-defined sites within the digestive system: Secondary | ICD-10-CM | POA: Diagnosis not present

## 2020-01-13 DIAGNOSIS — I10 Essential (primary) hypertension: Secondary | ICD-10-CM | POA: Diagnosis not present

## 2020-01-13 DIAGNOSIS — K3189 Other diseases of stomach and duodenum: Secondary | ICD-10-CM | POA: Diagnosis not present

## 2020-01-13 DIAGNOSIS — E785 Hyperlipidemia, unspecified: Secondary | ICD-10-CM | POA: Diagnosis not present

## 2020-01-13 HISTORY — DX: Gastrointestinal hemorrhage, unspecified: K92.2

## 2020-01-13 HISTORY — PX: BIOPSY: SHX5522

## 2020-01-13 HISTORY — PX: ESOPHAGOGASTRODUODENOSCOPY (EGD) WITH PROPOFOL: SHX5813

## 2020-01-13 LAB — CBC WITH DIFFERENTIAL/PLATELET
Abs Immature Granulocytes: 0.17 10*3/uL — ABNORMAL HIGH (ref 0.00–0.07)
Basophils Absolute: 0 10*3/uL (ref 0.0–0.1)
Basophils Relative: 0 %
Eosinophils Absolute: 0.2 10*3/uL (ref 0.0–0.5)
Eosinophils Relative: 1 %
HCT: 25 % — ABNORMAL LOW (ref 36.0–46.0)
Hemoglobin: 7.9 g/dL — ABNORMAL LOW (ref 12.0–15.0)
Immature Granulocytes: 1 %
Lymphocytes Relative: 21 %
Lymphs Abs: 2.9 10*3/uL (ref 0.7–4.0)
MCH: 30.9 pg (ref 26.0–34.0)
MCHC: 31.6 g/dL (ref 30.0–36.0)
MCV: 97.7 fL (ref 80.0–100.0)
Monocytes Absolute: 1.2 10*3/uL — ABNORMAL HIGH (ref 0.1–1.0)
Monocytes Relative: 9 %
Neutro Abs: 9.5 10*3/uL — ABNORMAL HIGH (ref 1.7–7.7)
Neutrophils Relative %: 68 %
Platelets: 203 10*3/uL (ref 150–400)
RBC: 2.56 MIL/uL — ABNORMAL LOW (ref 3.87–5.11)
RDW: 15.6 % — ABNORMAL HIGH (ref 11.5–15.5)
WBC: 13.9 10*3/uL — ABNORMAL HIGH (ref 4.0–10.5)
nRBC: 0.6 % — ABNORMAL HIGH (ref 0.0–0.2)

## 2020-01-13 LAB — CBG MONITORING, ED: Glucose-Capillary: 158 mg/dL — ABNORMAL HIGH (ref 70–99)

## 2020-01-13 LAB — GLUCOSE, CAPILLARY
Glucose-Capillary: 145 mg/dL — ABNORMAL HIGH (ref 70–99)
Glucose-Capillary: 109 mg/dL — ABNORMAL HIGH (ref 70–99)
Glucose-Capillary: 123 mg/dL — ABNORMAL HIGH (ref 70–99)

## 2020-01-13 LAB — SARS CORONAVIRUS 2 BY RT PCR (HOSPITAL ORDER, PERFORMED IN ~~LOC~~ HOSPITAL LAB): SARS Coronavirus 2: NEGATIVE

## 2020-01-13 LAB — PREPARE RBC (CROSSMATCH)

## 2020-01-13 LAB — ABO/RH: ABO/RH(D): O POS

## 2020-01-13 IMAGING — CT CT CHEST W/ CM
3 of 5 series · 13 of 36 positions shown, 16 images · IV contrast (Omni 300)
Comparison: None.

CLINICAL DATA: Staging of gastrointestinal cancer.

EXAM:
CT CHEST, ABDOMEN, AND PELVIS WITH CONTRAST
TECHNIQUE: Multidetector CT imaging of the chest, abdomen and pelvis was
performed following the standard protocol during bolus
administration of intravenous contrast.
CONTRAST:  100mL OMNIPAQUE IOHEXOL 350 MG/ML SOLN

[Series 3: cap with 5mm st · axial · 0.89mm/px · z∈[+1011,+1506]mm · 9 of 125 slices shown, 12 images]
[im 13/125  mediastinal]
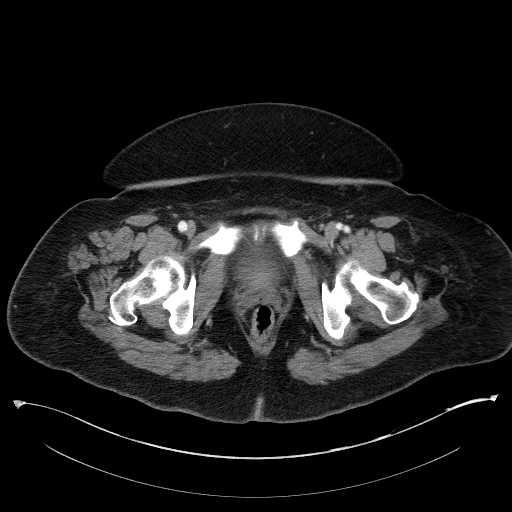
[im 13/125  lung]
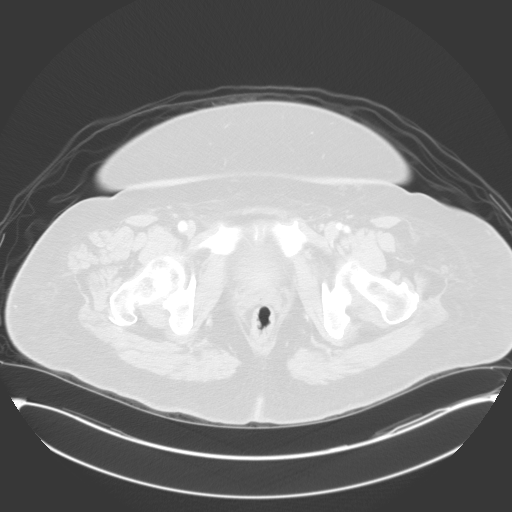
[im 25/125  lung]
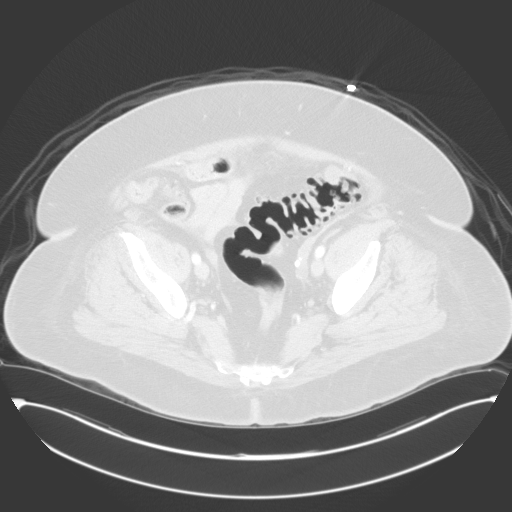
[im 38/125  lung]
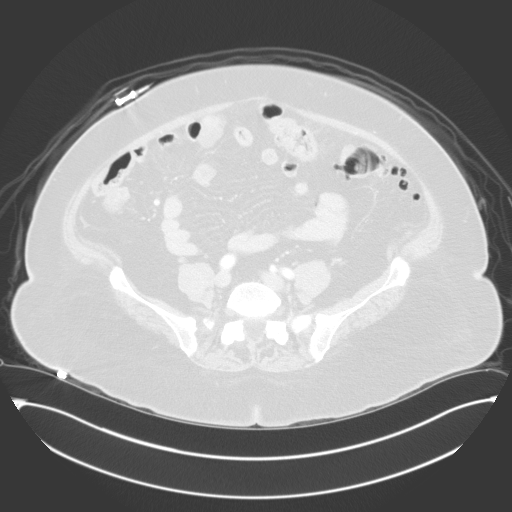
[im 50/125  lung]
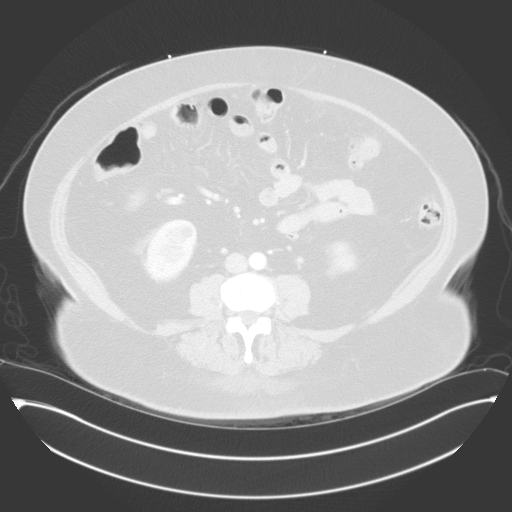
[im 63/125  mediastinal]
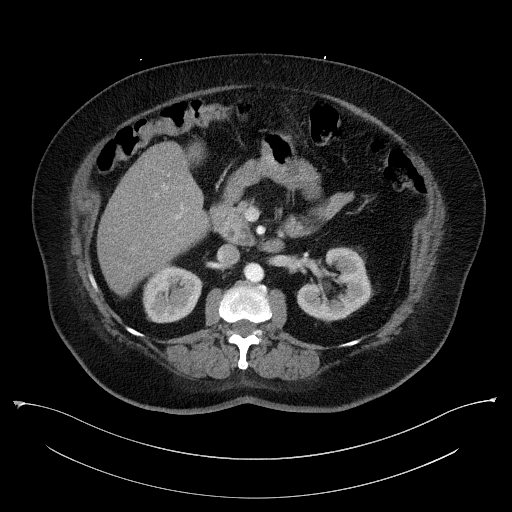
[im 63/125  lung]
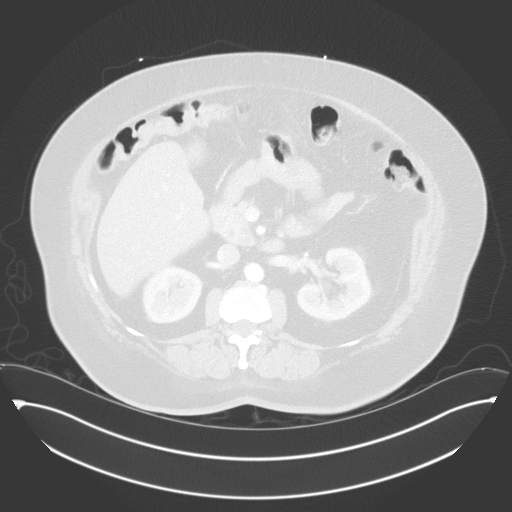
[im 75/125  lung]
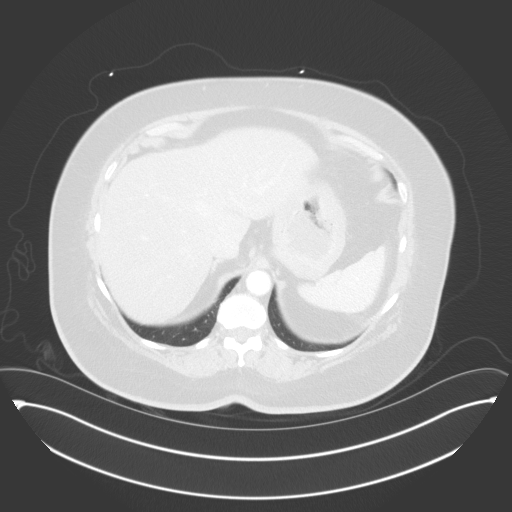
[im 87/125  lung]
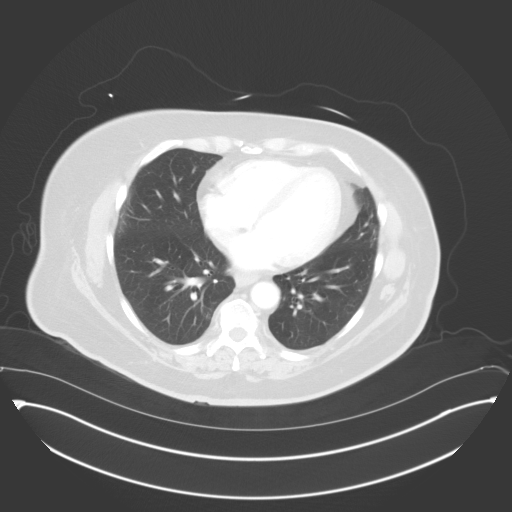
[im 100/125  lung]
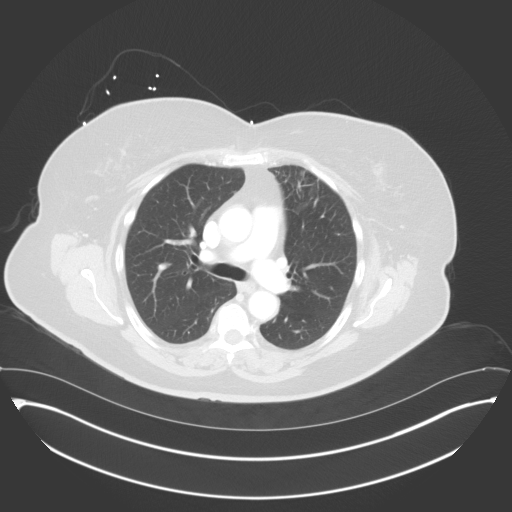
[im 112/125  mediastinal]
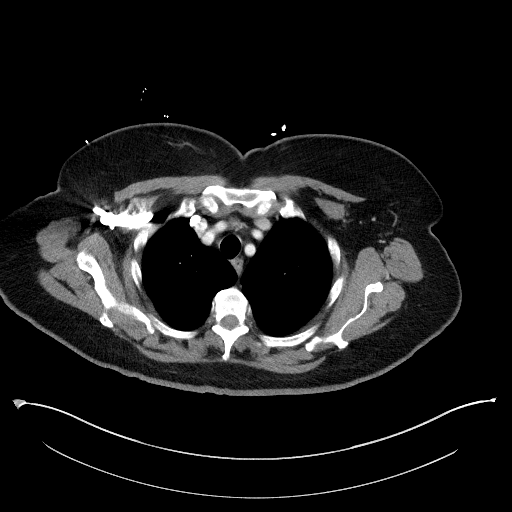
[im 112/125  lung]
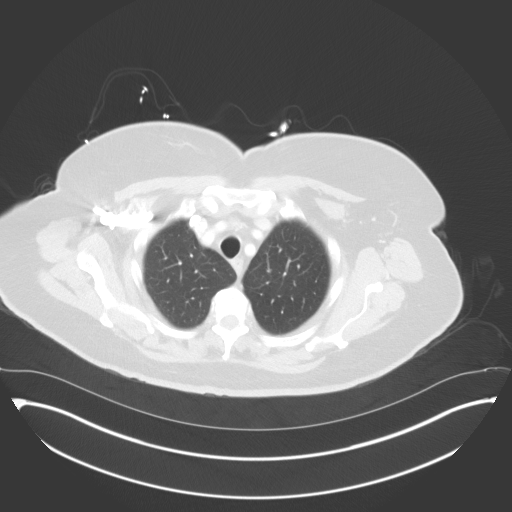

[Series 5: lung · axial · 0.87mm/px · 1 of 145 slices shown]
[im 13/145  lung]
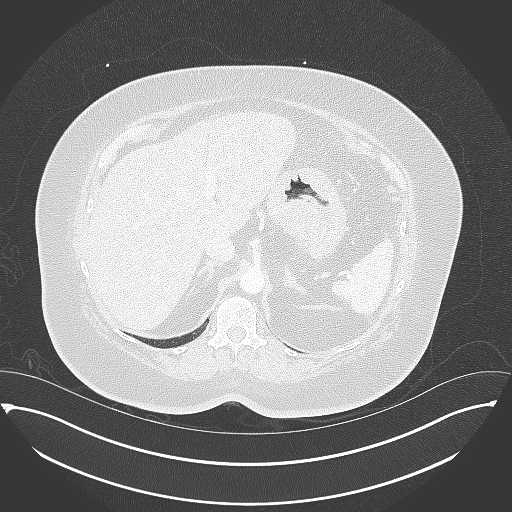

[Series 7: cap with 3mm st cor · coronal · 0.89mm/px · 3 of 184 slices shown]
[im 37/184  lung]
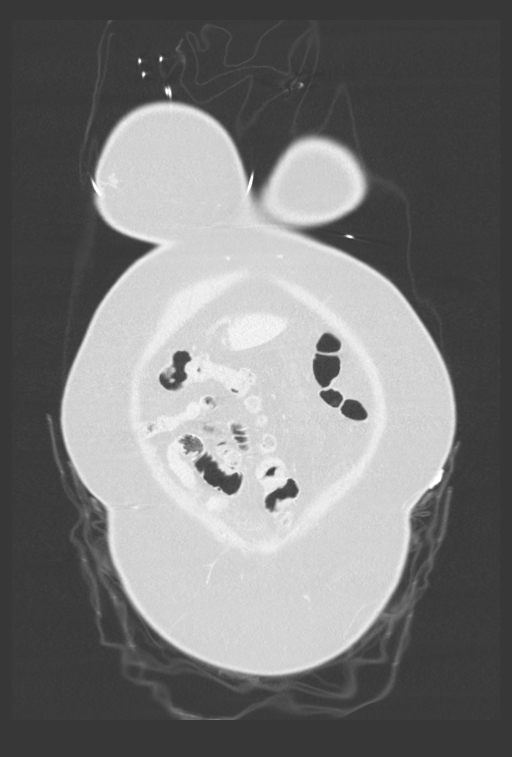
[im 74/184  lung]
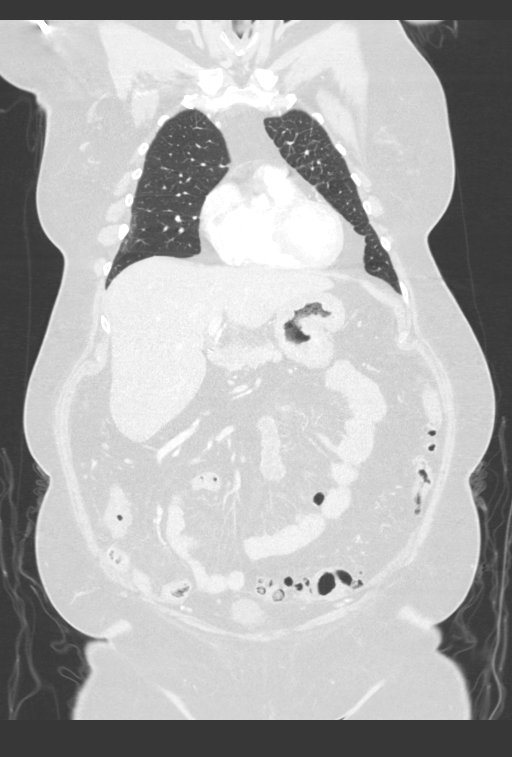
[im 110/184  lung]
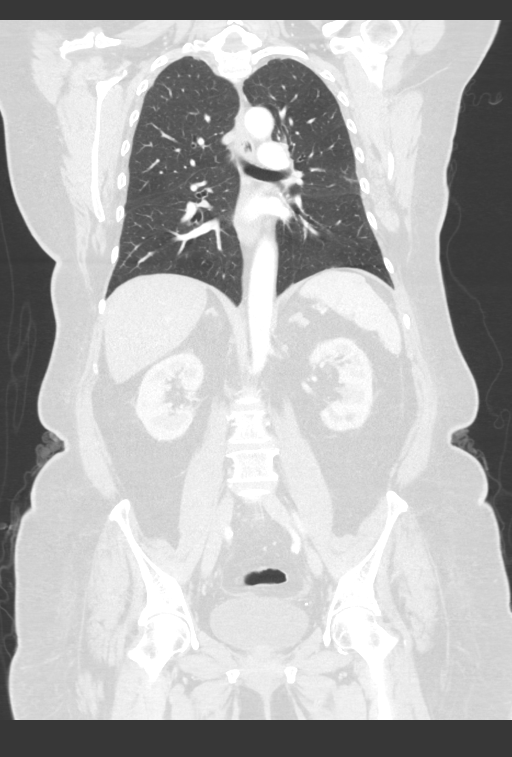

[13 of 36 positions shown; findings below may reference images not displayed]

FINDINGS: CT CHEST FINDINGS

Cardiovascular: There is moderate severity calcification of the
aortic arch. Normal heart size. No pericardial effusion.

Mediastinum/Nodes: No enlarged mediastinal, hilar, or axillary lymph
nodes. The thyroid gland and trachea demonstrate no significant
findings. A small hiatal hernia is seen.

Lungs/Pleura: Lungs are clear. No pleural effusion or pneumothorax.

Musculoskeletal: A 2.1 cm x 1.1 cm well-defined area of low
attenuation is seen within the soft tissues of the left breast
(axial CT image 28, CT series number 3).

A 6.5 mm sclerotic focus is seen within the posterior aspect of the
T7 vertebral body.

CT ABDOMEN PELVIS FINDINGS

Hepatobiliary: No focal liver abnormality is seen. Status post
cholecystectomy. No biliary dilatation.

Pancreas: Unremarkable. No pancreatic ductal dilatation or
surrounding inflammatory changes.

Spleen: Normal in size without focal abnormality.

Adrenals/Urinary Tract: Adrenal glands are unremarkable. Kidneys are
normal in size, without renal calculi or hydronephrosis. A 1.1 cm
cyst is seen along the anteromedial aspect of the lower pole of the
right kidney. Bladder is unremarkable.

Stomach/Bowel: There is a small hiatal hernia. The appendix is not
identified. No evidence of bowel wall thickening, distention, or
inflammatory changes.

Vascular/Lymphatic: There is mild calcification of the abdominal
aorta and bilateral common iliac arteries, without evidence of
aneurysmal dilatation or dissection. No enlarged abdominal or pelvic
lymph nodes.

Reproductive: Status post hysterectomy. No adnexal masses.

Other: There is a 2.6 cm x 2.0 cm fat containing umbilical hernia.

No abdominopelvic ascites.

Musculoskeletal: No acute or significant osseous findings.
IMPRESSION: 1. 2.1 cm x 1.1 cm well-defined area of low attenuation within the
soft tissues of the left breast. This may represent a lymph node,
however, correlation with mammography is recommended.
2. 6.5 mm sclerotic focus within the posterior aspect of the T7
vertebral body. This may represent a bone island, however,
correlation with follow-up abdomen pelvis CT is recommended to
exclude the presence of a metastatic lesion.
3. Small hiatal hernia.
4. 2.6 cm x 2.0 cm fat containing umbilical hernia.
5. Aortic atherosclerosis.

Aortic Atherosclerosis ([FH]-[FH]).

## 2020-01-13 SURGERY — ESOPHAGOGASTRODUODENOSCOPY (EGD) WITH PROPOFOL
Anesthesia: Monitor Anesthesia Care

## 2020-01-13 MED ORDER — INSULIN ASPART 100 UNIT/ML ~~LOC~~ SOLN
0.0000 [IU] | SUBCUTANEOUS | Status: DC
  Administered 2020-01-13: 2 [IU] via SUBCUTANEOUS

## 2020-01-13 MED ORDER — PANTOPRAZOLE SODIUM 40 MG IV SOLR: 40.0000 mg | Freq: Two times a day (BID) | INTRAVENOUS | Status: DC

## 2020-01-13 MED ORDER — PROPOFOL 10 MG/ML IV BOLUS
INTRAVENOUS | Status: DC | PRN
  Administered 2020-01-13: 50 mg via INTRAVENOUS
  Administered 2020-01-13: 100 mg via INTRAVENOUS
  Administered 2020-01-13: 70 mg via INTRAVENOUS
  Administered 2020-01-13: 80 mg via INTRAVENOUS

## 2020-01-13 MED ORDER — ONDANSETRON HCL 4 MG/2ML IJ SOLN: 4.0000 mg | Freq: Four times a day (QID) | INTRAMUSCULAR | Status: DC | PRN

## 2020-01-13 MED ORDER — SODIUM CHLORIDE 0.9 % IV BOLUS
1000.0000 mL | Freq: Once | INTRAVENOUS | Status: AC
  Administered 2020-01-13: 1000 mL via INTRAVENOUS

## 2020-01-13 MED ORDER — SODIUM CHLORIDE 0.9 % IV SOLN
80.0000 mg | Freq: Once | INTRAVENOUS | Status: AC
  Administered 2020-01-13: 80 mg via INTRAVENOUS
  Filled 2020-01-13: qty 80

## 2020-01-13 MED ORDER — SODIUM CHLORIDE 0.9 % IV SOLN: INTRAVENOUS | Status: DC | PRN

## 2020-01-13 MED ORDER — IOHEXOL 350 MG/ML SOLN
100.0000 mL | Freq: Once | INTRAVENOUS | Status: AC | PRN
  Administered 2020-01-13: 100 mL via INTRAVENOUS

## 2020-01-13 MED ORDER — ACETAMINOPHEN 650 MG RE SUPP: 650.0000 mg | Freq: Four times a day (QID) | RECTAL | Status: DC | PRN

## 2020-01-13 MED ORDER — INSULIN ASPART 100 UNIT/ML ~~LOC~~ SOLN
0.0000 [IU] | Freq: Three times a day (TID) | SUBCUTANEOUS | Status: DC
  Administered 2020-01-13 – 2020-01-15 (×4): 1 [IU] via SUBCUTANEOUS
  Administered 2020-01-15: 2 [IU] via SUBCUTANEOUS
  Administered 2020-01-15 – 2020-01-16 (×4): 1 [IU] via SUBCUTANEOUS

## 2020-01-13 MED ORDER — SODIUM CHLORIDE 0.9% IV SOLUTION: Freq: Once | INTRAVENOUS | Status: AC

## 2020-01-13 MED ORDER — SODIUM CHLORIDE 0.9 % IV SOLN: INTRAVENOUS | Status: AC

## 2020-01-13 MED ORDER — ACETAMINOPHEN 325 MG PO TABS: 650.0000 mg | ORAL_TABLET | Freq: Four times a day (QID) | ORAL | Status: DC | PRN

## 2020-01-13 MED ORDER — PANTOPRAZOLE SODIUM 40 MG IV SOLR
40.0000 mg | Freq: Two times a day (BID) | INTRAVENOUS | Status: DC
  Administered 2020-01-13 – 2020-01-15 (×4): 40 mg via INTRAVENOUS
  Filled 2020-01-13 (×4): qty 40

## 2020-01-13 MED ORDER — SODIUM CHLORIDE 0.9 % IV SOLN
8.0000 mg/h | INTRAVENOUS | Status: DC
  Administered 2020-01-13 (×2): 8 mg/h via INTRAVENOUS
  Filled 2020-01-13: qty 80

## 2020-01-13 MED ORDER — LIDOCAINE 2% (20 MG/ML) 5 ML SYRINGE
INTRAMUSCULAR | Status: DC | PRN
  Administered 2020-01-13: 60 mg via INTRAVENOUS

## 2020-01-13 SURGICAL SUPPLY — 14 items

## 2020-01-13 NOTE — Progress Notes (Signed)
74 year old pleasant lady who was admitted early morning under hospital service due to an episode of hematemesis and melena.  Her hemoglobin had dropped significantly for about 5 points in last 1 month but was still over 9.  1 unit of PRBC transfusion was ordered.  Patient seen and examined in the ED.  She has not had any further episodes of hematemesis and melena since admission.  She had no other complaint.  On examination, abdomen is soft and nontender.  GI has seen patient.  She is going to have EGD today.  Hemodynamically stable now.  Had hypotension last night..  Repeating her hemoglobin now.  She was started on PPI drip which has been stopped now.  Will defer to GI about that.

## 2020-01-13 NOTE — Transfer of Care (Signed)
Immediate Anesthesia Transfer of Care Note  Patient: Bridget Grant  Procedure(s) Performed: ESOPHAGOGASTRODUODENOSCOPY (EGD) WITH PROPOFOL (N/A ) BIOPSY  Patient Location: Endoscopy Unit  Anesthesia Type:MAC  Level of Consciousness: awake and patient cooperative  Airway & Oxygen Therapy: Patient Spontanous Breathing and Patient connected to nasal cannula oxygen  Post-op Assessment: Report given to RN and Post -op Vital signs reviewed and stable  Post vital signs: Reviewed and stable  Last Vitals:  Vitals Value Taken Time  BP 118/61 01/13/20 1438  Temp    Pulse 79 01/13/20 1438  Resp 23 01/13/20 1438  SpO2 100 % 01/13/20 1438  Vitals shown include unvalidated device data.  Last Pain:  Vitals:   01/13/20 1356  TempSrc: Oral  PainSc: 0-No pain         Complications: No complications documented.

## 2020-01-13 NOTE — H&P (Addendum)
History and Physical    Bridget Grant VXB:[id number] DOB: [birth year] DOA: 01/12/2020  PCP: [name], MD Patient coming from: Home  Chief Complaint: Hematemesis, melena  HPI: Bridget Grant is a 74 y.o. female with medical history significant of hypertension, hyperlipidemia, non-insulin-dependent type 2 diabetes, anxiety, depression presenting with complaints of hematemesis and melena.  She was sent to the ED by her PCP as lab work revealed hemoglobin of 9.2, down from 14.2 a month ago.  Patient states 3 days ago she had pizza and soon after vomited blood.  She then had another episode of vomiting blood at the same day.  Since then the vomiting has resolved but she has had dark tarry loose stools.  Reports having generalized weakness.  Denies abdominal pain.  Denies prior history of GI bleed.  She takes a baby aspirin daily but is not on any other blood thinners.  Denies recent NSAID use.  Denies alcohol use.  Patient has no other complaints.  She has been vaccinated against Covid.  Denies cough or shortness of breath.  Denies chest pain or dizziness.  ED Course: Hemodynamically stable on arrival.  WBC count mildly elevated at 13.4 and improved compared to labs done 2 days ago.  Hemoglobin 7.9.  BUN 34, creatinine 0.8.  LFTs normal.  FOBT pending.  SARS-CoV-2 PCR test pending.  IV PPI bolus and infusion ordered.  1 L fluid bolus ordered.  ED provider has sent a secure chat message to Dr. Watt Climes requesting GI consultation in the morning.  Review of Systems:  All systems reviewed and apart from history of presenting illness, are negative.  Past Medical History:  Diagnosis Date  . Anxiety   . Depression   . Hyperlipidemia   . Hypertension   . Migraine   . Osteopenia   . Type II or unspecified type diabetes mellitus without mention of complication, uncontrolled 04/09/2013    Past Surgical History:  Procedure Laterality Date  . ABDOMINAL HYSTERECTOMY       reports that she has never  smoked. She has never used smokeless tobacco. She reports that she does not drink alcohol and does not use drugs.  No Known Allergies  Family History  Problem Relation Age of Onset  . Cancer Mother   . Hypertension Father     Prior to Admission medications   Medication Sig Start Date End Date Taking? Authorizing Provider  atorvastatin (LIPITOR) 20 MG tablet TAKE 1 TABLET BY MOUTH EVERY DAY 11/09/19   [name], MD  diltiazem (CARDIZEM CD) 240 MG 24 hr capsule TAKE 1 CAPSULE(240 MG) BY MOUTH DAILY 12/06/19   [name], MD  glipiZIDE (GLUCOTROL XL) 10 MG 24 hr tablet TAKE 1 TABLET BY MOUTH DAILY WITH BREAKFAST 11/09/19   [name], MD  hydrochlorothiazide (MICROZIDE) 12.5 MG capsule Take 1 capsule (12.5 mg total) by mouth daily. 04/14/15   [name], MD  lisinopril (ZESTRIL) 20 MG tablet TAKE 1 TABLET BY MOUTH EVERY DAY 11/09/19   [name], MD  metFORMIN (GLUCOPHAGE-XR) 500 MG 24 hr tablet TAKE 4 TABLETS(2000 MG) BY MOUTH DAILY WITH BREAKFAST 01/05/20   [name], MD  pioglitazone (ACTOS) 15 MG tablet Take 1 tablet (15 mg total) by mouth daily. 11/17/19   [name], MD  vitamin B-12 (CYANOCOBALAMIN) 1000 MCG tablet Take 1 tablet (1,000 mcg total) by mouth daily. 11/16/19   [name], MD  Vitamin D, Ergocalciferol, (DRISDOL) 1.25 MG (50000 UNIT)  CAPS capsule Take 1 capsule (50,000 Units total) by mouth every 7 (seven) days. 11/17/19   [name], MD  diltiazem [facility]) 240 MG 24 hr capsule Take 1 capsule (240 mg total) by mouth daily. 03/18/12 03/11/13  [name], MD    Physical Exam: Vitals:   01/12/20 1527 01/13/20 0040  BP: (!) 119/58 (!) 93/42  Pulse: 88 96  Resp: 18 16  Temp: 98.7 F (37.1 C)   TempSrc: Oral   SpO2: 100% 95%    Physical Exam Constitutional:      General: She is not in acute distress. HENT:     Head: Normocephalic and atraumatic.     Mouth/Throat:     Mouth: Mucous membranes are dry.     Pharynx: Oropharynx is clear.  Eyes:      Extraocular Movements: Extraocular movements intact.     Conjunctiva/sclera: Conjunctivae normal.  Cardiovascular:     Rate and Rhythm: Normal rate and regular rhythm.     Pulses: Normal pulses.  Pulmonary:     Effort: Pulmonary effort is normal. No respiratory distress.     Breath sounds: Normal breath sounds. No wheezing or rales.  Abdominal:     General: Bowel sounds are normal. There is no distension.     Palpations: Abdomen is soft.     Tenderness: There is no abdominal tenderness. There is no guarding or rebound.  Musculoskeletal:        General: No tenderness or deformity.     Cervical back: Normal range of motion and neck supple.  Skin:    General: Skin is warm and dry.  Neurological:     General: No focal deficit present.     Mental Status: She is alert and oriented to person, place, and time.     Labs on Admission: I have personally reviewed following labs and imaging studies  CBC: Recent Labs  Lab 01/11/20 1436 01/12/20 1608  WBC 15.4* 13.4*  NEUTROABS 11,073*  --   HGB 9.2* 7.9*  HCT 28.1* 25.4*  MCV 98.6 102.0*  PLT 258 161   Basic Metabolic Panel: Recent Labs  Lab 01/11/20 1436 01/12/20 1608  NA 141 140  K 4.1 4.0  CL 104 104  CO2 23 26  GLUCOSE 121* 138*  BUN 35* 34*  CREATININE 0.81 0.86  CALCIUM 9.7 9.0   GFR: Estimated Creatinine Clearance: 62.8 mL/min (by C-G formula based on SCr of 0.86 mg/dL). Liver Function Tests: Recent Labs  Lab 01/11/20 1436 01/12/20 1608  AST 17 25  ALT 15 24  ALKPHOS  --  37*  BILITOT 0.4 0.7  PROT 6.7 6.5  ALBUMIN  --  3.8   No results for input(s): LIPASE, AMYLASE in the last 168 hours. No results for input(s): AMMONIA in the last 168 hours. Coagulation Profile: Recent Labs  Lab 01/11/20 1436  INR 0.9   Cardiac Enzymes: No results for input(s): CKTOTAL, CKMB, CKMBINDEX, TROPONINI in the last 168 hours. BNP (last 3 results) No results for input(s): PROBNP in the last 8760 hours. HbA1C: No  results for input(s): HGBA1C in the last 72 hours. CBG: No results for input(s): GLUCAP in the last 168 hours. Lipid Profile: No results for input(s): CHOL, HDL, LDLCALC, TRIG, CHOLHDL, LDLDIRECT in the last 72 hours. Thyroid Function Tests: No results for input(s): TSH, T4TOTAL, FREET4, T3FREE, THYROIDAB in the last 72 hours. Anemia Panel: No results for input(s): VITAMINB12, FOLATE, FERRITIN, TIBC, IRON, RETICCTPCT in the last 72 hours. Urine analysis:  Component Value Date/Time   COLORURINE YELLOW 11/16/2019 1522   APPEARANCEUR Cloudy (A) 11/16/2019 1522   LABSPEC >=1.030 (A) 11/16/2019 1522   PHURINE 5.0 11/16/2019 1522   GLUCOSEU NEGATIVE 11/16/2019 1522   HGBUR NEGATIVE 11/16/2019 1522   BILIRUBINUR NEGATIVE 11/16/2019 1522   KETONESUR NEGATIVE 11/16/2019 1522   UROBILINOGEN 0.2 11/16/2019 1522   NITRITE NEGATIVE 11/16/2019 [place] 11/16/2019 1522    Radiological Exams on Admission: No results found.  Assessment/Plan Principal Problem:   Upper GI bleed Active Problems:   Hyperlipidemia   Essential hypertension   Diabetes (HCC)   Acute blood loss anemia   Acute symptomatic blood loss anemia secondary to suspected upper GI bleed: Patient is presenting with complaints of generalized weakness, hematemesis, and melena.  Currently hemodynamically stable (updated blood pressure readings have not been recorded on epic).  Hemoglobin 7.9, down from 14.2 a month ago.  No prior history of GI bleed or EGD results in the chart.  She takes an aspirin daily but is not on any other antiplatelet agents or anticoagulation.  Patient denies recent NSAID use.  Denies alcohol use. -Keep n.p.o. IV PPI bolus and infusion.  IV fluid hydration.  Antiemetic as needed.  Type and screen, 1 unit PRBCs ordered.  FOBT pending.  Patient will need an EGD for further evaluation.  ED provider has sent a secure chat message to Dr. Watt Climes requesting GI consultation in the  morning.  Hypertension -Hold antihypertensives at this time given concern for upper GI bleed  Hyperlipidemia -Resume statin when no longer n.p.o.  Non-insulin-dependent type 2 diabetes: A1c 8.6 on 11/16/2019. -Check A1c.  Hold home oral hypoglycemic agents.  Order sliding scale insulin sensitive every 4 hours.  DVT prophylaxis: SCDs Code Status: Full code-confirmed with the patient. Family Communication: Brother at bedside. Disposition Plan: Status is: Observation  The patient remains OBS appropriate and will d/c before 2 midnights.  Dispo: The patient is from: Home              Anticipated d/c is to: Home              Anticipated d/c date is: 2 days              Patient currently is not medically stable to d/c.  The medical decision making on this patient was of high complexity and the patient is at high risk for clinical deterioration, therefore this is a level 3 visit.  Shela Leff MD [facility]  If 7PM-7AM, please contact night-coverage [email or web address]  01/13/2020, 4:43 AM

## 2020-01-13 NOTE — Consult Note (Addendum)
[facility] Consult: 8:29 AM 01/13/2020  LOS: 0 days    Referring Provider: Dr Doristine Bosworth  Primary Care Physician:  [name], MD Primary Gastroenterologist:  none  Pt cell phone [phone].     Reason for Consultation:  Melena, hematemesis   HPI: Bridget Grant is a 74 y.o. female.  PMH Htn.  Depression.  DM2 on oral agents, not well controlled..  Vitamin D, vitamin B12 deficient.  Dysplastic nevi.  S/p hysterectomy, cholecystectomy 2006,  Has declined colonoscopy in past. Has received Covid 19 vaccination.  Vomited blood twice on Sunday after eating pizza, no prodrome of nausea.  Melenic stools started Sunday as well and continued through yesterday Wednesday. Hasn't had any further vomiting, nausea. She was afraid to eat any solid food so she has been sticking to clear liquids.  + fatigue, weakness.  No CP, dyspnea, syncope.   81 ASA daily, no NSAIDs, PPI etc. generally has a good appetite. No significant pyrosis. No dysphagia. Weight is stable. Seen at [place] office 8/3. Hgb had gone from 14.2 >> 9.2/1 month.  After reviewing labs 8/4 PMD advised patient to go to the emergency department and to take no more aspirin. BPs to 93/42, HR to 87 in ED. Hgb this morning is down to 7.9.  MCV 102.  WBCs 13.4.  Platelets 240.  INR 0.9.  BUN 34, normal creatinine.  LFTs normal.  Glucose 138.  Transfusing 1 of 1 PRBCs.     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  Family history No colorectal cancer, GI bleeds, ulcer disease, anemia.  Past Medical History:  Diagnosis Date  . Anxiety   . Depression   . Hyperlipidemia   . Hypertension   . Migraine   . Osteopenia   . Type II or unspecified type diabetes mellitus  without mention of complication, uncontrolled 04/09/2013    Past Surgical History:  Procedure Laterality Date  . ABDOMINAL HYSTERECTOMY      Prior to Admission medications   Medication Sig Start Date End Date Taking? Authorizing Provider  atorvastatin (LIPITOR) 20 MG tablet TAKE 1 TABLET BY MOUTH EVERY DAY Patient taking differently: Take 20 mg by mouth daily.  11/09/19  Yes [name], MD  diltiazem (CARDIZEM CD) 240 MG 24 hr capsule TAKE 1 CAPSULE(240 MG) BY MOUTH DAILY Patient taking differently: Take 240 mg by mouth daily.  12/06/19  Yes [name], MD  glipiZIDE (GLUCOTROL XL) 10 MG 24 hr tablet TAKE 1 TABLET BY MOUTH DAILY WITH BREAKFAST Patient taking differently: Take 10 mg by mouth daily with breakfast.  11/09/19  Yes [name], MD  hydrochlorothiazide (MICROZIDE) 12.5 MG capsule Take 1 capsule (12.5 mg total) by mouth daily. 04/14/15  Yes [name], MD  lisinopril (ZESTRIL) 20 MG tablet TAKE 1 TABLET BY MOUTH EVERY DAY Patient taking differently: Take 20 mg by mouth at bedtime.  11/09/19  Yes [name], MD  metFORMIN [facility])  500 MG 24 hr tablet TAKE 4 TABLETS(2000 MG) BY MOUTH DAILY WITH BREAKFAST Patient taking differently: Take 1,500 mg by mouth daily with breakfast.  01/05/20  Yes [name], MD  pioglitazone (ACTOS) 15 MG tablet Take 1 tablet (15 mg total) by mouth daily. 11/17/19  Yes [name], MD  vitamin B-12 (CYANOCOBALAMIN) 1000 MCG tablet Take 1 tablet (1,000 mcg total) by mouth daily. 11/16/19  Yes [name], MD  Vitamin D, Ergocalciferol, (DRISDOL) 1.25 MG (50000 UNIT) CAPS capsule Take 1 capsule (50,000 Units total) by mouth every 7 (seven) days. Patient taking differently: Take 50,000 Units by mouth every Sunday.  11/17/19  Yes [name], MD  diltiazem [facility]) 240 MG 24 hr capsule Take 1 capsule (240 mg total) by mouth daily. 03/18/12 03/11/13  [name], MD    Scheduled Meds: . sodium chloride   Intravenous Once  . insulin aspart   0-9 Units Subcutaneous Q4H  . [START ON 01/16/2020] pantoprazole  40 mg Intravenous Q12H   Infusions: . sodium chloride 125 mL/hr at 01/13/20 0715  . pantoprozole (PROTONIX) infusion Stopped (01/13/20 0526)   PRN Meds: acetaminophen **OR** acetaminophen, ondansetron (ZOFRAN) IV   Allergies as of 01/12/2020  . (No Known Allergies)    Family History  Problem Relation Age of Onset  . Cancer Mother   . Hypertension Father     Social History   Socioeconomic History  . Marital status: Married    Spouse name: Not on file  . Number of children: Not on file  . Years of education: Not on file  . Highest education level: Not on file  Occupational History  . Not on file  Tobacco Use  . Smoking status: Never Smoker  . Smokeless tobacco: Never Used  Substance and Sexual Activity  . Alcohol use: No  . Drug use: No  . Sexual activity: Not on file  Other Topics Concern  . Not on file  Social History Narrative  . Not on file   Social Determinants of Health   Financial Resource Strain:   . Difficulty of Paying Living Expenses:   Food Insecurity:   . Worried About Charity fundraiser in the Last Year:   . Arboriculturist in the Last Year:   Transportation Needs:   . Film/video editor (Medical):   Marland Kitchen Lack of Transportation (Non-Medical):   Physical Activity:   . Days of Exercise per Week:   . Minutes of Exercise per Session:   Stress:   . Feeling of Stress :   Social Connections:   . Frequency of Communication with Friends and Family:   . Frequency of Social Gatherings with Friends and Family:   . Attends Religious Services:   . Active Member of Clubs or Organizations:   . Attends Archivist Meetings:   Marland Kitchen Marital Status:   Intimate Partner Violence:   . Fear of Current or Ex-Partner:   . Emotionally Abused:   Marland Kitchen Physically Abused:   . Sexually Abused:     REVIEW OF SYSTEMS: Constitutional: Fatigue and weakness, not profound. ENT:  No nose bleeds Pulm:  No shortness of breath, no cough CV:  No palpitations, no LE edema. No angina GU:  No hematuria, no frequency GI: See HPI. Heme: Denies unusual or excessive bleeding or bruising. Transfusions: No previous transfusions before now. Neuro:  No headaches, no peripheral tingling or numbness. No dizziness, nose syncope Derm:  No itching, no rash or  sores.  Endocrine:  No sweats or chills.  No polyuria or dysuria Immunization: Has received COVID-19 vaccination.   PHYSICAL EXAM: Vital signs in last 24 hours: Vitals:   01/13/20 0719 01/13/20 0740  BP: 117/72 (!) 127/43  Pulse: 86 87  Resp: 18 18  Temp: 98.5 F (36.9 C) 98.3 F (36.8 C)  SpO2: 100%    Wt Readings from Last 3 Encounters:  01/11/20 93.1 kg  11/16/19 93 kg  11/16/19 93 kg    General: Pleasant, cooperative, alert, good historian. Doesn't look ill. Overweight. Head: No facial asymmetry or swelling. No signs of head trauma. Eyes: No scleral icterus. No conjunctival pallor. EOMI Ears: Not hard of hearing Nose: No congestion, no discharge Mouth: Tongue midline. Oropharynx moist, pink, clear. Good dentition. Neck: No JVD, no masses, no thyromegaly Lungs: Clear bilaterally, no labored breathing, no cough Heart: RRR. No MRG. S1, S2 present Abdomen: Soft, nontender, nondistended. Slightly obese. No HSM, masses, bruits, hernias..   Rectal: Deferred Musc/Skeltl: No joint redness, swelling or gross deformity. Extremities: Slight nonpitting edema on the right ankle, top of foot. Neurologic: Fully oriented x3. Good historian. Fluid speech. Slight upper extremity tremor during effort. No weakness. Skin: No rash, no sores, no telangiectasia Nodes: No cervical adenopathy Psych: Pleasant, calm, cooperative.  Intake/Output from previous day: No intake/output data recorded. Intake/Output this shift: No intake/output data recorded.  LAB RESULTS: Recent Labs    01/11/20 1436 01/12/20 1608  WBC 15.4* 13.4*  HGB 9.2* 7.9*  HCT  28.1* 25.4*  PLT 258 240   BMET Lab Results  Component Value Date   NA 140 01/12/2020   NA 141 01/11/2020   NA 137 11/16/2019   K 4.0 01/12/2020   K 4.1 01/11/2020   K 4.2 11/16/2019   CL 104 01/12/2020   CL 104 01/11/2020   CL 101 11/16/2019   CO2 26 01/12/2020   CO2 23 01/11/2020   CO2 27 11/16/2019   GLUCOSE 138 (H) 01/12/2020   GLUCOSE 121 (H) 01/11/2020   GLUCOSE 90 11/16/2019   BUN 34 (H) 01/12/2020   BUN 35 (H) 01/11/2020   BUN 17 11/16/2019   CREATININE 0.86 01/12/2020   CREATININE 0.81 01/11/2020   CREATININE 0.83 11/16/2019   CALCIUM 9.0 01/12/2020   CALCIUM 9.7 01/11/2020   CALCIUM 10.0 11/16/2019   LFT Recent Labs    01/11/20 1436 01/12/20 1608  PROT 6.7 6.5  ALBUMIN  --  3.8  AST 17 25  ALT 15 24  ALKPHOS  --  37*  BILITOT 0.4 0.7  BILIDIR 0.1  --   IBILI 0.3  --    PT/INR Lab Results  Component Value Date   INR 0.9 01/11/2020     RADIOLOGY STUDIES: No results found.    IMPRESSION:   *   Self-limited hematemesis on Sunday, ongoing melena since then. Rule out Mallory-Weiss tear, rule out ulcer disease. Nothing in hx suggests cancer.  *   Macrocytic anemia.   Vitamin B-12 deficient in early 05/2019 but above normal levels on 11/16/2019.  No history of iron deficiency anemia when assayed in 05/2019.  *   Azotemia, creat normal  *   Blood loss anemia. Currently receiving one of one PRBCs.   PLAN:     *   EGD this afternooon Should have outpt colonoscopy at some point as well.      Azucena Freed  01/13/2020, 8:29 AM Phone [phone]  ________________________________________________________________________  Velora Heckler GI MD note:  I  personally examined the patient, reviewed the data and agree with the assessment and plan described above.  UGI bleeding, unclear etiology. She does take ASA daily but no other NSAIDs.  I am planning EGD this afternoon.   Owens Loffler, MD [facility] Pager [phone]

## 2020-01-13 NOTE — ED Notes (Signed)
GI PA at bedside. Sarah , ok'd for patient to have 1 sprite, given and patient tolerated well.

## 2020-01-13 NOTE — Progress Notes (Signed)
Patient arrived to room 5C04 in NAD, VS stable and patient free from pain. Patient oriented to room.

## 2020-01-13 NOTE — Anesthesia Postprocedure Evaluation (Addendum)
Anesthesia Post Note  Patient: Bridget Grant  Procedure(s) Performed: ESOPHAGOGASTRODUODENOSCOPY (EGD) WITH PROPOFOL (N/A ) BIOPSY     Patient location during evaluation: PACU Anesthesia Type: MAC Level of consciousness: sedated and patient cooperative Pain management: pain level controlled Vital Signs Assessment: post-procedure vital signs reviewed and stable Respiratory status: spontaneous breathing Cardiovascular status: stable Anesthetic complications: no   No complications documented.  Last Vitals:  Vitals:   01/13/20 1438 01/13/20 1448  BP: 118/61 (!) 121/55  Pulse: 78 76  Resp: (!) 22 15  Temp: 37.1 C   SpO2: 100% 100%    Last Pain:  Vitals:   01/13/20 1448  TempSrc:   PainSc: 0-No pain                 Nolon Nations

## 2020-01-13 NOTE — Progress Notes (Signed)
Patient off floor to Endo.   

## 2020-01-13 NOTE — Anesthesia Preprocedure Evaluation (Addendum)
Anesthesia Evaluation  Patient identified by MRN, date of birth, ID band Patient awake    Reviewed: Allergy & Precautions, NPO status , Patient's Chart, lab work & pertinent test results  Airway Mallampati: III  TM Distance: >3 FB Neck ROM: Full    Dental  (+) Dental Advisory Given, Teeth Intact, Chipped,    Pulmonary neg pulmonary ROS,    Pulmonary exam normal breath sounds clear to auscultation       Cardiovascular hypertension, Normal cardiovascular exam Rhythm:Regular Rate:Normal     Neuro/Psych  Headaches, PSYCHIATRIC DISORDERS Anxiety Depression    GI/Hepatic negative GI ROS, Neg liver ROS,   Endo/Other  diabetes  Renal/GU negative Renal ROS     Musculoskeletal  (+) Arthritis ,   Abdominal   Peds  Hematology  (+) Blood dyscrasia, anemia ,   Anesthesia Other Findings   Reproductive/Obstetrics negative OB ROS                             Anesthesia Physical Anesthesia Plan  ASA: III  Anesthesia Plan: MAC   Post-op Pain Management:    Induction: Intravenous  PONV Risk Score and Plan: Propofol infusion, TIVA, Treatment may vary due to age or medical condition and Ondansetron  Airway Management Planned: Natural Airway  Additional Equipment: None  Intra-op Plan:   Post-operative Plan:   Informed Consent: I have reviewed the patients History and Physical, chart, labs and discussed the procedure including the risks, benefits and alternatives for the proposed anesthesia with the patient or authorized representative who has indicated his/her understanding and acceptance.     Dental advisory given  Plan Discussed with: CRNA  Anesthesia Plan Comments:        Anesthesia Quick Evaluation

## 2020-01-13 NOTE — Progress Notes (Signed)
Patient back to room from endo.

## 2020-01-13 NOTE — Anesthesia Procedure Notes (Signed)
Procedure Name: MAC Date/Time: 01/13/2020 2:23 PM Performed by: Orlie Dakin, CRNA Pre-anesthesia Checklist: Patient identified, Suction available, Emergency Drugs available and Patient being monitored Patient Re-evaluated:Patient Re-evaluated prior to induction Oxygen Delivery Method: Nasal cannula Preoxygenation: Pre-oxygenation with 100% oxygen Induction Type: IV induction Placement Confirmation: positive ETCO2

## 2020-01-13 NOTE — Op Note (Signed)
[facility] Patient Name: Bridget Grant Procedure Date : 01/13/2020 MRN: [id number] Attending MD: Milus Banister , MD Date of Birth: 1946/05/07 CSN: [id number] Age: 74 Admit Type: Inpatient Procedure:                Upper GI endoscopy Indications:              Acute post hemorrhagic anemia, Hematemesis, Melena Providers:                Milus Banister, MD, Vista Lawman, RN, Laverda Sorenson, Technician, Vania Rea, CRNA Referring MD:              Medicines:                Monitored Anesthesia Care Complications:            No immediate complications. Estimated blood loss:                            None. Estimated Blood Loss:     Estimated blood loss: none. Procedure:                Pre-Anesthesia Assessment:                           - Prior to the procedure, a History and Physical                            was performed, and patient medications and                            allergies were reviewed. The patient's tolerance of                            previous anesthesia was also reviewed. The risks                            and benefits of the procedure and the sedation                            options and risks were discussed with the patient.                            All questions were answered, and informed consent                            was obtained. Prior Anticoagulants: The patient has                            taken no previous anticoagulant or antiplatelet                            agents. ASA Grade Assessment: II - A patient with  mild systemic disease. After reviewing the risks                            and benefits, the patient was deemed in                            satisfactory condition to undergo the procedure.                           After obtaining informed consent, the endoscope was                            passed under direct vision. Throughout the                             procedure, the patient's blood pressure, pulse, and                            oxygen saturations were monitored continuously. The                            GIF-H190 ([id number]) Olympus gastroscope was                            introduced through the mouth, and advanced to the                            second part of duodenum. The upper GI endoscopy was                            accomplished without difficulty. The patient                            tolerated the procedure well. Scope In: Scope Out: Findings:      There was an ulcerated mass along the greater curvature of the stomach,       proximal edge was about 5-6cm from the GE junction and the mass was       7-8cm long, 2-3cm wide. It was not circumferental. The mass was friable       with spontaneous oozing, contact oozing, ulcerated and heaped up. Some       parts of the mass were quite soft whereas other areas were more firm.       This was very suspicious for a malignancy and it was biopsied       extensively.      There was mild distal gastritis, biopsied to check for H. pylori.      Small hiatal hernia.      The exam was otherwise without abnormality. Impression:               - There was an ulcerated mass along the greater                            curvature of the stomach, proximal edge was about  5-6cm from the GE junction and the mass was 7-8cm                            long, 2-3cm wide. It was not circumferental. The                            mass was friable with spontaneous oozing, contact                            oozing, ulcerated and heaped up. Some parts of the                            mass were quite soft whereas other areas were more                            firm. This was very suspicious for a malignancy and                            it was biopsied extensively.                           There was mild distal gastritis, biopsied to check                            for H.  pylori.                           - The examination was otherwise normal. Recommendation:           - Would transfuse one more unit of blood (her Hb                            did not bump at all after the first unit today) and                            the mass in her stomach is very friable.                            Hospitalist to do this, please.                           - Continue IV PPI, BID                           - Clears liquids, advance as tolerated.                           - I am going to order staging CT scans. Procedure Code(s):        --- Professional ---                           [phone], Esophagogastroduodenoscopy, flexible,  transoral; with biopsy, single or multiple Diagnosis Code(s):        --- Professional ---                           K29.70, Gastritis, unspecified, without bleeding                           D62, Acute posthemorrhagic anemia                           K92.0, Hematemesis                           K92.1, Melena (includes Hematochezia) CPT copyright 2019 [employer] Association. All rights reserved. The codes documented in this report are preliminary and upon coder review may  be revised to meet current compliance requirements. Milus Banister, MD 01/13/2020 2:43:51 PM This report has been signed electronically. Number of Addenda: 0

## 2020-01-13 NOTE — ED Provider Notes (Signed)
[facility] EMERGENCY DEPARTMENT Provider Note   CSN: [id number] Arrival date & time: 01/12/20  1439     History Chief Complaint  Patient presents with  . Abnormal Lab  . Hematemesis    Bridget Grant is a 74 y.o. female.  The history is provided by the patient and medical records.  Abnormal Lab   74 year old female with history of anxiety, depression, hyperlipidemia, hypertension, diabetes, presenting to the ED with dark stools and hematemesis.  States she ate pizza on Sunday and shortly after started having some nausea and vomiting.  Over the past few days she has had dark, tarry diarrhea along with scattered episodes of hematemesis.  She denies any significant pain in her abdomen, chest, or throat.  She does report ongoing nausea and poor appetite, she has mostly been drinking fluids and eating jello.  She denies fever/chills.  No hx of prior GI bleed.  She has never had prior colonoscopy or endoscopy-- states she avoided it intentionally.  She takes 81mg  ASA daily, PCP has told her to hold this.  Denies heavy NSAID use. Denies dizziness or feelings of syncope, mild generalized weakness.    Past Medical History:  Diagnosis Date  . Anxiety   . Depression   . Hyperlipidemia   . Hypertension   . Migraine   . Osteopenia   . Type II or unspecified type diabetes mellitus without mention of complication, uncontrolled 04/09/2013    Patient Active Problem List   Diagnosis Date Noted  . Hematemesis without nausea 01/11/2020  . Melena 01/11/2020  . Vitamin D deficiency 11/16/2019  . Vitamin B12 deficiency 11/16/2019  . Lipoma 04/14/2015  . Osteoarthritis of left knee 04/01/2014  . Left knee pain 03/22/2014  . Venous (peripheral) insufficiency 03/22/2014  . Diabetes ([place]) 04/09/2013  . Migraine   . Preventative health care 05/28/2011  . MENOPAUSAL DISORDER 12/01/2009  . LEG PAIN, LEFT 02/03/2008  . TREMOR 02/03/2008  . Hyperlipidemia 05/27/2007  . ANXIETY  05/27/2007  . Depression 05/27/2007  . POSITIONAL VERTIGO 05/27/2007  . Essential hypertension 05/27/2007  . OSTEOPENIA 05/27/2007    Past Surgical History:  Procedure Laterality Date  . ABDOMINAL HYSTERECTOMY       OB History   No obstetric history on file.     Family History  Problem Relation Age of Onset  . Cancer Mother   . Hypertension Father     Social History   Tobacco Use  . Smoking status: Never Smoker  . Smokeless tobacco: Never Used  Substance Use Topics  . Alcohol use: No  . Drug use: No    Home Medications Prior to Admission medications   Medication Sig Start Date End Date Taking? Authorizing Provider  atorvastatin (LIPITOR) 20 MG tablet TAKE 1 TABLET BY MOUTH EVERY DAY 11/09/19   [name], MD  diltiazem (CARDIZEM CD) 240 MG 24 hr capsule TAKE 1 CAPSULE(240 MG) BY MOUTH DAILY 12/06/19   [name], MD  glipiZIDE (GLUCOTROL XL) 10 MG 24 hr tablet TAKE 1 TABLET BY MOUTH DAILY WITH BREAKFAST 11/09/19   [name], MD  hydrochlorothiazide (MICROZIDE) 12.5 MG capsule Take 1 capsule (12.5 mg total) by mouth daily. 04/14/15   [name], MD  lisinopril (ZESTRIL) 20 MG tablet TAKE 1 TABLET BY MOUTH EVERY DAY 11/09/19   [name], MD  metFORMIN (GLUCOPHAGE-XR) 500 MG 24 hr tablet TAKE 4 TABLETS(2000 MG) BY MOUTH DAILY WITH BREAKFAST 01/05/20   [name], MD  pioglitazone (ACTOS) 15 MG tablet Take 1 tablet (15 mg total) by mouth daily. 11/17/19   [name], MD  vitamin B-12 (CYANOCOBALAMIN) 1000 MCG tablet Take 1 tablet (1,000 mcg total) by mouth daily. 11/16/19   [name], MD  Vitamin D, Ergocalciferol, (DRISDOL) 1.25 MG (50000 UNIT) CAPS capsule Take 1 capsule (50,000 Units total) by mouth every 7 (seven) days. 11/17/19   [name], MD  diltiazem [facility]) 240 MG 24 hr capsule Take 1 capsule (240 mg total) by mouth daily. 03/18/12 03/11/13  [name], MD    Allergies    Patient has no known allergies.  Review of Systems   Review of Systems   Gastrointestinal: Positive for blood in stool, nausea and vomiting.  All other systems reviewed and are negative.   Physical Exam Updated Vital Signs BP (!) 93/42 (BP Location: Right Arm)   Pulse 96   Temp 98.7 F (37.1 C) (Oral)   Resp 16   SpO2 95%   Physical Exam Vitals and nursing note reviewed.  Constitutional:      Appearance: She is well-developed.  HENT:     Head: Normocephalic and atraumatic.  Eyes:     Conjunctiva/sclera: Conjunctivae normal.     Pupils: Pupils are equal, round, and reactive to light.  Cardiovascular:     Rate and Rhythm: Normal rate and regular rhythm.     Heart sounds: Normal heart sounds.  Pulmonary:     Effort: Pulmonary effort is normal. No respiratory distress.     Breath sounds: Normal breath sounds. No rhonchi.  Abdominal:     General: Bowel sounds are normal.     Palpations: Abdomen is soft.     Tenderness: There is no abdominal tenderness. There is no rebound.     Comments: Obese abdomen, soft, non-tender  Musculoskeletal:        General: Normal range of motion.     Cervical back: Normal range of motion.  Skin:    General: Skin is warm and dry.  Neurological:     Mental Status: She is alert and oriented to person, place, and time.     ED Results / Procedures / Treatments   Labs (all labs ordered are listed, but only abnormal results are displayed) Labs Reviewed  COMPREHENSIVE METABOLIC PANEL - Abnormal; Notable for the following components:      Result Value   Glucose, Bld 138 (*)    BUN 34 (*)    Alkaline Phosphatase 37 (*)    All other components within normal limits  CBC - Abnormal; Notable for the following components:   WBC 13.4 (*)    RBC 2.49 (*)    Hemoglobin 7.9 (*)    HCT 25.4 (*)    MCV 102.0 (*)    nRBC 0.4 (*)    All other components within normal limits  SARS CORONAVIRUS 2 BY RT PCR (HOSPITAL ORDER, [place] [facility])  POC OCCULT BLOOD, ED  TYPE AND SCREEN  ABO/RH     EKG None  Radiology No results found.  Procedures Procedures (including critical care time)  CRITICAL CARE Performed by: Larene Pickett   Total critical care time: 35 minutes  Critical care time was exclusive of separately billable procedures and treating other patients.  Critical care was necessary to treat or prevent imminent or life-threatening deterioration.  Critical care was time spent personally by me on the following activities: development of treatment plan with patient and/or surrogate  as well as nursing, discussions with consultants, evaluation of patient's response to treatment, examination of patient, obtaining history from patient or surrogate, ordering and performing treatments and interventions, ordering and review of laboratory studies, ordering and review of radiographic studies, pulse oximetry and re-evaluation of patient's condition.   Medications Ordered in ED Medications - No data to display  ED Course  I have reviewed the triage vital signs and the nursing notes.  Pertinent labs & imaging results that were available during my care of the patient were reviewed by me and considered in my medical decision making (see chart for details).    MDM Rules/Calculators/A&P  74 y.o. F here with likely upper GI bleed.  Has had dark, tarry diarrhea and hematemesis x2 since eating pizza Sunday night.  No prior hx of GI bleeding, no prior GI evaluations (colonscopy/endoscopy).  She is afebrile and nontoxic in appearance here.  She is hemodynamically stable-- had 1 low BP of 93/42 documented around midnight while in lobby but BP 119/52 on arrival to room.  Her abdomen is soft and nontender.  She denies any current abdominal pain, just some persistent nausea.  Labs as above, hemoglobin down to 7.9, was 9.2 yesterday with PCP.  She has held her baby aspirin as instructed by primary care doctor.  She will require admission for likely upper endoscopy.  Will start Protonix  drip and IV fluids.  Discussed with hospitalist, Dr. Marlowe Sax-- will admit for ongoing care.  I have placed request for non-emergent GI consultation via messaging service to on call physician, Dr. Watt Climes with [facility].  Final Clinical Impression(s) / ED Diagnoses Final diagnoses:  Upper GI bleed    Rx / DC Orders ED Discharge Orders    None       Larene Pickett, PA-C 01/13/20 0507    Mesner, Corene Cornea, MD 01/13/20 [phone]

## 2020-01-14 ENCOUNTER — Other Ambulatory Visit: Payer: Self-pay | Admitting: Physician Assistant

## 2020-01-14 ENCOUNTER — Encounter (HOSPITAL_COMMUNITY): Payer: Self-pay | Admitting: Gastroenterology

## 2020-01-14 DIAGNOSIS — N6320 Unspecified lump in the left breast, unspecified quadrant: Secondary | ICD-10-CM | POA: Diagnosis present

## 2020-01-14 DIAGNOSIS — Z7984 Long term (current) use of oral hypoglycemic drugs: Secondary | ICD-10-CM | POA: Diagnosis not present

## 2020-01-14 DIAGNOSIS — Z9049 Acquired absence of other specified parts of digestive tract: Secondary | ICD-10-CM | POA: Diagnosis not present

## 2020-01-14 DIAGNOSIS — D539 Nutritional anemia, unspecified: Secondary | ICD-10-CM | POA: Diagnosis present

## 2020-01-14 DIAGNOSIS — I10 Essential (primary) hypertension: Secondary | ICD-10-CM | POA: Diagnosis present

## 2020-01-14 DIAGNOSIS — Z803 Family history of malignant neoplasm of breast: Secondary | ICD-10-CM | POA: Diagnosis not present

## 2020-01-14 DIAGNOSIS — Z8249 Family history of ischemic heart disease and other diseases of the circulatory system: Secondary | ICD-10-CM | POA: Diagnosis not present

## 2020-01-14 DIAGNOSIS — I959 Hypotension, unspecified: Secondary | ICD-10-CM | POA: Diagnosis present

## 2020-01-14 DIAGNOSIS — F418 Other specified anxiety disorders: Secondary | ICD-10-CM | POA: Diagnosis present

## 2020-01-14 DIAGNOSIS — E538 Deficiency of other specified B group vitamins: Secondary | ICD-10-CM | POA: Diagnosis present

## 2020-01-14 DIAGNOSIS — Z20822 Contact with and (suspected) exposure to covid-19: Secondary | ICD-10-CM | POA: Diagnosis present

## 2020-01-14 DIAGNOSIS — K2970 Gastritis, unspecified, without bleeding: Secondary | ICD-10-CM | POA: Diagnosis present

## 2020-01-14 DIAGNOSIS — K922 Gastrointestinal hemorrhage, unspecified: Secondary | ICD-10-CM | POA: Diagnosis present

## 2020-01-14 DIAGNOSIS — D490 Neoplasm of unspecified behavior of digestive system: Secondary | ICD-10-CM | POA: Diagnosis not present

## 2020-01-14 DIAGNOSIS — E119 Type 2 diabetes mellitus without complications: Secondary | ICD-10-CM | POA: Diagnosis present

## 2020-01-14 DIAGNOSIS — Z8041 Family history of malignant neoplasm of ovary: Secondary | ICD-10-CM | POA: Diagnosis not present

## 2020-01-14 DIAGNOSIS — K3189 Other diseases of stomach and duodenum: Secondary | ICD-10-CM

## 2020-01-14 DIAGNOSIS — Z7982 Long term (current) use of aspirin: Secondary | ICD-10-CM | POA: Diagnosis not present

## 2020-01-14 DIAGNOSIS — E785 Hyperlipidemia, unspecified: Secondary | ICD-10-CM | POA: Diagnosis present

## 2020-01-14 DIAGNOSIS — K920 Hematemesis: Secondary | ICD-10-CM | POA: Diagnosis present

## 2020-01-14 DIAGNOSIS — Z79899 Other long term (current) drug therapy: Secondary | ICD-10-CM | POA: Diagnosis not present

## 2020-01-14 DIAGNOSIS — K921 Melena: Secondary | ICD-10-CM | POA: Diagnosis present

## 2020-01-14 DIAGNOSIS — D62 Acute posthemorrhagic anemia: Secondary | ICD-10-CM | POA: Diagnosis present

## 2020-01-14 DIAGNOSIS — C49A2 Gastrointestinal stromal tumor of stomach: Secondary | ICD-10-CM | POA: Diagnosis present

## 2020-01-14 DIAGNOSIS — M8580 Other specified disorders of bone density and structure, unspecified site: Secondary | ICD-10-CM | POA: Diagnosis present

## 2020-01-14 LAB — BASIC METABOLIC PANEL
Anion gap: 9 (ref 5–15)
BUN: 17 mg/dL (ref 8–23)
CO2: 23 mmol/L (ref 22–32)
Calcium: 8 mg/dL — ABNORMAL LOW (ref 8.9–10.3)
Chloride: 110 mmol/L (ref 98–111)
Creatinine, Ser: 0.75 mg/dL (ref 0.44–1.00)
GFR calc Af Amer: >60 mL/min (ref >60)
GFR calc non Af Amer: >60 mL/min (ref >60)
Glucose, Bld: 134 mg/dL — ABNORMAL HIGH (ref 70–99)
Potassium: 4.1 mmol/L (ref 3.5–5.1)
Sodium: 142 mmol/L (ref 135–145)

## 2020-01-14 LAB — CBC WITH DIFFERENTIAL/PLATELET
Abs Immature Granulocytes: 0.2 10*3/uL — ABNORMAL HIGH (ref 0.00–0.07)
Basophils Absolute: 0.1 10*3/uL (ref 0.0–0.1)
Basophils Relative: 1 %
Eosinophils Absolute: 0.4 10*3/uL (ref 0.0–0.5)
Eosinophils Relative: 4 %
HCT: 22.8 % — ABNORMAL LOW (ref 36.0–46.0)
Hemoglobin: 7.4 g/dL — ABNORMAL LOW (ref 12.0–15.0)
Immature Granulocytes: 2 %
Lymphocytes Relative: 24 %
Lymphs Abs: 2.5 10*3/uL (ref 0.7–4.0)
MCH: 32 pg (ref 26.0–34.0)
MCHC: 32.5 g/dL (ref 30.0–36.0)
MCV: 98.7 fL (ref 80.0–100.0)
Monocytes Absolute: 1 10*3/uL (ref 0.1–1.0)
Monocytes Relative: 9 %
Neutro Abs: 6.4 10*3/uL (ref 1.7–7.7)
Neutrophils Relative %: 60 %
Platelets: 212 10*3/uL (ref 150–400)
RBC: 2.31 MIL/uL — ABNORMAL LOW (ref 3.87–5.11)
RDW: 16.2 % — ABNORMAL HIGH (ref 11.5–15.5)
WBC: 10.6 10*3/uL — ABNORMAL HIGH (ref 4.0–10.5)
nRBC: 0.8 % — ABNORMAL HIGH (ref 0.0–0.2)

## 2020-01-14 LAB — HEMOGLOBIN AND HEMATOCRIT, BLOOD
HCT: 26 % — ABNORMAL LOW (ref 36.0–46.0)
Hemoglobin: 8.4 g/dL — ABNORMAL LOW (ref 12.0–15.0)

## 2020-01-14 LAB — GLUCOSE, CAPILLARY
Glucose-Capillary: 121 mg/dL — ABNORMAL HIGH (ref 70–99)
Glucose-Capillary: 120 mg/dL — ABNORMAL HIGH (ref 70–99)
Glucose-Capillary: 125 mg/dL — ABNORMAL HIGH (ref 70–99)
Glucose-Capillary: 110 mg/dL — ABNORMAL HIGH (ref 70–99)

## 2020-01-14 LAB — PREPARE RBC (CROSSMATCH)

## 2020-01-14 MED ORDER — SODIUM CHLORIDE 0.9% IV SOLUTION: Freq: Once | INTRAVENOUS | Status: AC

## 2020-01-14 MED ORDER — DILTIAZEM HCL ER COATED BEADS 120 MG PO CP24
240.0000 mg | ORAL_CAPSULE | Freq: Every day | ORAL | Status: DC
  Administered 2020-01-14 – 2020-01-16 (×3): 240 mg via ORAL
  Filled 2020-01-14 (×3): qty 2

## 2020-01-14 NOTE — Care Management Obs Status (Cosign Needed)
[facility] NOTIFICATION   Patient Details  Name: TESSICA CUPO MRN: [id number] Date of Birth: 06/19/1945   Medicare Observation Status Notification Given:  Yes    Curlene Labrum, RN 01/14/2020, 8:54 AM

## 2020-01-14 NOTE — TOC Initial Note (Signed)
Transition of Care [facility]) - Initial/Assessment Note    Patient Details  Name: Bridget Grant MRN: [id number] Date of Birth: 28-Dec-1945  [facility] CM/SW Contact:    Curlene Labrum, RN Phone Number: 01/14/2020, 9:34 AM  Clinical Narrative:                 Case management met with patient and presented the medicare moon form.  The patient is anxious about discharging home once her tests and results are complete.  The patient takes care of her debilitated husband at home and has more concerns for providing care for him versus herself.  Support was given to the patient to seek family supports as needed during her hospitalization and reach out to her husband PCP for assistance for care issues.  Will follow for discharge needs.  Expected Discharge Plan: Home/Self Care Barriers to Discharge: No Barriers Identified   Patient Goals and CMS Choice Patient states their goals for this hospitalization and ongoing recovery are:: Patient plans to discharge to home. CMS Medicare.gov Compare Post Acute Care list provided to:: Patient    Expected Discharge Plan and Services Expected Discharge Plan: Home/Self Care       Living arrangements for the past 2 months: Single Family Home                                      Prior Living Arrangements/Services Living arrangements for the past 2 months: Single Family Home Lives with:: Spouse Patient language and need for interpreter reviewed:: Yes Do you feel safe going back to the place where you live?: Yes      Need for Family Participation in Patient Care: Yes (Comment) Care giver support system in place?: Yes (comment)   Criminal Activity/Legal Involvement Pertinent to Current Situation/Hospitalization: No - Comment as needed  Activities of Daily Living Home Assistive Devices/Equipment: Eyeglasses ADL Screening (condition at time of admission) Patient's cognitive ability adequate to safely complete daily activities?:  Yes Is the patient deaf or have difficulty hearing?: No Does the patient have difficulty seeing, even when wearing glasses/contacts?: No Does the patient have difficulty concentrating, remembering, or making decisions?: No Patient able to express need for assistance with ADLs?: Yes Does the patient have difficulty dressing or bathing?: No Independently performs ADLs?: Yes (appropriate for developmental age) Does the patient have difficulty walking or climbing stairs?: No Weakness of Legs: None Weakness of Arms/Hands: None  Permission Sought/Granted Permission sought to share information with : Case Manager Permission granted to share information with : Yes, Verbal Permission Granted        Permission granted to share info w Relationship: family contacts     Emotional Assessment Appearance:: Appears stated age Attitude/Demeanor/Rapport: Gracious Affect (typically observed): Accepting Orientation: : Oriented to Self, Oriented to Place, Oriented to  Time, Oriented to Situation Alcohol / Substance Use: Not Applicable Psych Involvement: No (comment)  Admission diagnosis:  Upper GI bleed [K92.2] Patient Active Problem List   Diagnosis Date Noted  . Upper GI bleed 01/13/2020  . Acute blood loss anemia 01/13/2020  . Hematemesis without nausea 01/11/2020  . Melena 01/11/2020  . Vitamin D deficiency 11/16/2019  . Vitamin B12 deficiency 11/16/2019  . Lipoma 04/14/2015  . Osteoarthritis of left knee 04/01/2014  . Left knee pain 03/22/2014  . Venous (peripheral) insufficiency 03/22/2014  . Diabetes ([place]) 04/09/2013  . Migraine   . Preventative health care  05/28/2011  . MENOPAUSAL DISORDER 12/01/2009  . LEG PAIN, LEFT 02/03/2008  . TREMOR 02/03/2008  . Hyperlipidemia 05/27/2007  . ANXIETY 05/27/2007  . Depression 05/27/2007  . POSITIONAL VERTIGO 05/27/2007  . Essential hypertension 05/27/2007  . OSTEOPENIA 05/27/2007   PCP:  [name], MD Pharmacy:   [facility]  [place], [place] - [place] [address] [place] [address] [place] [address] Phone: [phone] Fax: [place], [place] - [address] [place] [address] Phone: [phone] Fax: [phone]     Social Determinants of Health (SDOH) Interventions    Readmission Risk Interventions No flowsheet data found.

## 2020-01-14 NOTE — Consult Note (Signed)
Bridget Grant January 24, 1946  [id number].    Requesting MD: Dr. Owens Loffler Chief Complaint/Reason for Consult: GI bleed / malignant appearing gastric mass on EGD yesterday  HPI: Bridget Grant is a 74 y.o. female with a history of HTN, HLD, DM2 who presented to [facility] on 8/5 for hematemesis and melena.   Patient reports on 8/1 after eating a pizza, she had an episode of hematemesis x2.  No prodrome of nausea.  Since then she has had no associated nausea or further emesis. She reports that later that evening she began having melanotic diarrhea.  She called her PCPs office on Monday and got blood work.  She was told to go to the emergency department.  The patient was noted to have hypotension with BP of 93/42.  Hemoglobin noted to be 7.9, down from baseline of 14.2 on 11/16/2019.  Patient was admitted to [facility].  GI was consulted.  Patient underwent EGD that showed an ulcerated non-circumferentalmass along the greater curvature of the stomach, with the proximal edge about 5-6cm from the GE junction and the mass was 7-8cm long, 2-3cm wide. The mass was friable with spontaneous oozing. It was biopsied and path is still pending. She received 1U PRBC after EGD. Hgb was 7.4 this AM from 7.9. She is currently on CLD and tolerating without n/v. Her melanotic stools are starting to resolve and become more formed. She has had 2 BM's today.    Patient reports she has never had similar symptoms as this before. She reports she has lost 30 pounds in the last 1 year but this was intentional with watching her diet.  She reports no change in appetite and denies any early satiety.  She has never had any associated abdominal pain.  She reports her last mammography was greater than 10 years ago.  She does do self checks and has never felt any abnormalities.  She does report a family history of breast cancer including her mom who was diagnosed in her 53s and also developed ovarian cancer at the age of 81.  No back  pain.  Patient is retired.  She takes care of her paralyzed husband and is anxious to get back to him.  She denies any alcohol or tobacco use.  No illicit drug use.  She is not any blood thinners.  She has had a prior abdominal hysterectomy and laparoscopic cholecystectomy.  ROS: Review of Systems  Constitutional: Positive for weight loss (intentional). Negative for chills and fever.  Respiratory: Negative for cough and shortness of breath.   Cardiovascular: Negative for chest pain and leg swelling.  Gastrointestinal: Positive for melena and vomiting. Negative for abdominal pain, blood in stool, constipation, diarrhea and nausea.  Musculoskeletal: Negative for back pain.  Skin: Negative for rash.  Neurological: Negative for weakness.  Psychiatric/Behavioral: Negative for substance abuse.  All other systems reviewed and are negative.   Family History  Problem Relation Age of Onset  . Cancer Mother   . Hypertension Father     Past Medical History:  Diagnosis Date  . Anxiety   . Depression   . Hyperlipidemia   . Hypertension   . Migraine   . Osteopenia   . Type II or unspecified type diabetes mellitus without mention of complication, uncontrolled 04/09/2013  . Upper GI bleed 01/13/2020    Past Surgical History:  Procedure Laterality Date  . ABDOMINAL HYSTERECTOMY    . CHOLECYSTECTOMY      Social History:  reports that she  has never smoked. She has never used smokeless tobacco. She reports that she does not drink alcohol and does not use drugs.  Allergies: No Known Allergies  Medications Prior to Admission  Medication Sig Dispense Refill  . atorvastatin (LIPITOR) 20 MG tablet TAKE 1 TABLET BY MOUTH EVERY DAY (Patient taking differently: Take 20 mg by mouth daily. ) 90 tablet 3  . diltiazem (CARDIZEM CD) 240 MG 24 hr capsule TAKE 1 CAPSULE(240 MG) BY MOUTH DAILY (Patient taking differently: Take 240 mg by mouth daily. ) 90 capsule 3  . glipiZIDE (GLUCOTROL XL) 10 MG 24 hr  tablet TAKE 1 TABLET BY MOUTH DAILY WITH BREAKFAST (Patient taking differently: Take 10 mg by mouth daily with breakfast. ) 90 tablet 0  . hydrochlorothiazide (MICROZIDE) 12.5 MG capsule Take 1 capsule (12.5 mg total) by mouth daily. 90 capsule 3  . lisinopril (ZESTRIL) 20 MG tablet TAKE 1 TABLET BY MOUTH EVERY DAY (Patient taking differently: Take 20 mg by mouth at bedtime. ) 90 tablet 3  . metFORMIN (GLUCOPHAGE-XR) 500 MG 24 hr tablet TAKE 4 TABLETS(2000 MG) BY MOUTH DAILY WITH BREAKFAST (Patient taking differently: Take 1,500 mg by mouth daily with breakfast. ) 360 tablet 1  . pioglitazone (ACTOS) 15 MG tablet Take 1 tablet (15 mg total) by mouth daily. 90 tablet 3  . vitamin B-12 (CYANOCOBALAMIN) 1000 MCG tablet Take 1 tablet (1,000 mcg total) by mouth daily. 90 tablet 3  . Vitamin D, Ergocalciferol, (DRISDOL) 1.25 MG (50000 UNIT) CAPS capsule Take 1 capsule (50,000 Units total) by mouth every 7 (seven) days. (Patient taking differently: Take 50,000 Units by mouth every Sunday. ) 12 capsule 0     Physical Exam: Blood pressure (!) 126/58, pulse 79, temperature 99 F (37.2 C), temperature source Oral, resp. rate 20, height 5\' 2"  (1.575 m), weight 93.2 kg, SpO2 95 %. General: pleasant, WD/WN white female who is laying in bed in NAD HEENT: head is normocephalic, atraumatic.  Sclera are noninjected.  PERRL.  Ears and nose without any masses or lesions.  Mouth is pink and moist. Dentition fair Heart: regular, rate, and rhythm.  Normal s1,s2. No obvious murmurs, gallops, or rubs noted.  Palpable pedal pulses bilaterally  Lungs: CTAB, no wheezes, rhonchi, or rales noted.  Respiratory effort non labored Breast: Chaperone present, PA. No obvious lumps or abnormal tissue. No nipple discharge. Areolar tissue is normal.  Abd: Soft, NT/ND, +BS, no masses, hernias, or organomegaly. Prior hysterectomy and cholecystectomy scars are well healed  MS: no BUE/BLE edema, calves soft and nontender Skin: warm and  dry with no masses, lesions, or rashes Psych: A&Ox4 with an appropriate affect Neuro: cranial nerves grossly intact, equal strength in BUE/BLE bilaterally, normal speech, though process intact  Results for orders placed or performed during the hospital encounter of 01/12/20 (from the past 48 hour(s))  Comprehensive metabolic panel     Status: Abnormal   Collection Time: 01/12/20  4:08 PM  Result Value Ref Range   Sodium 140 135 - 145 mmol/L   Potassium 4.0 3.5 - 5.1 mmol/L   Chloride 104 98 - 111 mmol/L   CO2 26 22 - 32 mmol/L   Glucose, Bld 138 (H) 70 - 99 mg/dL    Comment: Glucose reference range applies only to samples taken after fasting for at least 8 hours.   BUN 34 (H) 8 - 23 mg/dL   Creatinine, Ser 0.86 0.44 - 1.00 mg/dL   Calcium 9.0 8.9 - 10.3 mg/dL   Total  Protein 6.5 6.5 - 8.1 g/dL   Albumin 3.8 3.5 - 5.0 g/dL   AST 25 15 - 41 U/L   ALT 24 0 - 44 U/L   Alkaline Phosphatase 37 (L) 38 - 126 U/L   Total Bilirubin 0.7 0.3 - 1.2 mg/dL   GFR calc non Af Amer >60 >60 mL/min   GFR calc Af Amer >60 >60 mL/min   Anion gap 10 5 - 15    Comment: Performed at [facility] [address], [place], [place] [address]  CBC     Status: Abnormal   Collection Time: 01/12/20  4:08 PM  Result Value Ref Range   WBC 13.4 (H) 4.0 - 10.5 K/uL   RBC 2.49 (L) 3.87 - 5.11 MIL/uL   Hemoglobin 7.9 (L) 12.0 - 15.0 g/dL   HCT 25.4 (L) 36 - 46 %   MCV 102.0 (H) 80.0 - 100.0 fL   MCH 31.7 26.0 - 34.0 pg   MCHC 31.1 30.0 - 36.0 g/dL   RDW 13.2 11.5 - 15.5 %   Platelets 240 150 - 400 K/uL   nRBC 0.4 (H) 0.0 - 0.2 %    Comment: Performed at [facility] [address] [place], [place] [address]  Type and screen [place]     Status: None (Preliminary result)   Collection Time: 01/12/20  4:08 PM  Result Value Ref Range   ABO/RH(D) O POS    Antibody Screen NEG    Sample Expiration 01/15/2020,2359    Unit Number [id number]    Blood Component Type RED CELLS,LR     Unit division 00    Status of Unit ISSUED,FINAL    Transfusion Status OK TO TRANSFUSE    Crossmatch Result      Compatible Performed at [facility], [place] [address], [place], [place] [address]    Unit Number [id number]    Blood Component Type RED CELLS,LR    Unit division 00    Status of Unit ALLOCATED    Transfusion Status OK TO TRANSFUSE    Crossmatch Result Compatible    Unit Number [id number]    Blood Component Type RED CELLS,LR    Unit division 00    Status of Unit ALLOCATED    Transfusion Status OK TO TRANSFUSE    Crossmatch Result Compatible   Prepare RBC (crossmatch)     Status: None   Collection Time: 01/13/20  5:22 AM  Result Value Ref Range   Order Confirmation      ORDER PROCESSED BY BLOOD BANK Performed at [facility], [place] [address] [place], [place] [address]   CBG monitoring, ED     Status: Abnormal   Collection Time: 01/13/20  5:44 AM  Result Value Ref Range   Glucose-Capillary 158 (H) 70 - 99 mg/dL    Comment: Glucose reference range applies only to samples taken after fasting for at least 8 hours.  SARS Coronavirus 2 by RT PCR (hospital order, performed in [facility] hospital lab) Nasopharyngeal Nasopharyngeal Swab     Status: None   Collection Time: 01/13/20  5:59 AM   Specimen: Nasopharyngeal Swab  Result Value Ref Range   SARS Coronavirus 2 NEGATIVE NEGATIVE    Comment: (NOTE) SARS-CoV-2 target nucleic acids are NOT DETECTED.  The SARS-CoV-2 RNA is generally detectable in upper and lower respiratory specimens during the acute phase of infection. The lowest concentration of SARS-CoV-2 viral copies this assay can detect is  250 copies / mL. A negative result does not preclude SARS-CoV-2 infection and should not be used as the sole basis for treatment or other patient management decisions.  A negative result may occur with improper specimen collection / handling, submission of specimen other than nasopharyngeal swab, presence of  viral mutation(s) within the areas targeted by this assay, and inadequate number of viral copies (<250 copies / mL). A negative result must be combined with clinical observations, patient history, and epidemiological information.  Fact Sheet for Patients:   StrictlyIdeas.no  Fact Sheet for Healthcare Providers: BankingDealers.co.za  This test is not yet approved or  cleared by the [place] FDA and has been authorized for detection and/or diagnosis of SARS-CoV-2 by FDA under an Emergency Use Authorization (EUA).  This EUA will remain in effect (meaning this test can be used) for the duration of the COVID-19 declaration under Section 564(b)(1) of the Act, 21 U.S.C. section 360bbb-3(b)(1), unless the authorization is terminated or revoked sooner.  Performed at [facility], [place] [address] [place], [place] [address]   ABO/Rh     Status: None   Collection Time: 01/13/20  6:05 AM  Result Value Ref Range   ABO/RH(D)      O POS Performed at [facility] [address], [place], [place] [address]   Glucose, capillary     Status: Abnormal   Collection Time: 01/13/20 11:51 AM  Result Value Ref Range   Glucose-Capillary 145 (H) 70 - 99 mg/dL    Comment: Glucose reference range applies only to samples taken after fasting for at least 8 hours.  CBC with Differential/Platelet     Status: Abnormal   Collection Time: 01/13/20 12:20 PM  Result Value Ref Range   WBC 13.9 (H) 4.0 - 10.5 K/uL   RBC 2.56 (L) 3.87 - 5.11 MIL/uL   Hemoglobin 7.9 (L) 12.0 - 15.0 g/dL   HCT 25.0 (L) 36 - 46 %   MCV 97.7 80.0 - 100.0 fL   MCH 30.9 26.0 - 34.0 pg   MCHC 31.6 30.0 - 36.0 g/dL   RDW 15.6 (H) 11.5 - 15.5 %   Platelets 203 150 - 400 K/uL   nRBC 0.6 (H) 0.0 - 0.2 %   Neutrophils Relative % 68 %   Neutro Abs 9.5 (H) 1.7 - 7.7 K/uL   Lymphocytes Relative 21 %   Lymphs Abs 2.9 0.7 - 4.0 K/uL   Monocytes Relative 9 %   Monocytes Absolute  1.2 (H) 0 - 1 K/uL   Eosinophils Relative 1 %   Eosinophils Absolute 0.2 0 - 0 K/uL   Basophils Relative 0 %   Basophils Absolute 0.0 0 - 0 K/uL   Immature Granulocytes 1 %   Abs Immature Granulocytes 0.17 (H) 0.00 - 0.07 K/uL    Comment: Performed at [facility], [address], [place], [place] [address]  Glucose, capillary     Status: Abnormal   Collection Time: 01/13/20  4:04 PM  Result Value Ref Range   Glucose-Capillary 109 (H) 70 - 99 mg/dL    Comment: Glucose reference range applies only to samples taken after fasting for at least 8 hours.  Glucose, capillary     Status: Abnormal   Collection Time: 01/13/20  9:43 PM  Result Value Ref Range   Glucose-Capillary 123 (H) 70 - 99 mg/dL    Comment: Glucose reference range applies only to samples taken after fasting for at least 8 hours.  Glucose, capillary     Status: Abnormal   Collection Time: 01/14/20  6:13 AM  Result Value Ref Range   Glucose-Capillary 121 (H) 70 - 99 mg/dL    Comment: Glucose reference range applies only to samples taken after fasting for at least 8 hours.  CBC with Differential/Platelet     Status: Abnormal   Collection Time: 01/14/20  9:58 AM  Result Value Ref Range   WBC 10.6 (H) 4.0 - 10.5 K/uL   RBC 2.31 (L) 3.87 - 5.11 MIL/uL   Hemoglobin 7.4 (L) 12.0 - 15.0 g/dL   HCT 22.8 (L) 36 - 46 %   MCV 98.7 80.0 - 100.0 fL   MCH 32.0 26.0 - 34.0 pg   MCHC 32.5 30.0 - 36.0 g/dL   RDW 16.2 (H) 11.5 - 15.5 %   Platelets 212 150 - 400 K/uL   nRBC 0.8 (H) 0.0 - 0.2 %   Neutrophils Relative % 60 %   Neutro Abs 6.4 1.7 - 7.7 K/uL   Lymphocytes Relative 24 %   Lymphs Abs 2.5 0.7 - 4.0 K/uL   Monocytes Relative 9 %   Monocytes Absolute 1.0 0 - 1 K/uL   Eosinophils Relative 4 %   Eosinophils Absolute 0.4 0 - 0 K/uL   Basophils Relative 1 %   Basophils Absolute 0.1 0 - 0 K/uL   Immature Granulocytes 2 %   Abs Immature Granulocytes 0.20 (H) 0.00 - 0.07 K/uL    Comment: Performed at [facility], [address] [place], [place] [address]  Basic metabolic panel     Status: Abnormal   Collection Time: 01/14/20  9:58 AM  Result Value Ref Range   Sodium 142 135 - 145 mmol/L   Potassium 4.1 3.5 - 5.1 mmol/L   Chloride 110 98 - 111 mmol/L   CO2 23 22 - 32 mmol/L   Glucose, Bld 134 (H) 70 - 99 mg/dL    Comment: Glucose reference range applies only to samples taken after fasting for at least 8 hours.   BUN 17 8 - 23 mg/dL   Creatinine, Ser 0.75 0.44 - 1.00 mg/dL   Calcium 8.0 (L) 8.9 - 10.3 mg/dL   GFR calc non Af Amer >60 >60 mL/min   GFR calc Af Amer >60 >60 mL/min   Anion gap 9 5 - 15    Comment: Performed at [facility] [address] [place], [place] [address]  Prepare RBC (crossmatch)     Status: None   Collection Time: 01/14/20 10:39 AM  Result Value Ref Range   Order Confirmation      BB SAMPLE OR UNITS ALREADY AVAILABLE Performed at [facility], [address], [place], [place] [address]   Glucose, capillary     Status: Abnormal   Collection Time: 01/14/20 11:07 AM  Result Value Ref Range   Glucose-Capillary 120 (H) 70 - 99 mg/dL    Comment: Glucose reference range applies only to samples taken after fasting for at least 8 hours.   Comment 1 Notify RN    CT CHEST W CONTRAST  Result Date: 01/13/2020 CLINICAL DATA:  Staging of gastrointestinal cancer. EXAM: CT CHEST, ABDOMEN, AND PELVIS WITH CONTRAST TECHNIQUE: Multidetector CT imaging of the chest, abdomen and pelvis was performed following the standard protocol during bolus administration of intravenous contrast. CONTRAST:  19mL OMNIPAQUE IOHEXOL 350 MG/ML SOLN COMPARISON:  None. FINDINGS: CT CHEST FINDINGS Cardiovascular: There is moderate severity calcification of the  aortic arch. Normal heart size. No pericardial effusion. Mediastinum/Nodes: No enlarged mediastinal, hilar, or axillary lymph nodes. The thyroid gland and trachea demonstrate no significant findings. A small hiatal hernia is seen.  Lungs/Pleura: Lungs are clear. No pleural effusion or pneumothorax. Musculoskeletal: A 2.1 cm x 1.1 cm well-defined area of low attenuation is seen within the soft tissues of the left breast (axial CT image 28, CT series number 3). A 6.5 mm sclerotic focus is seen within the posterior aspect of the T7 vertebral body. CT ABDOMEN PELVIS FINDINGS Hepatobiliary: No focal liver abnormality is seen. Status post cholecystectomy. No biliary dilatation. Pancreas: Unremarkable. No pancreatic ductal dilatation or surrounding inflammatory changes. Spleen: Normal in size without focal abnormality. Adrenals/Urinary Tract: Adrenal glands are unremarkable. Kidneys are normal in size, without renal calculi or hydronephrosis. A 1.1 cm cyst is seen along the anteromedial aspect of the lower pole of the right kidney. Bladder is unremarkable. Stomach/Bowel: There is a small hiatal hernia. The appendix is not identified. No evidence of bowel wall thickening, distention, or inflammatory changes. Vascular/Lymphatic: There is mild calcification of the abdominal aorta and bilateral common iliac arteries, without evidence of aneurysmal dilatation or dissection. No enlarged abdominal or pelvic lymph nodes. Reproductive: Status post hysterectomy. No adnexal masses. Other: There is a 2.6 cm x 2.0 cm fat containing umbilical hernia. No abdominopelvic ascites. Musculoskeletal: No acute or significant osseous findings. IMPRESSION: 1. 2.1 cm x 1.1 cm well-defined area of low attenuation within the soft tissues of the left breast. This may represent a lymph node, however, correlation with mammography is recommended. 2. 6.5 mm sclerotic focus within the posterior aspect of the T7 vertebral body. This may represent a bone island, however, correlation with follow-up abdomen pelvis CT is recommended to exclude the presence of a metastatic lesion. 3. Small hiatal hernia. 4. 2.6 cm x 2.0 cm fat containing umbilical hernia. 5. Aortic atherosclerosis. Aortic  Atherosclerosis (ICD10-I70.0). Electronically Signed   By: Virgina Norfolk M.D.   On: 01/13/2020 18:18   CT ABDOMEN PELVIS W CONTRAST  Result Date: 01/13/2020 CLINICAL DATA:  Gastrointestinal cancer staging. EXAM: CT CHEST, ABDOMEN, AND PELVIS WITH CONTRAST TECHNIQUE: Multidetector CT imaging of the chest, abdomen and pelvis was performed following the standard protocol during bolus administration of intravenous contrast. CONTRAST:  178mL OMNIPAQUE IOHEXOL 350 MG/ML SOLN COMPARISON:  None. FINDINGS: CT CHEST FINDINGS Cardiovascular: There is moderate severity calcification of the aortic arch. Normal heart size. No pericardial effusion. Mediastinum/Nodes: No enlarged mediastinal, hilar, or axillary lymph nodes. The thyroid gland and trachea demonstrate no significant findings. A small hiatal hernia is seen. Lungs/Pleura: Lungs are clear. No pleural effusion or pneumothorax. Musculoskeletal: A 2.1 cm x 1.1 cm well-defined area of low attenuation is seen within the soft tissues of the left breast (axial CT image 28, CT series number 3). A 6.5 mm sclerotic focus is seen within the posterior aspect of the T7 vertebral body. CT ABDOMEN PELVIS FINDINGS Hepatobiliary: No focal liver abnormality is seen. Status post cholecystectomy. No biliary dilatation. Pancreas: Unremarkable. No pancreatic ductal dilatation or surrounding inflammatory changes. Spleen: Normal in size without focal abnormality. Adrenals/Urinary Tract: Adrenal glands are unremarkable. Kidneys are normal in size, without renal calculi or hydronephrosis. A 1.1 cm cyst is seen along the anteromedial aspect of the lower pole of the right kidney. Bladder is unremarkable. Stomach/Bowel: There is a small hiatal hernia. The appendix is not identified. No evidence of bowel wall thickening, distention, or inflammatory changes. Vascular/Lymphatic: There is mild calcification of the  abdominal aorta and bilateral common iliac arteries, without evidence of aneurysmal  dilatation or dissection. No enlarged abdominal or pelvic lymph nodes. Reproductive: Status post hysterectomy. No adnexal masses. Other: There is a 2.6 cm x 2.0 cm fat containing umbilical hernia. No abdominopelvic ascites. Musculoskeletal: No acute or significant osseous findings. IMPRESSION: 1. 2.1 cm x 1.1 cm well-defined area of low attenuation within the soft tissues of the left breast. This may represent a lymph node, however, correlation with mammography is recommended. 2. 6.5 mm sclerotic focus within the posterior aspect of the T7 vertebral body. This may represent a bone island, however, correlation with follow-up abdomen pelvis CT is recommended to exclude the presence of a metastatic lesion. 3. Small hiatal hernia. 4. 2.6 cm x 2.0 cm fat containing umbilical hernia. 5. Aortic atherosclerosis. Electronically Signed   By: Virgina Norfolk M.D.   On: 01/13/2020 18:20   Anti-infectives (From admission, onward)   None     Assessment/Plan HTN HLD DM2  GI bleed / malignant appearing gastric mass on EGD yesterday ABL Anemia  - EGD 8/5 showed an ulcerated non-circumferental mass along the greater curvature of the stomach, with the proximal edge about 5-6cm from the GE junction and the mass was 7-8cm long, 2-3cm wide. The mass was friable with spontaneous oozing. It was biopsied - Path is still pending.  - Non-obstructing, tolerating cld. Adv to FLD - Hgb stable at 7.4. S/p 1U PRBC yesterday. GI has ordered an additional unit today - No indication for emergency surgery at this time.  - Will arrange follow up in the office   2.1 cm x 1.1 cm well-defined area of low attenuation within the soft tissues of the left breast - Patient has not had a mammography in >10 years. She will need mammography as outpatient 6.5 mm sclerotic focus within the posterior aspect of the T7 vertebral body - possible metastasis.   FEN - FLD VTE - SCDs ID - None  Plan - No plans for surgery as an inpatient. Will  arrange follow up with Dr. Barry Dienes. Await Bx's. Patient will need follow up with Oncology if positive for malignancy. Patient will also need referral for mammogram to further investigate abnormality seen on CT of her left breast (I have asked the office to put in a referral). If patients hgb is stable and she tolerates FLD, she is okay for d/c from our standpoint.    Jillyn Ledger, [facility] Surgery 01/14/2020, 12:02 PM Please see Amion for pager number during day hours 7:00am-4:30pm

## 2020-01-14 NOTE — Discharge Instructions (Signed)

## 2020-01-14 NOTE — Progress Notes (Addendum)
Progress Note  CC:   GI bleed, gastric mass       ASSESSMENT AND PLAN:   # GI bleed / malignant appearing gastric mass on EGD yesterday --Biopsies still pending.  --Had a couple of black BMs this am but she says stools firming and she feels bleeding has slowed down.  --Hgb stable overnight but still low at 7.9. We requested another unit of blood last yesterday but not transfused. Even though hgb stable will order another unit of blood, especially since she may require surgery.  --I have consulted General Surgery.  --Continue BID PPI       SUBJECTIVE   No abdominal pain. No nausea. Had a couple of black BMs this am ( more solid than yesterday)    OBJECTIVE:     Vital signs in last 24 hours: Temp:  [98.3 F (36.8 C)-99.5 F (37.5 C)] 99 F (37.2 C) (08/06 0607) Pulse Rate:  [76-87] 79 (08/06 0607) Resp:  [15-22] 20 (08/06 0607) BP: (104-148)/(46-65) 126/58 (08/06 0607) SpO2:  [95 %-100 %] 95 % (08/06 0607) Weight:  [93.2 kg] 93.2 kg (08/05 1857) Last BM Date: 01/13/20 General:   Alert, in NAD Heart:  Regular rate and rhythm.  No lower extremity edema   Pulm: Normal respiratory effort   Abdomen:  Soft,  nontender, nondistended.  Normal bowel sounds.          Neurologic:  Alert and  oriented,  grossly normal neurologically. Psych:  Pleasant, cooperative.  Normal mood and affect.   Lab Results: Recent Labs    01/11/20 1436 01/12/20 1608 01/13/20 1220  WBC 15.4* 13.4* 13.9*  HGB 9.2* 7.9* 7.9*  HCT 28.1* 25.4* 25.0*  PLT 258 240 203   BMET Recent Labs    01/11/20 1436 01/12/20 1608  NA 141 140  K 4.1 4.0  CL 104 104  CO2 23 26  GLUCOSE 121* 138*  BUN 35* 34*  CREATININE 0.81 0.86  CALCIUM 9.7 9.0   LFT Recent Labs    01/11/20 1436 01/11/20 1436 01/12/20 1608  PROT 6.7   < > 6.5  ALBUMIN  --   --  3.8  AST 17   < > 25  ALT 15   < > 24  ALKPHOS  --   --  37*  BILITOT 0.4   < > 0.7  BILIDIR 0.1  --   --   IBILI 0.3  --   --    < > =  values in this interval not displayed.   PT/INR Recent Labs    01/11/20 1436  LABPROT 10.1  INR 0.9   Hepatitis Panel No results for input(s): HEPBSAG, HCVAB, HEPAIGM, HEPBIGM in the last 72 hours.  CT CHEST W CONTRAST  Result Date: 01/13/2020 CLINICAL DATA:  Staging of gastrointestinal cancer. EXAM: CT CHEST, ABDOMEN, AND PELVIS WITH CONTRAST TECHNIQUE: Multidetector CT imaging of the chest, abdomen and pelvis was performed following the standard protocol during bolus administration of intravenous contrast. CONTRAST:  166mL OMNIPAQUE IOHEXOL 350 MG/ML SOLN COMPARISON:  None. FINDINGS: CT CHEST FINDINGS Cardiovascular: There is moderate severity calcification of the aortic arch. Normal heart size. No pericardial effusion. Mediastinum/Nodes: No enlarged mediastinal, hilar, or axillary lymph nodes. The thyroid gland and trachea demonstrate no significant findings. A small hiatal hernia is seen. Lungs/Pleura: Lungs are clear. No pleural effusion or pneumothorax. Musculoskeletal: A 2.1 cm x 1.1 cm well-defined area of low attenuation is seen within the soft tissues of the left  breast (axial CT image 28, CT series number 3). A 6.5 mm sclerotic focus is seen within the posterior aspect of the T7 vertebral body. CT ABDOMEN PELVIS FINDINGS Hepatobiliary: No focal liver abnormality is seen. Status post cholecystectomy. No biliary dilatation. Pancreas: Unremarkable. No pancreatic ductal dilatation or surrounding inflammatory changes. Spleen: Normal in size without focal abnormality. Adrenals/Urinary Tract: Adrenal glands are unremarkable. Kidneys are normal in size, without renal calculi or hydronephrosis. A 1.1 cm cyst is seen along the anteromedial aspect of the lower pole of the right kidney. Bladder is unremarkable. Stomach/Bowel: There is a small hiatal hernia. The appendix is not identified. No evidence of bowel wall thickening, distention, or inflammatory changes. Vascular/Lymphatic: There is mild  calcification of the abdominal aorta and bilateral common iliac arteries, without evidence of aneurysmal dilatation or dissection. No enlarged abdominal or pelvic lymph nodes. Reproductive: Status post hysterectomy. No adnexal masses. Other: There is a 2.6 cm x 2.0 cm fat containing umbilical hernia. No abdominopelvic ascites. Musculoskeletal: No acute or significant osseous findings. IMPRESSION: 1. 2.1 cm x 1.1 cm well-defined area of low attenuation within the soft tissues of the left breast. This may represent a lymph node, however, correlation with mammography is recommended. 2. 6.5 mm sclerotic focus within the posterior aspect of the T7 vertebral body. This may represent a bone island, however, correlation with follow-up abdomen pelvis CT is recommended to exclude the presence of a metastatic lesion. 3. Small hiatal hernia. 4. 2.6 cm x 2.0 cm fat containing umbilical hernia. 5. Aortic atherosclerosis. Aortic Atherosclerosis (ICD10-I70.0). Electronically Signed   By: Virgina Norfolk M.D.   On: 01/13/2020 18:18   CT ABDOMEN PELVIS W CONTRAST  Result Date: 01/13/2020 CLINICAL DATA:  Gastrointestinal cancer staging. EXAM: CT CHEST, ABDOMEN, AND PELVIS WITH CONTRAST TECHNIQUE: Multidetector CT imaging of the chest, abdomen and pelvis was performed following the standard protocol during bolus administration of intravenous contrast. CONTRAST:  153mL OMNIPAQUE IOHEXOL 350 MG/ML SOLN COMPARISON:  None. FINDINGS: CT CHEST FINDINGS Cardiovascular: There is moderate severity calcification of the aortic arch. Normal heart size. No pericardial effusion. Mediastinum/Nodes: No enlarged mediastinal, hilar, or axillary lymph nodes. The thyroid gland and trachea demonstrate no significant findings. A small hiatal hernia is seen. Lungs/Pleura: Lungs are clear. No pleural effusion or pneumothorax. Musculoskeletal: A 2.1 cm x 1.1 cm well-defined area of low attenuation is seen within the soft tissues of the left breast (axial  CT image 28, CT series number 3). A 6.5 mm sclerotic focus is seen within the posterior aspect of the T7 vertebral body. CT ABDOMEN PELVIS FINDINGS Hepatobiliary: No focal liver abnormality is seen. Status post cholecystectomy. No biliary dilatation. Pancreas: Unremarkable. No pancreatic ductal dilatation or surrounding inflammatory changes. Spleen: Normal in size without focal abnormality. Adrenals/Urinary Tract: Adrenal glands are unremarkable. Kidneys are normal in size, without renal calculi or hydronephrosis. A 1.1 cm cyst is seen along the anteromedial aspect of the lower pole of the right kidney. Bladder is unremarkable. Stomach/Bowel: There is a small hiatal hernia. The appendix is not identified. No evidence of bowel wall thickening, distention, or inflammatory changes. Vascular/Lymphatic: There is mild calcification of the abdominal aorta and bilateral common iliac arteries, without evidence of aneurysmal dilatation or dissection. No enlarged abdominal or pelvic lymph nodes. Reproductive: Status post hysterectomy. No adnexal masses. Other: There is a 2.6 cm x 2.0 cm fat containing umbilical hernia. No abdominopelvic ascites. Musculoskeletal: No acute or significant osseous findings. IMPRESSION: 1. 2.1 cm x 1.1 cm well-defined area of low  attenuation within the soft tissues of the left breast. This may represent a lymph node, however, correlation with mammography is recommended. 2. 6.5 mm sclerotic focus within the posterior aspect of the T7 vertebral body. This may represent a bone island, however, correlation with follow-up abdomen pelvis CT is recommended to exclude the presence of a metastatic lesion. 3. Small hiatal hernia. 4. 2.6 cm x 2.0 cm fat containing umbilical hernia. 5. Aortic atherosclerosis. Electronically Signed   By: Virgina Norfolk M.D.   On: 01/13/2020 18:20      LOS: 0 days   Bridget Grant ,NP 01/14/2020, 10:27  AM   ________________________________________________________________________  Velora Heckler GI MD note:  I personally examined the patient, reviewed the data and agree with the assessment and plan described above.  EGD yesterday, see full report in Epic. She has a bleeding mass in her stomach. Endoscopically this is either adenocarcinoma, MALToma, perhaps metastatic lesion (does have abnormal left breast) and it has caused her to lose signficant blood in the past month, mostly in the past week (Hb last month 14.2, admitting HB 8/3 was 9.2, received 2 units PRBC and her HB today is 7.9).  THere are no good endoscopic options to stop the bleeding. The mass was extensively biopsied, path hopefully will be back soon (probably Monday however).  CT scan showed abnormal T7, abnormal left breast.   I have asked general surgery to see her today.  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 Perhaps social work can offer some help with that?  Owens Loffler, MD [facility] Pager [phone]

## 2020-01-14 NOTE — Progress Notes (Signed)
PROGRESS NOTE    Bridget Grant  PPJ:[id number] DOB: [birth year] DOA: 01/12/2020 PCP: [name], MD   Brief Narrative:  HPI: Bridget Grant is a 74 y.o. female with medical history significant of hypertension, hyperlipidemia, non-insulin-dependent type 2 diabetes, anxiety, depression presenting with complaints of hematemesis and melena.  She was sent to the ED by her PCP as lab work revealed hemoglobin of 9.2, down from 14.2 a month ago.  Patient states 3 days ago she had pizza and soon after vomited blood.  She then had another episode of vomiting blood at the same day.  Since then the vomiting has resolved but she has had dark tarry loose stools.  Reports having generalized weakness.  Denies abdominal pain.  Denies prior history of GI bleed.  She takes a baby aspirin daily but is not on any other blood thinners.  Denies recent NSAID use.  Denies alcohol use.  Patient has no other complaints.  She has been vaccinated against Covid.  Denies cough or shortness of breath.  Denies chest pain or dizziness.  ED Course: Hemodynamically stable on arrival.  WBC count mildly elevated at 13.4 and improved compared to labs done 2 days ago.  Hemoglobin 7.9.  BUN 34, creatinine 0.8.  LFTs normal.  FOBT pending.  SARS-CoV-2 PCR test pending.  IV PPI bolus and infusion ordered.  1 L fluid bolus ordered.  ED provider has sent a secure chat message to Dr. Watt Climes requesting GI consultation in the morning.  Assessment & Plan:   Principal Problem:   Upper GI bleed Active Problems:   Hyperlipidemia   Essential hypertension   Diabetes ([place])   Acute blood loss anemia  Acute blood loss anemia/upper GI bleeding/gastric tumor: Presented with hemoglobin of 7.9, down from 14.2 a month ago.  Patient received 1 unit of PRBC transfusion in the ED.  Despite of that, patient's hemoglobin dropped.  GI consulted.  Patient underwent EGD and found to have large friable gastric tumor which was biopsied.  Pathology results  are still pending.  Per GI recommendation, I had ordered another unit of blood transfusion yesterday but for some reason, this was not transfused.  Patient's hemoglobin has dropped further to 7.4 today.  GI has ordered another unit of transfusion and have consulted general surgery for possible surgical resection of her tumor.  Patient told me that she has a handicapped husband and she would prefer to go home and have all of the rest of the work-up as outpatient however per GI, there is not a good idea and she should have potential surgery as inpatient as her mass is very friable and she is at risk of having more bleeding especially that her hemoglobin is dropping and she had 2-3 more black bowel movement since yesterday.  Continue IV PPI.  Essential hypertension: Blood pressure within normal range.  Resume diltiazem but hold hydrochlorothiazide and lisinopril.  Hyperlipidemia: Resume statin.  Type 2 diabetes mellitus:Continue to hold oral hypoglycemic agents.  Continue SSI.  Blood sugar controlled.  DVT prophylaxis: SCDs Start: 01/13/20 0440   Code Status: Full Code  Family Communication:  None present at bedside.  Plan of care discussed with patient in length and he verbalized understanding and agreed with it.  Status is: Observation  The patient will require care spanning > 2 midnights and should be moved to inpatient because: Ongoing diagnostic testing needed not appropriate for outpatient work up  Dispo:  Patient From: Home  Planned Disposition: Home  Expected discharge  date: 01/14/20  Medically stable for discharge: No    Estimated body mass index is 37.58 kg/m as calculated from the following:   Height as of this encounter: 5\' 2"  (1.575 m).   Weight as of this encounter: 93.2 kg.      Nutritional status:               Consultants:   GI and general surgery  Procedures:   None  Antimicrobials:  Anti-infectives (From admission, onward)   None          Subjective: Patient seen and examined.  Feels better than yesterday.  No abdominal pain.  No hematemesis.  Had 2 bowel movements which were black according to patient.  Patient tells me that she has a handicapped husband and she prefers to go home and have further work-up as outpatient however per GI, she is at high risk of having bleeding and thus she should stay in the hospital and receive further care here.  Objective: Vitals:   01/13/20 1606 01/13/20 1857 01/14/20 0033 01/14/20 0607  BP: 121/61  118/65 (!) 126/58  Pulse: 78  79 79  Resp: 15  18 20   Temp: 98.4 F (36.9 C)  98.8 F (37.1 C) 99 F (37.2 C)  TempSrc: Oral  Oral Oral  SpO2: 100%  97% 95%  Weight:  93.2 kg    Height:  5\' 2"  (1.575 m)      Intake/Output Summary (Last 24 hours) at 01/14/2020 1101 Last data filed at 01/13/2020 1718 Gross per 24 hour  Intake 1055.18 ml  Output --  Net 1055.18 ml   Filed Weights   01/13/20 1857  Weight: 93.2 kg    Examination:  General exam: Appears calm and comfortable  Respiratory system: Clear to auscultation. Respiratory effort normal. Cardiovascular system: S1 & S2 heard, RRR. No JVD, murmurs, rubs, gallops or clicks. No pedal edema. Gastrointestinal system: Abdomen is nondistended, soft and nontender. No organomegaly or masses felt. Normal bowel sounds heard. Central nervous system: Alert and oriented. No focal neurological deficits. Extremities: Symmetric 5 x 5 power. Skin: No rashes, lesions or ulcers Psychiatry: Judgement and insight appear normal. Mood & affect appropriate.    Data Reviewed: I have personally reviewed following labs and imaging studies  CBC: Recent Labs  Lab 01/11/20 1436 01/12/20 1608 01/13/20 1220 01/14/20 0958  WBC 15.4* 13.4* 13.9* 10.6*  NEUTROABS 11,073*  --  9.5* 6.4  HGB 9.2* 7.9* 7.9* 7.4*  HCT 28.1* 25.4* 25.0* 22.8*  MCV 98.6 102.0* 97.7 98.7  PLT 258 240 203 387   Basic Metabolic Panel: Recent Labs  Lab 01/11/20 1436  01/12/20 1608  NA 141 140  K 4.1 4.0  CL 104 104  CO2 23 26  GLUCOSE 121* 138*  BUN 35* 34*  CREATININE 0.81 0.86  CALCIUM 9.7 9.0   GFR: Estimated Creatinine Clearance: 62.8 mL/min (by C-G formula based on SCr of 0.86 mg/dL). Liver Function Tests: Recent Labs  Lab 01/11/20 1436 01/12/20 1608  AST 17 25  ALT 15 24  ALKPHOS  --  37*  BILITOT 0.4 0.7  PROT 6.7 6.5  ALBUMIN  --  3.8   No results for input(s): LIPASE, AMYLASE in the last 168 hours. No results for input(s): AMMONIA in the last 168 hours. Coagulation Profile: Recent Labs  Lab 01/11/20 1436  INR 0.9   Cardiac Enzymes: No results for input(s): CKTOTAL, CKMB, CKMBINDEX, TROPONINI in the last 168 hours. BNP (last 3 results) No results for  input(s): PROBNP in the last 8760 hours. HbA1C: No results for input(s): HGBA1C in the last 72 hours. CBG: Recent Labs  Lab 01/13/20 0544 01/13/20 1151 01/13/20 1604 01/13/20 2143 01/14/20 0613  GLUCAP 158* 145* 109* 123* 121*   Lipid Profile: No results for input(s): CHOL, HDL, LDLCALC, TRIG, CHOLHDL, LDLDIRECT in the last 72 hours. Thyroid Function Tests: No results for input(s): TSH, T4TOTAL, FREET4, T3FREE, THYROIDAB in the last 72 hours. Anemia Panel: No results for input(s): VITAMINB12, FOLATE, FERRITIN, TIBC, IRON, RETICCTPCT in the last 72 hours. Sepsis Labs: No results for input(s): PROCALCITON, LATICACIDVEN in the last 168 hours.  Recent Results (from the past 240 hour(s))  SARS Coronavirus 2 by RT PCR (hospital order, performed in [facility] hospital lab) Nasopharyngeal Nasopharyngeal Swab     Status: None   Collection Time: 01/13/20  5:59 AM   Specimen: Nasopharyngeal Swab  Result Value Ref Range Status   SARS Coronavirus 2 NEGATIVE NEGATIVE Final    Comment: (NOTE) SARS-CoV-2 target nucleic acids are NOT DETECTED.  The SARS-CoV-2 RNA is generally detectable in upper and lower respiratory specimens during the acute phase of infection. The  lowest concentration of SARS-CoV-2 viral copies this assay can detect is 250 copies / mL. A negative result does not preclude SARS-CoV-2 infection and should not be used as the sole basis for treatment or other patient management decisions.  A negative result may occur with improper specimen collection / handling, submission of specimen other than nasopharyngeal swab, presence of viral mutation(s) within the areas targeted by this assay, and inadequate number of viral copies (<250 copies / mL). A negative result must be combined with clinical observations, patient history, and epidemiological information.  Fact Sheet for Patients:   StrictlyIdeas.no  Fact Sheet for Healthcare Providers: BankingDealers.co.za  This test is not yet approved or  cleared by the [place] FDA and has been authorized for detection and/or diagnosis of SARS-CoV-2 by FDA under an Emergency Use Authorization (EUA).  This EUA will remain in effect (meaning this test can be used) for the duration of the COVID-19 declaration under Section 564(b)(1) of the Act, 21 U.S.C. section 360bbb-3(b)(1), unless the authorization is terminated or revoked sooner.  Performed at [facility], [place] [address], [place], [place] [address]       Radiology Studies: CT CHEST W CONTRAST  Result Date: 01/13/2020 CLINICAL DATA:  Staging of gastrointestinal cancer. EXAM: CT CHEST, ABDOMEN, AND PELVIS WITH CONTRAST TECHNIQUE: Multidetector CT imaging of the chest, abdomen and pelvis was performed following the standard protocol during bolus administration of intravenous contrast. CONTRAST:  115mL OMNIPAQUE IOHEXOL 350 MG/ML SOLN COMPARISON:  None. FINDINGS: CT CHEST FINDINGS Cardiovascular: There is moderate severity calcification of the aortic arch. Normal heart size. No pericardial effusion. Mediastinum/Nodes: No enlarged mediastinal, hilar, or axillary lymph nodes. The thyroid gland  and trachea demonstrate no significant findings. A small hiatal hernia is seen. Lungs/Pleura: Lungs are clear. No pleural effusion or pneumothorax. Musculoskeletal: A 2.1 cm x 1.1 cm well-defined area of low attenuation is seen within the soft tissues of the left breast (axial CT image 28, CT series number 3). A 6.5 mm sclerotic focus is seen within the posterior aspect of the T7 vertebral body. CT ABDOMEN PELVIS FINDINGS Hepatobiliary: No focal liver abnormality is seen. Status post cholecystectomy. No biliary dilatation. Pancreas: Unremarkable. No pancreatic ductal dilatation or surrounding inflammatory changes. Spleen: Normal in size without focal abnormality. Adrenals/Urinary Tract: Adrenal glands are unremarkable. Kidneys are normal in size,  without renal calculi or hydronephrosis. A 1.1 cm cyst is seen along the anteromedial aspect of the lower pole of the right kidney. Bladder is unremarkable. Stomach/Bowel: There is a small hiatal hernia. The appendix is not identified. No evidence of bowel wall thickening, distention, or inflammatory changes. Vascular/Lymphatic: There is mild calcification of the abdominal aorta and bilateral common iliac arteries, without evidence of aneurysmal dilatation or dissection. No enlarged abdominal or pelvic lymph nodes. Reproductive: Status post hysterectomy. No adnexal masses. Other: There is a 2.6 cm x 2.0 cm fat containing umbilical hernia. No abdominopelvic ascites. Musculoskeletal: No acute or significant osseous findings. IMPRESSION: 1. 2.1 cm x 1.1 cm well-defined area of low attenuation within the soft tissues of the left breast. This may represent a lymph node, however, correlation with mammography is recommended. 2. 6.5 mm sclerotic focus within the posterior aspect of the T7 vertebral body. This may represent a bone island, however, correlation with follow-up abdomen pelvis CT is recommended to exclude the presence of a metastatic lesion. 3. Small hiatal hernia. 4.  2.6 cm x 2.0 cm fat containing umbilical hernia. 5. Aortic atherosclerosis. Aortic Atherosclerosis (ICD10-I70.0). Electronically Signed   By: Virgina Norfolk M.D.   On: 01/13/2020 18:18   CT ABDOMEN PELVIS W CONTRAST  Result Date: 01/13/2020 CLINICAL DATA:  Gastrointestinal cancer staging. EXAM: CT CHEST, ABDOMEN, AND PELVIS WITH CONTRAST TECHNIQUE: Multidetector CT imaging of the chest, abdomen and pelvis was performed following the standard protocol during bolus administration of intravenous contrast. CONTRAST:  125mL OMNIPAQUE IOHEXOL 350 MG/ML SOLN COMPARISON:  None. FINDINGS: CT CHEST FINDINGS Cardiovascular: There is moderate severity calcification of the aortic arch. Normal heart size. No pericardial effusion. Mediastinum/Nodes: No enlarged mediastinal, hilar, or axillary lymph nodes. The thyroid gland and trachea demonstrate no significant findings. A small hiatal hernia is seen. Lungs/Pleura: Lungs are clear. No pleural effusion or pneumothorax. Musculoskeletal: A 2.1 cm x 1.1 cm well-defined area of low attenuation is seen within the soft tissues of the left breast (axial CT image 28, CT series number 3). A 6.5 mm sclerotic focus is seen within the posterior aspect of the T7 vertebral body. CT ABDOMEN PELVIS FINDINGS Hepatobiliary: No focal liver abnormality is seen. Status post cholecystectomy. No biliary dilatation. Pancreas: Unremarkable. No pancreatic ductal dilatation or surrounding inflammatory changes. Spleen: Normal in size without focal abnormality. Adrenals/Urinary Tract: Adrenal glands are unremarkable. Kidneys are normal in size, without renal calculi or hydronephrosis. A 1.1 cm cyst is seen along the anteromedial aspect of the lower pole of the right kidney. Bladder is unremarkable. Stomach/Bowel: There is a small hiatal hernia. The appendix is not identified. No evidence of bowel wall thickening, distention, or inflammatory changes. Vascular/Lymphatic: There is mild calcification of the  abdominal aorta and bilateral common iliac arteries, without evidence of aneurysmal dilatation or dissection. No enlarged abdominal or pelvic lymph nodes. Reproductive: Status post hysterectomy. No adnexal masses. Other: There is a 2.6 cm x 2.0 cm fat containing umbilical hernia. No abdominopelvic ascites. Musculoskeletal: No acute or significant osseous findings. IMPRESSION: 1. 2.1 cm x 1.1 cm well-defined area of low attenuation within the soft tissues of the left breast. This may represent a lymph node, however, correlation with mammography is recommended. 2. 6.5 mm sclerotic focus within the posterior aspect of the T7 vertebral body. This may represent a bone island, however, correlation with follow-up abdomen pelvis CT is recommended to exclude the presence of a metastatic lesion. 3. Small hiatal hernia. 4. 2.6 cm x 2.0 cm fat containing  umbilical hernia. 5. Aortic atherosclerosis. Electronically Signed   By: Virgina Norfolk M.D.   On: 01/13/2020 18:20    Scheduled Meds: . sodium chloride   Intravenous Once  . insulin aspart  0-9 Units Subcutaneous TID AC & HS  . pantoprazole (PROTONIX) IV  40 mg Intravenous Q12H   Continuous Infusions:   LOS: 0 days   Time spent: 34 minutes   Darliss Cheney, MD [facility]  01/14/2020, 11:01 AM   To contact the attending provider between 7A-7P or the covering provider during after hours 7P-7A, please log into the web site [email or web address].

## 2020-01-15 LAB — CBC WITH DIFFERENTIAL/PLATELET
Abs Immature Granulocytes: 0.15 10*3/uL — ABNORMAL HIGH (ref 0.00–0.07)
Basophils Absolute: 0.1 10*3/uL (ref 0.0–0.1)
Basophils Relative: 1 %
Eosinophils Absolute: 0.4 10*3/uL (ref 0.0–0.5)
Eosinophils Relative: 3 %
HCT: 28.2 % — ABNORMAL LOW (ref 36.0–46.0)
Hemoglobin: 9.1 g/dL — ABNORMAL LOW (ref 12.0–15.0)
Immature Granulocytes: 1 %
Lymphocytes Relative: 21 %
Lymphs Abs: 2.3 10*3/uL (ref 0.7–4.0)
MCH: 31.5 pg (ref 26.0–34.0)
MCHC: 32.3 g/dL (ref 30.0–36.0)
MCV: 97.6 fL (ref 80.0–100.0)
Monocytes Absolute: 1 10*3/uL (ref 0.1–1.0)
Monocytes Relative: 9 %
Neutro Abs: 7.3 10*3/uL (ref 1.7–7.7)
Neutrophils Relative %: 65 %
Platelets: 236 10*3/uL (ref 150–400)
RBC: 2.89 MIL/uL — ABNORMAL LOW (ref 3.87–5.11)
RDW: 16.2 % — ABNORMAL HIGH (ref 11.5–15.5)
WBC: 11.2 10*3/uL — ABNORMAL HIGH (ref 4.0–10.5)
nRBC: 0.4 % — ABNORMAL HIGH (ref 0.0–0.2)

## 2020-01-15 LAB — GLUCOSE, CAPILLARY
Glucose-Capillary: 129 mg/dL — ABNORMAL HIGH (ref 70–99)
Glucose-Capillary: 126 mg/dL — ABNORMAL HIGH (ref 70–99)
Glucose-Capillary: 128 mg/dL — ABNORMAL HIGH (ref 70–99)
Glucose-Capillary: 153 mg/dL — ABNORMAL HIGH (ref 70–99)

## 2020-01-15 LAB — HEMOGLOBIN AND HEMATOCRIT, BLOOD
HCT: 26.5 % — ABNORMAL LOW (ref 36.0–46.0)
Hemoglobin: 8.6 g/dL — ABNORMAL LOW (ref 12.0–15.0)

## 2020-01-15 MED ORDER — PANTOPRAZOLE SODIUM 40 MG PO TBEC
40.0000 mg | DELAYED_RELEASE_TABLET | Freq: Two times a day (BID) | ORAL | Status: DC
  Administered 2020-01-15 – 2020-01-16 (×2): 40 mg via ORAL
  Filled 2020-01-15: qty 1

## 2020-01-15 MED ORDER — SODIUM CHLORIDE 0.9 % IV SOLN
510.0000 mg | Freq: Once | INTRAVENOUS | Status: AC
  Administered 2020-01-15: 510 mg via INTRAVENOUS
  Filled 2020-01-15: qty 17

## 2020-01-15 NOTE — Progress Notes (Signed)
[facility] Progress Note    Since last GI note: She had a mixed black/brown stool this AM.  Tolerating full liquids.    Objective: Vital signs in last 24 hours: Temp:  [97.9 F (36.6 C)-99.2 F (37.3 C)] 98.3 F (36.8 C) (08/07 0543) Pulse Rate:  [66-80] 66 (08/07 0543) Resp:  [16-20] 18 (08/07 0543) BP: (123-136)/(52-69) 123/58 (08/07 0543) SpO2:  [97 %-100 %] 97 % (08/07 0543) Last BM Date: 01/14/20 General: alert and oriented times 3 Heart: regular rate and rythm Abdomen: soft, non-tender, non-distended, normal bowel sounds   Lab Results: Recent Labs    01/13/20 1220 01/13/20 1220 01/14/20 0958 01/14/20 1938 01/15/20 0811  WBC 13.9*  --  10.6*  --  11.2*  HGB 7.9*   < > 7.4* 8.4* 9.1*  PLT 203  --  212  --  236  MCV 97.7  --  98.7  --  97.6   < > = values in this interval not displayed.   Recent Labs    01/12/20 1608 01/14/20 0958  NA 140 142  K 4.0 4.1  CL 104 110  CO2 26 23  GLUCOSE 138* 134*  BUN 34* 17  CREATININE 0.86 0.75  CALCIUM 9.0 8.0*   Recent Labs    01/12/20 1608  PROT 6.5  ALBUMIN 3.8  AST 25  ALT 24  ALKPHOS 37*  BILITOT 0.7   No results for input(s): INR in the last 72 hours.  Medications: Scheduled Meds: . diltiazem  240 mg Oral Daily  . insulin aspart  0-9 Units Subcutaneous TID AC & HS  . pantoprazole (PROTONIX) IV  40 mg Intravenous Q12H   Continuous Infusions: PRN Meds:.acetaminophen **OR** acetaminophen, ondansetron (ZOFRAN) IV    Assessment/Plan: 74 y.o. female with very friable malignant appearing mass in the stomach, left breast mass, T7 lesion  I am going to advance her diet to solid food, change her to PO PPI BID.  Will order iron infusion. If she is well for next 24 hours (without signs of significant ongoing bleeding) then she is probably safe for outpatient workup   Milus Banister, MD  01/15/2020, 10:19 AM [facility] Pager [phone]

## 2020-01-15 NOTE — Progress Notes (Signed)
PROGRESS NOTE    Bridget Grant  WGN:[id number] DOB: [birth year] DOA: 01/12/2020 PCP: [name], MD   Brief Narrative:  Bridget Grant is a 74 y.o. female with medical history significant of hypertension, hyperlipidemia, non-insulin-dependent type 2 diabetes, anxiety, depression presented with complaints of hematemesis and melena.  She was sent to the ED by her PCP as lab work revealed hemoglobin of 9.2, down from 14.2 a month ago.  Patient states 3 days prior to presentation, she had pizza and soon after vomited blood.  She then had another episode of vomiting blood at the same day.  Since then the vomiting has resolved but she has had dark tarry loose stools associated with generalized weakness.  Denies abdominal pain.  Denies prior history of GI bleed.  She takes a baby aspirin daily but is not on any other blood thinners.  Denied recent NSAID or alcohol use.  Upon arrival to ED, she was hemodynamically stable.  Her hemoglobin was 7.9.  COVID-19 negative.  Patient was admitted under hospitalist service. Patient received 1 unit of PRBC transfusion in the ED.  Despite of that, patient's hemoglobin dropped.  GI consulted.  Patient underwent EGD and found to have large friable gastric tumor which was biopsied.  Pathology results are still pending.  Patient's hemoglobin has dropped further to 7.4 on 01/14/2020 and she was transfused 1 more unit of blood.  She was continued on IV PPI.  General surgery was consulted by GI for potential inpatient surgery however they recommended outpatient mammogram and follow-up as outpatient for potential surgical consideration as outpatient if she turns out to be having carcinoma.    Assessment & Plan:   Principal Problem:   Upper GI bleed Active Problems:   Hyperlipidemia   Essential hypertension   Diabetes ([place])   Acute blood loss anemia   Gastric mass  Acute blood loss anemia/upper GI bleeding/gastric tumor: Presented with hemoglobin of 7.9, down from 14.2 a  month ago.  Patient received 1 unit of PRBC transfusion in the ED.  Despite of that, patient's hemoglobin dropped.  GI consulted.  Patient underwent EGD and found to have large friable gastric tumor which was biopsied.  Pathology results are still pending.  Per GI recommendation, I had ordered another unit of blood transfusion yesterday but for some reason, this was not transfused.  Patient's hemoglobin has dropped further to 7.4 on 01/14/2020 for which she received another clot of transfusion ordered by GI.  Her hemoglobin since then has been 8.4> 9.1.  General surgery consulted by GI.  They recommended outpatient mammogram and further work-up as outpatient and not inpatient.  Discussed in length with Dr. Ardis Hughs this morning.  Due to patient having had 2 bowel movements which were black in last 24 hours, GI recommends monitoring another overnight and discharging home by tomorrow if no further evidence of bleeding and hemoglobin remains a stable.  She is going to receive 1 dose of IV iron.  Per GI recommendation, I consulted oncology and was able to speak to Dr. Alen Blew.  Who after listening to the details of the patient stated that patient does not need to be seen by oncology on inpatient basis and that he will arrange outpatient follow-up for patient.  Essential hypertension: Blood pressure within normal range.  Continue diltiazem but hold hydrochlorothiazide and lisinopril.  Hyperlipidemia: Continue statin.  Type 2 diabetes mellitus:Continue to hold oral hypoglycemic agents.  Continue SSI.  Blood sugar controlled.  DVT prophylaxis: SCDs Start: 01/13/20 0440  Code Status: Full Code  Family Communication:  None present at bedside.  Plan of care discussed with patient in length and he verbalized understanding and agreed with it.   Status is: Inpatient  Remains inpatient appropriate because:Inpatient level of care appropriate due to severity of illness   Dispo:  Patient From: Home  Planned  Disposition: Home  Expected discharge date: 01/16/20  Medically stable for discharge: No      Estimated body mass index is 37.58 kg/m as calculated from the following:   Height as of this encounter: 5\' 2"  (1.575 m).   Weight as of this encounter: 93.2 kg.      Nutritional status:               Consultants:   GI and general surgery  Procedures:   None  Antimicrobials:  Anti-infectives (From admission, onward)   None         Subjective: Patient seen and examined.  She has no complaint.  She has had 2 bowel movements in last 24 hours and according to her, they were black but not as dark as they were before.  He still wishes to go home.  Objective: Vitals:   01/14/20 1800 01/15/20 0042 01/15/20 0543 01/15/20 1223  BP: (!) 131/54 125/69 (!) 123/58 (!) 116/51  Pulse: 72 72 66 68  Resp: 16 18 18 16   Temp: 99.2 F (37.3 C) 97.9 F (36.6 C) 98.3 F (36.8 C) 98.8 F (37.1 C)  TempSrc: Oral Oral Oral Oral  SpO2: 99% 100% 97% 98%  Weight:      Height:        Intake/Output Summary (Last 24 hours) at 01/15/2020 1317 Last data filed at 01/14/2020 2130 Gross per 24 hour  Intake 240 ml  Output --  Net 240 ml   Filed Weights   01/13/20 1857  Weight: 93.2 kg    Examination:  General exam: Appears calm and comfortable  Respiratory system: Clear to auscultation. Respiratory effort normal. Cardiovascular system: S1 & S2 heard, RRR. No JVD, murmurs, rubs, gallops or clicks. No pedal edema. Gastrointestinal system: Abdomen is nondistended, soft and nontender. No organomegaly or masses felt. Normal bowel sounds heard. Central nervous system: Alert and oriented. No focal neurological deficits. Extremities: Symmetric 5 x 5 power. Skin: No rashes, lesions or ulcers.  Psychiatry: Judgement and insight appear normal. Mood & affect appropriate.   Data Reviewed: I have personally reviewed following labs and imaging studies  CBC: Recent Labs  Lab 01/11/20 1436  01/11/20 1436 01/12/20 1608 01/13/20 1220 01/14/20 0958 01/14/20 1938 01/15/20 0811  WBC 15.4*  --  13.4* 13.9* 10.6*  --  11.2*  NEUTROABS 11,073*  --   --  9.5* 6.4  --  7.3  HGB 9.2*   < > 7.9* 7.9* 7.4* 8.4* 9.1*  HCT 28.1*   < > 25.4* 25.0* 22.8* 26.0* 28.2*  MCV 98.6  --  102.0* 97.7 98.7  --  97.6  PLT 258  --  240 203 212  --  236   < > = values in this interval not displayed.   Basic Metabolic Panel: Recent Labs  Lab 01/11/20 1436 01/12/20 1608 01/14/20 0958  NA 141 140 142  K 4.1 4.0 4.1  CL 104 104 110  CO2 23 26 23   GLUCOSE 121* 138* 134*  BUN 35* 34* 17  CREATININE 0.81 0.86 0.75  CALCIUM 9.7 9.0 8.0*   GFR: Estimated Creatinine Clearance: 67.5 mL/min (by C-G formula based on  SCr of 0.75 mg/dL). Liver Function Tests: Recent Labs  Lab 01/11/20 1436 01/12/20 1608  AST 17 25  ALT 15 24  ALKPHOS  --  37*  BILITOT 0.4 0.7  PROT 6.7 6.5  ALBUMIN  --  3.8   No results for input(s): LIPASE, AMYLASE in the last 168 hours. No results for input(s): AMMONIA in the last 168 hours. Coagulation Profile: Recent Labs  Lab 01/11/20 1436  INR 0.9   Cardiac Enzymes: No results for input(s): CKTOTAL, CKMB, CKMBINDEX, TROPONINI in the last 168 hours. BNP (last 3 results) No results for input(s): PROBNP in the last 8760 hours. HbA1C: No results for input(s): HGBA1C in the last 72 hours. CBG: Recent Labs  Lab 01/14/20 1107 01/14/20 1608 01/14/20 2122 01/15/20 0549 01/15/20 1116  GLUCAP 120* 125* 110* 129* 126*   Lipid Profile: No results for input(s): CHOL, HDL, LDLCALC, TRIG, CHOLHDL, LDLDIRECT in the last 72 hours. Thyroid Function Tests: No results for input(s): TSH, T4TOTAL, FREET4, T3FREE, THYROIDAB in the last 72 hours. Anemia Panel: No results for input(s): VITAMINB12, FOLATE, FERRITIN, TIBC, IRON, RETICCTPCT in the last 72 hours. Sepsis Labs: No results for input(s): PROCALCITON, LATICACIDVEN in the last 168 hours.  Recent Results (from the  past 240 hour(s))  SARS Coronavirus 2 by RT PCR (hospital order, performed in [facility] hospital lab) Nasopharyngeal Nasopharyngeal Swab     Status: None   Collection Time: 01/13/20  5:59 AM   Specimen: Nasopharyngeal Swab  Result Value Ref Range Status   SARS Coronavirus 2 NEGATIVE NEGATIVE Final    Comment: (NOTE) SARS-CoV-2 target nucleic acids are NOT DETECTED.  The SARS-CoV-2 RNA is generally detectable in upper and lower respiratory specimens during the acute phase of infection. The lowest concentration of SARS-CoV-2 viral copies this assay can detect is 250 copies / mL. A negative result does not preclude SARS-CoV-2 infection and should not be used as the sole basis for treatment or other patient management decisions.  A negative result may occur with improper specimen collection / handling, submission of specimen other than nasopharyngeal swab, presence of viral mutation(s) within the areas targeted by this assay, and inadequate number of viral copies (<250 copies / mL). A negative result must be combined with clinical observations, patient history, and epidemiological information.  Fact Sheet for Patients:   StrictlyIdeas.no  Fact Sheet for Healthcare Providers: BankingDealers.co.za  This test is not yet approved or  cleared by the [place] FDA and has been authorized for detection and/or diagnosis of SARS-CoV-2 by FDA under an Emergency Use Authorization (EUA).  This EUA will remain in effect (meaning this test can be used) for the duration of the COVID-19 declaration under Section 564(b)(1) of the Act, 21 U.S.C. section 360bbb-3(b)(1), unless the authorization is terminated or revoked sooner.  Performed at [facility], [place] [address] [place], [place] [address]       Radiology Studies: CT CHEST W CONTRAST  Result Date: 01/13/2020 CLINICAL DATA:  Staging of gastrointestinal cancer. EXAM: CT CHEST,  ABDOMEN, AND PELVIS WITH CONTRAST TECHNIQUE: Multidetector CT imaging of the chest, abdomen and pelvis was performed following the standard protocol during bolus administration of intravenous contrast. CONTRAST:  112mL OMNIPAQUE IOHEXOL 350 MG/ML SOLN COMPARISON:  None. FINDINGS: CT CHEST FINDINGS Cardiovascular: There is moderate severity calcification of the aortic arch. Normal heart size. No pericardial effusion. Mediastinum/Nodes: No enlarged mediastinal, hilar, or axillary lymph nodes. The thyroid gland and trachea demonstrate no significant findings. A small hiatal hernia is  seen. Lungs/Pleura: Lungs are clear. No pleural effusion or pneumothorax. Musculoskeletal: A 2.1 cm x 1.1 cm well-defined area of low attenuation is seen within the soft tissues of the left breast (axial CT image 28, CT series number 3). A 6.5 mm sclerotic focus is seen within the posterior aspect of the T7 vertebral body. CT ABDOMEN PELVIS FINDINGS Hepatobiliary: No focal liver abnormality is seen. Status post cholecystectomy. No biliary dilatation. Pancreas: Unremarkable. No pancreatic ductal dilatation or surrounding inflammatory changes. Spleen: Normal in size without focal abnormality. Adrenals/Urinary Tract: Adrenal glands are unremarkable. Kidneys are normal in size, without renal calculi or hydronephrosis. A 1.1 cm cyst is seen along the anteromedial aspect of the lower pole of the right kidney. Bladder is unremarkable. Stomach/Bowel: There is a small hiatal hernia. The appendix is not identified. No evidence of bowel wall thickening, distention, or inflammatory changes. Vascular/Lymphatic: There is mild calcification of the abdominal aorta and bilateral common iliac arteries, without evidence of aneurysmal dilatation or dissection. No enlarged abdominal or pelvic lymph nodes. Reproductive: Status post hysterectomy. No adnexal masses. Other: There is a 2.6 cm x 2.0 cm fat containing umbilical hernia. No abdominopelvic ascites.  Musculoskeletal: No acute or significant osseous findings. IMPRESSION: 1. 2.1 cm x 1.1 cm well-defined area of low attenuation within the soft tissues of the left breast. This may represent a lymph node, however, correlation with mammography is recommended. 2. 6.5 mm sclerotic focus within the posterior aspect of the T7 vertebral body. This may represent a bone island, however, correlation with follow-up abdomen pelvis CT is recommended to exclude the presence of a metastatic lesion. 3. Small hiatal hernia. 4. 2.6 cm x 2.0 cm fat containing umbilical hernia. 5. Aortic atherosclerosis. Aortic Atherosclerosis (ICD10-I70.0). Electronically Signed   By: Virgina Norfolk M.D.   On: 01/13/2020 18:18   CT ABDOMEN PELVIS W CONTRAST  Result Date: 01/13/2020 CLINICAL DATA:  Gastrointestinal cancer staging. EXAM: CT CHEST, ABDOMEN, AND PELVIS WITH CONTRAST TECHNIQUE: Multidetector CT imaging of the chest, abdomen and pelvis was performed following the standard protocol during bolus administration of intravenous contrast. CONTRAST:  125mL OMNIPAQUE IOHEXOL 350 MG/ML SOLN COMPARISON:  None. FINDINGS: CT CHEST FINDINGS Cardiovascular: There is moderate severity calcification of the aortic arch. Normal heart size. No pericardial effusion. Mediastinum/Nodes: No enlarged mediastinal, hilar, or axillary lymph nodes. The thyroid gland and trachea demonstrate no significant findings. A small hiatal hernia is seen. Lungs/Pleura: Lungs are clear. No pleural effusion or pneumothorax. Musculoskeletal: A 2.1 cm x 1.1 cm well-defined area of low attenuation is seen within the soft tissues of the left breast (axial CT image 28, CT series number 3). A 6.5 mm sclerotic focus is seen within the posterior aspect of the T7 vertebral body. CT ABDOMEN PELVIS FINDINGS Hepatobiliary: No focal liver abnormality is seen. Status post cholecystectomy. No biliary dilatation. Pancreas: Unremarkable. No pancreatic ductal dilatation or surrounding  inflammatory changes. Spleen: Normal in size without focal abnormality. Adrenals/Urinary Tract: Adrenal glands are unremarkable. Kidneys are normal in size, without renal calculi or hydronephrosis. A 1.1 cm cyst is seen along the anteromedial aspect of the lower pole of the right kidney. Bladder is unremarkable. Stomach/Bowel: There is a small hiatal hernia. The appendix is not identified. No evidence of bowel wall thickening, distention, or inflammatory changes. Vascular/Lymphatic: There is mild calcification of the abdominal aorta and bilateral common iliac arteries, without evidence of aneurysmal dilatation or dissection. No enlarged abdominal or pelvic lymph nodes. Reproductive: Status post hysterectomy. No adnexal masses. Other: There is  a 2.6 cm x 2.0 cm fat containing umbilical hernia. No abdominopelvic ascites. Musculoskeletal: No acute or significant osseous findings. IMPRESSION: 1. 2.1 cm x 1.1 cm well-defined area of low attenuation within the soft tissues of the left breast. This may represent a lymph node, however, correlation with mammography is recommended. 2. 6.5 mm sclerotic focus within the posterior aspect of the T7 vertebral body. This may represent a bone island, however, correlation with follow-up abdomen pelvis CT is recommended to exclude the presence of a metastatic lesion. 3. Small hiatal hernia. 4. 2.6 cm x 2.0 cm fat containing umbilical hernia. 5. Aortic atherosclerosis. Electronically Signed   By: Virgina Norfolk M.D.   On: 01/13/2020 18:20    Scheduled Meds: . diltiazem  240 mg Oral Daily  . insulin aspart  0-9 Units Subcutaneous TID AC & HS  . pantoprazole  40 mg Oral BID AC   Continuous Infusions: . ferumoxytol       LOS: 1 day   Time spent: 32 minutes   Darliss Cheney, MD [facility]  01/15/2020, 1:17 PM   To contact the attending provider between 7A-7P or the covering provider during after hours 7P-7A, please log into the web site [email or web address].

## 2020-01-16 LAB — TYPE AND SCREEN
ABO/RH(D): O POS
Antibody Screen: NEGATIVE
Unit division: 0
Unit division: 0
Unit division: 0

## 2020-01-16 LAB — CBC WITH DIFFERENTIAL/PLATELET
Abs Immature Granulocytes: 0.14 10*3/uL — ABNORMAL HIGH (ref 0.00–0.07)
Basophils Absolute: 0 10*3/uL (ref 0.0–0.1)
Basophils Relative: 0 %
Eosinophils Absolute: 0.3 10*3/uL (ref 0.0–0.5)
Eosinophils Relative: 3 %
HCT: 26.7 % — ABNORMAL LOW (ref 36.0–46.0)
Hemoglobin: 8.4 g/dL — ABNORMAL LOW (ref 12.0–15.0)
Immature Granulocytes: 1 %
Lymphocytes Relative: 30 %
Lymphs Abs: 3.1 10*3/uL (ref 0.7–4.0)
MCH: 30.7 pg (ref 26.0–34.0)
MCHC: 31.5 g/dL (ref 30.0–36.0)
MCV: 97.4 fL (ref 80.0–100.0)
Monocytes Absolute: 1 10*3/uL (ref 0.1–1.0)
Monocytes Relative: 9 %
Neutro Abs: 5.8 10*3/uL (ref 1.7–7.7)
Neutrophils Relative %: 57 %
Platelets: 240 10*3/uL (ref 150–400)
RBC: 2.74 MIL/uL — ABNORMAL LOW (ref 3.87–5.11)
RDW: 16 % — ABNORMAL HIGH (ref 11.5–15.5)
WBC: 10.3 10*3/uL (ref 4.0–10.5)
nRBC: 0.3 % — ABNORMAL HIGH (ref 0.0–0.2)

## 2020-01-16 LAB — GLUCOSE, CAPILLARY
Glucose-Capillary: 128 mg/dL — ABNORMAL HIGH (ref 70–99)
Glucose-Capillary: 146 mg/dL — ABNORMAL HIGH (ref 70–99)

## 2020-01-16 LAB — BPAM RBC
Blood Product Expiration Date: 202108312359
Blood Product Expiration Date: 202108312359
Blood Product Expiration Date: 202109012359
ISSUE DATE / TIME: 202108050712
ISSUE DATE / TIME: 202108061236
ISSUE DATE / TIME: 202108081018
Unit Type and Rh: 5100
Unit Type and Rh: 5100
Unit Type and Rh: 5100

## 2020-01-16 MED ORDER — PANTOPRAZOLE SODIUM 40 MG PO TBEC: 40.0000 mg | DELAYED_RELEASE_TABLET | Freq: Two times a day (BID) | ORAL | 0 refills | Status: DC

## 2020-01-16 NOTE — Discharge Summary (Signed)
Physician Discharge Summary  Bridget Grant:[id number] DOB: [birth year] DOA: 01/12/2020  PCP: Bridget Borg, MD  Admit date: 01/12/2020 Discharge date: 01/16/2020  Admitted From: Home Disposition: Home  Recommendations for Outpatient Follow-up:  1. Follow up with PCP in 1-2 weeks 2. Follow with GI within 1 week for pathology report 3. Follow-up with general surgery within 1 week to have further work-up such as scheduling mammogram 4. Follow-up with oncology within 1 week for further plan of care 5. Please obtain BMP/CBC in one week 6. Please follow up with your PCP on the following pending results: Unresulted Labs (From admission, onward) Comment          Start     Ordered   01/13/20 1033  CBC with Differential/Platelet  Daily,   R      01/13/20 Section: None Equipment/Devices: None  Discharge Condition: Stable CODE STATUS: Full code Diet recommendation: Cardiac  Subjective: Seen and examined.  She remains asymptomatic.  Eager to go home.  Brief/Interim Summary: Bridget Ricklefs Smithis a 74 y.o.femalewith medical history significant ofhypertension, hyperlipidemia, non-insulin-dependent type 2 diabetes, anxiety, depression presented with complaints of hematemesis and melena. She was sent to the ED by her PCP as lab work revealed hemoglobin of 9.2, down from 14.2 amonth ago.Patient states 3 days prior to presentation, she had pizza and soon after vomited blood. She then had another episode of vomiting blood at the same day. Since then the vomiting has resolved but she has had dark tarry loose stools associated with generalized weakness. Denied abdominal pain. Denied prior history of GI bleed. She takes a baby aspirin daily but is not on any other blood thinners (aspirin is not one of her home medications.  She is advised not to take it until she has more work-up for possible cancer as her tumor was apparently very friable per GI EGD report). Denied recent  NSAID or alcohol use.  Upon arrival to ED, she was hemodynamically stable.  Her hemoglobin was 7.9.  COVID-19 negative.  Patient was admitted under hospitalist service. Patient received 1 unit of PRBC transfusion in the ED.  Despite of that, patient's hemoglobin dropped.  GI consulted.  Patient underwent EGD and found to have large friable gastric tumor which was biopsied.  Pathology results are still pending.  Patient's hemoglobin had dropped further to 7.4 on 01/14/2020 and she was transfused 1 more unit of blood.  She was continued on IV PPI.  General surgery was consulted by GI for potential inpatient surgery however they recommended outpatient mammogram and follow-up as outpatient for potential surgical consideration as outpatient if she turns out to be having carcinoma.  Per GI recommendations, I also consulted with on-call oncologist Bridget Grant who after listening to the case said that patient does not need to be seen as inpatient and he recommended follow-up with outpatient.  He was provided with patient's information so he can expedite outpatient visit of the patient.  Posttransfusion on 01/14/2020, patient's hemoglobin remained stable.  She is now cleared by GI and patient has not had any further episode of black stool in last 24 hours so she is going to be discharged home.  She will be on omeprazole 40 mg p.o. twice daily.  GI will follow with her once they have pathology report available.  Discharge Diagnoses:  Principal Problem:   Upper GI bleed Active Problems:   Hyperlipidemia   Essential hypertension   Diabetes (  [name])   Acute blood loss anemia   Gastric mass    Discharge Instructions   Allergies as of 01/16/2020   No Known Allergies     Medication List    TAKE these medications   atorvastatin 20 MG tablet Commonly known as: LIPITOR TAKE 1 TABLET BY MOUTH EVERY DAY   diltiazem 240 MG 24 hr capsule Commonly known as: CARDIZEM CD TAKE 1 CAPSULE(240 MG) BY MOUTH DAILY What  changed: See the new instructions.   glipiZIDE 10 MG 24 hr tablet Commonly known as: GLUCOTROL XL TAKE 1 TABLET BY MOUTH DAILY WITH BREAKFAST   hydrochlorothiazide 12.5 MG capsule Commonly known as: Microzide Take 1 capsule (12.5 mg total) by mouth daily.   lisinopril 20 MG tablet Commonly known as: ZESTRIL TAKE 1 TABLET BY MOUTH EVERY DAY What changed: when to take this   metFORMIN 500 MG 24 hr tablet Commonly known as: GLUCOPHAGE-XR TAKE 4 TABLETS(2000 MG) BY MOUTH DAILY WITH BREAKFAST What changed: See the new instructions.   pantoprazole 40 MG tablet Commonly known as: PROTONIX Take 1 tablet (40 mg total) by mouth 2 (two) times daily before a meal.   pioglitazone 15 MG tablet Commonly known as: ACTOS Take 1 tablet (15 mg total) by mouth daily.   vitamin B-12 1000 MCG tablet Commonly known as: CYANOCOBALAMIN Take 1 tablet (1,000 mcg total) by mouth daily.   Vitamin D (Ergocalciferol) 1.25 MG (50000 UNIT) Caps capsule Commonly known as: DRISDOL Take 1 capsule (50,000 Units total) by mouth every 7 (seven) days. What changed: when to take this       Follow-up Information    Bridget Borg, MD. Schedule an appointment as soon as possible for a visit.   Specialties: Internal Medicine, Radiology Contact information: [place] [address] [phone]        Surgery, [place]. Go on 01/24/2020.   Specialty: General Surgery Why: Follow up with Dr. Barry Grant. 1045am. Please arrive 30 minutes prior to your appointment. Please bring a copy of your photo ID and insurance card.  Contact information: [address] [place] [address] [phone]        Bridget Portela, MD Follow up in 1 week(s).   Specialty: Oncology Contact information: [place] [address] [phone]              No Known Allergies  Consultations: GI, general surgery and oncology   Procedures/Studies: CT CHEST W  CONTRAST  Result Date: 01/13/2020 CLINICAL DATA:  Staging of gastrointestinal cancer. EXAM: CT CHEST, ABDOMEN, AND PELVIS WITH CONTRAST TECHNIQUE: Multidetector CT imaging of the chest, abdomen and pelvis was performed following the standard protocol during bolus administration of intravenous contrast. CONTRAST:  174mL OMNIPAQUE IOHEXOL 350 MG/ML SOLN COMPARISON:  None. FINDINGS: CT CHEST FINDINGS Cardiovascular: There is moderate severity calcification of the aortic arch. Normal heart size. No pericardial effusion. Mediastinum/Nodes: No enlarged mediastinal, hilar, or axillary lymph nodes. The thyroid gland and trachea demonstrate no significant findings. A small hiatal hernia is seen. Lungs/Pleura: Lungs are clear. No pleural effusion or pneumothorax. Musculoskeletal: A 2.1 cm x 1.1 cm well-defined area of low attenuation is seen within the soft tissues of the left breast (axial CT image 28, CT series number 3). A 6.5 mm sclerotic focus is seen within the posterior aspect of the T7 vertebral body. CT ABDOMEN PELVIS FINDINGS Hepatobiliary: No focal liver abnormality is seen. Status post cholecystectomy. No biliary dilatation. Pancreas: Unremarkable. No pancreatic  ductal dilatation or surrounding inflammatory changes. Spleen: Normal in size without focal abnormality. Adrenals/Urinary Tract: Adrenal glands are unremarkable. Kidneys are normal in size, without renal calculi or hydronephrosis. A 1.1 cm cyst is seen along the anteromedial aspect of the lower pole of the right kidney. Bladder is unremarkable. Stomach/Bowel: There is a small hiatal hernia. The appendix is not identified. No evidence of bowel wall thickening, distention, or inflammatory changes. Vascular/Lymphatic: There is mild calcification of the abdominal aorta and bilateral common iliac arteries, without evidence of aneurysmal dilatation or dissection. No enlarged abdominal or pelvic lymph nodes. Reproductive: Status post hysterectomy. No adnexal  masses. Other: There is a 2.6 cm x 2.0 cm fat containing umbilical hernia. No abdominopelvic ascites. Musculoskeletal: No acute or significant osseous findings. IMPRESSION: 1. 2.1 cm x 1.1 cm well-defined area of low attenuation within the soft tissues of the left breast. This may represent a lymph node, however, correlation with mammography is recommended. 2. 6.5 mm sclerotic focus within the posterior aspect of the T7 vertebral body. This may represent a bone island, however, correlation with follow-up abdomen pelvis CT is recommended to exclude the presence of a metastatic lesion. 3. Small hiatal hernia. 4. 2.6 cm x 2.0 cm fat containing umbilical hernia. 5. Aortic atherosclerosis. Aortic Atherosclerosis (ICD10-I70.0). Electronically Signed   By: Virgina Norfolk M.D.   On: 01/13/2020 18:18   CT ABDOMEN PELVIS W CONTRAST  Result Date: 01/13/2020 CLINICAL DATA:  Gastrointestinal cancer staging. EXAM: CT CHEST, ABDOMEN, AND PELVIS WITH CONTRAST TECHNIQUE: Multidetector CT imaging of the chest, abdomen and pelvis was performed following the standard protocol during bolus administration of intravenous contrast. CONTRAST:  139mL OMNIPAQUE IOHEXOL 350 MG/ML SOLN COMPARISON:  None. FINDINGS: CT CHEST FINDINGS Cardiovascular: There is moderate severity calcification of the aortic arch. Normal heart size. No pericardial effusion. Mediastinum/Nodes: No enlarged mediastinal, hilar, or axillary lymph nodes. The thyroid gland and trachea demonstrate no significant findings. A small hiatal hernia is seen. Lungs/Pleura: Lungs are clear. No pleural effusion or pneumothorax. Musculoskeletal: A 2.1 cm x 1.1 cm well-defined area of low attenuation is seen within the soft tissues of the left breast (axial CT image 28, CT series number 3). A 6.5 mm sclerotic focus is seen within the posterior aspect of the T7 vertebral body. CT ABDOMEN PELVIS FINDINGS Hepatobiliary: No focal liver abnormality is seen. Status post cholecystectomy.  No biliary dilatation. Pancreas: Unremarkable. No pancreatic ductal dilatation or surrounding inflammatory changes. Spleen: Normal in size without focal abnormality. Adrenals/Urinary Tract: Adrenal glands are unremarkable. Kidneys are normal in size, without renal calculi or hydronephrosis. A 1.1 cm cyst is seen along the anteromedial aspect of the lower pole of the right kidney. Bladder is unremarkable. Stomach/Bowel: There is a small hiatal hernia. The appendix is not identified. No evidence of bowel wall thickening, distention, or inflammatory changes. Vascular/Lymphatic: There is mild calcification of the abdominal aorta and bilateral common iliac arteries, without evidence of aneurysmal dilatation or dissection. No enlarged abdominal or pelvic lymph nodes. Reproductive: Status post hysterectomy. No adnexal masses. Other: There is a 2.6 cm x 2.0 cm fat containing umbilical hernia. No abdominopelvic ascites. Musculoskeletal: No acute or significant osseous findings. IMPRESSION: 1. 2.1 cm x 1.1 cm well-defined area of low attenuation within the soft tissues of the left breast. This may represent a lymph node, however, correlation with mammography is recommended. 2. 6.5 mm sclerotic focus within the posterior aspect of the T7 vertebral body. This may represent a bone island, however, correlation with follow-up abdomen  pelvis CT is recommended to exclude the presence of a metastatic lesion. 3. Small hiatal hernia. 4. 2.6 cm x 2.0 cm fat containing umbilical hernia. 5. Aortic atherosclerosis. Electronically Signed   By: Virgina Norfolk M.D.   On: 01/13/2020 18:20      Discharge Exam: Vitals:   01/16/20 0559 01/16/20 1233  BP: (!) 126/54 (!) 124/58  Pulse: 67 69  Resp: 16 16  Temp: 98.2 F (36.8 C) 98.9 F (37.2 C)  SpO2: 96% 95%   Vitals:   01/15/20 1822 01/16/20 0050 01/16/20 0559 01/16/20 1233  BP: 116/69 (!) 120/52 (!) 126/54 (!) 124/58  Pulse: 68 63 67 69  Resp: 16 16 16 16   Temp: 98.5 F  (36.9 C) 98.3 F (36.8 C) 98.2 F (36.8 C) 98.9 F (37.2 C)  TempSrc: Oral Oral Oral Oral  SpO2: 97% 98% 96% 95%  Weight:      Height:        General: Pt is alert, awake, not in acute distress Cardiovascular: RRR, S1/S2 +, no rubs, no gallops Respiratory: CTA bilaterally, no wheezing, no rhonchi Abdominal: Soft, NT, ND, bowel sounds + Extremities: no edema, no cyanosis    The results of significant diagnostics from this hospitalization (including imaging, microbiology, ancillary and laboratory) are listed below for reference.     Microbiology: Recent Results (from the past 240 hour(s))  SARS Coronavirus 2 by RT PCR (hospital order, performed in [facility] hospital lab) Nasopharyngeal Nasopharyngeal Swab     Status: None   Collection Time: 01/13/20  5:59 AM   Specimen: Nasopharyngeal Swab  Result Value Ref Range Status   SARS Coronavirus 2 NEGATIVE NEGATIVE Final    Comment: (NOTE) SARS-CoV-2 target nucleic acids are NOT DETECTED.  The SARS-CoV-2 RNA is generally detectable in upper and lower respiratory specimens during the acute phase of infection. The lowest concentration of SARS-CoV-2 viral copies this assay can detect is 250 copies / mL. A negative result does not preclude SARS-CoV-2 infection and should not be used as the sole basis for treatment or other patient management decisions.  A negative result may occur with improper specimen collection / handling, submission of specimen other than nasopharyngeal swab, presence of viral mutation(s) within the areas targeted by this assay, and inadequate number of viral copies (<250 copies / mL). A negative result must be combined with clinical observations, patient history, and epidemiological information.  Fact Sheet for Patients:   StrictlyIdeas.no  Fact Sheet for Healthcare Providers: BankingDealers.co.za  This test is not yet approved or  cleared by the [place]  FDA and has been authorized for detection and/or diagnosis of SARS-CoV-2 by FDA under an Emergency Use Authorization (EUA).  This EUA will remain in effect (meaning this test can be used) for the duration of the COVID-19 declaration under Section 564(b)(1) of the Act, 21 U.S.C. section 360bbb-3(b)(1), unless the authorization is terminated or revoked sooner.  Performed at [facility], [address] [place], [place] [address]      Labs: BNP (last 3 results) No results for input(s): BNP in the last 8760 hours. Basic Metabolic Panel: Recent Labs  Lab 01/11/20 1436 01/12/20 1608 01/14/20 0958  NA 141 140 142  K 4.1 4.0 4.1  CL 104 104 110  CO2 23 26 23   GLUCOSE 121* 138* 134*  BUN 35* 34* 17  CREATININE 0.81 0.86 0.75  CALCIUM 9.7 9.0 8.0*   Liver Function Tests: Recent Labs  Lab 01/11/20 1436 01/12/20 1608  AST  17 25  ALT 15 24  ALKPHOS  --  37*  BILITOT 0.4 0.7  PROT 6.7 6.5  ALBUMIN  --  3.8   No results for input(s): LIPASE, AMYLASE in the last 168 hours. No results for input(s): AMMONIA in the last 168 hours. CBC: Recent Labs  Lab 01/11/20 1436 01/11/20 1436 01/12/20 1608 01/12/20 1608 01/13/20 1220 01/13/20 1220 01/14/20 0958 01/14/20 1938 01/15/20 0811 01/15/20 2016 01/16/20 0115  WBC 15.4*   < > 13.4*  --  13.9*  --  10.6*  --  11.2*  --  10.3  NEUTROABS 11,073*  --   --   --  9.5*  --  6.4  --  7.3  --  5.8  HGB 9.2*   < > 7.9*   < > 7.9*   < > 7.4* 8.4* 9.1* 8.6* 8.4*  HCT 28.1*   < > 25.4*   < > 25.0*   < > 22.8* 26.0* 28.2* 26.5* 26.7*  MCV 98.6   < > 102.0*  --  97.7  --  98.7  --  97.6  --  97.4  PLT 258   < > 240  --  203  --  212  --  236  --  240   < > = values in this interval not displayed.   Cardiac Enzymes: No results for input(s): CKTOTAL, CKMB, CKMBINDEX, TROPONINI in the last 168 hours. BNP: Invalid input(s): POCBNP CBG: Recent Labs  Lab 01/15/20 1116 01/15/20 1603 01/15/20 2059 01/16/20 0601 01/16/20 1104   GLUCAP 126* 128* 153* 128* 146*   D-Dimer No results for input(s): DDIMER in the last 72 hours. Hgb A1c No results for input(s): HGBA1C in the last 72 hours. Lipid Profile No results for input(s): CHOL, HDL, LDLCALC, TRIG, CHOLHDL, LDLDIRECT in the last 72 hours. Thyroid function studies No results for input(s): TSH, T4TOTAL, T3FREE, THYROIDAB in the last 72 hours.  Invalid input(s): FREET3 Anemia work up No results for input(s): VITAMINB12, FOLATE, FERRITIN, TIBC, IRON, RETICCTPCT in the last 72 hours. Urinalysis    Component Value Date/Time   COLORURINE YELLOW 11/16/2019 1522   APPEARANCEUR Cloudy (A) 11/16/2019 1522   LABSPEC >=1.030 (A) 11/16/2019 1522   PHURINE 5.0 11/16/2019 1522   GLUCOSEU NEGATIVE 11/16/2019 1522   HGBUR NEGATIVE 11/16/2019 1522   BILIRUBINUR NEGATIVE 11/16/2019 1522   KETONESUR NEGATIVE 11/16/2019 1522   UROBILINOGEN 0.2 11/16/2019 1522   NITRITE NEGATIVE 11/16/2019 1522   LEUKOCYTESUR NEGATIVE 11/16/2019 1522   Sepsis Labs Invalid input(s): PROCALCITONIN,  WBC,  LACTICIDVEN Microbiology Recent Results (from the past 240 hour(s))  SARS Coronavirus 2 by RT PCR (hospital order, performed in [facility] hospital lab) Nasopharyngeal Nasopharyngeal Swab     Status: None   Collection Time: 01/13/20  5:59 AM   Specimen: Nasopharyngeal Swab  Result Value Ref Range Status   SARS Coronavirus 2 NEGATIVE NEGATIVE Final    Comment: (NOTE) SARS-CoV-2 target nucleic acids are NOT DETECTED.  The SARS-CoV-2 RNA is generally detectable in upper and lower respiratory specimens during the acute phase of infection. The lowest concentration of SARS-CoV-2 viral copies this assay can detect is 250 copies / mL. A negative result does not preclude SARS-CoV-2 infection and should not be used as the sole basis for treatment or other patient management decisions.  A negative result may occur with improper specimen collection / handling, submission of specimen other than  nasopharyngeal swab, presence of viral mutation(s) within the areas targeted by this assay, and  inadequate number of viral copies (<250 copies / mL). A negative result must be combined with clinical observations, patient history, and epidemiological information.  Fact Sheet for Patients:   StrictlyIdeas.no  Fact Sheet for Healthcare Providers: BankingDealers.co.za  This test is not yet approved or  cleared by the [place] FDA and has been authorized for detection and/or diagnosis of SARS-CoV-2 by FDA under an Emergency Use Authorization (EUA).  This EUA will remain in effect (meaning this test can be used) for the duration of the COVID-19 declaration under Section 564(b)(1) of the Act, 21 U.S.C. section 360bbb-3(b)(1), unless the authorization is terminated or revoked sooner.  Performed at [facility], [place] [address], [place], [place] [address]      Time coordinating discharge: Over 30 minutes  SIGNED:   Darliss Cheney, MD  [facility] 01/16/2020, 12:57 PM  If 7PM-7AM, please contact night-coverage [email or web address]

## 2020-01-16 NOTE — Progress Notes (Signed)
[facility] Progress Note    Since last GI note: Feeling well. Eating regular diet. Up and around the halls.  NO overt rebleeding.  Objective: Vital signs in last 24 hours: Temp:  [98.2 F (36.8 C)-98.8 F (37.1 C)] 98.2 F (36.8 C) (08/08 0559) Pulse Rate:  [63-68] 67 (08/08 0559) Resp:  [16] 16 (08/08 0559) BP: (116-126)/(51-69) 126/54 (08/08 0559) SpO2:  [96 %-98 %] 96 % (08/08 0559) Last BM Date: 01/15/20 General: alert and oriented times 3 Heart: regular rate and rythm Abdomen: soft, non-tender, non-distended, normal bowel sounds   Lab Results: Recent Labs    01/14/20 0958 01/14/20 1938 01/15/20 0811 01/15/20 2016 01/16/20 0115  WBC 10.6*  --  11.2*  --  10.3  HGB 7.4*   < > 9.1* 8.6* 8.4*  PLT 212  --  236  --  240  MCV 98.7  --  97.6  --  97.4   < > = values in this interval not displayed.   Recent Labs    01/14/20 0958  NA 142  K 4.1  CL 110  CO2 23  GLUCOSE 134*  BUN 17  CREATININE 0.75  CALCIUM 8.0*   Studies/Results: No results found.   Medications: Scheduled Meds: . diltiazem  240 mg Oral Daily  . insulin aspart  0-9 Units Subcutaneous TID AC & HS  . pantoprazole  40 mg Oral BID AC   Continuous Infusions: PRN Meds:.acetaminophen **OR** acetaminophen, ondansetron (ZOFRAN) IV    Assessment/Plan: 74 y.o. female friable gastric mass, abnormal left breast and abnormal T7  No signs of recurrent significant bleeding. Tolerated IV iron infusion yesterday.  I think she is safe to d/c home today. She needs to continue on PPI BID for now.   I will contact her with biopsies from her stomach mass are back.   She needs expedited outpatient visit in medical oncology and also with [facility].    Milus Banister, MD  01/16/2020, 11:44 AM [facility] Pager [phone]

## 2020-01-17 ENCOUNTER — Telehealth: Payer: Self-pay

## 2020-01-17 ENCOUNTER — Telehealth: Payer: Self-pay | Admitting: *Deleted

## 2020-01-17 DIAGNOSIS — C49A0 Gastrointestinal stromal tumor, unspecified site: Secondary | ICD-10-CM

## 2020-01-17 NOTE — Telephone Encounter (Signed)
Called pt concerning hosp f/u that's been made for 01/21/20. Pt is aware of appt inform had some additional questions concerning discharge. Completed TCM call below.Johny Chess  Transition Care Management Follow-up Telephone Call   Date discharged? 01/16/20   How have you been since you were released from the hospital? Pt states she is doing alright   Do you understand why you were in the hospital? YES   Do you understand the discharge instructions? YES   Where were you discharged to? Home   Items Reviewed:  Medications reviewed: YES, she states she is taking Omeprazole twice a day now  Allergies reviewed: YES, no changes  Dietary changes reviewed: YES, heart healthy. Pt states she is just eating soft food that she can tolerate  Referrals reviewed: YES, she states she is waiting to hear back from her Gastroenterologist to make appt   Functional Questionnaire:   Activities of Daily Living (ADLs):   She states she are independent in the following: ambulation, bathing and hygiene, feeding, continence, grooming and toileting States she doesn't require assistance    Any transportation issues/concerns?: NO   Any patient concerns? NO   Confirmed importance and date/time of follow-up visits scheduled YES, appt 01/21/20  Provider Appointment booked with Dr. Jenny Reichmann  Confirmed with patient if condition begins to worsen call PCP or go to the ER.  Patient was given the office number and encouraged to call back with question or concerns.  : YES

## 2020-01-17 NOTE — Telephone Encounter (Signed)
-----   Message from Milus Banister, MD sent at 01/16/2020 10:28 PM EDT ----- Marykay Lex, Can you keep on the lookout for her gastric mass biopsies?  Let me know if you see them before me, ok to text or call.  Probably a cancer and she is going to need expedited medical onc and [facility] OP visits.  Both teams have heard about her already.  She went home today.  thanks

## 2020-01-17 NOTE — Telephone Encounter (Signed)
8/9 11:22 am no path available at this time.  Will continue to look for result

## 2020-01-18 ENCOUNTER — Telehealth: Payer: Self-pay | Admitting: Hematology

## 2020-01-18 LAB — SURGICAL PATHOLOGY

## 2020-01-18 NOTE — Telephone Encounter (Signed)
8/10 11:44 am no path resulted as of this time

## 2020-01-18 NOTE — Telephone Encounter (Signed)
Pathology has returned.  I sent Dr Ardis Hughs a copy of the path and he gave a verbal order to make urgent referral to med oncology.  Referral has been made.  Dr Ardis Hughs will call the pt.

## 2020-01-18 NOTE — Telephone Encounter (Signed)
A new patient appt has been scheduled for Bridget Grant to see Dr. Burr Medico on 8/16 at 3pm for gastric mass. I cld to offer her an appt for tomorrow morning, but pt declined because she has to make arrangements for someone to watch her husband. Pt has been made aware to arrive 15 minutes early.

## 2020-01-21 ENCOUNTER — Encounter: Payer: Self-pay | Admitting: Internal Medicine

## 2020-01-21 ENCOUNTER — Ambulatory Visit (INDEPENDENT_AMBULATORY_CARE_PROVIDER_SITE_OTHER): Payer: Medicare Other | Admitting: Internal Medicine

## 2020-01-21 ENCOUNTER — Other Ambulatory Visit: Payer: Self-pay

## 2020-01-21 DIAGNOSIS — I10 Essential (primary) hypertension: Secondary | ICD-10-CM | POA: Diagnosis not present

## 2020-01-21 DIAGNOSIS — E119 Type 2 diabetes mellitus without complications: Secondary | ICD-10-CM

## 2020-01-21 DIAGNOSIS — K3189 Other diseases of stomach and duodenum: Secondary | ICD-10-CM

## 2020-01-21 DIAGNOSIS — K922 Gastrointestinal hemorrhage, unspecified: Secondary | ICD-10-CM

## 2020-01-21 NOTE — Progress Notes (Signed)
Subjective:    Patient ID: Bridget Grant, female    DOB: 1945/09/01, 74 y.o.   MRN: [id number]  HPI  Here to f/u recent hospn aug 5 - aug 8 who presentedwith complaints of hematemesis and melena. She was sent to the ED by her PCP as lab work revealed hemoglobin of 9.2, down from 14.2 amonth ago.Patient states 3 daysprior to presentation,she had pizza and soon after vomited blood.  She then had another episode of vomiting blood at the same day. Since then the vomiting has resolved but she has had dark tarry loose stoolsassociated with generalized weakness. Denied abdominal pain. Denied prior history of GI bleed. She takes a baby aspirin daily but is not on any other blood thinners (aspirin is not one of her home medications.  She is advised not to take it until she has more work-up for possible cancer as her tumor was apparently very friable per GI EGD report). Deniedrecent NSAIDor alcoholuse Her hemoglobin was 7.9. COVID-19 negative. Patient was admitted under hospitalist service. Patient received 1 unit of PRBC transfusion in the ED, and 1 unit further later as well. GI consulted. Patient underwent EGD and found to have large friable gastric tumor which was biopsied. Pathology results are still pending even today.  Pt was hemodynamically stable and Hgb stable post EGD, and felt stable for outpt f/u with oncology, for which pt has appt soon.  Pt was d/c on omeprazole 40 bid, and to f/u with GI as well as general surgury  Today, Pt denies chest pain, increased sob or doe, wheezing, orthopnea, PND, increased LE swelling, palpitations, dizziness or syncope.  Denies worsening reflux, abd pain, dysphagia, n/v, bowel change or blood.  Pt was recommended for f/u lab today, as well f/u pending lab cbc from aug 5 - done.  Transitional Care Management elements noted today: 1)  Date of D/C: as above 2)  Medication reconciliation:  done today at end visit 3)  Review of D/C summary or other  information:  done today 4)  Review of need for f/u on pending diagnostic tests and treatments:  done today 5)  Review of need for Interaction with other providers who will assume or resume care of pt specific problems: done today 6)  Education of patient/family/guardian or caregiver: done today 7)  Establishment or Re-establishment of referrals and arranging for needed community resources:  done today 8)  Assistance in scheduling any required follow up with community providers and services:  done today Past Medical History:  Diagnosis Date  . Anxiety   . Depression   . Hyperlipidemia   . Hypertension   . Migraine   . Osteopenia   . Type II or unspecified type diabetes mellitus without mention of complication, uncontrolled 04/09/2013  . Upper GI bleed 01/13/2020   Past Surgical History:  Procedure Laterality Date  . ABDOMINAL HYSTERECTOMY    . BIOPSY  01/13/2020   Procedure: BIOPSY;  Surgeon: Milus Banister, MD;  Location: [facility] ENDOSCOPY;  Service: Endoscopy;;  . CHOLECYSTECTOMY    . ESOPHAGOGASTRODUODENOSCOPY (EGD) WITH PROPOFOL N/A 01/13/2020   Procedure: ESOPHAGOGASTRODUODENOSCOPY (EGD) WITH PROPOFOL;  Surgeon: Milus Banister, MD;  Location: [facility] ENDOSCOPY;  Service: Endoscopy;  Laterality: N/A;    reports that she has never smoked. She has never used smokeless tobacco. She reports that she does not drink alcohol and does not use drugs. family history includes Cancer in her mother; Hypertension in her father. No Known Allergies Current Outpatient Medications on File Prior  to Visit  Medication Sig Dispense Refill  . atorvastatin (LIPITOR) 20 MG tablet TAKE 1 TABLET BY MOUTH EVERY DAY (Patient taking differently: Take 20 mg by mouth daily. ) 90 tablet 3  . diltiazem (CARDIZEM CD) 240 MG 24 hr capsule TAKE 1 CAPSULE(240 MG) BY MOUTH DAILY (Patient taking differently: Take 240 mg by mouth daily. ) 90 capsule 3  . glipiZIDE (GLUCOTROL XL) 10 MG 24 hr tablet TAKE 1 TABLET BY MOUTH DAILY  WITH BREAKFAST (Patient taking differently: Take 10 mg by mouth daily with breakfast. ) 90 tablet 0  . hydrochlorothiazide (MICROZIDE) 12.5 MG capsule Take 1 capsule (12.5 mg total) by mouth daily. 90 capsule 3  . lisinopril (ZESTRIL) 20 MG tablet TAKE 1 TABLET BY MOUTH EVERY DAY (Patient taking differently: Take 20 mg by mouth at bedtime. ) 90 tablet 3  . metFORMIN (GLUCOPHAGE-XR) 500 MG 24 hr tablet TAKE 4 TABLETS(2000 MG) BY MOUTH DAILY WITH BREAKFAST (Patient taking differently: Take 1,500 mg by mouth daily with breakfast. ) 360 tablet 1  . pantoprazole (PROTONIX) 40 MG tablet Take 1 tablet (40 mg total) by mouth 2 (two) times daily before a meal. 60 tablet 0  . pioglitazone (ACTOS) 15 MG tablet Take 1 tablet (15 mg total) by mouth daily. 90 tablet 3  . vitamin B-12 (CYANOCOBALAMIN) 1000 MCG tablet Take 1 tablet (1,000 mcg total) by mouth daily. 90 tablet 3  . Vitamin D, Ergocalciferol, (DRISDOL) 1.25 MG (50000 UNIT) CAPS capsule Take 1 capsule (50,000 Units total) by mouth every 7 (seven) days. (Patient taking differently: Take 50,000 Units by mouth every Sunday. ) 12 capsule 0  . [DISCONTINUED] diltiazem (TIAZAC) 240 MG 24 hr capsule Take 1 capsule (240 mg total) by mouth daily. 90 capsule 3   No current facility-administered medications on file prior to visit.   Review of Systems All otherwise neg per pt    Objective:   Physical Exam BP (!) 160/80 (BP Location: Left Arm, Patient Position: Sitting, Cuff Size: Large)   Pulse 71   Temp 98.3 F (36.8 C) (Oral)   Ht 5\' 2"  (1.575 m)   Wt 202 lb (91.6 kg)   SpO2 96%   BMI 36.95 kg/m  VS noted,  Constitutional: Pt appears in NAD HENT: Head: NCAT.  Right Ear: External ear normal.  Left Ear: External ear normal.  Eyes: . Pupils are equal, round, and reactive to light. Conjunctivae and EOM are normal Nose: without d/c or deformity Neck: Neck supple. Gross normal ROM Cardiovascular: Normal rate and regular rhythm.   Pulmonary/Chest:  Effort normal and breath sounds without rales or wheezing.  Abd:  Soft, NT, ND, + BS, no organomegaly Neurological: Pt is alert. At baseline orientation, motor grossly intact Skin: Skin is warm. No rashes, other new lesions, no LE edema Psychiatric: Pt behavior is normal without agitation  All otherwise neg per pt Lab Results  Component Value Date   WBC 10.3 01/16/2020   HGB 8.4 (L) 01/16/2020   HCT 26.7 (L) 01/16/2020   PLT 240 01/16/2020   GLUCOSE 134 (H) 01/14/2020   CHOL 142 11/16/2019   TRIG 192.0 (H) 11/16/2019   HDL 41.90 11/16/2019   LDLDIRECT 85.0 10/16/2016   LDLCALC 62 11/16/2019   ALT 24 01/12/2020   AST 25 01/12/2020   NA 142 01/14/2020   K 4.1 01/14/2020   CL 110 01/14/2020   CREATININE 0.75 01/14/2020   BUN 17 01/14/2020   CO2 23 01/14/2020   TSH 2.21 11/16/2019  INR 0.9 01/11/2020   HGBA1C 8.6 (H) 11/16/2019   MICROALBUR 1.1 11/16/2019          Assessment & Plan:

## 2020-01-21 NOTE — Progress Notes (Signed)
[facility]   [phone]) [phone] Fax:([phone]) [place] Note   Patient Care Team: [name], MD as PCP - Charissa Bash, MD as Consulting Physician (Oncology) Jonnie Finner, RN as Oncology Nurse Navigator  Date of Service:  01/24/2020   CHIEF COMPLAINTS/PURPOSE OF CONSULTATION:  Newly diagnosed gastric GIST  REFERRING PHYSICIAN:  Dr Ardis Hughs   Oncology History Overview Note  Cancer Staging No matching staging information was found for the patient.    Malignant gastrointestinal stromal tumor (GIST) of stomach ([place])  01/13/2020 Procedure   EGD/Upper Endoscopy by Dr Ardis Hughs 01/13/20  IMPRESSION - There was an ulcerated mass along the greater curvature of the stomach, proximal edge was about 5-6cm from the GE junction and the mass was 7-8cm long, 2-3cm wide. It was not circumferental. The mass was friable with spontaneous oozing, contact oozing, ulcerated and heaped up. Some parts of the mass were quite soft whereas other areas were more firm. This was very suspicious for a malignancy and it was biopsied extensively. There was mild distal gastritis, biopsied to check for H. pylori. - The examination was otherwise normal.   01/13/2020 Initial Biopsy   FINAL MICROSCOPIC DIAGNOSIS:  A. STOMACH, BIOPSY:  - High grade gastrointestinal stromal tumor, see comment.   B. STOMACH, DISTAL, BIOPSY:  - Mild reactive gastropathy with erosions.  - Warthin-Starry is negative for Helicobacter pylori.  - No intestinal metaplasia, dysplasia, or malignancy.  COMMENT:  A. The tumor has spindle and epithelioid components and appears high  grade (7 mitosis/20 hpf). Immunohistochemistry is positive for CD117 and  CD34. The cells are negative for cytokeratin 20, cytokeratin 7,  cytokeratin 5/6, CDX-2, SMA, and desmin. The findings are consistent  with a gastrointestinal stromal tumor. Dr. Vic Ripper has reviewed the  case. Dr. Ardis Hughs' nurse was notified on  01/18/2020.     01/13/2020 Imaging   CT CAP W contrast 01/13/20  IMPRESSION: 1. 2.1 cm x 1.1 cm well-defined area of low attenuation within the soft tissues of the left breast. This may represent a lymph node, however, correlation with mammography is recommended. 2. 6.5 mm sclerotic focus within the posterior aspect of the T7 vertebral body. This may represent a bone island, however, correlation with follow-up abdomen pelvis CT is recommended to exclude the presence of a metastatic lesion. 3. Small hiatal hernia. 4. 2.6 cm x 2.0 cm fat containing umbilical hernia. 5. Aortic atherosclerosis.    01/23/2020 Initial Diagnosis   Malignant gastrointestinal stromal tumor (GIST) of stomach ([facility])      HISTORY OF PRESENTING ILLNESS:  Bridget Grant 74 y.o. female is a here because of newly diagnosed GIST. The patient was referred by Dr Barry Dienes. The patient presents to the clinic today accompanied by her sister-in-law.  She was hospitalized on 01/12/20 with severe anemia after episodes of hematemesis and 1 episode of hematochezia since 01/09/20. She went to PCP office who did labs and told her to go to ED. She was treated in hospital with blood transfusion and worked up with endoscopy and biopsy. Her path showed GIST in stomach. She denies any prior symptoms before that. She denies nausea or stomach pain at the times. After first episode of vomiting she had diarrhea with black stool once. She has been trying to lose weight but no significant weight loss. Before actively trying to lose weight she was 220 range. She notes since her endoscopy she has no more blood in stool and energy feels better.  Socially she is married with no children. She rarely drinks, she is a non smoker and does not use recreational drugs. She is retired from working in [employer] and other jobs. Her husband is paralyzed from tumor on spine. She is his caregiver and he is able to get around by powered chair.   She has a PMHx of DM,  diagnosed 2-3 years ago. Her weight is stable around 205 pounds.  I reviewed her medication list with her.  Her mother had breast cancer at age 59 and died from ovarian cancer. Her MGM had unknown cancer.    REVIEW OF SYSTEMS:    Constitutional: Denies fevers, chills or abnormal night sweats Eyes: Denies blurriness of vision, double vision or watery eyes Ears, nose, mouth, throat, and face: Denies mucositis or sore throat Respiratory: Denies cough, dyspnea or wheezes Cardiovascular: Denies palpitation, chest discomfort or lower extremity swelling Gastrointestinal:  Denies nausea, heartburn or change in bowel habits Skin: Denies abnormal skin rashes Lymphatics: Denies new lymphadenopathy or easy bruising Neurological:Denies numbness, tingling or new weaknesses Behavioral/Psych: Mood is stable, no new changes  All other systems were reviewed with the patient and are negative.   MEDICAL HISTORY:  Past Medical History:  Diagnosis Date  . Anxiety   . Depression   . Hyperlipidemia   . Hypertension   . Migraine   . Osteopenia   . Type II or unspecified type diabetes mellitus without mention of complication, uncontrolled 04/09/2013  . Upper GI bleed 01/13/2020    SURGICAL HISTORY: Past Surgical History:  Procedure Laterality Date  . ABDOMINAL HYSTERECTOMY    . BIOPSY  01/13/2020   Procedure: BIOPSY;  Surgeon: Milus Banister, MD;  Location: [facility] ENDOSCOPY;  Service: Endoscopy;;  . CHOLECYSTECTOMY    . ESOPHAGOGASTRODUODENOSCOPY (EGD) WITH PROPOFOL N/A 01/13/2020   Procedure: ESOPHAGOGASTRODUODENOSCOPY (EGD) WITH PROPOFOL;  Surgeon: Milus Banister, MD;  Location: [facility] ENDOSCOPY;  Service: Endoscopy;  Laterality: N/A;    SOCIAL HISTORY: Social History   Socioeconomic History  . Marital status: Married    Spouse name: Not on file  . Number of children: 0  . Years of education: Not on file  . Highest education level: Not on file  Occupational History  . Occupation: retired     Tobacco Use  . Smoking status: Never Smoker  . Smokeless tobacco: Never Used  Vaping Use  . Vaping Use: Never used  Substance and Sexual Activity  . Alcohol use: No  . Drug use: No  . Sexual activity: Not on file  Other Topics Concern  . Not on file  Social History Narrative  . Not on file   Social Determinants of Health   Financial Resource Strain:   . Difficulty of Paying Living Expenses:   Food Insecurity:   . Worried About Charity fundraiser in the Last Year:   . Arboriculturist in the Last Year:   Transportation Needs:   . Film/video editor (Medical):   Marland Kitchen Lack of Transportation (Non-Medical):   Physical Activity:   . Days of Exercise per Week:   . Minutes of Exercise per Session:   Stress:   . Feeling of Stress :   Social Connections:   . Frequency of Communication with Friends and Family:   . Frequency of Social Gatherings with Friends and Family:   . Attends Religious Services:   . Active Member of Clubs or Organizations:   . Attends Archivist Meetings:   Marland Kitchen Marital Status:  Intimate Partner Violence:   . Fear of Current or Ex-Partner:   . Emotionally Abused:   Marland Kitchen Physically Abused:   . Sexually Abused:     FAMILY HISTORY: Family History  Problem Relation Age of Onset  . Cancer Mother 51       breast cancer and ovarian cancer   . Hypertension Father   . Cancer Maternal Grandmother        unknown type of cancer     ALLERGIES:  has No Known Allergies.  MEDICATIONS:  Current Outpatient Medications  Medication Sig Dispense Refill  . atorvastatin (LIPITOR) 20 MG tablet TAKE 1 TABLET BY MOUTH EVERY DAY (Patient taking differently: Take 20 mg by mouth daily. ) 90 tablet 3  . diltiazem (CARDIZEM CD) 240 MG 24 hr capsule TAKE 1 CAPSULE(240 MG) BY MOUTH DAILY (Patient taking differently: Take 240 mg by mouth daily. ) 90 capsule 3  . glipiZIDE (GLUCOTROL XL) 10 MG 24 hr tablet TAKE 1 TABLET BY MOUTH DAILY WITH BREAKFAST (Patient taking  differently: Take 10 mg by mouth daily with breakfast. ) 90 tablet 0  . hydrochlorothiazide (MICROZIDE) 12.5 MG capsule Take 1 capsule (12.5 mg total) by mouth daily. 90 capsule 3  . lisinopril (ZESTRIL) 20 MG tablet TAKE 1 TABLET BY MOUTH EVERY DAY (Patient taking differently: Take 20 mg by mouth at bedtime. ) 90 tablet 3  . metFORMIN (GLUCOPHAGE-XR) 500 MG 24 hr tablet TAKE 4 TABLETS(2000 MG) BY MOUTH DAILY WITH BREAKFAST (Patient taking differently: Take 1,500 mg by mouth daily with breakfast. ) 360 tablet 1  . pantoprazole (PROTONIX) 40 MG tablet Take 1 tablet (40 mg total) by mouth 2 (two) times daily before a meal. 60 tablet 0  . pioglitazone (ACTOS) 15 MG tablet Take 1 tablet (15 mg total) by mouth daily. 90 tablet 3  . vitamin B-12 (CYANOCOBALAMIN) 1000 MCG tablet Take 1 tablet (1,000 mcg total) by mouth daily. 90 tablet 3  . Vitamin D, Ergocalciferol, (DRISDOL) 1.25 MG (50000 UNIT) CAPS capsule Take 1 capsule (50,000 Units total) by mouth every 7 (seven) days. (Patient taking differently: Take 50,000 Units by mouth every Sunday. ) 12 capsule 0   No current facility-administered medications for this visit.    PHYSICAL EXAMINATION: ECOG PERFORMANCE STATUS: 0 - Asymptomatic  Vitals:   01/24/20 1443 01/24/20 1444  BP: (!) 154/71 (!) 149/55  Pulse: 82   Resp: 18   Temp: 97.8 F (36.6 C)   SpO2: 97%    Filed Weights   01/24/20 1443  Weight: 203 lb 11.2 oz (92.4 kg)    GENERAL:alert, no distress and comfortable SKIN: skin color, texture, turgor are normal, no rashes or significant lesions EYES: normal, Conjunctiva are pink and non-injected, sclera clear  NECK: supple, thyroid normal size, non-tender, without nodularity LYMPH:  no palpable lymphadenopathy in the cervical, axillary  LUNGS: clear to auscultation and percussion with normal breathing effort HEART: regular rate & rhythm and no murmurs (+) lower extremity edema ABDOMEN:abdomen soft, non-tender and normal bowel  sounds Musculoskeletal:no cyanosis of digits and no clubbing  NEURO: alert & oriented x 3 with fluent speech, no focal motor/sensory deficits BREAST: No palpable mass, nodules or adenopathy bilaterally. Breast exam benign.   LABORATORY DATA:  I have reviewed the data as listed CBC Latest Ref Rng & Units 01/24/2020 01/16/2020 01/15/2020  WBC 4.0 - 10.5 K/uL 9.2 10.3 -  Hemoglobin 12.0 - 15.0 g/dL 10.1(L) 8.4(L) 8.6(L)  Hematocrit 36 - 46 % 31.6(L) 26.7(L) 26.5(L)  Platelets 150 - 400 K/uL 322 240 -    CMP Latest Ref Rng & Units 01/24/2020 01/14/2020 01/12/2020  Glucose 70 - 99 mg/dL 94 134(H) 138(H)  BUN 8 - 23 mg/dL 10 17 34(H)  Creatinine 0.44 - 1.00 mg/dL 1.07(H) 0.75 0.86  Sodium 135 - 145 mmol/L 142 142 140  Potassium 3.5 - 5.1 mmol/L 4.1 4.1 4.0  Chloride 98 - 111 mmol/L 106 110 104  CO2 22 - 32 mmol/L _0 Calcium 8.9 - 10.3 mg/dL 9.7 8.0(L) 9.0  Total Protein 6.5 - 8.1 g/dL 6.9 - 6.5  Total Bilirubin 0.3 - 1.2 mg/dL 0.3 - 0.7  Alkaline Phos 38 - 126 U/L 48 - 37(L)  AST 15 - 41 U/L 15 - 25  ALT 0 - 44 U/L 18 - 24     RADIOGRAPHIC STUDIES: I have personally reviewed the radiological images as listed and agreed with the findings in the report. CT CHEST W CONTRAST  Result Date: 01/13/2020 CLINICAL DATA:  Staging of gastrointestinal cancer. EXAM: CT CHEST, ABDOMEN, AND PELVIS WITH CONTRAST TECHNIQUE: Multidetector CT imaging of the chest, abdomen and pelvis was performed following the standard protocol during bolus administration of intravenous contrast. CONTRAST:  175m OMNIPAQUE IOHEXOL 350 MG/ML SOLN COMPARISON:  None. FINDINGS: CT CHEST FINDINGS Cardiovascular: There is moderate severity calcification of the aortic arch. Normal heart size. No pericardial effusion. Mediastinum/Nodes: No enlarged mediastinal, hilar, or axillary lymph nodes. The thyroid gland and trachea demonstrate no significant findings. A small hiatal hernia is seen. Lungs/Pleura: Lungs are clear. No pleural  effusion or pneumothorax. Musculoskeletal: A 2.1 cm x 1.1 cm well-defined area of low attenuation is seen within the soft tissues of the left breast (axial CT image 28, CT series number 3). A 6.5 mm sclerotic focus is seen within the posterior aspect of the T7 vertebral body. CT ABDOMEN PELVIS FINDINGS Hepatobiliary: No focal liver abnormality is seen. Status post cholecystectomy. No biliary dilatation. Pancreas: Unremarkable. No pancreatic ductal dilatation or surrounding inflammatory changes. Spleen: Normal in size without focal abnormality. Adrenals/Urinary Tract: Adrenal glands are unremarkable. Kidneys are normal in size, without renal calculi or hydronephrosis. A 1.1 cm cyst is seen along the anteromedial aspect of the lower pole of the right kidney. Bladder is unremarkable. Stomach/Bowel: There is a small hiatal hernia. The appendix is not identified. No evidence of bowel wall thickening, distention, or inflammatory changes. Vascular/Lymphatic: There is mild calcification of the abdominal aorta and bilateral common iliac arteries, without evidence of aneurysmal dilatation or dissection. No enlarged abdominal or pelvic lymph nodes. Reproductive: Status post hysterectomy. No adnexal masses. Other: There is a 2.6 cm x 2.0 cm fat containing umbilical hernia. No abdominopelvic ascites. Musculoskeletal: No acute or significant osseous findings. IMPRESSION: 1. 2.1 cm x 1.1 cm well-defined area of low attenuation within the soft tissues of the left breast. This may represent a lymph node, however, correlation with mammography is recommended. 2. 6.5 mm sclerotic focus within the posterior aspect of the T7 vertebral body. This may represent a bone island, however, correlation with follow-up abdomen pelvis CT is recommended to exclude the presence of a metastatic lesion. 3. Small hiatal hernia. 4. 2.6 cm x 2.0 cm fat containing umbilical hernia. 5. Aortic atherosclerosis. Aortic Atherosclerosis (ICD10-I70.0).  Electronically Signed   By: TVirgina NorfolkM.D.   On: 01/13/2020 18:18   CT ABDOMEN PELVIS W CONTRAST  Result Date: 01/13/2020 CLINICAL DATA:  Gastrointestinal cancer staging. EXAM: CT CHEST, ABDOMEN, AND PELVIS WITH CONTRAST TECHNIQUE:  Multidetector CT imaging of the chest, abdomen and pelvis was performed following the standard protocol during bolus administration of intravenous contrast. CONTRAST:  171m OMNIPAQUE IOHEXOL 350 MG/ML SOLN COMPARISON:  None. FINDINGS: CT CHEST FINDINGS Cardiovascular: There is moderate severity calcification of the aortic arch. Normal heart size. No pericardial effusion. Mediastinum/Nodes: No enlarged mediastinal, hilar, or axillary lymph nodes. The thyroid gland and trachea demonstrate no significant findings. A small hiatal hernia is seen. Lungs/Pleura: Lungs are clear. No pleural effusion or pneumothorax. Musculoskeletal: A 2.1 cm x 1.1 cm well-defined area of low attenuation is seen within the soft tissues of the left breast (axial CT image 28, CT series number 3). A 6.5 mm sclerotic focus is seen within the posterior aspect of the T7 vertebral body. CT ABDOMEN PELVIS FINDINGS Hepatobiliary: No focal liver abnormality is seen. Status post cholecystectomy. No biliary dilatation. Pancreas: Unremarkable. No pancreatic ductal dilatation or surrounding inflammatory changes. Spleen: Normal in size without focal abnormality. Adrenals/Urinary Tract: Adrenal glands are unremarkable. Kidneys are normal in size, without renal calculi or hydronephrosis. A 1.1 cm cyst is seen along the anteromedial aspect of the lower pole of the right kidney. Bladder is unremarkable. Stomach/Bowel: There is a small hiatal hernia. The appendix is not identified. No evidence of bowel wall thickening, distention, or inflammatory changes. Vascular/Lymphatic: There is mild calcification of the abdominal aorta and bilateral common iliac arteries, without evidence of aneurysmal dilatation or dissection. No  enlarged abdominal or pelvic lymph nodes. Reproductive: Status post hysterectomy. No adnexal masses. Other: There is a 2.6 cm x 2.0 cm fat containing umbilical hernia. No abdominopelvic ascites. Musculoskeletal: No acute or significant osseous findings. IMPRESSION: 1. 2.1 cm x 1.1 cm well-defined area of low attenuation within the soft tissues of the left breast. This may represent a lymph node, however, correlation with mammography is recommended. 2. 6.5 mm sclerotic focus within the posterior aspect of the T7 vertebral body. This may represent a bone island, however, correlation with follow-up abdomen pelvis CT is recommended to exclude the presence of a metastatic lesion. 3. Small hiatal hernia. 4. 2.6 cm x 2.0 cm fat containing umbilical hernia. 5. Aortic atherosclerosis. Electronically Signed   By: TVirgina NorfolkM.D.   On: 01/13/2020 18:20    ASSESSMENT & PLAN:  Bridget FRAGOSOis a 74y.o. Caucasian female with a history of Anxiety/Depression, HLD, HTN, Osteopenia, DM   1. Gastric GIST, [id number],  High grade  -I discussed her image findings and pathology report in great detail with patient and her family. After presenting with severe upper GI bleeding her work up with Endoscopy showed ulcerated mass along the greater curvature of the stomach, proximal edge was about 5-6cm from the GE junction and 7-8cm long and 2-3cm wide. The path showed High grade gastrointestinal stromal tumor.  -I personally reviewed her CT CAP from 01/13/20 which did not show gastric mass (I think the greater curvature of gastric wall is thickened)  but did show incidental findings of left breast mass and 6.562msclerotic vertebral body lesion. These are so far assumed to be unrelated to her GIST and will work up further. Dr. ByBarry Dienesas ordered mammogram and left breast [place] I will get a thoracic spine MRI w wo contrast  -I discussed the nature course of non-metastatic GIST and its standard treatment of surgery which is  curative. She has seen Dr, ByBarry Dienesnd surgery is offered and being scheduled -Although she would likely be cured by surgery alone she has moderate risk  of recurrence given the size and her high grade disease. I recommend adjuvant treatment with biological agent Imatinib for 3 years to reduce her risk of recurrence. I reviewed benefit and side effects with her in great detail, especially fatigue, gastric discomfort, diarrhea, low blood counts, it etc.  She agrees to proceed. -Prior to starting [place] I will test her tumor to see if it contains KIT mutation which is targetable by Gleevec. She is agreeable.  -Given her bleeding tumor, large size and other work up, I recommend she start [place] before surgery while she waits. This can start to shrink her tumor and improve her surgical outcome, She is agreeable.  -I discussed if her work up for her bone lesion is found to be related to her GIST, her GIST would be metastatic and no longer eligible for surgery. I would recommend continuing [place] to control her disease.  -If her bleeding becomes significant, she would proceed with surgery sooner. I encouraged her to contact clinic if she has any concerning or significant symptoms or side effects.  -Physical exam today unremarkable.  -F/u in 2-3 weeks.    2. Anemia secondary to GI bleeding from #1, B12 deficiency  -She was hospitalized on 01/12/20 due to severe anemia with Hg 7.9.  -She was treated with blood transfusion on 01/17/20 and IV Feraheme on 01/15/20.  -She has been on Monthly B12 injections since 05/2019 due to her B12 deficiency (B12 was 163).  -Since her endoscopy she no longer has Hematemesis or hematochezia, but likely still has bleeding as long as her tumor is there.  -Labs reviewed today,  (01/24/20)   3. Left Breast Mass, Genetic Testing  -Her 01/13/20 CT scan showed 2.1cm left breast mass. She has not had mammogram in several years.  -She will proceed with Mammogram and [place] this month and will  likely have biopsy.  -She has no palpable mass on today initial exam (01/24/20) -Her mother was diagnosed with breast cancer at age 36 and died from ovarian cancer which is suspicious for heritable mutations, such as BRCA 1 or 2. I discussed proceeding with Genetic testing.  -She does not have children herself but does have a brother. She is s/p total hysterectomy and BSO. She will think about genetic testing.    4. T7 Spinal Lesion  -Her 01/13/20 CT scan also shows a 6.5 mm sclerotic focus within the posterior aspect of the T7 vertebral body.  -If she is found to have breast cancer, I would biopsy her bone lesion to evaluate for bone metastasis.  -I also recommend MRI spine to further evaluate prior to biopsy. She is agreeable.   5. Social Support -She lives with her husband and has no children.  -Her husband is paralyzed from tumor on spine. She is his caregiver and he is able to get around by powered chair.  -She has support from her sister-in-law and brother.  -If high co-pay for Gleevec I reviewed [facility] resources that can help her gain financial assistance.    PLAN:  -Lab today  -Mammogram/US and biopsy in 1-2 weeks  -MRI Thoracic Spine w wo contrast in 1-2 week  -I prescribed Gleevec, she will start as soon as she receives it   -Lab and F/u in 2-3 weeks  -will give iv feraheme if ferritin<100 or low iron saturation with anemia    Orders Placed This Encounter  Procedures  . MR THORACIC SPINE W WO CONTRAST    Standing Status:   Future  Standing Expiration Date:   01/23/2021    Order Specific Question:   GRA to provide read?    Answer:   Yes    Order Specific Question:   If indicated for the ordered procedure, I authorize the administration of contrast media per Radiology protocol    Answer:   Yes    Order Specific Question:   What is the patient's sedation requirement?    Answer:   No Sedation    Order Specific Question:   Use SRS Protocol?    Answer:   No    Order Specific  Question:   Does the patient have a pacemaker or implanted devices?    Answer:   No    Order Specific Question:   Preferred imaging location?    Answer:   [facility] (table limit - 550 lbs)    Order Specific Question:   Radiology Contrast Protocol - do NOT remove file path    Answer:   [email or web address]    All questions were answered. The patient knows to call the clinic with any problems, questions or concerns. The total time spent in the appointment was 60 minutes.     Truitt Merle, MD 01/24/2020 11:25 PM  I, Joslyn Devon, am acting as scribe for Truitt Merle, MD.   I have reviewed the above documentation for accuracy and completeness, and I agree with the above.

## 2020-01-23 ENCOUNTER — Other Ambulatory Visit: Payer: Self-pay | Admitting: Hematology

## 2020-01-23 ENCOUNTER — Encounter: Payer: Self-pay | Admitting: Internal Medicine

## 2020-01-23 DIAGNOSIS — D500 Iron deficiency anemia secondary to blood loss (chronic): Secondary | ICD-10-CM

## 2020-01-23 DIAGNOSIS — D62 Acute posthemorrhagic anemia: Secondary | ICD-10-CM

## 2020-01-23 DIAGNOSIS — C49A2 Gastrointestinal stromal tumor of stomach: Secondary | ICD-10-CM

## 2020-01-23 NOTE — Assessment & Plan Note (Signed)
Pathology pending, for f/u with general surgury and oncology as planned - has appts

## 2020-01-23 NOTE — Assessment & Plan Note (Addendum)
Overall stable, cont PPI, f/u GI as planned, pt declines labs today since appt is very soon

## 2020-01-23 NOTE — Assessment & Plan Note (Signed)
stable overall by history and exam, recent data reviewed with pt, and pt to continue medical treatment as before,  to f/u any worsening symptoms or concerns  

## 2020-01-23 NOTE — Patient Instructions (Signed)
Please continue all other medications as before, and refills have been done if requested.  Please have the pharmacy call with any other refills you may need.  Please continue your efforts at being more active, low cholesterol diet, and weight control.  Please keep your appointments with your specialists as you may have planned     

## 2020-01-24 ENCOUNTER — Other Ambulatory Visit: Payer: Self-pay

## 2020-01-24 ENCOUNTER — Encounter: Payer: Self-pay | Admitting: Hematology

## 2020-01-24 ENCOUNTER — Inpatient Hospital Stay: Payer: Medicare Other

## 2020-01-24 ENCOUNTER — Inpatient Hospital Stay: Payer: Medicare Other | Attending: Hematology | Admitting: Hematology

## 2020-01-24 VITALS — BP 149/55 | HR 82 | Temp 97.8°F | Resp 18 | Ht 62.0 in | Wt 203.7 lb

## 2020-01-24 DIAGNOSIS — D62 Acute posthemorrhagic anemia: Secondary | ICD-10-CM

## 2020-01-24 DIAGNOSIS — Z8041 Family history of malignant neoplasm of ovary: Secondary | ICD-10-CM | POA: Insufficient documentation

## 2020-01-24 DIAGNOSIS — Z638 Other specified problems related to primary support group: Secondary | ICD-10-CM | POA: Diagnosis not present

## 2020-01-24 DIAGNOSIS — E119 Type 2 diabetes mellitus without complications: Secondary | ICD-10-CM | POA: Diagnosis not present

## 2020-01-24 DIAGNOSIS — Z809 Family history of malignant neoplasm, unspecified: Secondary | ICD-10-CM | POA: Diagnosis not present

## 2020-01-24 DIAGNOSIS — Z90710 Acquired absence of both cervix and uterus: Secondary | ICD-10-CM | POA: Insufficient documentation

## 2020-01-24 DIAGNOSIS — E538 Deficiency of other specified B group vitamins: Secondary | ICD-10-CM | POA: Diagnosis not present

## 2020-01-24 DIAGNOSIS — D500 Iron deficiency anemia secondary to blood loss (chronic): Secondary | ICD-10-CM

## 2020-01-24 DIAGNOSIS — Z803 Family history of malignant neoplasm of breast: Secondary | ICD-10-CM | POA: Insufficient documentation

## 2020-01-24 DIAGNOSIS — N6320 Unspecified lump in the left breast, unspecified quadrant: Secondary | ICD-10-CM | POA: Diagnosis not present

## 2020-01-24 DIAGNOSIS — M489 Spondylopathy, unspecified: Secondary | ICD-10-CM | POA: Diagnosis not present

## 2020-01-24 DIAGNOSIS — Z7984 Long term (current) use of oral hypoglycemic drugs: Secondary | ICD-10-CM | POA: Insufficient documentation

## 2020-01-24 DIAGNOSIS — C49A2 Gastrointestinal stromal tumor of stomach: Secondary | ICD-10-CM | POA: Diagnosis not present

## 2020-01-24 DIAGNOSIS — D649 Anemia, unspecified: Secondary | ICD-10-CM | POA: Diagnosis not present

## 2020-01-24 LAB — CBC WITH DIFFERENTIAL (CANCER CENTER ONLY)
Abs Immature Granulocytes: 0.03 10*3/uL (ref 0.00–0.07)
Basophils Absolute: 0.1 10*3/uL (ref 0.0–0.1)
Basophils Relative: 1 %
Eosinophils Absolute: 0.2 10*3/uL (ref 0.0–0.5)
Eosinophils Relative: 2 %
HCT: 31.6 % — ABNORMAL LOW (ref 36.0–46.0)
Hemoglobin: 10.1 g/dL — ABNORMAL LOW (ref 12.0–15.0)
Immature Granulocytes: 0 %
Lymphocytes Relative: 19 %
Lymphs Abs: 1.7 10*3/uL (ref 0.7–4.0)
MCH: 32.5 pg (ref 26.0–34.0)
MCHC: 32 g/dL (ref 30.0–36.0)
MCV: 101.6 fL — ABNORMAL HIGH (ref 80.0–100.0)
Monocytes Absolute: 1 10*3/uL (ref 0.1–1.0)
Monocytes Relative: 10 %
Neutro Abs: 6.2 10*3/uL (ref 1.7–7.7)
Neutrophils Relative %: 68 %
Platelet Count: 322 10*3/uL (ref 150–400)
RBC: 3.11 MIL/uL — ABNORMAL LOW (ref 3.87–5.11)
RDW: 16.3 % — ABNORMAL HIGH (ref 11.5–15.5)
WBC Count: 9.2 10*3/uL (ref 4.0–10.5)
nRBC: 0 % (ref 0.0–0.2)

## 2020-01-24 LAB — CMP (CANCER CENTER ONLY)
ALT: 18 U/L (ref 0–44)
AST: 15 U/L (ref 15–41)
Albumin: 3.7 g/dL (ref 3.5–5.0)
Alkaline Phosphatase: 48 U/L (ref 38–126)
Anion gap: 11 (ref 5–15)
BUN: 10 mg/dL (ref 8–23)
CO2: 25 mmol/L (ref 22–32)
Calcium: 9.7 mg/dL (ref 8.9–10.3)
Chloride: 106 mmol/L (ref 98–111)
Creatinine: 1.07 mg/dL — ABNORMAL HIGH (ref 0.44–1.00)
GFR, Est AFR Am: >60 mL/min (ref >60)
GFR, Estimated: 52 mL/min — ABNORMAL LOW (ref >60)
Glucose, Bld: 94 mg/dL (ref 70–99)
Potassium: 4.1 mmol/L (ref 3.5–5.1)
Sodium: 142 mmol/L (ref 135–145)
Total Bilirubin: 0.3 mg/dL (ref 0.3–1.2)
Total Protein: 6.9 g/dL (ref 6.5–8.1)

## 2020-01-24 LAB — RETIC PANEL
Immature Retic Fract: 20.5 % — ABNORMAL HIGH (ref 2.3–15.9)
RBC.: 3.14 MIL/uL — ABNORMAL LOW (ref 3.87–5.11)
Retic Count, Absolute: 91.4 10*3/uL (ref 19.0–186.0)
Retic Ct Pct: 2.9 % (ref 0.4–3.1)
Reticulocyte Hemoglobin: 35.7 pg (ref >27.9)

## 2020-01-24 LAB — SAMPLE TO BLOOD BANK

## 2020-01-24 MED ORDER — IMATINIB MESYLATE 400 MG PO TABS
400.0000 mg | ORAL_TABLET | Freq: Every day | ORAL | 1 refills | Status: DC
Start: 2020-01-24 — End: 2020-02-21

## 2020-01-24 NOTE — Progress Notes (Signed)
Met with patient and her sister in law Collenn Whitted today at her initial medical oncology consult with Dr. Burr Medico.  I explained my role as nurse navigator and she was given my card with my direct contact information.  I have encouraged her to call with any questions or concerns as they arise.  She has already had her surgical consult with Dr. Barry Dienes this morning.

## 2020-01-25 ENCOUNTER — Other Ambulatory Visit: Payer: Self-pay | Admitting: Hematology

## 2020-01-25 ENCOUNTER — Telehealth: Payer: Self-pay

## 2020-01-25 ENCOUNTER — Telehealth: Payer: Self-pay | Admitting: Pharmacist

## 2020-01-25 ENCOUNTER — Telehealth: Payer: Self-pay | Admitting: Hematology

## 2020-01-25 DIAGNOSIS — N63 Unspecified lump in unspecified breast: Secondary | ICD-10-CM

## 2020-01-25 LAB — IRON AND TIBC
Iron: 40 ug/dL — ABNORMAL LOW (ref 41–142)
Saturation Ratios: 14 % — ABNORMAL LOW (ref 21–57)
TIBC: 276 ug/dL (ref 236–444)
UIBC: 237 ug/dL (ref 120–384)

## 2020-01-25 LAB — FERRITIN: Ferritin: 430 ng/mL — ABNORMAL HIGH (ref 11–307)

## 2020-01-25 MED FILL — IMATINIB MESYLATE 400 MG TA: 400 | 30 days supply | Qty: 30 | Fill #0

## 2020-01-25 NOTE — Telephone Encounter (Signed)
Oral Oncology Pharmacist Encounter  Received new prescription for Gleevec (imatinib) for the treatment of GIST, planned duration until disease progression or unacceptable toxicity.  Prescription dose and frequency assessed for appropriateness. OK for therapy initiation.   CBC w/ Diff and CMP from 01/24/20 assessed, no relevant lab abnormalities noted.  Current medication list in Epic reviewed, DDIs with Gleevec (imatinib) identified:  Category C DDI between [place] and Diltiazem - gleevec, a CYP3A4 inhibitor, may increase serum concentrations of diltiazem - will counsel patient on monitoring for increased side effects of diltiazem including hypotension and bradycardia  Category C DDI between Gleevec and Atorvastatin - gleevec, a CYP3A4 inhibitor, may increase serum concentrations of atorvastatin- will counsel patient on monitoring for increased side effects of atorvastatin including myopathy  No dose adjustments necessary at this time  Evaluated chart and no patient barriers to medication adherence noted.   Prescription has been e-scribed to the [facility] for benefits analysis and approval.  Oral Oncology Clinic will continue to follow for insurance authorization, copayment issues, initial counseling and start date.  Leron Croak, PharmD, BCPS Hematology/Oncology Clinical Pharmacist [facility] [phone] 01/25/2020 8:07 AM

## 2020-01-25 NOTE — Telephone Encounter (Signed)
Scheduled per 8/16 los. Pt is aware of appt time and date. 

## 2020-01-25 NOTE — Telephone Encounter (Signed)
Oral Oncology Patient Advocate Encounter  Prior Authorization for [place] has been approved.    PA# [id number] Effective dates: 12/26/19 through 01/24/21  Patients co-pay is $60 for first fill and must fill at [place] for future fills  [facility] will continue to follow.   [place] Patient [place] Phone [phone] Fax [phone] 01/25/2020 11:29 AM

## 2020-01-25 NOTE — Progress Notes (Signed)
Spoke with patient regarding MRI thoracic spine which I have scheduled for 02/01/2020 at 8:00 am at [facility] to arrive by 7:30 am.  She verbalized an understanding and agreed to this appointment.  I told her that once we receive the results we will let her know.

## 2020-01-25 NOTE — Telephone Encounter (Signed)
Oral Chemotherapy Pharmacist Encounter  I spoke with patient for overview of Gleevec (imatinib) for the treatment of GIST, planned duration until disease progression or unacceptable toxicity.   Counseled patient on administration, dosing, side effects, monitoring, drug-food interactions, safe handling, storage, and disposal.  Patient will take Gleevec 400mg  tablets, 1 tablets (400mg ) by mouth once daily with a meal and a large glass of water.  Patient knows food may decrease stomach irritation and to maintain hydration while on treatment with Gleevec.  Patient counseled to avoid grapefruit and grapefruit juice.  Gleevec start date: 01/26/20  Adverse effects include but are not limited to: nausea, vomiting, diarrhea, fatigue, muscle cramps, lower extremity edema, rash, decreased blood counts, GI bleeding, and cardiac dysfunction.  Patient has anti-emetic on hand and knows to take it if nausea develops.   Patient will obtain anti diarrheal and alert the office of 4 or more loose stools above baseline.  Reviewed with patient importance of keeping a medication schedule and plan for any missed doses. No barriers to medication adherence identified.  Discussed drug-drug interactions with Gleevec and Diltiazem as well as Gleevec and Atorvastatin. Patient will monitor for increased side effects of diltiazem, including but not limited to hypotension and bradycardia as well as increased side effects of atorvastatin that include, but are not limited to myopathy.   Medication reconciliation performed and medication/allergy list updated.  This will ship from the [facility] on 01/25/20 to deliver to patient's home on 01/26/20.  Refills for Gleevec must be filled through [facility]  All questions answered.  Ms. Rieves voiced understanding and appreciation.   Patient knows to call the office with questions or concerns.  Leron Croak, PharmD, BCPS Hematology/Oncology Clinical  Pharmacist [facility] [phone] 01/25/2020 1:11 PM

## 2020-01-25 NOTE — Telephone Encounter (Signed)
Oral Oncology Patient Advocate Encounter  Received notification from [place] that prior authorization for Gleevec (Imatinib) is required.  PA submitted on CoverMyMeds Key [id number] Status is pending  Oral Oncology Clinic will continue to follow.  [place] Patient [place] Phone [phone] Fax [phone] 01/25/2020 8:33 AM

## 2020-01-31 ENCOUNTER — Other Ambulatory Visit: Payer: Self-pay | Admitting: Hematology

## 2020-01-31 DIAGNOSIS — N63 Unspecified lump in unspecified breast: Secondary | ICD-10-CM

## 2020-02-01 ENCOUNTER — Ambulatory Visit (HOSPITAL_COMMUNITY): Payer: Medicare Other

## 2020-02-02 ENCOUNTER — Other Ambulatory Visit: Payer: Self-pay | Admitting: Hematology

## 2020-02-02 ENCOUNTER — Other Ambulatory Visit: Payer: Self-pay

## 2020-02-02 DIAGNOSIS — C49A2 Gastrointestinal stromal tumor of stomach: Secondary | ICD-10-CM

## 2020-02-02 NOTE — Progress Notes (Signed)
Spoke with patient to let her know recommendation from Tumor Board was to obtain PET scan to further evaluate thoracic lesion.  It has already been approved and scheduled for 9/3.  She has already received a phone call regarding this.

## 2020-02-07 ENCOUNTER — Encounter (HOSPITAL_COMMUNITY): Payer: Self-pay | Admitting: Hematology

## 2020-02-07 ENCOUNTER — Ambulatory Visit
Admission: RE | Admit: 2020-02-07 | Discharge: 2020-02-07 | Disposition: A | Discharge status: Home / Self Care | Payer: Medicare Other | Source: Ambulatory Visit | Attending: Hematology | Admitting: Hematology

## 2020-02-07 ENCOUNTER — Other Ambulatory Visit: Payer: Self-pay

## 2020-02-07 DIAGNOSIS — R928 Other abnormal and inconclusive findings on diagnostic imaging of breast: Secondary | ICD-10-CM | POA: Diagnosis not present

## 2020-02-07 DIAGNOSIS — N63 Unspecified lump in unspecified breast: Secondary | ICD-10-CM

## 2020-02-07 DIAGNOSIS — N6002 Solitary cyst of left breast: Secondary | ICD-10-CM | POA: Diagnosis not present

## 2020-02-07 IMAGING — MG DIGITAL DIAGNOSTIC BILAT W/ TOMO W/ CAD
8 series · 8 of 24 positions shown · non-contrast
Comparison: None.

CLINICAL DATA: Left breast mass seen on CT imaging.

EXAM:
DIGITAL DIAGNOSTIC BILATERAL MAMMOGRAM WITH CAD AND TOMO
ULTRASOUND LEFT BREAST

[R CC synth-2D]
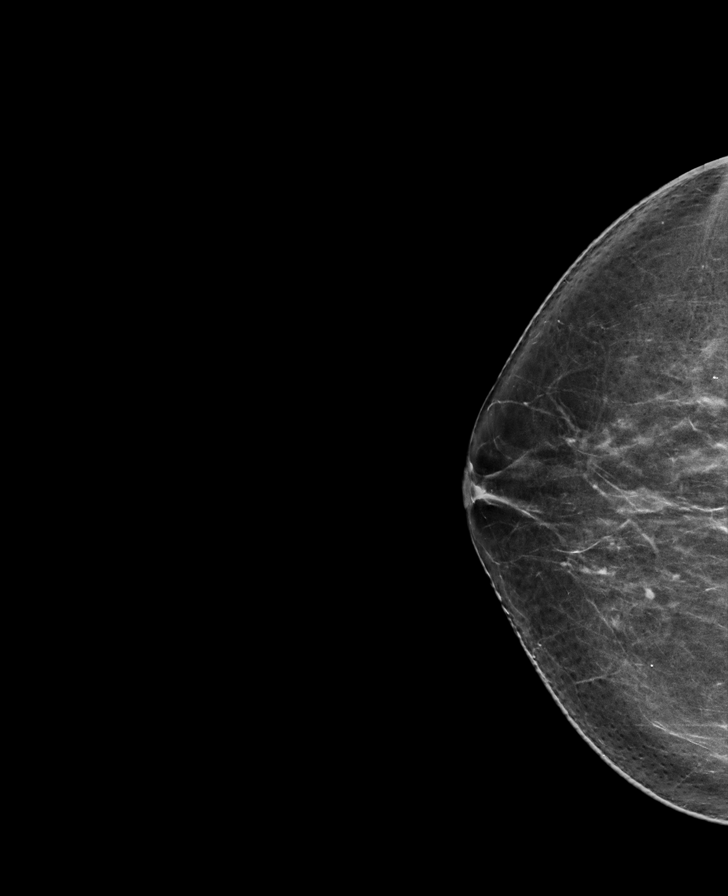

[L MLO synth-2D]
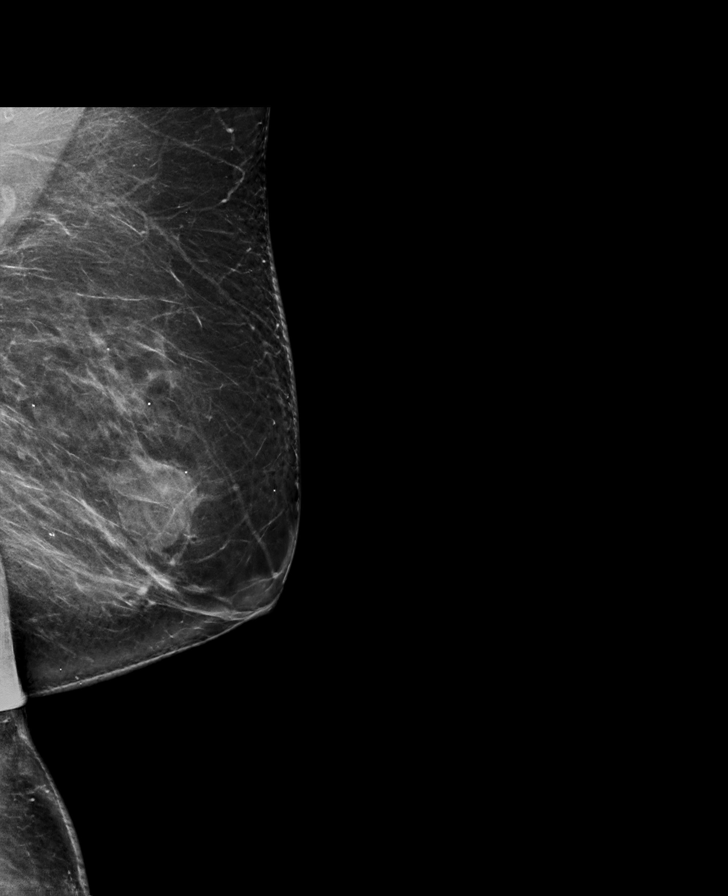

[L CC synth-2D]
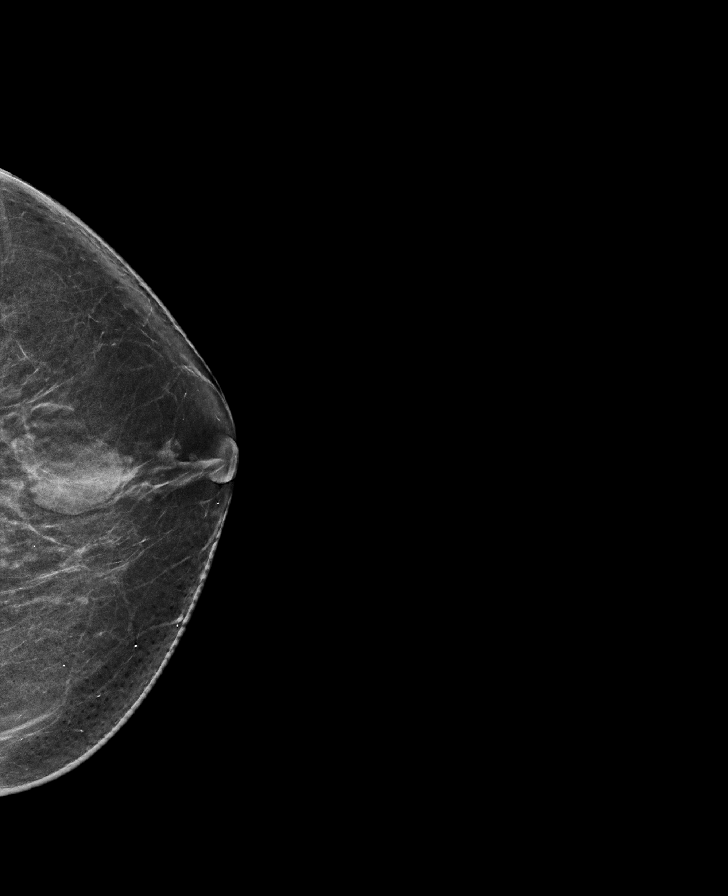

[R MLO synth-2D]
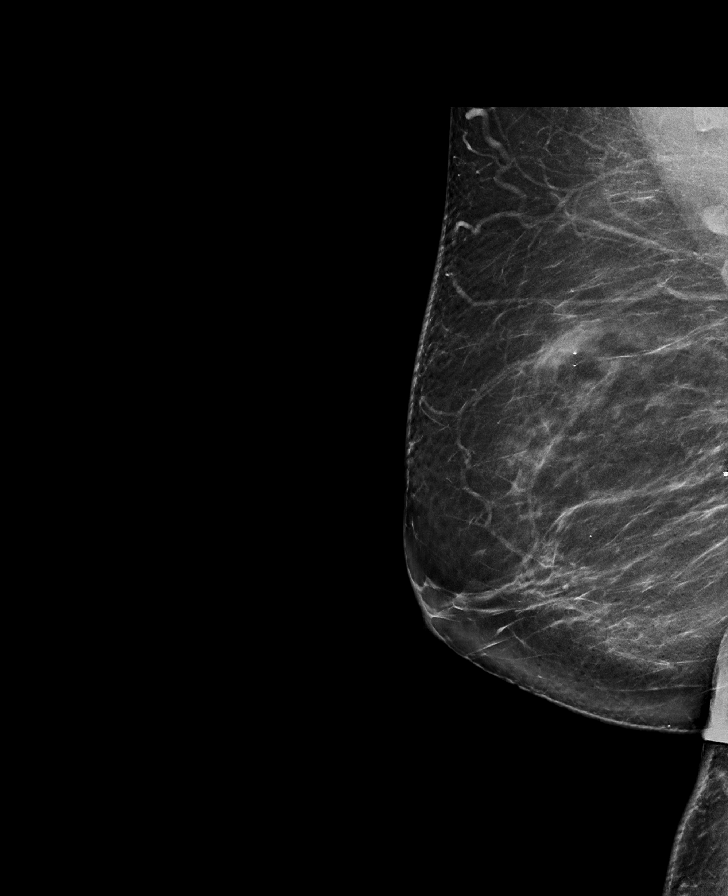

[R MLO tomo · tomo slice 42/83.0]
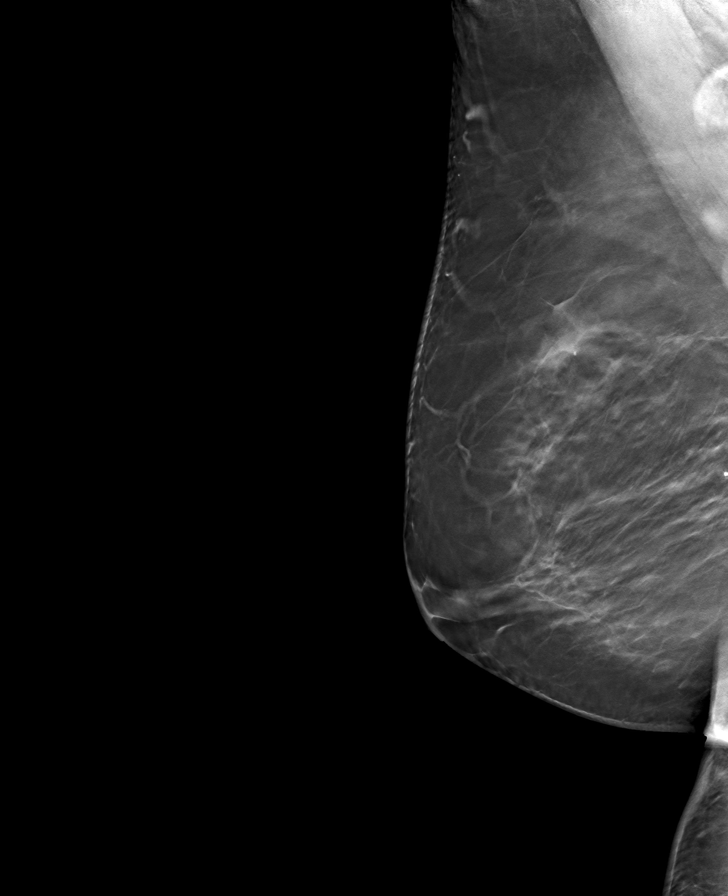

[L MLO tomo · tomo slice 41/82.0]
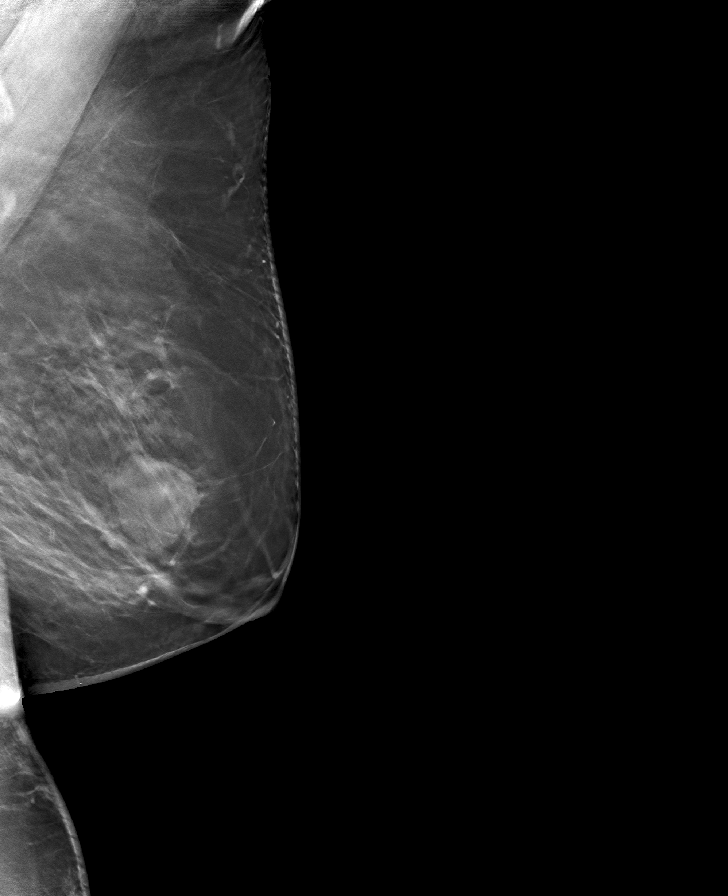

[R CC tomo · tomo slice 37/72.0]
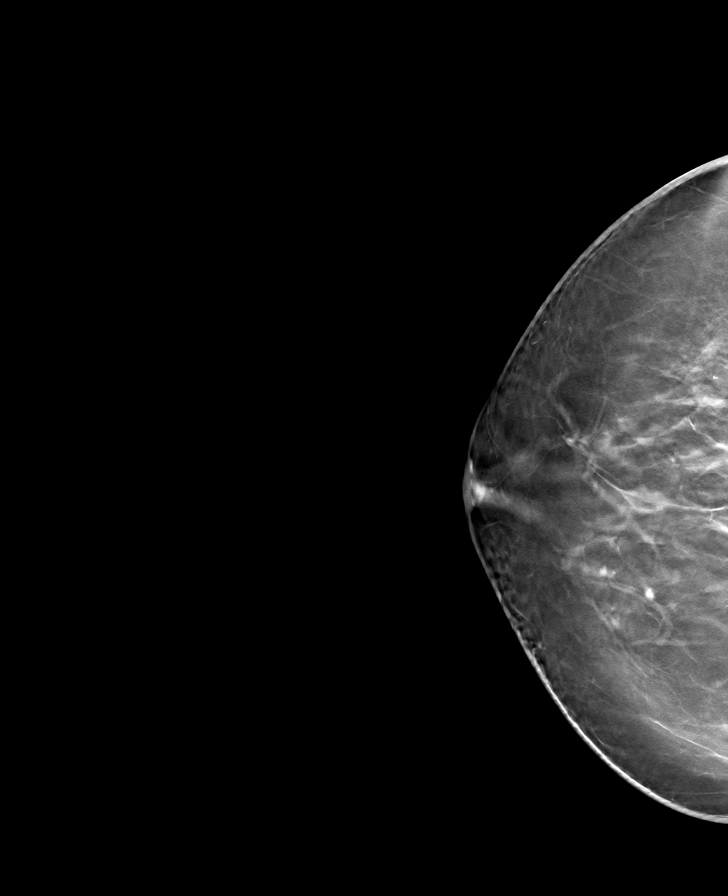

[L CC tomo · tomo slice 37/72.0]
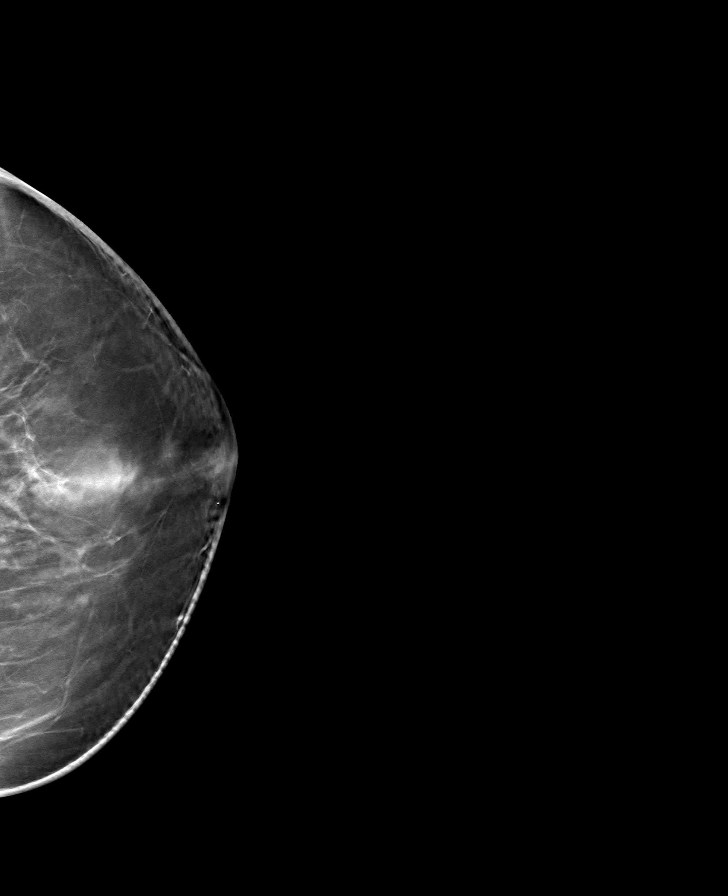

[8 of 24 positions shown; findings below may reference images not displayed]

ACR Breast Density Category b: There are scattered areas of
fibroglandular density.
FINDINGS: There is a mass in the left breast at 12 o'clock correlating with
the mammographic finding. No other suspicious findings are seen in
either breast.

Mammographic images were processed with CAD.

On physical exam, no suspicious lumps are identified.

Targeted ultrasound is performed, showing a cyst in the left breast
at 12 o'clock, 1 cm from the nipple correlating with the
mammographic finding.
IMPRESSION: Fibrocystic changes.  No evidence of malignancy.

RECOMMENDATION:
Annual screening mammography.

I have discussed the findings and recommendations with the patient.
If applicable, a reminder letter will be sent to the patient
regarding the next appointment.

BI-RADS CATEGORY  2: Benign.

## 2020-02-07 IMAGING — US US BREAST*L* LIMITED INC AXILLA
1 series · 7 of 7 positions shown · non-contrast
Comparison: None.

CLINICAL DATA: Left breast mass seen on CT imaging.

EXAM:
DIGITAL DIAGNOSTIC BILATERAL MAMMOGRAM WITH CAD AND TOMO
ULTRASOUND LEFT BREAST

[Series 1: us breast*left* limited inc axilla · 0.08mm/px · 7 of 7 slices shown]
[im 1/7]
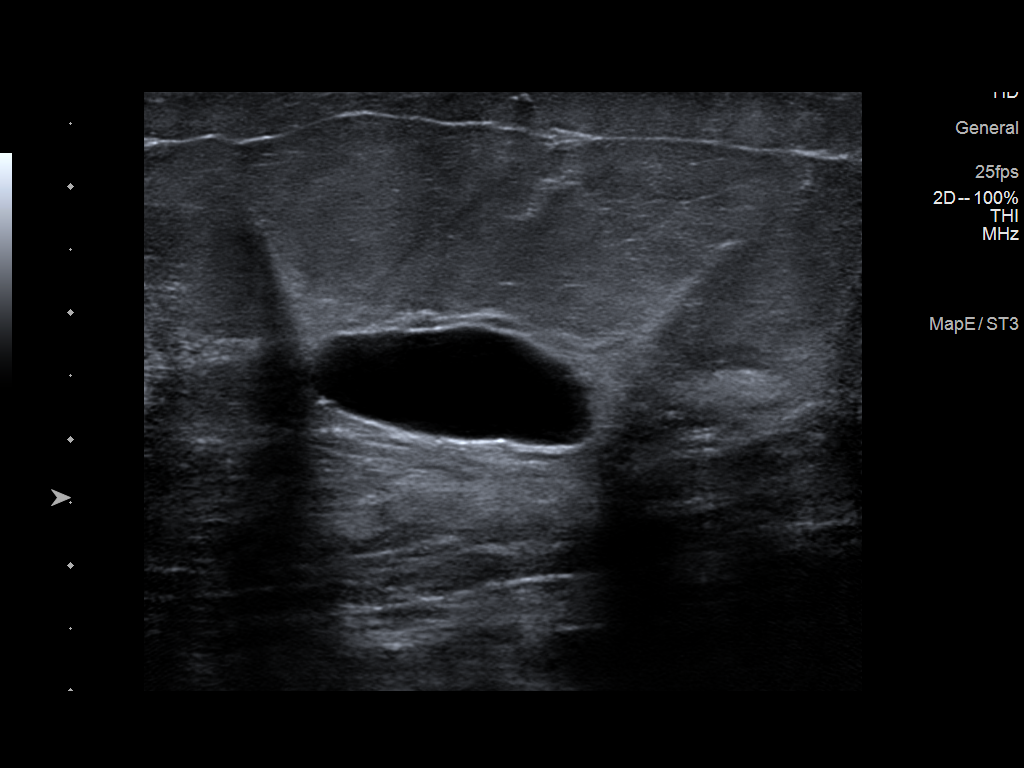
[im 2/7]
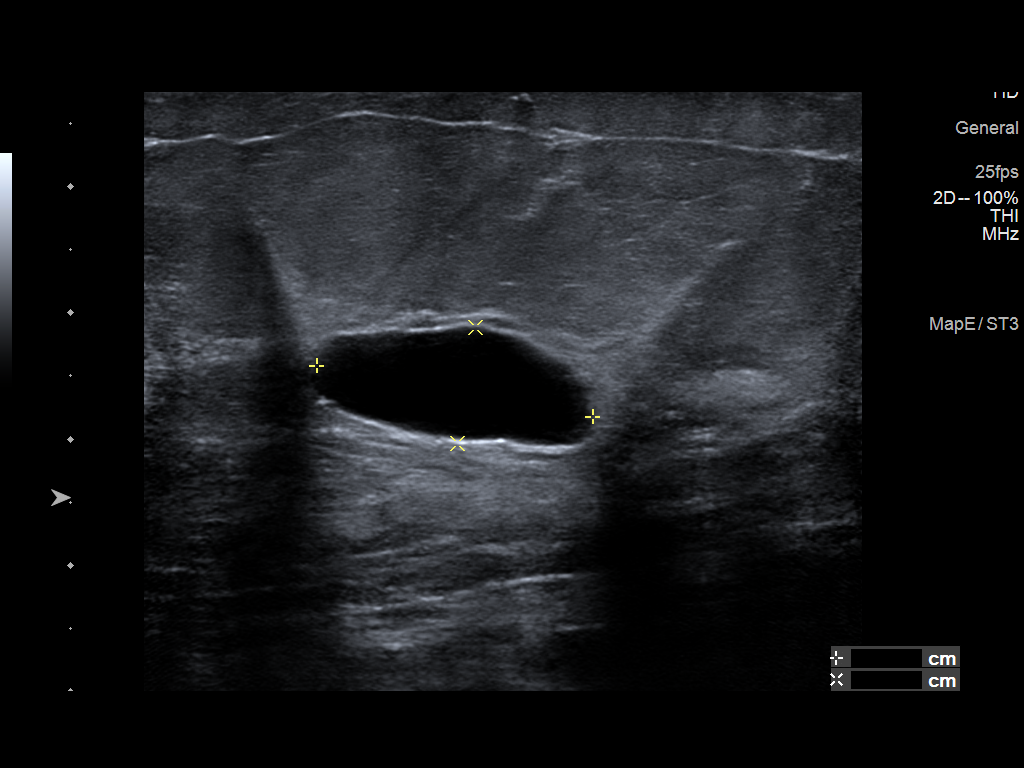
[im 3/7]
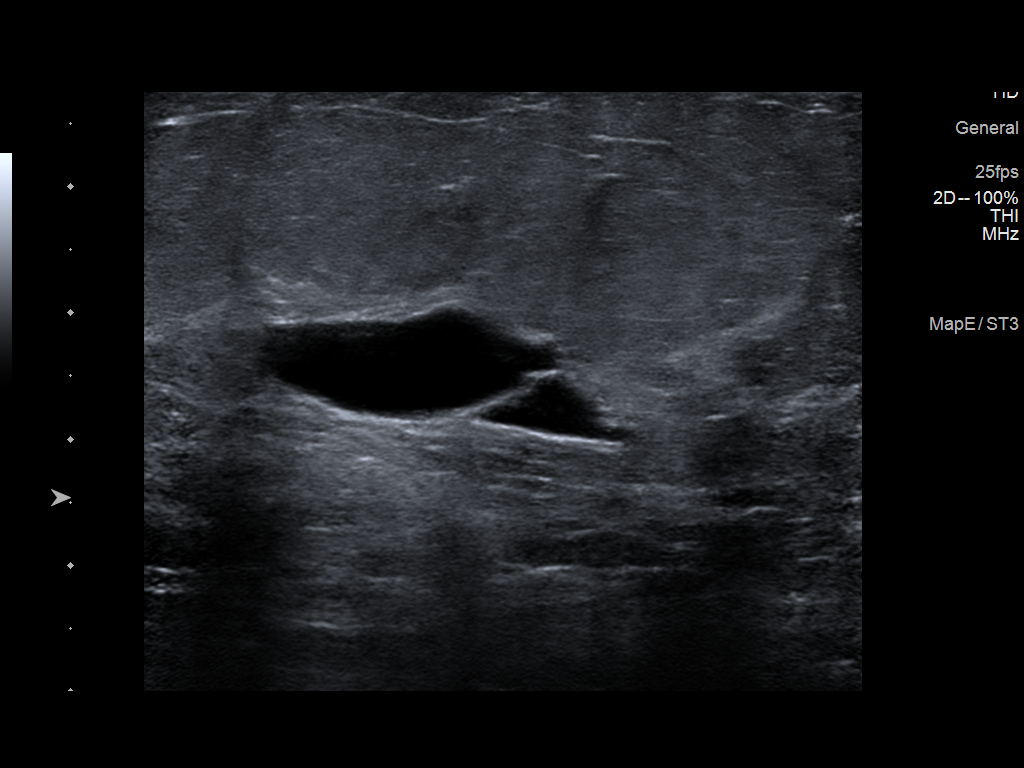
[im 4/7]
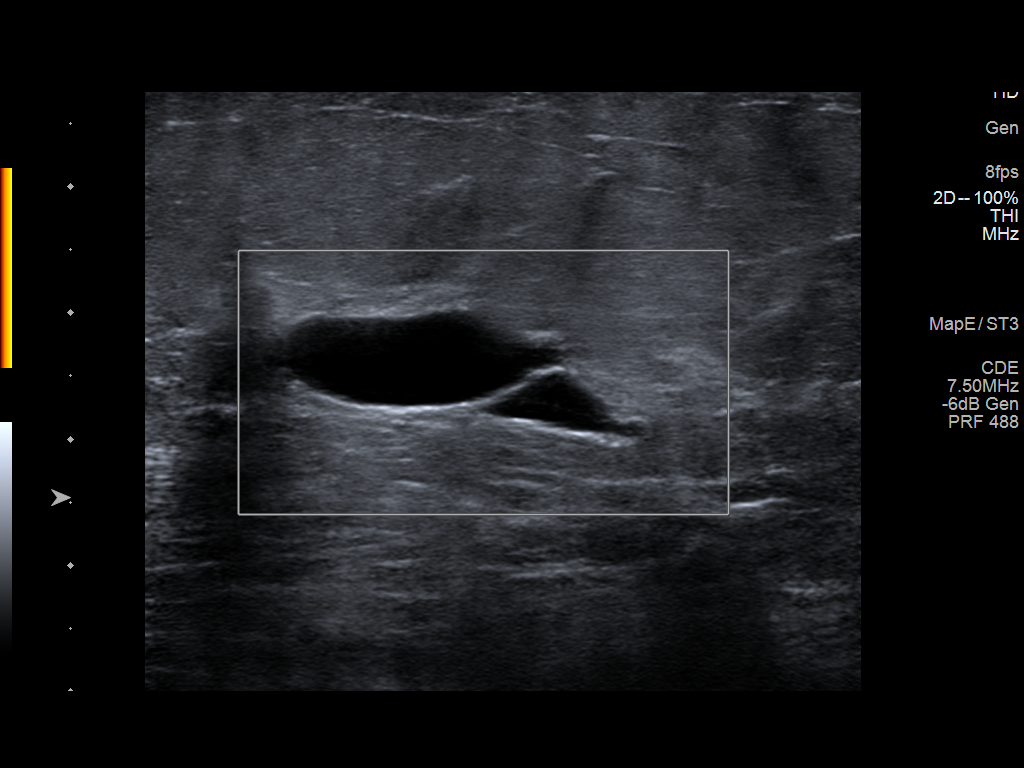
[im 5/7]
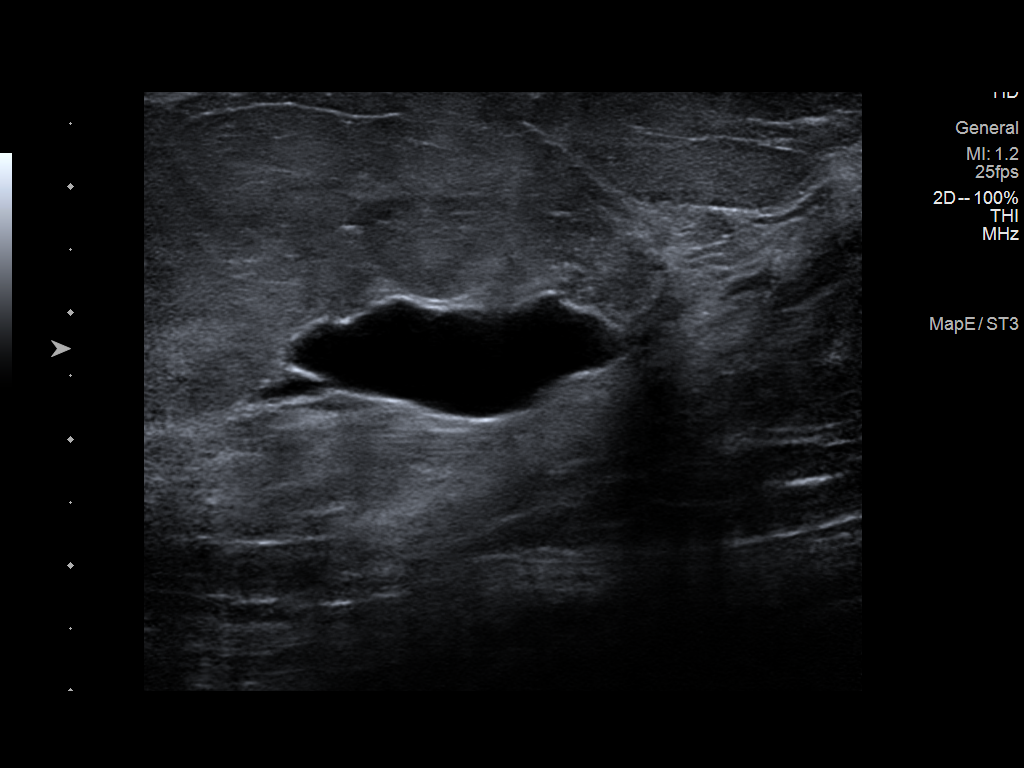
[im 6/7]
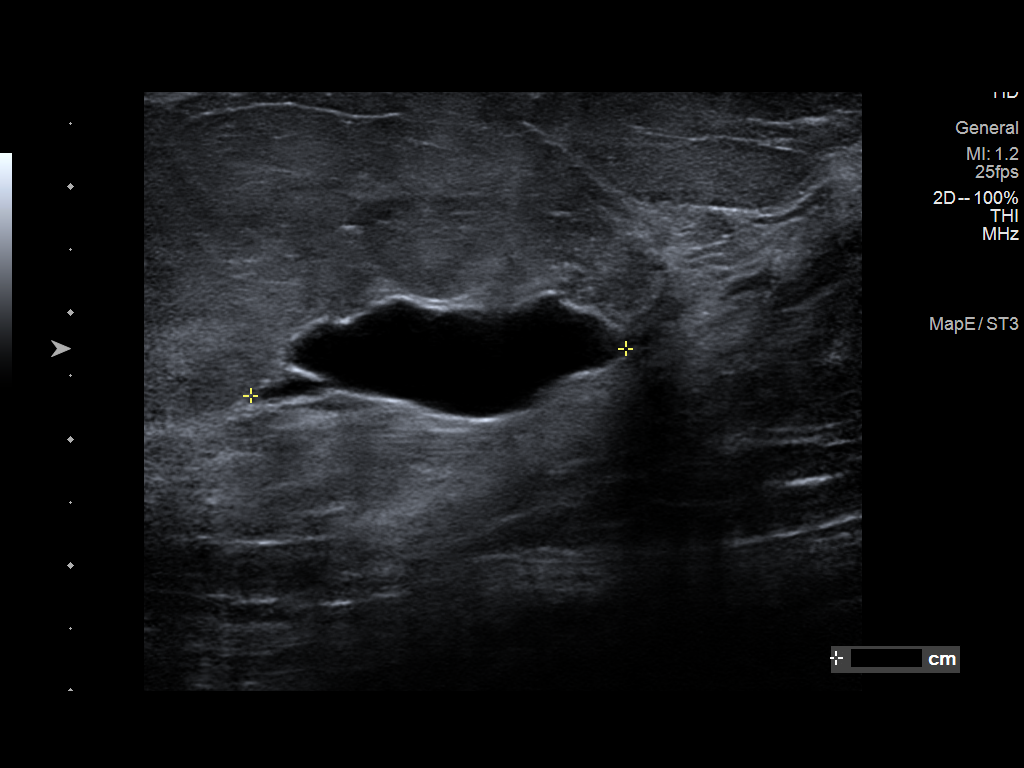
[im 7/7]
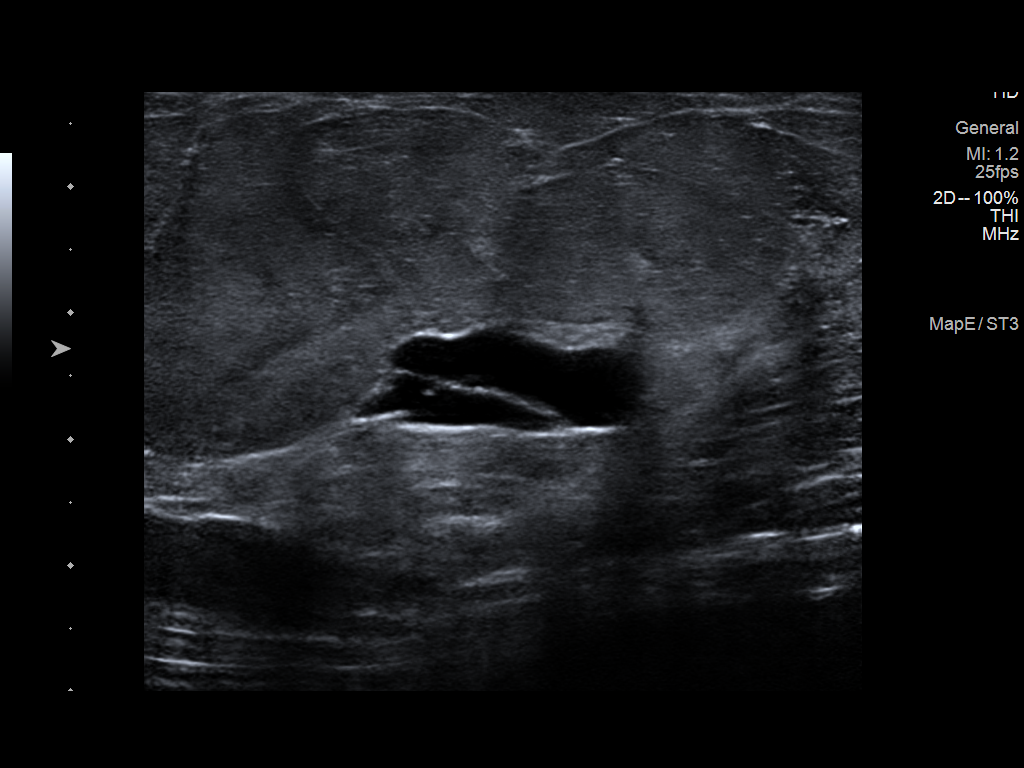

[7 of 7 positions shown; findings below may reference images not displayed]

ACR Breast Density Category b: There are scattered areas of
fibroglandular density.
FINDINGS: There is a mass in the left breast at 12 o'clock correlating with
the mammographic finding. No other suspicious findings are seen in
either breast.

Mammographic images were processed with CAD.

On physical exam, no suspicious lumps are identified.

Targeted ultrasound is performed, showing a cyst in the left breast
at 12 o'clock, 1 cm from the nipple correlating with the
mammographic finding.
IMPRESSION: Fibrocystic changes.  No evidence of malignancy.

RECOMMENDATION:
Annual screening mammography.

I have discussed the findings and recommendations with the patient.
If applicable, a reminder letter will be sent to the patient
regarding the next appointment.

BI-RADS CATEGORY  2: Benign.

## 2020-02-07 NOTE — Progress Notes (Signed)
[place]   [phone] [phone]) [phone]   Clinic Follow up Note   Patient Care Team: [name], MD as PCP - Charissa Bash, MD as Consulting Physician (Oncology) Jonnie Finner, RN as Oncology Nurse Navigator Stark Klein, MD as Consulting Physician (General Surgery) Milus Banister, MD as Attending Physician (Gastroenterology)  Date of Service:  02/09/2020  CHIEF COMPLAINT: F/u of GIST  SUMMARY OF ONCOLOGIC HISTORY: Oncology History Overview Note  Cancer Staging No matching staging information was found for the patient.    Malignant gastrointestinal stromal tumor (GIST) of stomach ([place])  01/13/2020 Procedure   EGD/Upper Endoscopy by Dr Ardis Hughs 01/13/20  IMPRESSION - There was an ulcerated mass along the greater curvature of the stomach, proximal edge was about 5-6cm from the GE junction and the mass was 7-8cm long, 2-3cm wide. It was not circumferental. The mass was friable with spontaneous oozing, contact oozing, ulcerated and heaped up. Some parts of the mass were quite soft whereas other areas were more firm. This was very suspicious for a malignancy and it was biopsied extensively. There was mild distal gastritis, biopsied to check for H. pylori. - The examination was otherwise normal.   01/13/2020 Initial Biopsy   FINAL MICROSCOPIC DIAGNOSIS:  A. STOMACH, BIOPSY:  - High grade gastrointestinal stromal tumor, see comment.   B. STOMACH, DISTAL, BIOPSY:  - Mild reactive gastropathy with erosions.  - [place]-Starry is negative for Helicobacter pylori.  - No intestinal metaplasia, dysplasia, or malignancy.  COMMENT:  A. The tumor has spindle and epithelioid components and appears high  grade (7 mitosis/20 hpf). Immunohistochemistry is positive for CD117 and  CD34. The cells are negative for cytokeratin 20, cytokeratin 7,  cytokeratin 5/6, CDX-2, SMA, and desmin. The findings are consistent  with a gastrointestinal stromal tumor. Dr.  Vic Ripper has reviewed the  case. Dr. Ardis Hughs' nurse was notified on 01/18/2020.     01/13/2020 Imaging   CT CAP W contrast 01/13/20  IMPRESSION: 1. 2.1 cm x 1.1 cm well-defined area of low attenuation within the soft tissues of the left breast. This may represent a lymph node, however, correlation with mammography is recommended. 2. 6.5 mm sclerotic focus within the posterior aspect of the T7 vertebral body. This may represent a bone island, however, correlation with follow-up abdomen pelvis CT is recommended to exclude the presence of a metastatic lesion. 3. Small hiatal hernia. 4. 2.6 cm x 2.0 cm fat containing umbilical hernia. 5. Aortic atherosclerosis.    01/23/2020 Initial Diagnosis   Malignant gastrointestinal stromal tumor (GIST) of stomach ([place])   01/26/2020 -  Chemotherapy   Gleevec 435m once daily starting 01/26/20       CURRENT THERAPY:  Gleevec 4057monce daily starting 01/27/20  INTERVAL HISTORY:  Bridget Grant here for a follow up. She presents to the clinic alone. She is doing well overall, tolerating Gleevec well, just had one episode of nausea, occasional diarrhea, no other issues. No melena or hematochezia.  Mild fatigue, functions well at home, appetite good. ROS otherwise negative     MEDICAL HISTORY:  Past Medical History:  Diagnosis Date  . Anxiety   . Depression   . Hyperlipidemia   . Hypertension   . Migraine   . Osteopenia   . Type II or unspecified type diabetes mellitus without mention of complication, uncontrolled 04/09/2013  . Upper GI bleed 01/13/2020    SURGICAL HISTORY: Past Surgical History:  Procedure Laterality Date  . ABDOMINAL HYSTERECTOMY    .  BIOPSY  01/13/2020   Procedure: BIOPSY;  Surgeon: Milus Banister, MD;  Location: [facility] ENDOSCOPY;  Service: Endoscopy;;  . CHOLECYSTECTOMY    . ESOPHAGOGASTRODUODENOSCOPY (EGD) WITH PROPOFOL N/A 01/13/2020   Procedure: ESOPHAGOGASTRODUODENOSCOPY (EGD) WITH PROPOFOL;  Surgeon: Milus Banister, MD;  Location: [facility] ENDOSCOPY;  Service: Endoscopy;  Laterality: N/A;    I have reviewed the social history and family history with the patient and they are unchanged from previous note.  ALLERGIES:  has No Known Allergies.  MEDICATIONS:  Current Outpatient Medications  Medication Sig Dispense Refill  . atorvastatin (LIPITOR) 20 MG tablet TAKE 1 TABLET BY MOUTH EVERY DAY (Patient taking differently: Take 20 mg by mouth daily. ) 90 tablet 3  . diltiazem (CARDIZEM CD) 240 MG 24 hr capsule TAKE 1 CAPSULE(240 MG) BY MOUTH DAILY (Patient taking differently: Take 240 mg by mouth daily. ) 90 capsule 3  . glipiZIDE (GLUCOTROL XL) 10 MG 24 hr tablet TAKE 1 TABLET BY MOUTH DAILY WITH BREAKFAST (Patient taking differently: Take 10 mg by mouth daily with breakfast. ) 90 tablet 0  . hydrochlorothiazide (MICROZIDE) 12.5 MG capsule Take 1 capsule (12.5 mg total) by mouth daily. 90 capsule 3  . imatinib (GLEEVEC) 400 MG tablet Take 1 tablet (400 mg total) by mouth daily. Take with meals and large glass of water.Caution:Chemotherapy. 30 tablet 1  . lisinopril (ZESTRIL) 20 MG tablet TAKE 1 TABLET BY MOUTH EVERY DAY (Patient taking differently: Take 20 mg by mouth at bedtime. ) 90 tablet 3  . metFORMIN (GLUCOPHAGE-XR) 500 MG 24 hr tablet TAKE 4 TABLETS(2000 MG) BY MOUTH DAILY WITH BREAKFAST (Patient taking differently: Take 1,500 mg by mouth daily with breakfast. ) 360 tablet 1  . pantoprazole (PROTONIX) 40 MG tablet Take 1 tablet (40 mg total) by mouth 2 (two) times daily before a meal. 60 tablet 0  . pioglitazone (ACTOS) 15 MG tablet Take 1 tablet (15 mg total) by mouth daily. 90 tablet 3  . vitamin B-12 (CYANOCOBALAMIN) 1000 MCG tablet Take 1 tablet (1,000 mcg total) by mouth daily. 90 tablet 3  . Vitamin D, Ergocalciferol, (DRISDOL) 1.25 MG (50000 UNIT) CAPS capsule Take 1 capsule (50,000 Units total) by mouth every 7 (seven) days. (Patient taking differently: Take 50,000 Units by mouth every Sunday. ) 12  capsule 0   No current facility-administered medications for this visit.    PHYSICAL EXAMINATION: ECOG PERFORMANCE STATUS: 1 - Symptomatic but completely ambulatory  Vitals:   02/09/20 0806  BP: (!) 152/69  Pulse: 76  Resp: 16  Temp: 98.6 F (37 C)  SpO2: 98%   Filed Weights   02/09/20 0806  Weight: 203 lb 11.2 oz (92.4 kg)    GENERAL:alert, no distress and comfortable SKIN: skin color, texture, turgor are normal, no rashes or significant lesions EYES: normal, Conjunctiva are pink and non-injected, sclera clear Musculoskeletal:no cyanosis of digits and no clubbing  NEURO: alert & oriented x 3 with fluent speech, no focal motor/sensory deficits  LABORATORY DATA:  I have reviewed the data as listed CBC Latest Ref Rng & Units 02/09/2020 01/24/2020 01/16/2020  WBC 4.0 - 10.5 K/uL 9.0 9.2 10.3  Hemoglobin 12.0 - 15.0 g/dL 11.4(L) 10.1(L) 8.4(L)  Hematocrit 36 - 46 % 35.1(L) 31.6(L) 26.7(L)  Platelets 150 - 400 K/uL 269 322 240     CMP Latest Ref Rng & Units 01/24/2020 01/14/2020 01/12/2020  Glucose 70 - 99 mg/dL 94 134(H) 138(H)  BUN 8 - 23 mg/dL 10 17 34(H)  Creatinine  0.44 - 1.00 mg/dL 1.07(H) 0.75 0.86  Sodium 135 - 145 mmol/L 142 142 140  Potassium 3.5 - 5.1 mmol/L 4.1 4.1 4.0  Chloride 98 - 111 mmol/L 106 110 104  CO2 22 - 32 mmol/L 25 23 26   Calcium 8.9 - 10.3 mg/dL 9.7 8.0(L) 9.0  Total Protein 6.5 - 8.1 g/dL 6.9 - 6.5  Total Bilirubin 0.3 - 1.2 mg/dL 0.3 - 0.7  Alkaline Phos 38 - 126 U/L 48 - 37(L)  AST 15 - 41 U/L 15 - 25  ALT 0 - 44 U/L 18 - 24      RADIOGRAPHIC STUDIES: I have personally reviewed the radiological images as listed and agreed with the findings in the report. US BREAST LTD UNI LEFT INC AXILLA  Result Date: 02/07/2020 CLINICAL DATA:  Left breast mass seen on CT imaging. EXAM: DIGITAL DIAGNOSTIC BILATERAL MAMMOGRAM WITH CAD AND TOMO ULTRASOUND LEFT BREAST COMPARISON:  None. ACR Breast Density Category b: There are scattered areas of fibroglandular  density. FINDINGS: There is a mass in the left breast at 12 o'clock correlating with the mammographic finding. No other suspicious findings are seen in either breast. Mammographic images were processed with CAD. On physical exam, no suspicious lumps are identified. Targeted ultrasound is performed, showing a cyst in the left breast at 12 o'clock, 1 cm from the nipple correlating with the mammographic finding. IMPRESSION: Fibrocystic changes.  No evidence of malignancy. RECOMMENDATION: Annual screening mammography. I have discussed the findings and recommendations with the patient. If applicable, a reminder letter will be sent to the patient regarding the next appointment. BI-RADS CATEGORY  2: Benign. Electronically Signed   By: Dorise Bullion III M.D   On: 02/07/2020 14:56   MM DIAG BREAST TOMO BILATERAL  Result Date: 02/07/2020 CLINICAL DATA:  Left breast mass seen on CT imaging. EXAM: DIGITAL DIAGNOSTIC BILATERAL MAMMOGRAM WITH CAD AND TOMO ULTRASOUND LEFT BREAST COMPARISON:  None. ACR Breast Density Category b: There are scattered areas of fibroglandular density. FINDINGS: There is a mass in the left breast at 12 o'clock correlating with the mammographic finding. No other suspicious findings are seen in either breast. Mammographic images were processed with CAD. On physical exam, no suspicious lumps are identified. Targeted ultrasound is performed, showing a cyst in the left breast at 12 o'clock, 1 cm from the nipple correlating with the mammographic finding. IMPRESSION: Fibrocystic changes.  No evidence of malignancy. RECOMMENDATION: Annual screening mammography. I have discussed the findings and recommendations with the patient. If applicable, a reminder letter will be sent to the patient regarding the next appointment. BI-RADS CATEGORY  2: Benign. Electronically Signed   By: Dorise Bullion III M.D   On: 02/07/2020 14:56     ASSESSMENT & PLAN:  Bridget Grant is a 74 y.o. female with    1. Gastric  GIST, [id number],  High grade, Kit mutation (+)  -She was recently diagnosed in 01/2020. After presenting with severe upper GI bleeding her work up with Endoscopy showed ulcerated mass along the greater curvature of the stomach, proximal edge was about 5-6cm from the GE junction and 7-8cm long and 2-3cm wide. The path showed High grade gastrointestinal stromal tumor.  -Her staging CT CAP from 01/13/20 did not show gastric mass (I think the greater curvature of gastric wall is thickened)  but did show incidental findings of left breast mass and 6.69m sclerotic lesion at T7.  -Her diagnostic mammogram and ultrasound showed a 1 centimeter cyst in the left breast, no  other evidence of malignancy.  I discussed with patient.  The left breast mass on CT scan was 2.1cm,  will review with Dr. Barry Dienes also to see if we need breast MRI (unfortunatley can not be done in the next month due to her iv iron) -We have discussed her case in GI tumor conference, and recommend a PET scan.  It is scheduled for later this week. -Given her bleeding tumor, large size and other work up, I recommend she start [place] before surgery while she waits.  -She started Gleevec 413m once daily on 01/27/20. She is tolerating it very well. Plan to continue for 3 years to reduce her moderate risk of GIST recurrence.  -If PET scan is negative for metastatic disease, Dr. BBarry Dieneswill probably schedule her partial gastrectomy in near future.  -Her tumor was tested for KIT mutation which showed positive mutation in exon 11.  This predicts good response to imatinib. -Continue Gleevec, I will see her back in 2 months   2. Anemia secondary to GI bleeding from #1, B12 deficiency  -She was hospitalized on 01/12/20 due to severe anemia with Hg 7.9.  -She was treated with blood transfusion on 01/17/20 and IV Feraheme on 01/15/20.  -She has been on Monthly B12 injections since 05/2019 due to her B12 deficiency (B12 was 163).  Due to high level of B12, her  primary care physician stopped her injection in June 2021, I recommend her to start oral B12, and I will check her level for next visit. -We will proceed to second dose of Feraheme today  3. Left Breast Mass -Her 01/13/20 CT scan showed 2.1cm left breast mass. She has not had mammogram in several years.  -Her diagnostic mammogram and ultrasound was negative except a 1 cm cyst in the left breast  4. T7 Spinal Lesion  -Her 01/13/20 CT scan also shows a 6.5 mm sclerotic focus within the posterior aspect of the T7 vertebral body.  -If she is found to have breast cancer, I would biopsy her bone lesion to evaluate for bone metastasis.  -She will proceed with PET on 02/11/20 for further evaluation  5. Social Support -She lives with her husband and has no children.  -Her husband is paralyzed from tumor on spine. She is his caregiver and he is able to get around by powered chair.  -She has support from her sister-in-law and brother.  -If high co-pay for Gleevec I reviewed [facility] resources that can help her gain financial assistance.    PLAN:  -I reviewed her lab, and recent mammogram and ultrasound results -We will proceed to second dose IV Feraheme today -Lab and follow-up in 4 weeks-copy Dr. BBarry Dienes   No problem-specific Assessment & Plan notes found for this encounter.   No orders of the defined types were placed in this encounter.  All questions were answered. The patient knows to call the clinic with any problems, questions or concerns. No barriers to learning was detected. The total time spent in the appointment was 30 minutes.     YTruitt Merle MD 02/09/2020   I, AJoslyn Devon am acting as scribe for YTruitt Merle MD.   I have reviewed the above documentation for accuracy and completeness, and I agree with the above.

## 2020-02-09 ENCOUNTER — Inpatient Hospital Stay: Payer: Medicare Other | Attending: Hematology | Admitting: Hematology

## 2020-02-09 ENCOUNTER — Other Ambulatory Visit: Payer: Self-pay

## 2020-02-09 ENCOUNTER — Inpatient Hospital Stay: Payer: Medicare Other

## 2020-02-09 ENCOUNTER — Encounter: Payer: Self-pay | Admitting: Hematology

## 2020-02-09 VITALS — BP 152/69 | HR 76 | Temp 98.6°F | Resp 16 | Ht 62.0 in | Wt 203.7 lb

## 2020-02-09 VITALS — BP 131/56 | HR 61 | Resp 18

## 2020-02-09 DIAGNOSIS — D500 Iron deficiency anemia secondary to blood loss (chronic): Secondary | ICD-10-CM

## 2020-02-09 DIAGNOSIS — C49A2 Gastrointestinal stromal tumor of stomach: Secondary | ICD-10-CM | POA: Insufficient documentation

## 2020-02-09 DIAGNOSIS — E538 Deficiency of other specified B group vitamins: Secondary | ICD-10-CM | POA: Diagnosis not present

## 2020-02-09 DIAGNOSIS — K922 Gastrointestinal hemorrhage, unspecified: Secondary | ICD-10-CM | POA: Insufficient documentation

## 2020-02-09 LAB — CBC WITH DIFFERENTIAL (CANCER CENTER ONLY)
Abs Immature Granulocytes: 0.02 10*3/uL (ref 0.00–0.07)
Basophils Absolute: 0.1 10*3/uL (ref 0.0–0.1)
Basophils Relative: 1 %
Eosinophils Absolute: 0.5 10*3/uL (ref 0.0–0.5)
Eosinophils Relative: 6 %
HCT: 35.1 % — ABNORMAL LOW (ref 36.0–46.0)
Hemoglobin: 11.4 g/dL — ABNORMAL LOW (ref 12.0–15.0)
Immature Granulocytes: 0 %
Lymphocytes Relative: 23 %
Lymphs Abs: 2.1 10*3/uL (ref 0.7–4.0)
MCH: 32.2 pg (ref 26.0–34.0)
MCHC: 32.5 g/dL (ref 30.0–36.0)
MCV: 99.2 fL (ref 80.0–100.0)
Monocytes Absolute: 0.8 10*3/uL (ref 0.1–1.0)
Monocytes Relative: 9 %
Neutro Abs: 5.5 10*3/uL (ref 1.7–7.7)
Neutrophils Relative %: 61 %
Platelet Count: 269 10*3/uL (ref 150–400)
RBC: 3.54 MIL/uL — ABNORMAL LOW (ref 3.87–5.11)
RDW: 14.8 % (ref 11.5–15.5)
WBC Count: 9 10*3/uL (ref 4.0–10.5)
nRBC: 0 % (ref 0.0–0.2)

## 2020-02-09 LAB — RETIC PANEL
Immature Retic Fract: 8.9 % (ref 2.3–15.9)
RBC.: 3.45 MIL/uL — ABNORMAL LOW (ref 3.87–5.11)
Retic Count, Absolute: 36.2 10*3/uL (ref 19.0–186.0)
Retic Ct Pct: 1.1 % (ref 0.4–3.1)
Reticulocyte Hemoglobin: 37.9 pg (ref >27.9)

## 2020-02-09 MED ORDER — SODIUM CHLORIDE 0.9 % IV SOLN
510.0000 mg | Freq: Once | INTRAVENOUS | Status: AC
  Administered 2020-02-09: 510 mg via INTRAVENOUS
  Filled 2020-02-09: qty 510

## 2020-02-09 MED ORDER — LORATADINE 10 MG PO TABS
10.0000 mg | ORAL_TABLET | Freq: Once | ORAL | Status: AC
  Administered 2020-02-09: 10 mg via ORAL

## 2020-02-09 MED ORDER — SODIUM CHLORIDE 0.9 % IV SOLN
Freq: Once | INTRAVENOUS | Status: AC
  Filled 2020-02-09: qty 250

## 2020-02-09 MED ORDER — ACETAMINOPHEN 325 MG PO TABS
ORAL_TABLET | ORAL | Status: AC
  Filled 2020-02-09: qty 2

## 2020-02-09 MED ORDER — LORATADINE 10 MG PO TABS
ORAL_TABLET | ORAL | Status: AC
  Filled 2020-02-09: qty 1

## 2020-02-09 MED ORDER — ACETAMINOPHEN 325 MG PO TABS
650.0000 mg | ORAL_TABLET | Freq: Once | ORAL | Status: AC
  Administered 2020-02-09: 650 mg via ORAL

## 2020-02-09 NOTE — Patient Instructions (Signed)

## 2020-02-11 ENCOUNTER — Ambulatory Visit (HOSPITAL_COMMUNITY)
Admission: RE | Admit: 2020-02-11 | Discharge: 2020-02-11 | Disposition: A | Discharge status: Home / Self Care | Payer: Medicare Other | Source: Ambulatory Visit | Attending: Hematology | Admitting: Hematology

## 2020-02-11 ENCOUNTER — Other Ambulatory Visit: Payer: Self-pay

## 2020-02-11 DIAGNOSIS — R918 Other nonspecific abnormal finding of lung field: Secondary | ICD-10-CM | POA: Diagnosis not present

## 2020-02-11 DIAGNOSIS — C49A2 Gastrointestinal stromal tumor of stomach: Secondary | ICD-10-CM

## 2020-02-11 DIAGNOSIS — I517 Cardiomegaly: Secondary | ICD-10-CM | POA: Diagnosis not present

## 2020-02-11 DIAGNOSIS — K5730 Diverticulosis of large intestine without perforation or abscess without bleeding: Secondary | ICD-10-CM | POA: Insufficient documentation

## 2020-02-11 DIAGNOSIS — K429 Umbilical hernia without obstruction or gangrene: Secondary | ICD-10-CM | POA: Insufficient documentation

## 2020-02-11 DIAGNOSIS — I2510 Atherosclerotic heart disease of native coronary artery without angina pectoris: Secondary | ICD-10-CM | POA: Diagnosis not present

## 2020-02-11 DIAGNOSIS — I700 Atherosclerosis of aorta: Secondary | ICD-10-CM | POA: Insufficient documentation

## 2020-02-11 LAB — GLUCOSE, CAPILLARY: Glucose-Capillary: 105 mg/dL — ABNORMAL HIGH (ref 70–99)

## 2020-02-11 IMAGING — CT NM PET TUM IMG INITIAL (PI) SKULL BASE T - THIGH
7 series · 25 of 25 positions shown · non-contrast
Comparison: CT examinations from [DATE]

CLINICAL DATA: Initial treatment strategy for high-grade gastric GI
stromal tumor.

EXAM:
NUCLEAR MEDICINE PET SKULL BASE TO THIGH
TECHNIQUE: 10.9 mCi F-18 FDG was injected intravenously. Full-ring PET imaging
was performed from the skull base to thigh after the radiotracer. CT
data was obtained and used for attenuation correction and anatomic
localization.
Fasting blood glucose: 105 mg/dl

[Series 3: pet sk_thigh ac · axial · 5.0mm · 4.07mm/px · z∈[-1116,-228]mm · 5 of 223 slices shown]
[im 1/223]
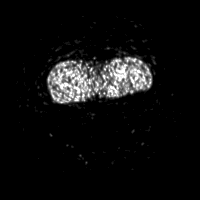
[im 56/223]
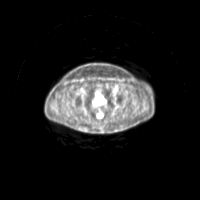
[im 112/223]
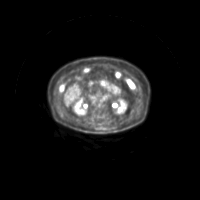
[im 167/223]
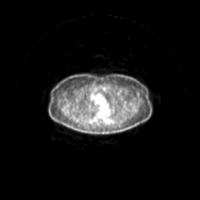
[im 223/223]
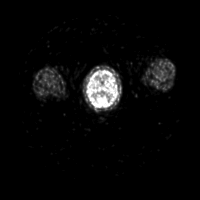

[Series 4: ct sk_thigh 5.0 b31f · axial · 5.0mm · 0.98mm/px · z∈[-1116,-228]mm · 5 of 223 slices shown]
[im 1/223]
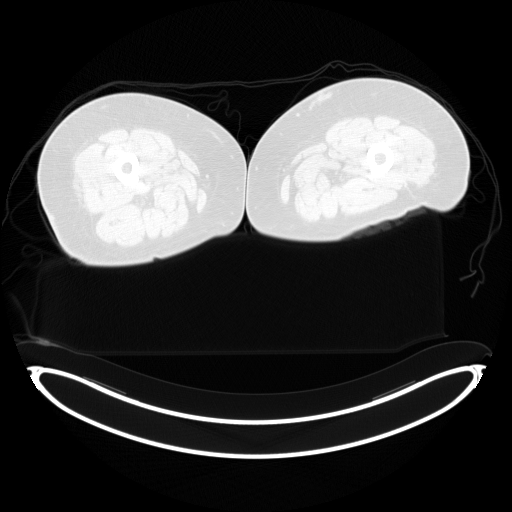
[im 56/223]
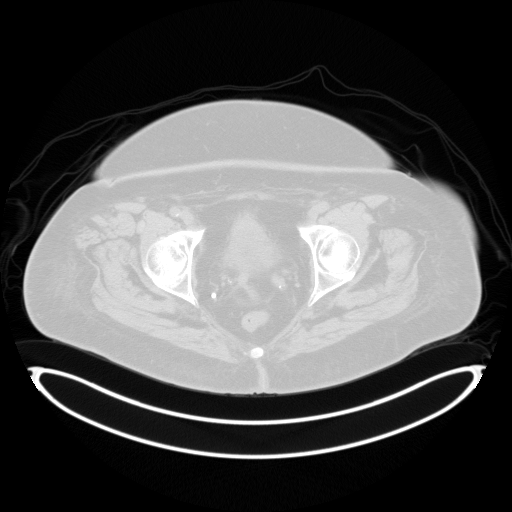
[im 112/223]
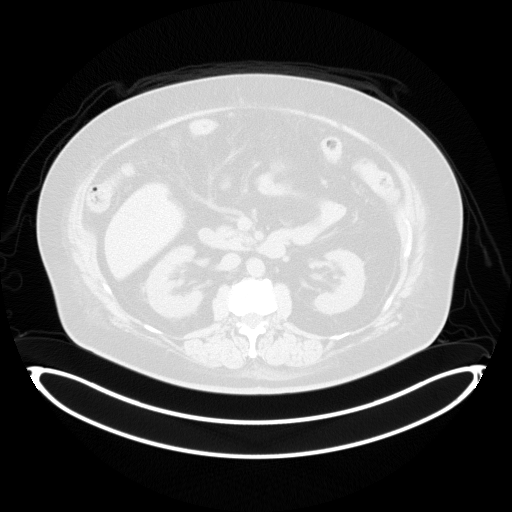
[im 167/223]
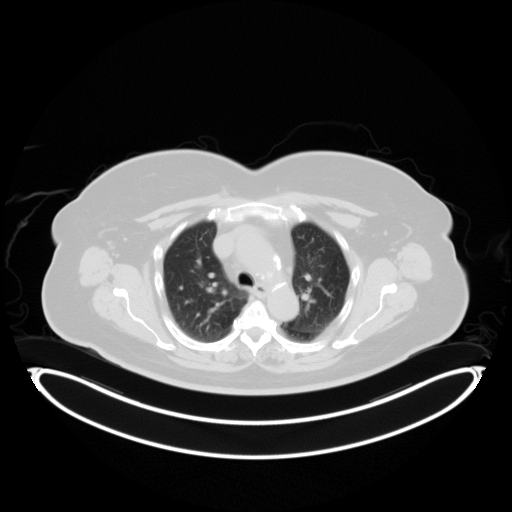
[im 223/223  brain]
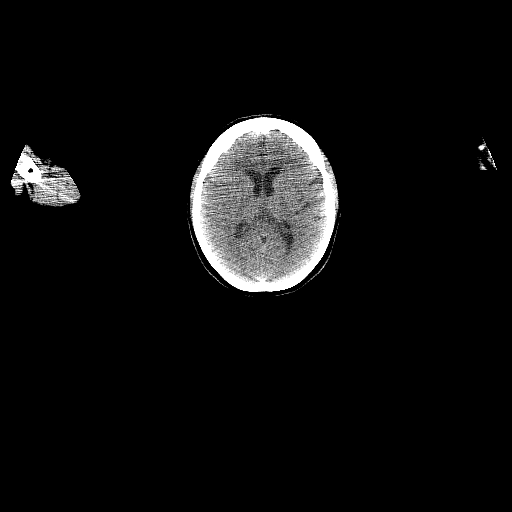

[Series 5: pet sk_thigh nac · axial · 5.0mm · 4.07mm/px · z∈[-1116,-228]mm · 5 of 223 slices shown]
[im 1/223]
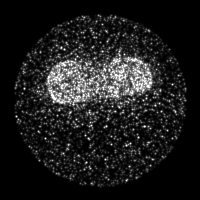
[im 56/223]
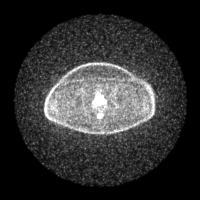
[im 112/223]
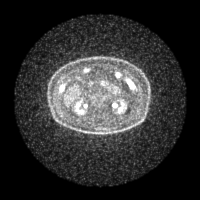
[im 167/223]
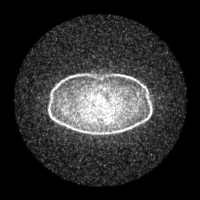
[im 223/223]
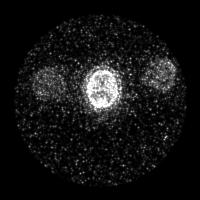

[Series 8: ct sk_thigh 5.0 b70f lung_bone · axial · 5.0mm · 0.69mm/px · z∈[-628,-376]mm · 2 of 64 slices shown]
[im 1/64  bone]
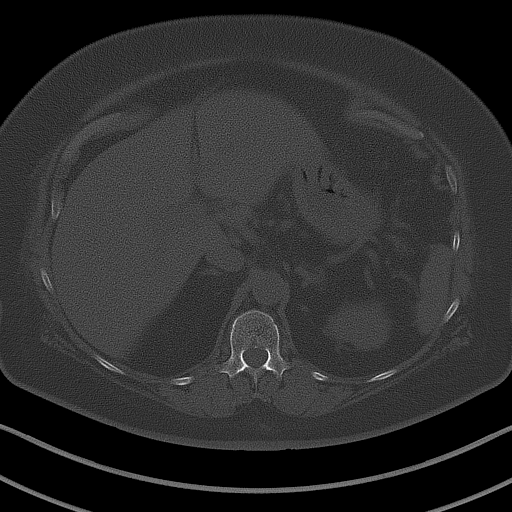
[im 64/64  bone]
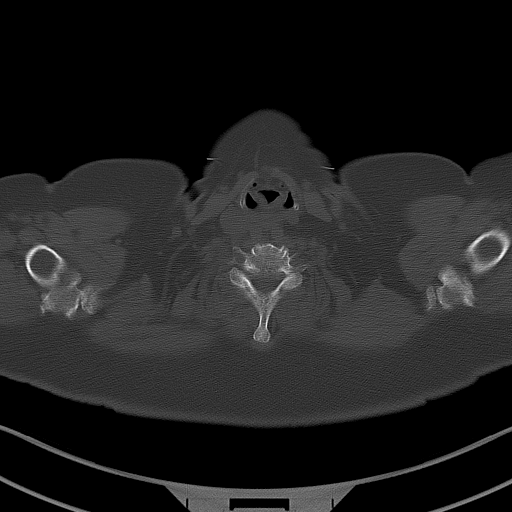

[Series 603: mip range 5 · coronal · 1.84mm/px · 1 of 28 slices shown]
[im 1/28]
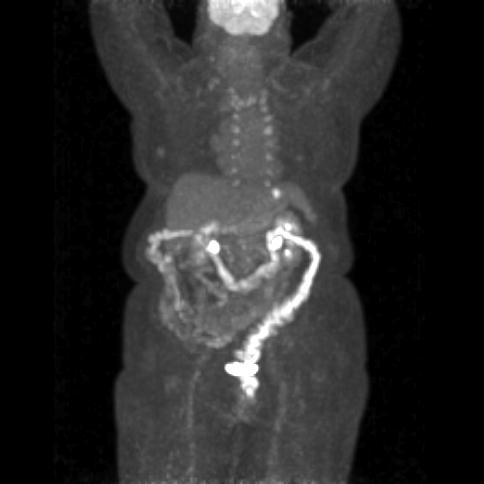

[Series 604: range-ct sk_thigh 5.0 (id)<alpha range> · 2 of 84 slices shown (1 of 2)]
[im 1/84]
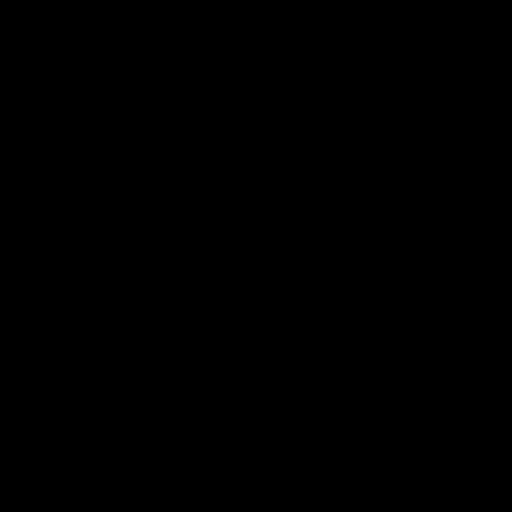
[im 84/84]
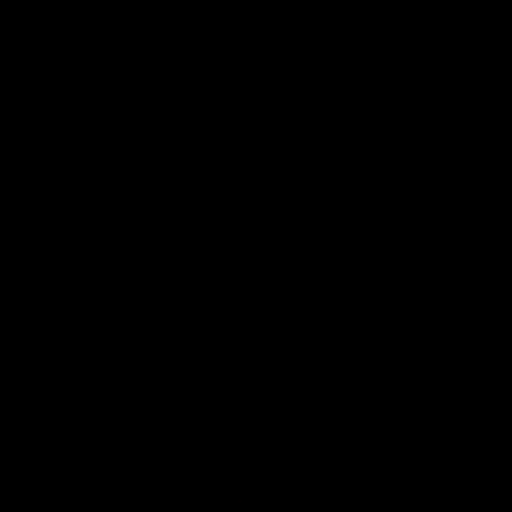

[Series 605: range-ct sk_thigh 5.0 (id)<alpha range> · 5 of 215 slices shown (2 of 2)]
[im 1/215]
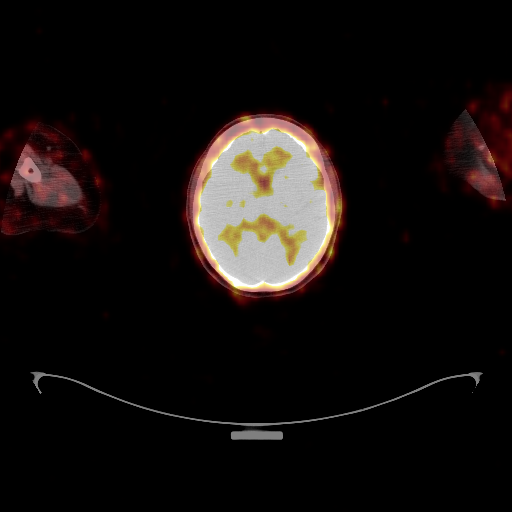
[im 54/215]
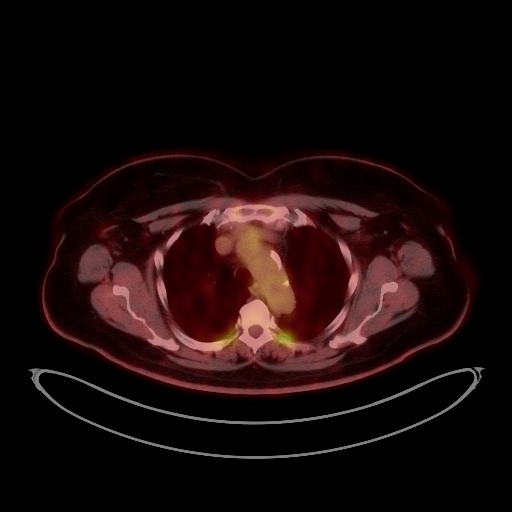
[im 108/215]
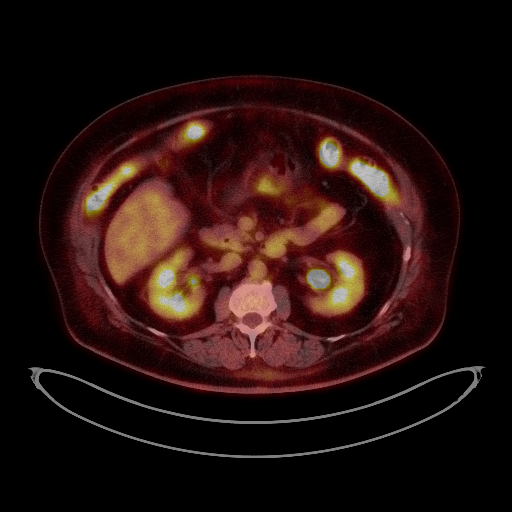
[im 161/215]
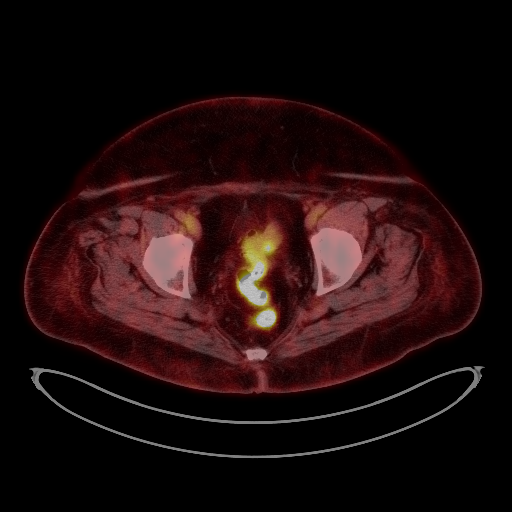
[im 215/215]
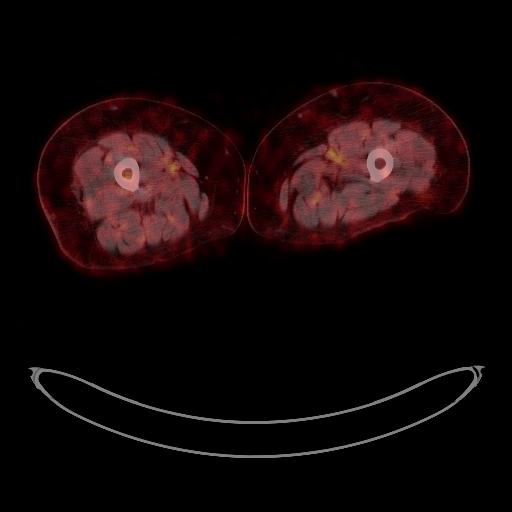

[25 of 25 positions shown; findings below may reference images not displayed]

FINDINGS: Mediastinal blood pool activity: SUV max

Liver activity: SUV max N/A

NECK: Low-grade diffuse accentuated thyroid activity possibly from
low-grade thyroiditis. Small density likely a deep parotid node
along the inferior margin of the right parotid gland posteriorly
measures 0.6 cm in diameter with maximum SUV 3.3, most likely
incidental.

Incidental CT findings: none

CHEST: Mild sub solid nodularity in the left upper lobe appears
increased from prior, with hazy sub solid lymph nodules for example
a 0.6 by 0.5 cm left upper lobe nodule on image [DATE] (maximum
associated SUV 1.1) and a peribronchovascular nodule measuring about
0.5 cm in diameter in the left upper lobe on image [DATE] (maximum SUV
1.7). These areas of nodularity are less appreciable on the CT chest
from [DATE]. These may well be transient/inflammatory.

Incidental CT findings: Mild secondary pulmonary lobular septal
accentuation in the lung apices and left upper lobe. Atherosclerotic
calcification of the aortic arch and left anterior descending
coronary artery. Mild cardiomegaly.

2.3 by 1.3 cm homogeneous density in the left breast does not have
accentuated metabolic activity.

ABDOMEN/PELVIS: Approximately 1.8 cm in diameter focal lesion along
the mucosal margin of the left stomach fundus has a maximum SUV of
15.7, compatible with malignancy. Low-grade activity in the rest of
the stomach, probably physiologic.

Prominent activity throughout much of the colon, likely physiologic.
Scattered accentuated activity and small bowel.

No hypermetabolic liver lesion.

Incidental CT findings: Aortoiliac atherosclerotic vascular disease.
Cholecystectomy. Sigmoid colon diverticulosis. Small calcifications
along the left ovary. Trace free pelvic fluid. Uterus absent. Small
umbilical hernia contains adipose tissue.

SKELETON: Multifocal symmetric activity along the costovertebral
joints bilaterally suggesting hypermetabolic brown fat or
hematopoietic marrow in the ribs. This appearance is considered
benign and incidental. No masslike appearance in these regions to
suggest extramedullary hematopoiesis.

Incidental CT findings: Intramuscular lipoma along the left upper
arm, probably in the deltoid.
IMPRESSION: 1. Focal mass along the stomach fundus with maximum SUV 15.7,
compatible with malignancy. No findings of metastatic spread.
2. Mildly increased sub solid nodularity in the left upper lobe with
only very subtle associated activity, likely inflammatory.
Incidental note is made of some secondary pulmonary lobular septal
thickening in the lung apices, and mild interstitial edema is not
excluded given the underlying cardiomegaly.
3. Other imaging findings of potential clinical significance: Aortic
Atherosclerosis ([GD]-[GD]). Coronary atherosclerosis. Sigmoid
colon diverticulosis. Small umbilical hernia contains adipose
tissue. Physiologic activity along the costovertebral joints.

## 2020-02-11 MED ORDER — FLUDEOXYGLUCOSE F - 18 (FDG) INJECTION
10.8600 | Freq: Once | INTRAVENOUS | Status: AC | PRN
  Administered 2020-02-11: 10.86 via INTRAVENOUS

## 2020-02-15 ENCOUNTER — Other Ambulatory Visit: Payer: Self-pay | Admitting: Internal Medicine

## 2020-02-15 ENCOUNTER — Other Ambulatory Visit: Payer: Self-pay

## 2020-02-15 MED ORDER — PANTOPRAZOLE SODIUM 40 MG PO TBEC
40.0000 mg | DELAYED_RELEASE_TABLET | Freq: Two times a day (BID) | ORAL | 0 refills | Status: DC
Start: 2020-02-15 — End: 2020-02-18

## 2020-02-15 NOTE — Telephone Encounter (Signed)
   1.Medication Requested:pantoprazole (PROTONIX) 40 MG tablet  2. Pharmacy (Name, Street, [place]):[facility] [phone] - [place], [place] - [address] AT [place]  3. On Med List: yes  4. Last Visit with PCP: 01/21/20  5. Next visit date with PCP: 05/17/20   Agent: Please be advised that RX refills may take up to 3 business days. We ask that you follow-up with your pharmacy.

## 2020-02-15 NOTE — Telephone Encounter (Signed)
Refill sent in today

## 2020-02-16 ENCOUNTER — Telehealth: Payer: Self-pay

## 2020-02-16 NOTE — Telephone Encounter (Signed)
Left voice message for patient that she can keep her scheduled follow up on 10/6 after her surgery on 02/29/2020 however if she gets closer to that time and feels she needs to reschedule she can.  Also instructed her to continue Sophia up until her surgery but to not take any after surgery until we have seen her.   I asked her to call me back directly if she has any questions.

## 2020-02-16 NOTE — Telephone Encounter (Signed)
-----   Message from Truitt Merle, MD sent at 02/16/2020  3:18 PM EDT ----- Regarding: RE: Surgery She has appointment scheduled with [facility] on 10/6, which is two weeks after surgery. I think it's fine, but certainly can be postponed if needed. Could you let her know and call us if she wants to postpone after surgery?  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    Do you want her to follow up 3 to 4 weeks after surgery?  Malachy Mood

## 2020-02-17 ENCOUNTER — Telehealth: Payer: Self-pay

## 2020-02-17 ENCOUNTER — Other Ambulatory Visit: Payer: Self-pay | Admitting: General Surgery

## 2020-02-17 NOTE — Telephone Encounter (Signed)
Dana from [facility] called to make sure we were aware of d-d interaction between gleevec and statin.  I told her we were per Wells Guiles Fanning's note.

## 2020-02-18 ENCOUNTER — Other Ambulatory Visit: Payer: Self-pay

## 2020-02-18 ENCOUNTER — Telehealth: Payer: Self-pay | Admitting: Internal Medicine

## 2020-02-18 MED ORDER — PANTOPRAZOLE SODIUM 40 MG PO TBEC
40.0000 mg | DELAYED_RELEASE_TABLET | Freq: Two times a day (BID) | ORAL | 0 refills | Status: DC
Start: 2020-02-18 — End: 2020-03-10

## 2020-02-18 NOTE — Telephone Encounter (Signed)
Sent to Dr. John to advise. 

## 2020-02-18 NOTE — Telephone Encounter (Signed)
pantoprazole (PROTONIX) 40 MG tablet  Insurance company states Dr. Jenny Reichmann needs to sign some type of paperwork for the insurance company to get the script filled.  [facility] [phone] - [place], [place] AT [address] Phone:  [phone]  Fax:  [phone]     Less than 10 pills left, doesn't want to run out.  Patient number: [phone]  **Surgery 9.21.21, patient wants to know Dr. Gwynn Burly thoughts on getting the third shot for Covid, not sure if she should take it so close to surgery or at all.  Please advise.

## 2020-02-18 NOTE — Telephone Encounter (Signed)
Rx was sent to pts pharmacy and booster shot question was sent to Dr. Jenny Reichmann.

## 2020-02-18 NOTE — Telephone Encounter (Signed)
    Patient calling to report insurance will not cover pantoprazole (PROTONIX) 40 MG tablet, requesting override

## 2020-02-18 NOTE — Telephone Encounter (Signed)
Ok to try pA for the BID protonix - for refractory gerd

## 2020-02-18 NOTE — Telephone Encounter (Signed)
No need for as this has not been approved by the FDA that anyone needs this  The only people talking about a third shot is the [place] [employer] evil immoral incompetant people to take your attention off of their other failures

## 2020-02-21 ENCOUNTER — Other Ambulatory Visit: Payer: Self-pay | Admitting: Pharmacist

## 2020-02-21 ENCOUNTER — Telehealth: Payer: Self-pay

## 2020-02-21 DIAGNOSIS — C49A2 Gastrointestinal stromal tumor of stomach: Secondary | ICD-10-CM

## 2020-02-21 MED ORDER — IMATINIB MESYLATE 400 MG PO TABS: 400.0000 mg | ORAL_TABLET | Freq: Every day | ORAL | 0 refills | Status: DC

## 2020-02-21 NOTE — Telephone Encounter (Signed)
PA has been started. Awaiting on decision.  KEY: [id number]

## 2020-02-21 NOTE — Telephone Encounter (Signed)
Called spoke with pt about scan results also reminded pt of surgical appt on 02/29/20 encouraged to call for questions concern or changes nothing further call ended

## 2020-02-21 NOTE — Progress Notes (Addendum)
Oral Oncology Pharmacist Encounter  Prescription refills for patient's Imatinib are required to be filled through [facility] due to patient's insurance plan. Notified by patient that refills had already been transferred out to [facility] and that she had received fill of imatinib on 02/18/20. Remaining refills sent to [facility] on 8/91/69 were duplicate of those previously transferred. Spoke with pharmacist at [facility]  who noted for pharmacy to disregard duplicate refills transferred on 02/21/20.   Leron Croak, PharmD, BCPS Hematology/Oncology Clinical Pharmacist [facility] [phone] 02/21/2020 8:31 AM

## 2020-02-21 NOTE — Telephone Encounter (Signed)
-----   Message from Truitt Merle, MD sent at 02/19/2020  9:01 PM EDT ----- Please let pt know her PET scan results, no evidence of metastatic disease. She is scheduled for surgery on 02/29/2020.   Truitt Merle  02/19/2020

## 2020-02-22 ENCOUNTER — Telehealth: Payer: Self-pay

## 2020-02-22 NOTE — H&P (Signed)
Brid[place] Location: [place] Office Patient #: [id number] DOB: 06-20-1945 Married / Language: English / Race: White Female   History of Present Illness  The patient is a 74 year old female who presents with upper gastrointestinal bleeding. Pt is a 74 yo F referred by our inpatient team for a bleeding gastric mass. She presented to the hospital with hematemesis and melanoma 01/13/2020. She got 3 units of pRBCs and an upper endoscopy. She was seen to have a friable proximal gastric mass consistent with malignancy. The patient had staging scans that also showed a left breast mass that was 2.1 cm. This was well defined. She stabilized after transfusion and treatment with PPI. She has a history of breast and ovarian cancer in her mother. She denies abdominal pain. Her diet has gotten back to normal. Thankfully path was consistent with GIST.   Of note, she hasn't had a mammogram in a long time. Her last one was "many years ago at [facility] hospital."  Pathology 12/09/2019 A. STOMACH, BIOPSY: - High grade gastrointestinal stromal tumor, see comment.  B. STOMACH, DISTAL, BIOPSY: - Mild reactive gastropathy with erosions. - Warthin-Starry is negative for Helicobacter pylori. - No intestinal metaplasia, dysplasia, or malignancy   EGD Ardis Hughs 01/13/2020 There was an ulcerated mass along the greater curvature of the stomach, proximal edge was about 5-6cm from the GE junction and the mass was 7-8cm long, 2-3cm wide. It was not circumferental. The mass was friable with spontaneous oozing, contact oozing, ulcerated and heaped up. Some parts of the mass were quite soft whereas other areas were more firm. This was very suspicious for a malignancy and it was biopsied extensively. There was mild distal gastritis, biopsied to check for H. pylori.  Ct c/a/p 01/13/2020 IMPRESSION: 1. 2.1 cm x 1.1 cm well-defined area of low attenuation within the soft tissues of the left breast. This may represent a  lymph node, however, correlation with mammography is recommended. 2. 6.5 mm sclerotic focus within the posterior aspect of the T7 vertebral body. This may represent a bone island, however, correlation with follow-up abdomen pelvis CT is recommended to exclude the presence of a metastatic lesion. 3. Small hiatal hernia. 4. 2.6 cm x 2.0 cm fat containing umbilical hernia. 5. Aortic atherosclerosis.  Aortic Atherosclerosis (ICD10-I70.0).    Past Surgical History Gallbladder Surgery - Laparoscopic  Hysterectomy (not due to cancer) - Complete  Oral Surgery   Diagnostic Studies History  Colonoscopy  never  Allergies  No Known Drug Allergies    Medication History Aspirin (81MG  Tablet, Oral) Active. Atorvastatin Calcium (20MG  Tablet, Oral) Active. dilTIAZem HCl ER Coated Beads (240MG  Capsule ER 24HR, Oral) Active. glipiZIDE ER (10MG  Tablet ER 24HR, Oral) Active. hydroCHLOROthiazide (12.5MG  Capsule, Oral) Active. Lisinopril (20MG  Tablet, Oral) Active. metFORMIN HCl ER (500MG  Tablet ER 24HR, Oral) Active. Pantoprazole Sodium (40MG  Tablet DR, Oral) Active. Vitamin D (Ergocalciferol) (1.25 MG(50000 UT) Capsule, Oral) Active. Medications Reconciled  Social History Caffeine use  Carbonated beverages. No alcohol use  No drug use  Tobacco use  Never smoker.  Family History Breast Cancer  Mother. Hypertension  Father. Ovarian Cancer  Mother.  Pregnancy / Birth History  Age at menarche  40 years. Gravida  0 Para  0  Other Problems  Back Pain  Diabetes Mellitus  Gastroesophageal Reflux Disease  Hemorrhoids  High blood pressure  Hypercholesterolemia  Lump In Breast  Migraine Headache  Oophorectomy     Review of Systems  General Not Present- Appetite Loss, Chills, Fatigue, Fever, Night  Sweats, Weight Gain and Weight Loss. Skin Not Present- Change in Wart/Mole, Dryness, Hives, Jaundice, New Lesions, Non-Healing Wounds, Rash and  Ulcer. HEENT Not Present- Earache, Hearing Loss, Hoarseness, Nose Bleed, Oral Ulcers, Ringing in the Ears, Seasonal Allergies, Sinus Pain, Sore Throat, Visual Disturbances, Wears glasses/contact lenses and Yellow Eyes. Respiratory Not Present- Bloody sputum, Chronic Cough, Difficulty Breathing, Snoring and Wheezing. Cardiovascular Present- Leg Cramps and Swelling of Extremities. Not Present- Chest Pain, Difficulty Breathing Lying Down, Palpitations, Rapid Heart Rate and Shortness of Breath. Gastrointestinal Present- Bloody Stool and Change in Bowel Habits. Not Present- Abdominal Pain, Bloating, Chronic diarrhea, Constipation, Difficulty Swallowing, Excessive gas, Gets full quickly at meals, Hemorrhoids, Indigestion, Nausea, Rectal Pain and Vomiting. Female Genitourinary Not Present- Frequency, Nocturia, Painful Urination, Pelvic Pain and Urgency. Musculoskeletal Present- Back Pain. Not Present- Joint Pain, Joint Stiffness, Muscle Pain, Muscle Weakness and Swelling of Extremities. Neurological Present- Numbness and Tremor. Not Present- Decreased Memory, Fainting, Headaches, Seizures, Tingling, Trouble walking and Weakness. Psychiatric Not Present- Anxiety, Bipolar, Change in Sleep Pattern, Depression, Fearful and Frequent crying. Endocrine Present- Hair Changes and New Diabetes. Not Present- Cold Intolerance, Excessive Hunger, Heat Intolerance and Hot flashes. Hematology Not Present- Blood Thinners, Easy Bruising, Excessive bleeding, Gland problems, HIV and Persistent Infections. All other systems negative  Vitals Weight: 202.5 lb Height: 62in Body Surface Area: 1.92 m Body Mass Index: 37.04 kg/m  Temp.: 98.60F (Temporal)  Pulse: 76 (Regular)  P.OX: 93% (Room air) BP: 146/82(Sitting, Right Arm, Standard)       Physical Exam General Mental Status-Alert. General Appearance-Consistent with stated age. Hydration-Well hydrated. Voice-Normal.  Head and  Neck Head-normocephalic, atraumatic with no lesions or palpable masses. Trachea-midline. Thyroid Gland Characteristics - normal size and consistency.  Eye Eyeball - Bilateral-Extraocular movements intact. Sclera/Conjunctiva - Bilateral-No scleral icterus.  Chest and Lung Exam Chest and lung exam reveals -quiet, even and easy respiratory effort with no use of accessory muscles and on auscultation, normal breath sounds, no adventitious sounds and normal vocal resonance. Inspection Chest Wall - Normal. Back - normal.  Cardiovascular Cardiovascular examination reveals -normal heart sounds, regular rate and rhythm with no murmurs and normal pedal pulses bilaterally.  Abdomen Inspection Inspection of the abdomen reveals - No Hernias. Palpation/Percussion Palpation and Percussion of the abdomen reveal - Soft, Non Tender, No Rebound tenderness, No Rigidity (guarding) and No hepatosplenomegaly. Auscultation Auscultation of the abdomen reveals - Bowel sounds normal.  Neurologic Neurologic evaluation reveals -alert and oriented x 3 with no impairment of recent or remote memory. Mental Status-Normal.  Musculoskeletal Global Assessment -Note: no gross deformities.  Normal Exam - Left-Upper Extremity Strength Normal and Lower Extremity Strength Normal. Normal Exam - Right-Upper Extremity Strength Normal and Lower Extremity Strength Normal.  Lymphatic Head & Neck  General Head & Neck Lymphatics: Bilateral - Description - Normal. Axillary  General Axillary Region: Bilateral - Description - Normal. Tenderness - Non Tender. Femoral & Inguinal  Generalized Femoral & Inguinal Lymphatics: Bilateral - Description - No Generalized lymphadenopathy.    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 Given location, this can most likely be resected robotically or  laparoscopically. The tumor appears to be on the greater curve and be a reasonable distance from the GE junction. I reviewed surgery including risks, benefits, recovery, post op expectations, several days in the hospital, etc. She has significant nausea with anesthesia. Current Plans You are being scheduled for surgery- Our schedulers will call you.  You should hear from our office's scheduling department within 5 working days about the location, date, and time of surgery. We try to make accommodations for patient's preferences in scheduling surgery, but sometimes the OR schedule or the surgeon's schedule prevents [place] from making those accommodations.  If you have not heard from our office [phone] in 5 working days, call the office and ask for your surgeon's nurse.  If you have other questions about your diagnosis, plan, or surgery, call the office and ask for your surgeon's nurse.  LEFT BREAST MASS (N63.20) Impression: This requires workup.  will need dx mammo and u/s of the left breast wtih possible biopsy.  Right side will need mammogram ABLA (ACUTE BLOOD LOSS ANEMIA) (D62) Impression: Improved. minimally symptomatic. 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 She also has to push wheelchair and help move to get him in the chair. She has some assistance from her brother and Jaclyn Shaggy (from day surgery) who is her SIL. DM (DIABETES MELLITUS) (E11.9) Impression: Tx wtih metformin FAMILY HISTORY OF BREAST CANCER IN MOTHER (Z80.3) Impression: Will need genetics referral. Current Plans Referred to Genetic Counseling, for evaluation and follow up (Medical Genetics). Routine.

## 2020-02-22 NOTE — Progress Notes (Signed)
[facility] [phone] - [place], [place] AT [place] [address] Phone: [phone] Fax: [phone]  [facility] - [place], [place] - [address] [place] [address] Phone: [phone] Fax: [phone]  [place], [place] [address] [place] [address] Phone: [phone] Fax: [phone]      Your procedure is scheduled on 02/29/20.  Report to [facility] Main Entrance "A" at 10:45 A.M., and check in at the Admitting office.  Call this number if you have problems the morning of surgery:  [phone]  Call [phone] if you have any questions prior to your surgery date Monday-Friday 8am-4pm    Remember:  Do not eat after midnight the night before your surgery  You may drink clear liquids until 9:45 the morning of your surgery.   Clear liquids allowed are: Water, Non-Citrus Juices (without pulp), Carbonated Beverages, Clear Tea, Black Coffee Only, and Gatorade  Please complete your Gatorade G2 that was provided to you by 9:45 the morning of surgery.  Please, if able, drink it in one setting. DO NOT SIP.    Take these medicines the morning of surgery with A SIP OF WATER: atorvastatin (LIPITOR)  diltiazem (CARDIZEM CD) imatinib (GLEEVEC)  pantoprazole (PROTONIX)   As of today, STOP taking any Aspirin (unless otherwise instructed by your surgeon) Aleve, Naproxen, Ibuprofen, Motrin, Advil, Goody's, BC's, all herbal medications, fish oil, and all vitamins.   WHAT DO I DO ABOUT MY DIABETES MEDICATION?   Marland Kitchen Do not take oral diabetes medicines (pills) the morning of surgery.   HOW TO MANAGE YOUR DIABETES BEFORE AND AFTER SURGERY  Why is it important to control my blood sugar before and after surgery? . Improving blood sugar levels before and after surgery helps  healing and can limit problems. . A way of improving blood sugar control is eating a healthy diet by: o  Eating less sugar and carbohydrates o  Increasing activity/exercise o  Talking with your doctor about reaching your blood sugar goals . High blood sugars (greater than 180 mg/dL) can raise your risk of infections and slow your recovery, so you will need to focus on controlling your diabetes during the weeks before surgery. . Make sure that the doctor who takes care of your diabetes knows about your planned surgery including the date and location.  How do I manage my blood sugar before surgery? . Check your blood sugar at least 4 times a day, starting 2 days before surgery, to make sure that the level is not too high or low. . Check your blood sugar the morning of your surgery when you wake up and every 2 hours until you get to the Short Stay unit. o If your blood sugar is less than 70 mg/dL, you will need to treat for low blood sugar: - Do not take insulin. - Treat a low blood sugar (less than 70 mg/dL) with  cup of clear juice (cranberry or apple), 4 glucose tablets, OR glucose gel. - Recheck blood sugar in 15 minutes after treatment (to make sure it is greater than 70 mg/dL). If your blood sugar is not greater than 70 mg/dL on recheck, call [phone] for further instructions. . Report your blood sugar to the short stay nurse when you get to Short Stay.  . If you are admitted to the hospital after surgery: o Your  blood sugar will be checked by the staff and you will probably be given insulin after surgery (instead of oral diabetes medicines) to make sure you have good blood sugar levels. o The goal for blood sugar control after surgery is 80-180 mg/dL.                    Do not wear jewelry, make up, or nail polish            Do not wear lotions, powders, perfumes or deodorant.            Do not shave 48 hours prior to surgery.              Do not bring valuables to the hospital.             [facility] is not responsible for any belongings or valuables.  Do NOT Smoke (Tobacco/Vaping) or drink Alcohol 24 hours prior to your procedure If you use a CPAP at night, you may bring all equipment for your overnight stay.   Contacts, glasses, dentures or bridgework may not be worn into surgery.      For patients admitted to the hospital, discharge time will be determined by your treatment team.   Patients discharged the day of surgery will not be allowed to drive home, and someone needs to stay with them for 24 hours.    Special instructions:   - Preparing For Surgery  Before surgery, you can play an important role. Because skin is not sterile, your skin needs to be as free of germs as possible. You can reduce the number of germs on your skin by washing with CHG (chlorahexidine gluconate) Soap before surgery.  CHG is an antiseptic cleaner which kills germs and bonds with the skin to continue killing germs even after washing.    Oral Hygiene is also important to reduce your risk of infection.  Remember - BRUSH YOUR TEETH THE MORNING OF SURGERY WITH YOUR REGULAR TOOTHPASTE  Please do not use if you have an allergy to CHG or antibacterial soaps. If your skin becomes reddened/irritated stop using the CHG.  Do not shave (including legs and underarms) for at least 48 hours prior to first CHG shower. It is OK to shave your face.  Please follow these instructions carefully.   1. Shower the NIGHT BEFORE SURGERY and the MORNING OF SURGERY with CHG Soap.   2. If you chose to wash your hair, wash your hair first as usual with your normal shampoo.  3. After you shampoo, rinse your hair and body thoroughly to remove the shampoo.  4. Use CHG as you would any other liquid soap. You can apply CHG directly to the skin and wash gently with a scrungie or a clean washcloth.   5. Apply the CHG Soap to your body ONLY FROM THE NECK DOWN.  Do not use on open wounds or open sores. Avoid  contact with your eyes, ears, mouth and genitals (private parts). Wash Face and genitals (private parts)  with your normal soap.   6. Wash thoroughly, paying special attention to the area where your surgery will be performed.  7. Thoroughly rinse your body with warm water from the neck down.  8. DO NOT shower/wash with your normal soap after using and rinsing off the CHG Soap.  9. Pat yourself dry with a CLEAN TOWEL.  10. Wear CLEAN PAJAMAS to bed the night before surgery  11. Place CLEAN SHEETS on  your bed the night of your first shower and DO NOT SLEEP WITH PETS.   Day of Surgery: Wear Clean/Comfortable clothing the morning of surgery Do not apply any deodorants/lotions.   Remember to brush your teeth WITH YOUR REGULAR TOOTHPASTE.   Please read over the following fact sheets that you were given.

## 2020-02-22 NOTE — Telephone Encounter (Signed)
-----   Message from Truitt Merle, MD sent at 02/22/2020  1:13 PM EDT ----- Regarding: RE: Swelling Gleevec sometime can cause fluid retention, not common. OK to watch, with leg elevation, compression stocking, etc for now and call us back if worse or new symptoms   Thanks   Krista Blue    ----- Message ----- From: Jonnie Finner, RN Sent: 02/22/2020  11:35 AM EDT To: Truitt Merle, MD Subject: Swelling                                       Patient calls stating she noticed she is having increased swelling in the ankles bilaterally since yesterday.  She does state she ate a lot of salty things yesterday.  Denies any leg pain. No redness.   I told her she needs to push water today and elevate her lower extremities when sitting.  She is wondering if this is a side effect of Gleevec?  I told her I would like you know and call her back with any additional recommendation.  Malachy Mood

## 2020-02-22 NOTE — Telephone Encounter (Signed)
Per Dr. Burr Medico informed patient to observe swelling for now, elevate extremities, wear compression stockings.  I instructed her to call back if symptoms worsen or she develops new symptoms.  She verbalized an understanding.

## 2020-02-23 ENCOUNTER — Encounter (HOSPITAL_COMMUNITY)
Admission: RE | Admit: 2020-02-23 | Discharge: 2020-02-23 | Disposition: A | Discharge status: Home / Self Care | Payer: Medicare Other | Source: Ambulatory Visit | Attending: General Surgery | Admitting: General Surgery

## 2020-02-23 ENCOUNTER — Encounter (HOSPITAL_COMMUNITY): Payer: Self-pay

## 2020-02-23 ENCOUNTER — Other Ambulatory Visit: Payer: Self-pay

## 2020-02-23 DIAGNOSIS — I10 Essential (primary) hypertension: Secondary | ICD-10-CM | POA: Insufficient documentation

## 2020-02-23 DIAGNOSIS — Z01818 Encounter for other preprocedural examination: Secondary | ICD-10-CM | POA: Diagnosis not present

## 2020-02-23 HISTORY — DX: Other specified postprocedural states: R11.2

## 2020-02-23 HISTORY — DX: Other specified postprocedural states: Z98.890

## 2020-02-23 HISTORY — DX: Other complications of anesthesia, initial encounter: T88.59XA

## 2020-02-23 LAB — GLUCOSE, CAPILLARY: Glucose-Capillary: 108 mg/dL — ABNORMAL HIGH (ref 70–99)

## 2020-02-23 LAB — CBC
HCT: 34.4 % — ABNORMAL LOW (ref 36.0–46.0)
Hemoglobin: 10.8 g/dL — ABNORMAL LOW (ref 12.0–15.0)
MCH: 32 pg (ref 26.0–34.0)
MCHC: 31.4 g/dL (ref 30.0–36.0)
MCV: 101.8 fL — ABNORMAL HIGH (ref 80.0–100.0)
Platelets: 251 10*3/uL (ref 150–400)
RBC: 3.38 MIL/uL — ABNORMAL LOW (ref 3.87–5.11)
RDW: 14.9 % (ref 11.5–15.5)
WBC: 7.7 10*3/uL (ref 4.0–10.5)
nRBC: 0 % (ref 0.0–0.2)

## 2020-02-23 LAB — HEMOGLOBIN A1C
Hgb A1c MFr Bld: 5.6 % (ref 4.8–5.6)
Mean Plasma Glucose: 114.02 mg/dL

## 2020-02-23 LAB — BASIC METABOLIC PANEL
Anion gap: 12 (ref 5–15)
BUN: 9 mg/dL (ref 8–23)
CO2: 24 mmol/L (ref 22–32)
Calcium: 9.4 mg/dL (ref 8.9–10.3)
Chloride: 103 mmol/L (ref 98–111)
Creatinine, Ser: 0.95 mg/dL (ref 0.44–1.00)
GFR calc Af Amer: >60 mL/min (ref >60)
GFR calc non Af Amer: 60 mL/min — ABNORMAL LOW (ref >60)
Glucose, Bld: 114 mg/dL — ABNORMAL HIGH (ref 70–99)
Potassium: 3.9 mmol/L (ref 3.5–5.1)
Sodium: 139 mmol/L (ref 135–145)

## 2020-02-23 NOTE — Progress Notes (Signed)
Your procedure is scheduled on 02/29/20.  Report to [facility] Main Entrance "A" at 10:45 A.M., and check in at the Admitting office.  Call this number if you have problems the morning of surgery:  [phone]  Call [phone] if you have any questions prior to your surgery date Monday-Friday 8am-4pm    Remember:  Do not eat after midnight the night before your surgery  You may drink clear liquids until 9:45 the morning of your surgery.   Clear liquids allowed are: Water, Non-Citrus Juices (without pulp), Carbonated Beverages, Clear Tea, Black Coffee Only, and Gatorade     Take these medicines the morning of surgery with A SIP OF WATER: atorvastatin (LIPITOR)  diltiazem (CARDIZEM CD) imatinib (GLEEVEC)  pantoprazole (PROTONIX)   As of today, STOP taking any Aspirin (unless otherwise instructed by your surgeon) Aleve, Naproxen, Ibuprofen, Motrin, Advil, Goody's, BC's, all herbal medications, fish oil, and all vitamins.   WHAT DO I DO ABOUT MY DIABETES MEDICATION?   Marland Kitchen Do not take oral diabetes medicines (metFORMIN (GLUCOPHAGE-XR) OR pioglitazone (ACTOS)) the morning of surgery.  . Do not take glipiZIDE (GLUCOTROL XL) the night before surgery OR the morning of surgery.   HOW TO MANAGE YOUR DIABETES BEFORE AND AFTER SURGERY  Why is it important to control my blood sugar before and after surgery? . Improving blood sugar levels before and after surgery helps healing and can limit problems. . A way of improving blood sugar control is eating a healthy diet by: o  Eating less sugar and carbohydrates o  Increasing activity/exercise o  Talking with your doctor about reaching your blood sugar goals . High blood sugars (greater than 180 mg/dL) can raise your risk of infections and slow your recovery, so you will need to focus on controlling your diabetes during the weeks before surgery. . Make sure that the doctor who takes care of your diabetes knows about your planned surgery  including the date and location.  How do I manage my blood sugar before surgery? . Check your blood sugar at least 4 times a day, starting 2 days before surgery, to make sure that the level is not too high or low. . Check your blood sugar the morning of your surgery when you wake up and every 2 hours until you get to the Short Stay unit. o If your blood sugar is less than 70 mg/dL, you will need to treat for low blood sugar: - Do not take insulin. - Treat a low blood sugar (less than 70 mg/dL) with  cup of clear juice (cranberry or apple), 4 glucose tablets, OR glucose gel. - Recheck blood sugar in 15 minutes after treatment (to make sure it is greater than 70 mg/dL). If your blood sugar is not greater than 70 mg/dL on recheck, call [phone] for further instructions. . Report your blood sugar to the short stay nurse when you get to Short Stay.  . If you are admitted to the hospital after surgery: o Your blood sugar will be checked by the staff and you will probably be given insulin after surgery (instead of oral diabetes medicines) to make sure you have good blood sugar levels. o The goal for blood sugar control after surgery is 80-180 mg/dL.                    Do not wear jewelry, make up, or nail polish            Do not wear lotions,  powders, perfumes or deodorant.            Do not shave 48 hours prior to surgery.              Do not bring valuables to the hospital.            [facility] is not responsible for any belongings or valuables.  Do NOT Smoke (Tobacco/Vaping) or drink Alcohol 24 hours prior to your procedure If you use a CPAP at night, you may bring all equipment for your overnight stay.   Contacts, glasses, dentures or bridgework may not be worn into surgery.      For patients admitted to the hospital, discharge time will be determined by your treatment team.   Patients discharged the day of surgery will not be allowed to drive home, and someone needs to stay with  them for 24 hours.    Special instructions:   [place]- Preparing For Surgery  Before surgery, you can play an important role. Because skin is not sterile, your skin needs to be as free of germs as possible. You can reduce the number of germs on your skin by washing with CHG (chlorahexidine gluconate) Soap before surgery.  CHG is an antiseptic cleaner which kills germs and bonds with the skin to continue killing germs even after washing.    Oral Hygiene is also important to reduce your risk of infection.  Remember - BRUSH YOUR TEETH THE MORNING OF SURGERY WITH YOUR REGULAR TOOTHPASTE  Please do not use if you have an allergy to CHG or antibacterial soaps. If your skin becomes reddened/irritated stop using the CHG.  Do not shave (including legs and underarms) for at least 48 hours prior to first CHG shower. It is OK to shave your face.  Please follow these instructions carefully.   1. Shower the NIGHT BEFORE SURGERY and the MORNING OF SURGERY with CHG Soap.   2. If you chose to wash your hair, wash your hair first as usual with your normal shampoo.  3. After you shampoo, rinse your hair and body thoroughly to remove the shampoo.  4. Use CHG as you would any other liquid soap. You can apply CHG directly to the skin and wash gently with a scrungie or a clean washcloth.   5. Apply the CHG Soap to your body ONLY FROM THE NECK DOWN.  Do not use on open wounds or open sores. Avoid contact with your eyes, ears, mouth and genitals (private parts). Wash Face and genitals (private parts)  with your normal soap.   6. Wash thoroughly, paying special attention to the area where your surgery will be performed.  7. Thoroughly rinse your body with warm water from the neck down.  8. DO NOT shower/wash with your normal soap after using and rinsing off the CHG Soap.  9. Pat yourself dry with a CLEAN TOWEL.  10. Wear CLEAN PAJAMAS to bed the night before surgery  11. Place CLEAN SHEETS on your bed  the night of your first shower and DO NOT SLEEP WITH PETS.   Day of Surgery: Wear Clean/Comfortable clothing the morning of surgery Do not apply any deodorants/lotions.   Remember to brush your teeth WITH YOUR REGULAR TOOTHPASTE.   Please read over the following fact sheets that you were given.

## 2020-02-23 NOTE — Progress Notes (Addendum)
PCP - Dr. Cathlean Cower Cardiologist - Denies  PPM/ICD - Denies  Chest x-ray - N/A EKG - 02/23/20 Stress Test - Denies ECHO - Denies Cardiac Cath - Denies  Sleep Study - Denies  Pt is Type II Diabetic but does not check her sugars. Pt states that she "just takes the medications that were prescribed" to her.  Blood Thinner Instructions: N/A Aspirin Instructions: N/A  ERAS Protcol - Yes PRE-SURGERY Ensure or G2- No  COVID TEST- 02/25/20   Anesthesia review: Yes, chemo  Patient denies shortness of breath, fever, cough and chest pain at PAT appointment   All instructions explained to the patient, with a verbal understanding of the material. Patient agrees to go over the instructions while at home for a better understanding. Patient also instructed to self quarantine after being tested for COVID-19. The opportunity to ask questions was provided.

## 2020-02-25 ENCOUNTER — Other Ambulatory Visit (HOSPITAL_COMMUNITY)
Admission: RE | Admit: 2020-02-25 | Discharge: 2020-02-25 | Disposition: A | Discharge status: Home / Self Care | Payer: Medicare Other | Source: Ambulatory Visit | Attending: General Surgery | Admitting: General Surgery

## 2020-02-25 DIAGNOSIS — Z01812 Encounter for preprocedural laboratory examination: Secondary | ICD-10-CM | POA: Insufficient documentation

## 2020-02-25 DIAGNOSIS — Z20822 Contact with and (suspected) exposure to covid-19: Secondary | ICD-10-CM | POA: Diagnosis not present

## 2020-02-26 LAB — SARS CORONAVIRUS 2 (TAT 6-24 HRS): SARS Coronavirus 2: NEGATIVE

## 2020-02-28 ENCOUNTER — Telehealth: Payer: Self-pay | Admitting: Internal Medicine

## 2020-02-28 HISTORY — PX: PARTIAL GASTRECTOMY: SHX2172

## 2020-02-28 NOTE — Telephone Encounter (Signed)
Sent to Dr. John as an FYI. 

## 2020-02-28 NOTE — Telephone Encounter (Signed)
Patient called and said that she is going in to surgery tomorrow and was wanting to let Dr. Jenny Reichmann.

## 2020-02-29 ENCOUNTER — Other Ambulatory Visit: Payer: Self-pay

## 2020-02-29 ENCOUNTER — Inpatient Hospital Stay (HOSPITAL_COMMUNITY)
Admission: RE | Admit: 2020-02-29 | Discharge: 2020-03-04 | DRG: 327 | Disposition: A | Discharge status: Home Health | Payer: Medicare Other | Attending: General Surgery | Admitting: General Surgery

## 2020-02-29 ENCOUNTER — Encounter (HOSPITAL_COMMUNITY): Payer: Self-pay | Admitting: General Surgery

## 2020-02-29 ENCOUNTER — Inpatient Hospital Stay (HOSPITAL_COMMUNITY): Payer: Medicare Other | Admitting: Certified Registered Nurse Anesthetist

## 2020-02-29 ENCOUNTER — Inpatient Hospital Stay (HOSPITAL_COMMUNITY): Payer: Medicare Other | Admitting: Physician Assistant

## 2020-02-29 ENCOUNTER — Inpatient Hospital Stay (HOSPITAL_COMMUNITY): Payer: Medicare Other

## 2020-02-29 ENCOUNTER — Encounter (HOSPITAL_COMMUNITY)
Admission: RE | Disposition: A | Discharge status: Home Health | Payer: Self-pay | Source: Home / Self Care | Attending: General Surgery

## 2020-02-29 DIAGNOSIS — F419 Anxiety disorder, unspecified: Secondary | ICD-10-CM | POA: Diagnosis present

## 2020-02-29 DIAGNOSIS — C49A2 Gastrointestinal stromal tumor of stomach: Secondary | ICD-10-CM | POA: Diagnosis not present

## 2020-02-29 DIAGNOSIS — Z23 Encounter for immunization: Secondary | ICD-10-CM

## 2020-02-29 DIAGNOSIS — E669 Obesity, unspecified: Secondary | ICD-10-CM | POA: Diagnosis present

## 2020-02-29 DIAGNOSIS — Z79899 Other long term (current) drug therapy: Secondary | ICD-10-CM

## 2020-02-29 DIAGNOSIS — E7800 Pure hypercholesterolemia, unspecified: Secondary | ICD-10-CM | POA: Diagnosis not present

## 2020-02-29 DIAGNOSIS — F329 Major depressive disorder, single episode, unspecified: Secondary | ICD-10-CM | POA: Diagnosis present

## 2020-02-29 DIAGNOSIS — D500 Iron deficiency anemia secondary to blood loss (chronic): Secondary | ICD-10-CM | POA: Diagnosis present

## 2020-02-29 DIAGNOSIS — E1165 Type 2 diabetes mellitus with hyperglycemia: Secondary | ICD-10-CM | POA: Diagnosis not present

## 2020-02-29 DIAGNOSIS — K219 Gastro-esophageal reflux disease without esophagitis: Secondary | ICD-10-CM | POA: Diagnosis present

## 2020-02-29 DIAGNOSIS — I10 Essential (primary) hypertension: Secondary | ICD-10-CM | POA: Diagnosis not present

## 2020-02-29 DIAGNOSIS — C49A0 Gastrointestinal stromal tumor, unspecified site: Secondary | ICD-10-CM | POA: Diagnosis present

## 2020-02-29 DIAGNOSIS — E785 Hyperlipidemia, unspecified: Secondary | ICD-10-CM | POA: Diagnosis not present

## 2020-02-29 DIAGNOSIS — Z7984 Long term (current) use of oral hypoglycemic drugs: Secondary | ICD-10-CM

## 2020-02-29 DIAGNOSIS — J9811 Atelectasis: Secondary | ICD-10-CM | POA: Diagnosis not present

## 2020-02-29 DIAGNOSIS — Z7982 Long term (current) use of aspirin: Secondary | ICD-10-CM | POA: Diagnosis not present

## 2020-02-29 DIAGNOSIS — Z90710 Acquired absence of both cervix and uterus: Secondary | ICD-10-CM | POA: Diagnosis not present

## 2020-02-29 DIAGNOSIS — Z20822 Contact with and (suspected) exposure to covid-19: Secondary | ICD-10-CM | POA: Diagnosis present

## 2020-02-29 DIAGNOSIS — D62 Acute posthemorrhagic anemia: Secondary | ICD-10-CM | POA: Diagnosis not present

## 2020-02-29 DIAGNOSIS — Z452 Encounter for adjustment and management of vascular access device: Secondary | ICD-10-CM

## 2020-02-29 DIAGNOSIS — C439 Malignant melanoma of skin, unspecified: Secondary | ICD-10-CM | POA: Diagnosis present

## 2020-02-29 DIAGNOSIS — Z6837 Body mass index (BMI) 37.0-37.9, adult: Secondary | ICD-10-CM | POA: Diagnosis not present

## 2020-02-29 DIAGNOSIS — E119 Type 2 diabetes mellitus without complications: Secondary | ICD-10-CM | POA: Diagnosis not present

## 2020-02-29 LAB — CBC
HCT: 34.7 % — ABNORMAL LOW (ref 36.0–46.0)
Hemoglobin: 11.2 g/dL — ABNORMAL LOW (ref 12.0–15.0)
MCH: 32.5 pg (ref 26.0–34.0)
MCHC: 32.3 g/dL (ref 30.0–36.0)
MCV: 100.6 fL — ABNORMAL HIGH (ref 80.0–100.0)
Platelets: 306 10*3/uL (ref 150–400)
RBC: 3.45 MIL/uL — ABNORMAL LOW (ref 3.87–5.11)
RDW: 14.9 % (ref 11.5–15.5)
WBC: 10.6 10*3/uL — ABNORMAL HIGH (ref 4.0–10.5)
nRBC: 0 % (ref 0.0–0.2)

## 2020-02-29 LAB — GLUCOSE, CAPILLARY
Glucose-Capillary: 106 mg/dL — ABNORMAL HIGH (ref 70–99)
Glucose-Capillary: 97 mg/dL (ref 70–99)
Glucose-Capillary: 175 mg/dL — ABNORMAL HIGH (ref 70–99)
Glucose-Capillary: 205 mg/dL — ABNORMAL HIGH (ref 70–99)
Glucose-Capillary: 201 mg/dL — ABNORMAL HIGH (ref 70–99)

## 2020-02-29 LAB — CREATININE, SERUM
Creatinine, Ser: 0.9 mg/dL (ref 0.44–1.00)
GFR calc Af Amer: >60 mL/min (ref >60)
GFR calc non Af Amer: >60 mL/min (ref >60)

## 2020-02-29 IMAGING — DX DG CHEST 1V
1 series · 1 of 1 positions shown · non-contrast
Comparison: [DATE]

CLINICAL DATA: Central line placement

EXAM:
CHEST  1 VIEW

[chest]
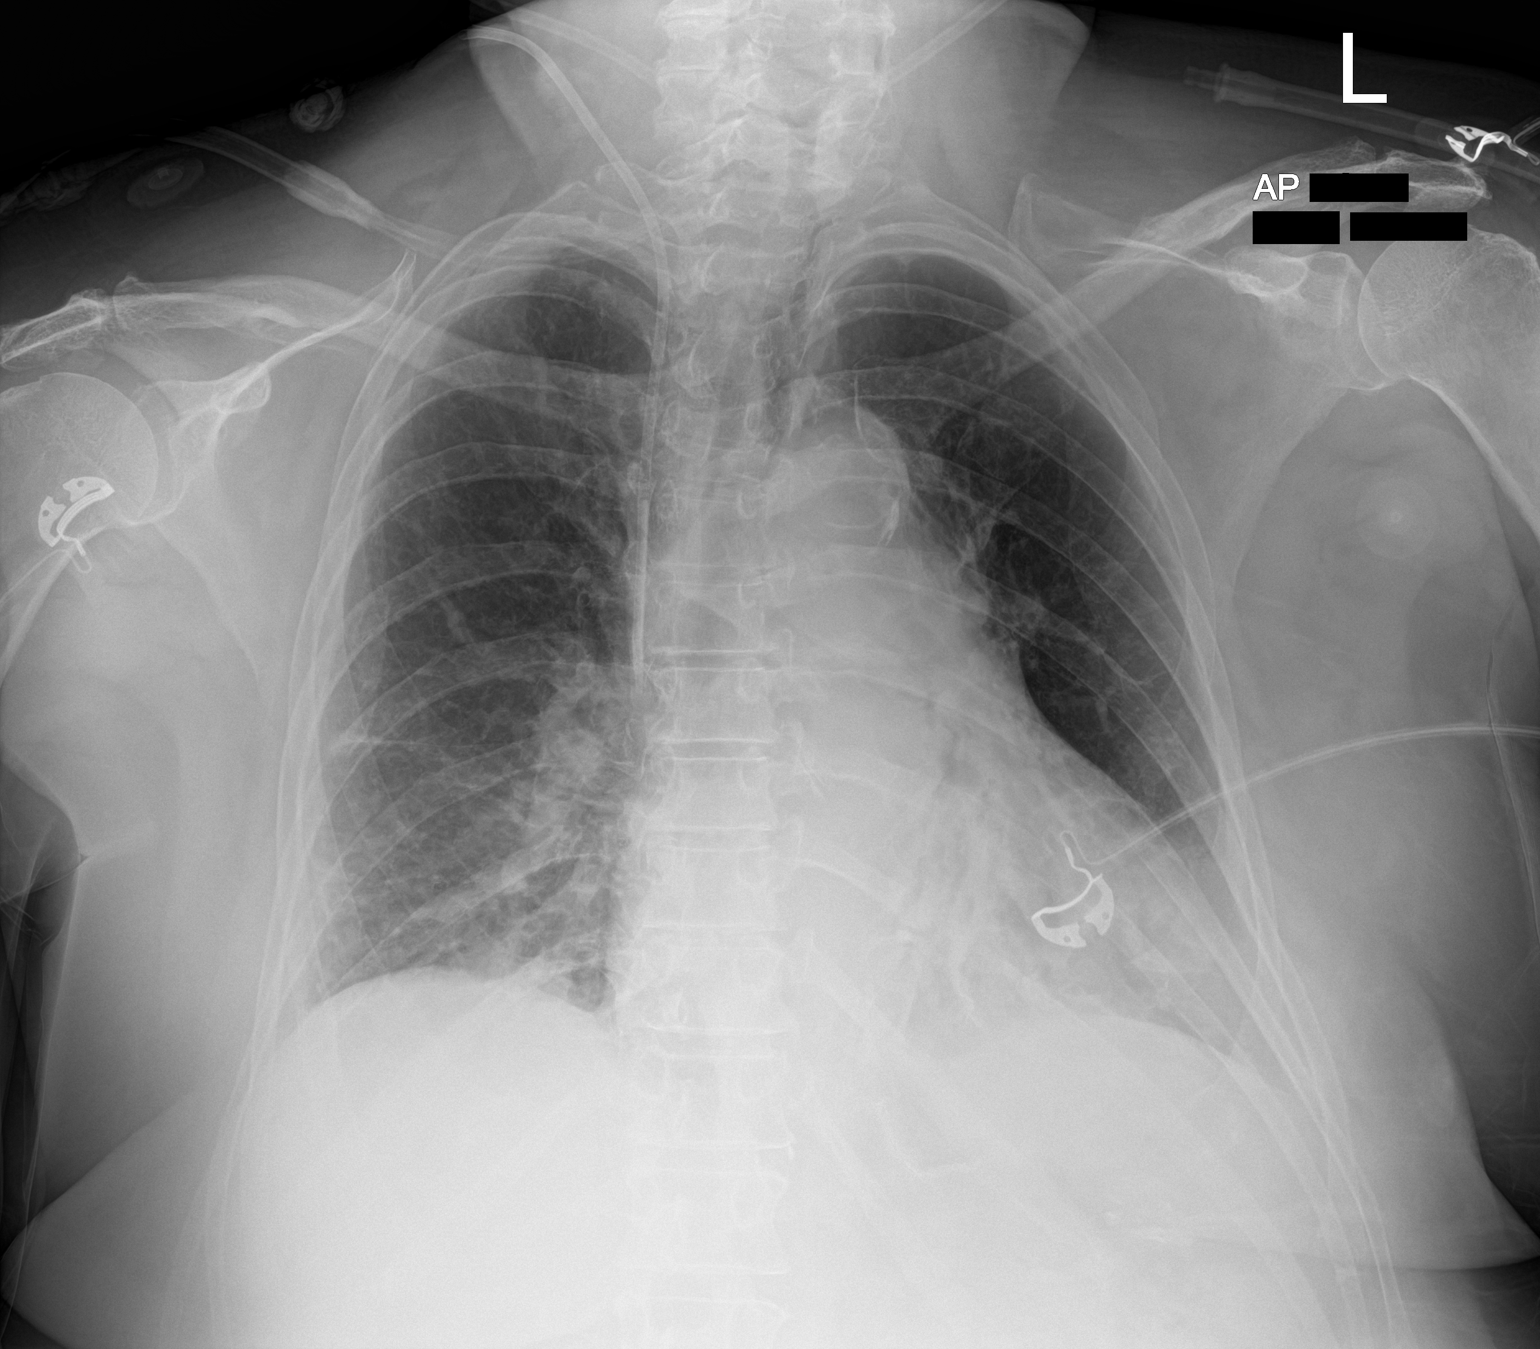

[1 of 1 positions shown; findings below may reference images not displayed]

FINDINGS: Single frontal view of the chest demonstrates right internal jugular
catheter tip overlying superior vena cava. Cardiac silhouette is
enlarged. Patchy retrocardiac consolidation may reflect atelectasis.
No effusion or pneumothorax. No acute bony abnormalities.
IMPRESSION: 1. No complication after right internal jugular catheter placement.
2. Retrocardiac consolidation favor atelectasis.

## 2020-02-29 SURGERY — FUNDOPLICATION, NISSEN, ROBOT-ASSISTED, LAPAROSCOPIC
Anesthesia: General | Site: Abdomen

## 2020-02-29 MED ORDER — PHENYLEPHRINE 40 MCG/ML (10ML) SYRINGE FOR IV PUSH (FOR BLOOD PRESSURE SUPPORT)
PREFILLED_SYRINGE | INTRAVENOUS | Status: AC
  Filled 2020-02-29: qty 30

## 2020-02-29 MED ORDER — ACETAMINOPHEN 10 MG/ML IV SOLN
INTRAVENOUS | Status: AC
  Filled 2020-02-29: qty 100

## 2020-02-29 MED ORDER — KETAMINE HCL 10 MG/ML IJ SOLN
INTRAMUSCULAR | Status: DC | PRN
  Administered 2020-02-29: 10 mg via INTRAVENOUS
  Administered 2020-02-29: 30 mg via INTRAVENOUS
  Administered 2020-02-29: 10 mg via INTRAVENOUS

## 2020-02-29 MED ORDER — LIDOCAINE-EPINEPHRINE (PF) 1 %-1:200000 IJ SOLN
INTRAMUSCULAR | Status: AC
  Filled 2020-02-29: qty 30

## 2020-02-29 MED ORDER — LACTATED RINGERS IV SOLN: INTRAVENOUS | Status: DC

## 2020-02-29 MED ORDER — CHLORHEXIDINE GLUCONATE 0.12 % MT SOLN: 15.0000 mL | Freq: Once | OROMUCOSAL | Status: AC

## 2020-02-29 MED ORDER — FENTANYL CITRATE (PF) 100 MCG/2ML IJ SOLN
INTRAMUSCULAR | Status: AC
  Filled 2020-02-29: qty 2

## 2020-02-29 MED ORDER — HEPARIN SODIUM (PORCINE) 5000 UNIT/ML IJ SOLN
5000.0000 [IU] | Freq: Three times a day (TID) | INTRAMUSCULAR | Status: DC
  Administered 2020-03-01 – 2020-03-03 (×7): 5000 [IU] via SUBCUTANEOUS
  Filled 2020-02-29 (×7): qty 1

## 2020-02-29 MED ORDER — PHENYLEPHRINE HCL-NACL 10-0.9 MG/250ML-% IV SOLN
INTRAVENOUS | Status: DC | PRN
  Administered 2020-02-29: 20 ug/min via INTRAVENOUS

## 2020-02-29 MED ORDER — ACETAMINOPHEN 10 MG/ML IV SOLN
1000.0000 mg | Freq: Once | INTRAVENOUS | Status: AC
  Administered 2020-02-29: 1000 mg via INTRAVENOUS

## 2020-02-29 MED ORDER — STERILE WATER FOR IRRIGATION IR SOLN
Status: DC | PRN
  Administered 2020-02-29: 1000 mL

## 2020-02-29 MED ORDER — ONDANSETRON HCL 4 MG/2ML IJ SOLN
INTRAMUSCULAR | Status: AC
  Filled 2020-02-29: qty 2

## 2020-02-29 MED ORDER — OXYCODONE HCL 5 MG PO TABS: 5.0000 mg | ORAL_TABLET | Freq: Once | ORAL | Status: DC | PRN

## 2020-02-29 MED ORDER — BUPIVACAINE HCL (PF) 0.25 % IJ SOLN
INTRAMUSCULAR | Status: AC
  Filled 2020-02-29: qty 30

## 2020-02-29 MED ORDER — OXYCODONE HCL 5 MG/5ML PO SOLN: 5.0000 mg | Freq: Once | ORAL | Status: DC | PRN

## 2020-02-29 MED ORDER — INSULIN ASPART 100 UNIT/ML ~~LOC~~ SOLN: 0.0000 [IU] | Freq: Three times a day (TID) | SUBCUTANEOUS | Status: DC

## 2020-02-29 MED ORDER — CEFAZOLIN SODIUM-DEXTROSE 2-4 GM/100ML-% IV SOLN
2.0000 g | INTRAVENOUS | Status: AC
  Administered 2020-02-29: 2 g via INTRAVENOUS

## 2020-02-29 MED ORDER — HYDROMORPHONE HCL 1 MG/ML IJ SOLN
0.2500 mg | INTRAMUSCULAR | Status: DC | PRN
  Administered 2020-02-29 – 2020-03-01 (×2): 0.25 mg via INTRAVENOUS
  Filled 2020-02-29 (×2): qty 1

## 2020-02-29 MED ORDER — ONDANSETRON HCL 4 MG/2ML IJ SOLN
4.0000 mg | Freq: Four times a day (QID) | INTRAMUSCULAR | Status: DC | PRN
  Administered 2020-02-29 – 2020-03-01 (×2): 4 mg via INTRAVENOUS
  Filled 2020-02-29 (×3): qty 2

## 2020-02-29 MED ORDER — ROCURONIUM BROMIDE 10 MG/ML (PF) SYRINGE
PREFILLED_SYRINGE | INTRAVENOUS | Status: AC
  Filled 2020-02-29: qty 10

## 2020-02-29 MED ORDER — DEXAMETHASONE SODIUM PHOSPHATE 10 MG/ML IJ SOLN
INTRAMUSCULAR | Status: DC | PRN
  Administered 2020-02-29: 8 mg via INTRAVENOUS

## 2020-02-29 MED ORDER — CEFAZOLIN SODIUM-DEXTROSE 2-4 GM/100ML-% IV SOLN
INTRAVENOUS | Status: AC
  Filled 2020-02-29: qty 100

## 2020-02-29 MED ORDER — BUPIVACAINE HCL (PF) 0.25 % IJ SOLN
INTRAMUSCULAR | Status: DC | PRN
  Administered 2020-02-29: 30 mL

## 2020-02-29 MED ORDER — SUGAMMADEX SODIUM 200 MG/2ML IV SOLN
INTRAVENOUS | Status: DC | PRN
  Administered 2020-02-29: 200 mg via INTRAVENOUS

## 2020-02-29 MED ORDER — PROPOFOL 10 MG/ML IV BOLUS
INTRAVENOUS | Status: AC
  Filled 2020-02-29: qty 20

## 2020-02-29 MED ORDER — ROCURONIUM BROMIDE 10 MG/ML (PF) SYRINGE
PREFILLED_SYRINGE | INTRAVENOUS | Status: DC | PRN
  Administered 2020-02-29: 20 mg via INTRAVENOUS
  Administered 2020-02-29: 30 mg via INTRAVENOUS
  Administered 2020-02-29: 70 mg via INTRAVENOUS

## 2020-02-29 MED ORDER — FENTANYL CITRATE (PF) 100 MCG/2ML IJ SOLN
25.0000 ug | INTRAMUSCULAR | Status: DC | PRN
  Administered 2020-02-29 (×2): 50 ug via INTRAVENOUS

## 2020-02-29 MED ORDER — PROPOFOL 10 MG/ML IV BOLUS
INTRAVENOUS | Status: DC | PRN
  Administered 2020-02-29: 120 mg via INTRAVENOUS
  Administered 2020-02-29: 20 mg via INTRAVENOUS

## 2020-02-29 MED ORDER — ACETAMINOPHEN 500 MG PO TABS: 1000.0000 mg | ORAL_TABLET | ORAL | Status: AC

## 2020-02-29 MED ORDER — PANTOPRAZOLE SODIUM 40 MG PO TBEC
40.0000 mg | DELAYED_RELEASE_TABLET | Freq: Two times a day (BID) | ORAL | Status: DC
  Administered 2020-03-01 – 2020-03-04 (×7): 40 mg via ORAL
  Filled 2020-02-29 (×7): qty 1

## 2020-02-29 MED ORDER — 0.9 % SODIUM CHLORIDE (POUR BTL) OPTIME
TOPICAL | Status: DC | PRN
  Administered 2020-02-29: 1000 mL

## 2020-02-29 MED ORDER — ONDANSETRON HCL 4 MG/2ML IJ SOLN
INTRAMUSCULAR | Status: DC | PRN
  Administered 2020-02-29: 4 mg via INTRAVENOUS

## 2020-02-29 MED ORDER — ONDANSETRON 4 MG PO TBDP: 4.0000 mg | ORAL_TABLET | Freq: Four times a day (QID) | ORAL | Status: DC | PRN

## 2020-02-29 MED ORDER — FENTANYL CITRATE (PF) 250 MCG/5ML IJ SOLN
INTRAMUSCULAR | Status: DC | PRN
Start: 2020-02-29 — End: 2020-02-29
  Administered 2020-02-29: 25 ug via INTRAVENOUS
  Administered 2020-02-29: 150 ug via INTRAVENOUS
  Administered 2020-02-29: 25 ug via INTRAVENOUS

## 2020-02-29 MED ORDER — BUPIVACAINE LIPOSOME 1.3 % IJ SUSP
INTRAMUSCULAR | Status: DC | PRN
  Administered 2020-02-29: 20 mL

## 2020-02-29 MED ORDER — MIDAZOLAM HCL 2 MG/2ML IJ SOLN
INTRAMUSCULAR | Status: AC
  Filled 2020-02-29: qty 2

## 2020-02-29 MED ORDER — DEXAMETHASONE SODIUM PHOSPHATE 10 MG/ML IJ SOLN
INTRAMUSCULAR | Status: AC
  Filled 2020-02-29: qty 1

## 2020-02-29 MED ORDER — LIDOCAINE 2% (20 MG/ML) 5 ML SYRINGE
INTRAMUSCULAR | Status: DC | PRN
  Administered 2020-02-29: 100 mg via INTRAVENOUS

## 2020-02-29 MED ORDER — MIDAZOLAM HCL 5 MG/5ML IJ SOLN
INTRAMUSCULAR | Status: DC | PRN
  Administered 2020-02-29 (×2): 1 mg via INTRAVENOUS

## 2020-02-29 MED ORDER — CHLORHEXIDINE GLUCONATE CLOTH 2 % EX PADS: 6.0000 | MEDICATED_PAD | Freq: Once | CUTANEOUS | Status: DC

## 2020-02-29 MED ORDER — LIDOCAINE 2% (20 MG/ML) 5 ML SYRINGE
INTRAMUSCULAR | Status: AC
  Filled 2020-02-29: qty 5

## 2020-02-29 MED ORDER — ACETAMINOPHEN 500 MG PO TABS
ORAL_TABLET | ORAL | Status: AC
  Administered 2020-02-29: 1000 mg via ORAL
  Filled 2020-02-29: qty 2

## 2020-02-29 MED ORDER — KETAMINE HCL 50 MG/5ML IJ SOSY
PREFILLED_SYRINGE | INTRAMUSCULAR | Status: AC
  Filled 2020-02-29: qty 5

## 2020-02-29 MED ORDER — ONDANSETRON HCL 4 MG/2ML IJ SOLN: 4.0000 mg | Freq: Four times a day (QID) | INTRAMUSCULAR | Status: DC | PRN

## 2020-02-29 MED ORDER — FENTANYL CITRATE (PF) 250 MCG/5ML IJ SOLN
INTRAMUSCULAR | Status: AC
  Filled 2020-02-29: qty 5

## 2020-02-29 MED ORDER — CHLORHEXIDINE GLUCONATE 0.12 % MT SOLN
OROMUCOSAL | Status: AC
  Administered 2020-02-29: 15 mL via OROMUCOSAL
  Filled 2020-02-29: qty 15

## 2020-02-29 MED ORDER — ORAL CARE MOUTH RINSE: 15.0000 mL | Freq: Once | OROMUCOSAL | Status: AC

## 2020-02-29 MED ORDER — BUPIVACAINE LIPOSOME 1.3 % IJ SUSP
20.0000 mL | Freq: Once | INTRAMUSCULAR | Status: DC
  Filled 2020-02-29: qty 20

## 2020-02-29 MED ORDER — LIDOCAINE-EPINEPHRINE (PF) 1 %-1:200000 IJ SOLN
INTRAMUSCULAR | Status: DC | PRN
  Administered 2020-02-29: 30 mL

## 2020-02-29 MED ORDER — OXYCODONE HCL 5 MG PO TABS
5.0000 mg | ORAL_TABLET | ORAL | Status: DC | PRN
  Administered 2020-02-29: 5 mg via ORAL
  Administered 2020-03-01 (×3): 10 mg via ORAL
  Administered 2020-03-02: 5 mg via ORAL
  Filled 2020-02-29: qty 1
  Filled 2020-02-29: qty 2
  Filled 2020-02-29: qty 1
  Filled 2020-02-29 (×2): qty 2

## 2020-02-29 SURGICAL SUPPLY — 100 items
BLADE EXTENDED COATED 6.5IN (ELECTRODE) IMPLANT
CANNULA REDUC XI 12-8 STAPL (CANNULA) ×2
CANNULA REDUCER 12-8 DVNC XI (CANNULA) ×1 IMPLANT
CHLORAPREP W/TINT 26 (MISCELLANEOUS) ×2 IMPLANT
CLIP VESOLOCK LG 6/CT PURPLE (CLIP) IMPLANT
CLIP VESOLOCK MED 6/CT (CLIP) IMPLANT
COVER MAYO STAND STRL (DRAPES) ×2 IMPLANT
COVER SURGICAL LIGHT HANDLE (MISCELLANEOUS) ×4 IMPLANT
COVER TIP SHEARS 8 DVNC (MISCELLANEOUS) IMPLANT
COVER TIP SHEARS 8MM DA VINCI (MISCELLANEOUS)
COVER WAND RF STERILE (DRAPES) IMPLANT
DECANTER SPIKE VIAL GLASS SM (MISCELLANEOUS) ×2 IMPLANT
DEFOGGER SCOPE WARMER CLEARIFY (MISCELLANEOUS) ×2 IMPLANT
DERMABOND ADVANCED (GAUZE/BANDAGES/DRESSINGS) ×1
DERMABOND ADVANCED .7 DNX12 (GAUZE/BANDAGES/DRESSINGS) ×1 IMPLANT
DEVICE TROCAR PUNCTURE CLOSURE (ENDOMECHANICALS) IMPLANT
DRAIN CHANNEL 19F RND (DRAIN) IMPLANT
DRAPE ARM DVNC X/XI (DISPOSABLE) ×4 IMPLANT
DRAPE COLUMN DVNC XI (DISPOSABLE) ×1 IMPLANT
DRAPE CV SPLIT W-CLR ANES SCRN (DRAPES) ×2 IMPLANT
DRAPE DA VINCI XI ARM (DISPOSABLE) ×8
DRAPE DA VINCI XI COLUMN (DISPOSABLE) ×2
DRAPE ORTHO SPLIT 77X108 STRL (DRAPES) ×2
DRAPE SURG IRRIG POUCH 19X23 (DRAPES) ×2 IMPLANT
DRAPE SURG ORHT 6 SPLT 77X108 (DRAPES) ×1 IMPLANT
DRSG OPSITE POSTOP 4X6 (GAUZE/BANDAGES/DRESSINGS) IMPLANT
DRSG OPSITE POSTOP 4X8 (GAUZE/BANDAGES/DRESSINGS) IMPLANT
ELECT REM PT RETURN 9FT ADLT (ELECTROSURGICAL) ×2
ELECTRODE REM PT RTRN 9FT ADLT (ELECTROSURGICAL) ×1 IMPLANT
ENDOLOOP SUT PDS II  0 18 (SUTURE)
ENDOLOOP SUT PDS II 0 18 (SUTURE) IMPLANT
GAUZE SPONGE 4X4 12PLY STRL (GAUZE/BANDAGES/DRESSINGS) IMPLANT
GLOVE BIO SURGEON STRL SZ 6 (GLOVE) ×6 IMPLANT
GLOVE INDICATOR 6.5 STRL GRN (GLOVE) ×6 IMPLANT
GOWN STRL REUS W/ TWL LRG LVL3 (GOWN DISPOSABLE) ×2 IMPLANT
GOWN STRL REUS W/ TWL XL LVL3 (GOWN DISPOSABLE) ×2 IMPLANT
GOWN STRL REUS W/TWL 2XL LVL3 (GOWN DISPOSABLE) ×6 IMPLANT
GOWN STRL REUS W/TWL LRG LVL3 (GOWN DISPOSABLE) ×4
GOWN STRL REUS W/TWL XL LVL3 (GOWN DISPOSABLE) ×4
KIT BASIN OR (CUSTOM PROCEDURE TRAY) ×2 IMPLANT
KIT TURNOVER KIT B (KITS) IMPLANT
L-HOOK LAP DISP 36CM (ELECTROSURGICAL) ×2
LHOOK LAP DISP 36CM (ELECTROSURGICAL) ×1 IMPLANT
NEEDLE HYPO 22GX1.5 SAFETY (NEEDLE) ×2 IMPLANT
NEEDLE INSUFFLATION 14GA 120MM (NEEDLE) ×2 IMPLANT
OBTURATOR OPTICAL STANDARD 8MM (TROCAR)
OBTURATOR OPTICAL STND 8 DVNC (TROCAR)
OBTURATOR OPTICALSTD 8 DVNC (TROCAR) IMPLANT
PAD ARMBOARD 7.5X6 YLW CONV (MISCELLANEOUS) ×4 IMPLANT
PENCIL BUTTON HOLSTER BLD 10FT (ELECTRODE) ×2 IMPLANT
PENCIL SMOKE EVACUATOR (MISCELLANEOUS) IMPLANT
POUCH RETRIEVAL ECOSAC 10 (ENDOMECHANICALS) ×1 IMPLANT
POUCH RETRIEVAL ECOSAC 10MM (ENDOMECHANICALS) ×2
RELOAD STAPLER 3.5X60 BLU DVNC (STAPLE) ×5 IMPLANT
RELOAD STAPLER 4.3X60 GRN DVNC (STAPLE) IMPLANT
SCISSORS LAP 5X35 DISP (ENDOMECHANICALS) IMPLANT
SEAL CANN UNIV 5-8 DVNC XI (MISCELLANEOUS) ×3 IMPLANT
SEAL XI 5MM-8MM UNIVERSAL (MISCELLANEOUS) ×6
SEALER VESSEL DA VINCI XI (MISCELLANEOUS) ×2
SEALER VESSEL EXT DVNC XI (MISCELLANEOUS) ×1 IMPLANT
SET IRRIG TUBING LAPAROSCOPIC (IRRIGATION / IRRIGATOR) ×2 IMPLANT
SET TUBE SMOKE EVAC HIGH FLOW (TUBING) ×2 IMPLANT
SLEEVE XCEL OPT CAN 5 100 (ENDOMECHANICALS) IMPLANT
SPONGE LAP 18X18 RF (DISPOSABLE) IMPLANT
STAPLER 60 DA VINCI SURE FORM (STAPLE) ×2
STAPLER 60 SUREFORM DVNC (STAPLE) ×1 IMPLANT
STAPLER CANNULA SEAL DVNC XI (STAPLE) ×1 IMPLANT
STAPLER CANNULA SEAL XI (STAPLE) ×2
STAPLER RELOAD 3.5X60 BLU DVNC (STAPLE) ×5
STAPLER RELOAD 3.5X60 BLUE (STAPLE) ×10
STAPLER RELOAD 4.3X60 GREEN (STAPLE)
STAPLER RELOAD 4.3X60 GRN DVNC (STAPLE)
STAPLER VISISTAT 35W (STAPLE) ×2 IMPLANT
STOPCOCK 4 WAY LG BORE MALE ST (IV SETS) ×2 IMPLANT
SUT DVC VLOC 180 2-0 12IN GS21 (SUTURE)
SUT ETHILON 2 0 FS 18 (SUTURE) IMPLANT
SUT MNCRL AB 4-0 PS2 18 (SUTURE) ×6 IMPLANT
SUT NOVA 1 T20/GS 25DT (SUTURE) ×2 IMPLANT
SUT NOVA NAB DX-16 0-1 5-0 T12 (SUTURE) ×2 IMPLANT
SUT PDS AB 1 CTX 36 (SUTURE) IMPLANT
SUT PDS AB 2-0 CT2 27 (SUTURE) IMPLANT
SUT PROLENE 2 0 SH DA (SUTURE) IMPLANT
SUT SILK 2 0 (SUTURE)
SUT SILK 2 0 SH CR/8 (SUTURE) IMPLANT
SUT SILK 2-0 18XBRD TIE 12 (SUTURE) IMPLANT
SUT SILK 3 0 (SUTURE)
SUT SILK 3 0 SH CR/8 (SUTURE) IMPLANT
SUT SILK 3-0 18XBRD TIE 12 (SUTURE) IMPLANT
SUT VIC AB 3-0 SH 27 (SUTURE) ×2
SUT VIC AB 3-0 SH 27X BRD (SUTURE) ×1 IMPLANT
SUT VICRYL 0 TIES 12 18 (SUTURE) IMPLANT
SUTURE DVC VL 180 2-0 12INGS21 (SUTURE) IMPLANT
SYR 30ML SLIP (SYRINGE) ×2 IMPLANT
TIP INNERVISION DETACH 56FR (MISCELLANEOUS) ×2 IMPLANT
TOWEL GREEN STERILE FF (TOWEL DISPOSABLE) ×2 IMPLANT
TRAY FOLEY MTR SLVR 14FR STAT (SET/KITS/TRAYS/PACK) ×2 IMPLANT
TRAY FOLEY MTR SLVR 16FR STAT (SET/KITS/TRAYS/PACK) ×2 IMPLANT
TRAY LAPAROSCOPIC MC (CUSTOM PROCEDURE TRAY) ×2 IMPLANT
TROCAR ADV FIXATION 5X100MM (TROCAR) ×2 IMPLANT
TROCAR XCEL NON-BLD 5MMX100MML (ENDOMECHANICALS) IMPLANT

## 2020-02-29 NOTE — Anesthesia Procedure Notes (Signed)
Central Venous Catheter Insertion Performed by: Albertha Ghee, MD, anesthesiologist Start/End9/21/2021 12:13 PM, 02/29/2020 12:25 PM Patient location: Pre-op. Preanesthetic checklist: patient identified, IV checked, site marked, risks and benefits discussed, surgical consent, monitors and equipment checked, pre-op evaluation, timeout performed and anesthesia consent Position: Trendelenburg Lidocaine 1% used for infiltration and patient sedated Hand hygiene performed , maximum sterile barriers used  and Seldinger technique used Catheter size: 7 Fr Central line was placed.Double lumen Procedure performed using ultrasound guided technique. Ultrasound Notes:anatomy identified, needle tip was noted to be adjacent to the nerve/plexus identified and no ultrasound evidence of intravascular and/or intraneural injection Attempts: 1 Following insertion, line sutured, dressing applied and Biopatch. Post procedure assessment: blood return through all ports, free fluid flow and no air  Patient tolerated the procedure well with no immediate complications.

## 2020-02-29 NOTE — Interval H&P Note (Signed)
History and Physical Interval Note:  02/29/2020 1:06 PM  Bridget Grant  has presented today for surgery, with the diagnosis of gist stomach.  The various methods of treatment have been discussed with the patient and family. After consideration of risks, benefits and other options for treatment, the patient has consented to  Procedure(s) with comments: ROBOTIC PARTIAL GASTRECTOMY (N/A) - GEN AND PECTORAL BLOCK as a surgical intervention.  The patient's history has been reviewed, patient examined, no change in status, stable for surgery.  I have reviewed the patient's chart and labs.  Questions were answered to the patient's satisfaction.     Stark Klein

## 2020-02-29 NOTE — Anesthesia Procedure Notes (Signed)
Procedure Name: Intubation Performed by: Milford Cage, CRNA Pre-anesthesia Checklist: Patient identified, Emergency Drugs available, Suction available and Patient being monitored Patient Re-evaluated:Patient Re-evaluated prior to induction Oxygen Delivery Method: Circle System Utilized Preoxygenation: Pre-oxygenation with 100% oxygen Induction Type: IV induction Ventilation: Mask ventilation without difficulty Laryngoscope Size: Mac and 3 Grade View: Grade I Tube type: Oral Tube size: 7.5 mm Number of attempts: 1 Airway Equipment and Method: Stylet Placement Confirmation: ETT inserted through vocal cords under direct vision,  positive ETCO2 and breath sounds checked- equal and bilateral Secured at: 22 cm Tube secured with: Tape Dental Injury: Teeth and Oropharynx as per pre-operative assessment

## 2020-02-29 NOTE — Transfer of Care (Signed)
Immediate Anesthesia Transfer of Care Note  Patient: Bridget Grant  Procedure(s) Performed: ROBOTIC PARTIAL GASTRECTOMY (N/A Abdomen)  Patient Location: PACU  Anesthesia Type:General  Level of Consciousness: drowsy  Airway & Oxygen Therapy: Patient Spontanous Breathing and Patient connected to face mask oxygen  Post-op Assessment: Report given to RN and Post -op Vital signs reviewed and stable  Post vital signs: Reviewed and stable  Last Vitals:  Vitals Value Taken Time  BP 137/59 02/29/20 1628  Temp    Pulse 82 02/29/20 1631  Resp 25 02/29/20 1631  SpO2 99 % 02/29/20 1631  Vitals shown include unvalidated device data.  Last Pain:  Vitals:   02/29/20 1038  TempSrc:   PainSc: 0-No pain      Patients Stated Pain Goal: 2 (11/94/17 4081)  Complications: No complications documented.

## 2020-02-29 NOTE — Anesthesia Preprocedure Evaluation (Signed)
Anesthesia Evaluation  Patient identified by MRN, date of birth, ID band Patient awake    Reviewed: Allergy & Precautions, H&P , NPO status , Patient's Chart, lab work & pertinent test results  History of Anesthesia Complications (+) PONV and history of anesthetic complications  Airway Mallampati: II   Neck ROM: full    Dental   Pulmonary neg pulmonary ROS,    breath sounds clear to auscultation       Cardiovascular hypertension,  Rhythm:regular Rate:Normal     Neuro/Psych  Headaches, PSYCHIATRIC DISORDERS Anxiety Depression    GI/Hepatic Stomach CA   Endo/Other  diabetes, Type 2obese  Renal/GU      Musculoskeletal  (+) Arthritis ,   Abdominal   Peds  Hematology  (+) Blood dyscrasia, anemia ,   Anesthesia Other Findings   Reproductive/Obstetrics                             Anesthesia Physical Anesthesia Plan  ASA: III  Anesthesia Plan: General   Post-op Pain Management:    Induction: Intravenous  PONV Risk Score and Plan: 3 and Ondansetron, Dexamethasone, Midazolam and Treatment may vary due to age or medical condition  Airway Management Planned: Oral ETT  Additional Equipment:   Intra-op Plan:   Post-operative Plan: Extubation in OR  Informed Consent: I have reviewed the patients History and Physical, chart, labs and discussed the procedure including the risks, benefits and alternatives for the proposed anesthesia with the patient or authorized representative who has indicated his/her understanding and acceptance.       Plan Discussed with: CRNA, Anesthesiologist and Surgeon  Anesthesia Plan Comments:         Anesthesia Quick Evaluation

## 2020-02-29 NOTE — Op Note (Signed)
PRE-OPERATIVE DIAGNOSIS: GIST proximal stomach  POST-OPERATIVE DIAGNOSIS:  Same  PROCEDURE:  Procedure(s): Robotic partial gastrectomy  SURGEON:  Surgeon(s): Stark Klein, MD  ASSISTANT:   Daiva Huge, MD  ANESTHESIA:   local and general  DRAINS: none   LOCAL MEDICATIONS USED:  BUPIVICAINE  and LIDOCAINE   SPECIMEN:  Source of Specimen:  Partial gastrectomy with mass  DISPOSITION OF SPECIMEN:  PATHOLOGY  COUNTS:  YES  PLAN OF CARE: Admit to inpatient   PATIENT DISPOSITION:  PACU - hemodynamically stable.   FINDINGS:  Firm mass with some fronds in the gastric lumen.  Thickened wall anterior to mass  PROCEDURE:  Pt was identified in the holding area and taken to the operating room and placed supine on the operating room table.  General anesthesia was induced.  A foley catheter was placed.  The abdomen was prepped and draped in sterile fashion.  Timeout was performed according to the surgical safety checklist.  When all was correct, we continued.    The patient was placed in reverse Trendelenberg position and rotated to the left.  A small incision was made at the left costal margin and the Veress needle was inserted.  Saline flowed easily in and pneumoperitoneum was achieved easily.  Two 8 mm robotic trocars were placed in the left abdomen.  A 12 mm robotic trocar was placed in the supraumbilical location.  Another 8 mm robotic trocar was placed in the right mid abdomen and a 5 mm balloon trocar in the right upper quadrant.  The Nathansen retractor was placed in the subxiphoid location to retract the left lateral segment of the liver.  The robot was docked.  The short gastric arteries were taken down with the vessel sealer.  Care was taken to avoid the spleen.  The stomach was mobilized up the greater curve to the GE junction.  The mass was visible.  The 47 Fr lighted Bougie retractor was placed by Dr. Marcie Bal.  There was adequate diameter of the esophagus to staple off the  mass.  Five fires of the robotic stapler stapler were used to divide the proximal greater curve with the mass from the stomach.  There was no bleeding at the staple line.  The robot was undocked.    The mass was retrieved with an ecosac via the 12 mm port. The 12 mm port had to be enlarged to retrieve the mass.  This was examined to make sure the gross margin was negative.  The mass was widely free of the staple line, but the anterior wall of the stomach was thickened.  It was not clear if this was malignant change or not.    The left upper quadrant was reexamined for hemostasis.  There was no evidence of bleeding.  A 4 quadrant inspection was performed and was negative for signs of succus or blood. The ports were all removed.  The 12 mm port was closed with interrupted #1 novofil pops.   The skin of all the incisions was closed with 4-0 Monocryl in subcuticular fashion.  The wounds were cleaned, dried, and dressed with dermabond. Needle, sponge, and instrument counts were correct.  The patient was awakened from anesthesia and taken to the PACU in stable condition.

## 2020-03-01 ENCOUNTER — Encounter (HOSPITAL_COMMUNITY): Payer: Self-pay | Admitting: General Surgery

## 2020-03-01 LAB — GLUCOSE, CAPILLARY
Glucose-Capillary: 191 mg/dL — ABNORMAL HIGH (ref 70–99)
Glucose-Capillary: 195 mg/dL — ABNORMAL HIGH (ref 70–99)
Glucose-Capillary: 200 mg/dL — ABNORMAL HIGH (ref 70–99)
Glucose-Capillary: 151 mg/dL — ABNORMAL HIGH (ref 70–99)

## 2020-03-01 LAB — CBC
HCT: 27.1 % — ABNORMAL LOW (ref 36.0–46.0)
Hemoglobin: 8.8 g/dL — ABNORMAL LOW (ref 12.0–15.0)
MCH: 32.7 pg (ref 26.0–34.0)
MCHC: 32.5 g/dL (ref 30.0–36.0)
MCV: 100.7 fL — ABNORMAL HIGH (ref 80.0–100.0)
Platelets: 289 10*3/uL (ref 150–400)
RBC: 2.69 MIL/uL — ABNORMAL LOW (ref 3.87–5.11)
RDW: 14.6 % (ref 11.5–15.5)
WBC: 11.5 10*3/uL — ABNORMAL HIGH (ref 4.0–10.5)
nRBC: 0 % (ref 0.0–0.2)

## 2020-03-01 LAB — BASIC METABOLIC PANEL
Anion gap: 14 (ref 5–15)
BUN: 12 mg/dL (ref 8–23)
CO2: 20 mmol/L — ABNORMAL LOW (ref 22–32)
Calcium: 8.6 mg/dL — ABNORMAL LOW (ref 8.9–10.3)
Chloride: 103 mmol/L (ref 98–111)
Creatinine, Ser: 1.04 mg/dL — ABNORMAL HIGH (ref 0.44–1.00)
GFR calc Af Amer: >60 mL/min (ref >60)
GFR calc non Af Amer: 54 mL/min — ABNORMAL LOW (ref >60)
Glucose, Bld: 237 mg/dL — ABNORMAL HIGH (ref 70–99)
Potassium: 4.4 mmol/L (ref 3.5–5.1)
Sodium: 137 mmol/L (ref 135–145)

## 2020-03-01 MED ORDER — INSULIN ASPART 100 UNIT/ML ~~LOC~~ SOLN
0.0000 [IU] | Freq: Three times a day (TID) | SUBCUTANEOUS | Status: DC
  Administered 2020-03-01 (×3): 4 [IU] via SUBCUTANEOUS
  Administered 2020-03-02 – 2020-03-03 (×3): 3 [IU] via SUBCUTANEOUS
  Administered 2020-03-03: 4 [IU] via SUBCUTANEOUS
  Administered 2020-03-04: 3 [IU] via SUBCUTANEOUS

## 2020-03-01 MED ORDER — INSULIN ASPART 100 UNIT/ML ~~LOC~~ SOLN: 0.0000 [IU] | Freq: Every day | SUBCUTANEOUS | Status: DC

## 2020-03-01 MED ORDER — INFLUENZA VAC A&B SA ADJ QUAD 0.5 ML IM PRSY
0.5000 mL | PREFILLED_SYRINGE | INTRAMUSCULAR | Status: DC
  Filled 2020-03-01 (×2): qty 0.5

## 2020-03-01 MED ORDER — CHLORHEXIDINE GLUCONATE CLOTH 2 % EX PADS
6.0000 | MEDICATED_PAD | Freq: Every day | CUTANEOUS | Status: DC
  Administered 2020-03-01 – 2020-03-03 (×3): 6 via TOPICAL

## 2020-03-01 NOTE — Progress Notes (Signed)
Patient requesting all 4 side rails up 

## 2020-03-01 NOTE — Progress Notes (Signed)
1 Day Post-Op  Robotic partial gastrectomy  Subjective: Overnight had bilateral shoulder discomfort. Reports pain 5 out fo 10. Having mild nausea but no emesis, does not feel hungry. H/H 8.8 from 11.2. Mild Cr increase from 0.8 to 1, adequate UOP (about 1L) with foley in place.   Objective: Vital signs in last 24 hours: Temp:  [97.3 F (36.3 C)-98.4 F (36.9 C)] 97.9 F (36.6 C) (09/22 0437) Pulse Rate:  [79-101] 101 (09/22 0437) Resp:  [16-27] 18 (09/22 0437) BP: (102-167)/(53-71) 120/62 (09/22 0437) SpO2:  [92 %-100 %] 100 % (09/22 0437) Weight:  [93.9 kg] 93.9 kg (09/21 1025)    Intake/Output from previous day: 09/21 0701 - 09/22 0700 In: 1000 [I.V.:1000] Out: 1100 [Urine:1000; Blood:100] Intake/Output this shift: No intake/output data recorded.  Physical exam:  Gen: In no acute distress Heart: RRR Lungs: CTAB Abd: Soft, appropriately tender to palpation, mildly distended, incision c/d/i Ext: No edema  Lab Results:  BMET Recent Labs    02/29/20 1831 03/01/20 0510  NA  --  137  K  --  4.4  CL  --  103  CO2  --  20*  GLUCOSE  --  237*  BUN  --  12  CREATININE 0.90 1.04*  CALCIUM  --  8.6*   PT/INR No results for input(s): LABPROT, INR in the last 72 hours. ABG No results for input(s): PHART, HCO3 in the last 72 hours.  Invalid input(s): PCO2, PO2  Studies/Results: DG Chest 1 View  Result Date: 02/29/2020 CLINICAL DATA:  Central line placement EXAM: CHEST  1 VIEW COMPARISON:  02/04/2005 FINDINGS: Single frontal view of the chest demonstrates right internal jugular catheter tip overlying superior vena cava. Cardiac silhouette is enlarged. Patchy retrocardiac consolidation may reflect atelectasis. No effusion or pneumothorax. No acute bony abnormalities. IMPRESSION: 1. No complication after right internal jugular catheter placement. 2. Retrocardiac consolidation favor atelectasis. Electronically Signed   By: Randa Ngo M.D.   On: 02/29/2020 17:38     Anti-infectives: Anti-infectives (From admission, onward)   Start     Dose/Rate Route Frequency Ordered Stop   02/29/20 1030  ceFAZolin (ANCEF) IVPB 2g/100 mL premix        2 g 200 mL/hr over 30 Minutes Intravenous On call to O.R. 02/29/20 1025 02/29/20 1400   02/29/20 1029  ceFAZolin (ANCEF) 2-4 GM/100ML-% IVPB       Note to Pharmacy: Odelia Gage   : cabinet override      02/29/20 1029 02/29/20 1413      Assessment/Plan: 26F with PMH of HTN, DM s/p Procedure(s): ROBOTIC PARTIAL GASTRECTOMY   LOS: 1 day   -clear diet today -continue IVF until appropriate PO intake -remove foley -Zofran PRN for nausea -ISS increased changed to resistant due to hyperglycemia -Pain: Oxy PRN and Dilaudid IV for breakthrough -DVT ppx: start subcutaneous heparin today -PT consulted, pending evaluation -Encourage IS and ambulation  Daiva Huge, MD 03/01/2020

## 2020-03-01 NOTE — Social Work (Signed)
CSW acknowledging recommendation for SNF placement. Will meet with pt on 9/23 to discuss and initiate care planning for discharge.   Westley Hummer, MSW, [place] [facility]

## 2020-03-01 NOTE — Anesthesia Postprocedure Evaluation (Signed)
Anesthesia Post Note  Patient: ALLYSIA Grant  Procedure(s) Performed: ROBOTIC PARTIAL GASTRECTOMY (N/A Abdomen)     Patient location during evaluation: PACU Anesthesia Type: General Level of consciousness: awake and alert Pain management: pain level controlled Vital Signs Assessment: post-procedure vital signs reviewed and stable Respiratory status: spontaneous breathing, nonlabored ventilation, respiratory function stable and patient connected to nasal cannula oxygen Cardiovascular status: blood pressure returned to baseline and stable Postop Assessment: no apparent nausea or vomiting Anesthetic complications: no   No complications documented.  Last Vitals:  Vitals:   03/01/20 1047 03/01/20 1546  BP:  (!) 144/60  Pulse:  (!) 101  Resp:  18  Temp:  37.1 C  SpO2: 90% 90%    Last Pain:  Vitals:   03/01/20 1722  TempSrc:   PainSc: 7                  Johntavious Francom S

## 2020-03-01 NOTE — Evaluation (Signed)
Physical Therapy Evaluation Patient Details Name: Bridget Grant MRN: [id number] DOB: 1945-10-11 Today's Date: 03/01/2020   History of Present Illness  The patient is a 74 year old female who presents with upper gastrointestinal bleeding. Pt is a 74 yo F referred by our inpatient team for a bleeding gastric mass.  She presented to the hospital with hematemesis and melanoma 01/13/2020.  She got 3 units of pRBCs and an upper endoscopy.  She was seen to have a friable proximal gastric mass consistent with malignancy.  The patient had staging scans that also showed a left breast mass that was 2.1 cm. S/P partial gastrectomy.  Clinical Impression  Patient received in bed, agrees to PT assessment. Reports abdominal and B shoulder pain. K pad to B shoulder in bed. She requires min assist with supine to sit due to pain with mobility. Transfers with min assist from elevated surface. WIth B UE assist she is able to ambulate 5 feet from bed to recliner. She will continue to benefit from skilled PT while here to improve functional mobility and independence. I anticipate that she will progress well and hopeful that she will be able to return home at time of discharge.  Patient left in recliner with call bell and needs met.     Follow Up Recommendations SNF;Supervision for mobility/OOB    Equipment Recommendations  Rolling walker with 5" wheels;Other (comment) (dependent on progress)    Recommendations for Other Services       Precautions / Restrictions Precautions Precaution Comments: mod fall Restrictions Weight Bearing Restrictions: No      Mobility  Bed Mobility Overal bed mobility: Needs Assistance Bed Mobility: Supine to Sit     Supine to sit: Min assist     General bed mobility comments: requires min assist to raise trunk and to scoot hips forward using bed pad  Transfers Overall transfer level: Needs assistance Equipment used: None Transfers: Sit to/from Stand Sit to Stand: Min  assist;From elevated surface         General transfer comment: transfers sit to stand with min assist due to pain  Ambulation/Gait Ambulation/Gait assistance: Min assist Gait Distance (Feet): 5 Feet Assistive device: 1 person hand held assist;None Gait Pattern/deviations: Step-to pattern;Decreased stride length;Decreased step length - right;Decreased step length - left;Trunk flexed Gait velocity: decr   General Gait Details: patient ambulated to recliner with B hand held assist. Slow steady gait.  Stairs            Wheelchair Mobility    Modified Rankin (Stroke Patients Only)       Balance Overall balance assessment: Needs assistance Sitting-balance support: Feet supported Sitting balance-Leahy Scale: Good     Standing balance support: Bilateral upper extremity supported;During functional activity Standing balance-Leahy Scale: Fair Standing balance comment: reliant on B UE support at this time due to pain with mobility                             Pertinent Vitals/Pain Pain Assessment: Faces Faces Pain Scale: Hurts even more Pain Location: abdomen, B Shoulders Pain Descriptors / Indicators: Discomfort;Sore;Grimacing;Guarding;Moaning Pain Intervention(s): Limited activity within patient's tolerance;Monitored during session    Home Living Family/patient expects to be discharged to:: Private residence Living Arrangements: Spouse/significant other Available Help at Discharge: Available 24 hours/day;Available PRN/intermittently Type of Home: House Home Access: Ramped entrance     Home Layout: One level Home Equipment: None Additional Comments: states she lives with her husband  Physical Therapy Evaluation Patient Details Name: Bridget Grant MRN: [id number] DOB: 1945/09/20 Today's Date: 03/01/2020   History of Present Illness  The patient is a 74 year old female who presents with upper gastrointestinal bleeding. Pt is a 74 yo F referred by our inpatient team for a bleeding gastric mass.  She presented to the hospital with hematemesis and melanoma 01/13/2020.  She got 3 units of pRBCs and an upper endoscopy.  She was seen to have a friable proximal gastric mass consistent with malignancy.  The patient had staging scans that also showed a left breast mass that was 2.1 cm. S/P partial gastrectomy.  Clinical Impression  Patient received in bed, agrees to PT assessment. Reports abdominal and B shoulder pain. K pad to B shoulder in bed. She requires min assist with supine to sit due to pain with mobility. Transfers with min assist from elevated surface. WIth B UE assist she is able to ambulate 5 feet from bed to recliner. She will continue to benefit from skilled PT while here to improve functional mobility and independence. I anticipate that she will progress well and hopeful that she will be able to return home at time of discharge.  Patient left in recliner with call bell and needs met.     Follow Up Recommendations SNF;Supervision for mobility/OOB    Equipment Recommendations  Rolling walker with 5" wheels;Other (comment) (dependent on progress)    Recommendations for Other Services       Precautions / Restrictions Precautions Precaution Comments: mod fall Restrictions Weight Bearing Restrictions: No      Mobility  Bed Mobility Overal bed mobility: Needs Assistance Bed Mobility: Supine to Sit     Supine to sit: Min assist     General bed mobility comments: requires min assist to raise trunk and to scoot hips forward using bed pad  Transfers Overall transfer level: Needs assistance Equipment used: None Transfers: Sit to/from Stand Sit to Stand: Min  assist;From elevated surface         General transfer comment: transfers sit to stand with min assist due to pain  Ambulation/Gait Ambulation/Gait assistance: Min assist Gait Distance (Feet): 5 Feet Assistive device: 1 person hand held assist;None Gait Pattern/deviations: Step-to pattern;Decreased stride length;Decreased step length - right;Decreased step length - left;Trunk flexed Gait velocity: decr   General Gait Details: patient ambulated to recliner with B hand held assist. Slow steady gait.  Stairs            Wheelchair Mobility    Modified Rankin (Stroke Patients Only)       Balance Overall balance assessment: Needs assistance Sitting-balance support: Feet supported Sitting balance-Leahy Scale: Good     Standing balance support: Bilateral upper extremity supported;During functional activity Standing balance-Leahy Scale: Fair Standing balance comment: reliant on B UE support at this time due to pain with mobility                             Pertinent Vitals/Pain Pain Assessment: Faces Faces Pain Scale: Hurts even more Pain Location: abdomen, B Shoulders Pain Descriptors / Indicators: Discomfort;Sore;Grimacing;Guarding;Moaning Pain Intervention(s): Limited activity within patient's tolerance;Monitored during session    Home Living Family/patient expects to be discharged to:: Private residence Living Arrangements: Spouse/significant other Available Help at Discharge: Available 24 hours/day;Available PRN/intermittently Type of Home: House Home Access: Ramped entrance     Home Layout: One level Home Equipment: None Additional Comments: states she lives with her husband

## 2020-03-02 LAB — GLUCOSE, CAPILLARY
Glucose-Capillary: 137 mg/dL — ABNORMAL HIGH (ref 70–99)
Glucose-Capillary: 129 mg/dL — ABNORMAL HIGH (ref 70–99)
Glucose-Capillary: 120 mg/dL — ABNORMAL HIGH (ref 70–99)
Glucose-Capillary: 140 mg/dL — ABNORMAL HIGH (ref 70–99)

## 2020-03-02 MED ORDER — DILTIAZEM HCL ER COATED BEADS 240 MG PO CP24
240.0000 mg | ORAL_CAPSULE | Freq: Every day | ORAL | Status: DC
  Administered 2020-03-02 – 2020-03-04 (×3): 240 mg via ORAL
  Filled 2020-03-02 (×3): qty 1

## 2020-03-02 NOTE — Progress Notes (Signed)
Patient ambulated with walker and stand by assist around the whole unit this morning.

## 2020-03-02 NOTE — TOC Initial Note (Signed)
Transition of Care [facility]) - Initial/Assessment Note    Patient Details  Name: Bridget Grant MRN: [id number] Date of Birth: 1945/08/08  [facility] CM/SW Contact:    Alexander Mt, LCSW Phone Number: 03/02/2020, 4:22 PM  Clinical Narrative:                 CSW spoke with pt at bedside. Introduced self, role, reason for visit. Pt from home with family, confirmed home address and PCP. She would like to make her own PCP f/u appointment. We discussed recommendations- she would prefer to go home and functionally doesn't feel she needs SNF. PT to work with her this afternoon and TOC team to f/u, CSW provided pt with CMS list of [facility] agencies. Pt states she likely will need walker, we will order prior to d/c.   Barriers to Discharge: Continued Medical Work up   Patient Goals and CMS Choice Patient states their goals for this hospitalization and ongoing recovery are:: get stronger, go home CMS Medicare.gov Compare Post Acute Care list provided to:: Patient Choice offered to / list presented to : Patient  Expected Discharge Plan and Services In-house Referral: Clinical Social Work Discharge Planning Services: CM Consult Post Acute Care Choice: [place] arrangements for the past 2 months: Single Family Home   Prior Living Arrangements/Services Living arrangements for the past 2 months: Single Family Home Lives with:: Spouse Patient language and need for interpreter reviewed:: Yes (no needs) Do you feel safe going back to the place where you live?: Yes      Need for Family Participation in Patient Care: Yes (Comment) (assistance as needed) Care giver support system in place?: Yes (comment) (family support)   Criminal Activity/Legal Involvement Pertinent to Current Situation/Hospitalization: No - Comment as needed  Activities of Daily Living Home Assistive Devices/Equipment: CBG Meter ADL Screening (condition at time of admission) Patient's cognitive ability  adequate to safely complete daily activities?: Yes Is the patient deaf or have difficulty hearing?: No Does the patient have difficulty seeing, even when wearing glasses/contacts?: No Does the patient have difficulty concentrating, remembering, or making decisions?: No Patient able to express need for assistance with ADLs?: Yes Does the patient have difficulty dressing or bathing?: No Independently performs ADLs?: Yes (appropriate for developmental age) Does the patient have difficulty walking or climbing stairs?: Yes Weakness of Legs: Both Weakness of Arms/Hands: None  Permission Sought/Granted Permission sought to share information with : Family Supports Permission granted to share information with : Yes, Verbal Permission Granted  Share Information with NAME: family members on facesheet    Emotional Assessment Appearance:: Appears stated age Attitude/Demeanor/Rapport: Engaged, Gracious Affect (typically observed): Accepting, Adaptable, Appropriate Orientation: : Oriented to Place, Oriented to  Time, Oriented to Situation, Oriented to Self Alcohol / Substance Use: Not Applicable Psych Involvement:  (n/a)  Admission diagnosis:  Gastrointestinal stromal tumor (GIST) ([place]) [C49.A0] Patient Active Problem List   Diagnosis Date Noted  . Gastrointestinal stromal tumor (GIST) ([place]) 02/29/2020  . Iron deficiency anemia due to chronic blood loss 01/23/2020  . Malignant gastrointestinal stromal tumor (GIST) of stomach ([place]) 01/23/2020  . Gastric mass 01/14/2020  . Upper GI bleed 01/13/2020  . Acute blood loss anemia 01/13/2020  . Hematemesis without nausea 01/11/2020  . Melena 01/11/2020  . Vitamin D deficiency 11/16/2019  . Vitamin B12 deficiency 11/16/2019  . Lipoma 04/14/2015  . Osteoarthritis of left knee 04/01/2014  . Left knee pain 03/22/2014  . Venous (peripheral) insufficiency 03/22/2014  .  Diabetes ([place]) 04/09/2013  . Migraine   . Preventative health care 05/28/2011  .  MENOPAUSAL DISORDER 12/01/2009  . LEG PAIN, LEFT 02/03/2008  . TREMOR 02/03/2008  . Hyperlipidemia 05/27/2007  . ANXIETY 05/27/2007  . Depression 05/27/2007  . POSITIONAL VERTIGO 05/27/2007  . Essential hypertension 05/27/2007  . OSTEOPENIA 05/27/2007   PCP:  [name], MD Pharmacy:   [facility] [place], [place] - [address] [place] [address] Phone: [phone] Fax: [phone]  [facility] [place], [place] - [address] [place] [address] Phone: [phone] Fax: [phone]  [place] [address] [place] [address] Phone: [phone] Fax: [phone]  Readmission Risk Interventions Readmission Risk Prevention Plan 03/02/2020  Post Dischage Appt Patient refused  Appt Comments pt will schedule her own  Medication Screening Complete  Transportation Screening Complete  Some recent data might be hidden

## 2020-03-02 NOTE — Progress Notes (Signed)
2 Days Post-Op  Robotic partial gastrectomy  Subjective: Tolerating clears, no BM yet, two episodes of nausea, now resolved, no emesis. Evaluated by PT, recommending SNF. Walker at bedside. Voided after foley removal. Few episodes of tachycardia.  Objective: Vital signs in last 24 hours: Temp:  [97.6 F (36.4 C)-98.7 F (37.1 C)] 97.8 F (36.6 C) (09/23 0441) Pulse Rate:  [98-107] 107 (09/23 0441) Resp:  [18] 18 (09/23 0441) BP: (134-149)/(60-70) 149/66 (09/23 0441) SpO2:  [84 %-90 %] 84 % (09/23 0441)    Intake/Output from previous day: 09/22 0701 - 09/23 0700 In: -  Out: 1300 [Urine:1300] Intake/Output this shift: No intake/output data recorded.  Physical exam:  Gen: In no acute distress Heart: RRR Lungs: CTAB Abd: Soft, appropriately tender to palpation, mildly distended, incision c/d/i Ext: No edema  Lab Results:  BMET Recent Labs    02/29/20 1831 03/01/20 0510  NA  --  137  K  --  4.4  CL  --  103  CO2  --  20*  GLUCOSE  --  237*  BUN  --  12  CREATININE 0.90 1.04*  CALCIUM  --  8.6*   PT/INR No results for input(s): LABPROT, INR in the last 72 hours. ABG No results for input(s): PHART, HCO3 in the last 72 hours.  Invalid input(s): PCO2, PO2  Studies/Results: DG Chest 1 View  Result Date: 02/29/2020 CLINICAL DATA:  Central line placement EXAM: CHEST  1 VIEW COMPARISON:  02/04/2005 FINDINGS: Single frontal view of the chest demonstrates right internal jugular catheter tip overlying superior vena cava. Cardiac silhouette is enlarged. Patchy retrocardiac consolidation may reflect atelectasis. No effusion or pneumothorax. No acute bony abnormalities. IMPRESSION: 1. No complication after right internal jugular catheter placement. 2. Retrocardiac consolidation favor atelectasis. Electronically Signed   By: Randa Ngo M.D.   On: 02/29/2020 17:38    Anti-infectives: Anti-infectives (From admission, onward)   Start     Dose/Rate Route Frequency Ordered  Stop   02/29/20 1030  ceFAZolin (ANCEF) IVPB 2g/100 mL premix        2 g 200 mL/hr over 30 Minutes Intravenous On call to O.R. 02/29/20 1025 02/29/20 1400   02/29/20 1029  ceFAZolin (ANCEF) 2-4 GM/100ML-% IVPB       Note to Pharmacy: Odelia Gage   : cabinet override      02/29/20 1029 02/29/20 1413      Assessment/Plan: 3F with PMH of HTN, DM s/p Procedure(s): ROBOTIC PARTIAL GASTRECTOMY   LOS: 2 days   -full liquid diet -Zofran PRN for nausea -continue resistant ISS, will restart home regimen once tolerating fulls -restarted home Diltiazem today -Pain: Oxy PRN and Dilaudid IV for breakthrough -DVT ppx: SQH -PT consulted, recommending SNF, walker at bedside -Encourage IS and ambulation  Daiva Huge, MD 03/02/2020

## 2020-03-02 NOTE — Progress Notes (Signed)
Physical Therapy Treatment Patient Details Name: Bridget Grant MRN: [id number] DOB: 03/12/1946 Today's Date: 03/02/2020    History of Present Illness The patient is a 74 year old female who presents with upper gastrointestinal bleeding. Pt is a 74 yo F referred by our inpatient team for a bleeding gastric mass.  She presented to the hospital with hematemesis and melanoma 01/13/2020.  She got 3 units of pRBCs and an upper endoscopy.  She was seen to have a friable proximal gastric mass consistent with malignancy.  The patient had staging scans that also showed a left breast mass that was 2.1 cm. S/P partial gastrectomy.    PT Comments    Pt in less pain this session and functioning well with use of RW.  Based on progress will update recommendations to HHPT at this time.  Will inform supervising PT of need for change in recommendations.  Plan to issue HEP next session.     Follow Up Recommendations  Home health PT     Equipment Recommendations  Rolling walker with 5" wheels    Recommendations for Other Services       Precautions / Restrictions Precautions Precaution Comments: mod fall    Mobility  Bed Mobility   Bed Mobility: Sit to Supine       Sit to supine: Supervision   General bed mobility comments: Pt seated on edge of bed on arrival, reports she moved to edge of bed unassisted.  Pt required no assistance to return back to bed.  Transfers Overall transfer level: Needs assistance Equipment used: Rolling walker (2 wheeled) Transfers: Sit to/from Stand           General transfer comment: Cues for hand placement to improve safety to and from seated surface.  Pt reaching for rolling walker to pull into standing.  Ambulation/Gait Ambulation/Gait assistance: Supervision Gait Distance (Feet): 250 Feet Assistive device: Rolling walker (2 wheeled) Gait Pattern/deviations: Decreased stride length;Trunk flexed;Step-through pattern Gait velocity: decr   General Gait  Details: Pt required cues for upper trunk control and RW safety.  Utilized RW for balance vs. support.   Stairs             Wheelchair Mobility    Modified Rankin (Stroke Patients Only)       Balance Overall balance assessment: Needs assistance Sitting-balance support: Feet supported Sitting balance-Leahy Scale: Good       Standing balance-Leahy Scale: Fair Standing balance comment: reliant on B UE support at this time due to pain with mobility                            Cognition Arousal/Alertness: Awake/alert Behavior During Therapy: WFL for tasks assessed/performed Overall Cognitive Status: Within Functional Limits for tasks assessed                                        Exercises      General Comments        Pertinent Vitals/Pain Pain Assessment: Faces Faces Pain Scale: Hurts little more Pain Location: abdomen, B Shoulders Pain Descriptors / Indicators: Discomfort;Sore Pain Intervention(s): Monitored during session;Repositioned    Home Living                      Prior Function            PT Goals (current  goals can now be found in the care plan section) Acute Rehab PT Goals Patient Stated Goal: to return home, decrease pain Potential to Achieve Goals: Good Progress towards PT goals: Progressing toward goals    Frequency           PT Plan Discharge plan needs to be updated    Co-evaluation              AM-PAC PT "6 Clicks" Mobility   Outcome Measure  Help needed turning from your back to your side while in a flat bed without using bedrails?: None Help needed moving from lying on your back to sitting on the side of a flat bed without using bedrails?: None Help needed moving to and from a bed to a chair (including a wheelchair)?: A Little Help needed standing up from a chair using your arms (e.g., wheelchair or bedside chair)?: A Little Help needed to walk in hospital room?: None Help needed  climbing 3-5 steps with a railing? : A Little 6 Click Score: 21    End of Session Equipment Utilized During Treatment: Gait belt Activity Tolerance: Patient tolerated treatment well Patient left: in bed;with bed alarm set;with call bell/phone within reach Nurse Communication: Mobility status PT Visit Diagnosis: Muscle weakness (generalized) (M62.81);Difficulty in walking, not elsewhere classified (R26.2);Pain Pain - part of body:  (abdomen)     Time: 0981-1914 PT Time Calculation (min) (ACUTE ONLY): 17 min  Charges:  $Gait Training: 8-22 mins                     Bonney Leitz , PTA Acute Rehabilitation Services Pager [phone] Office [phone]     Darshan Solanki Artis Delay 03/02/2020, 5:13 PM

## 2020-03-03 LAB — GLUCOSE, CAPILLARY
Glucose-Capillary: 121 mg/dL — ABNORMAL HIGH (ref 70–99)
Glucose-Capillary: 153 mg/dL — ABNORMAL HIGH (ref 70–99)
Glucose-Capillary: 98 mg/dL (ref 70–99)
Glucose-Capillary: 121 mg/dL — ABNORMAL HIGH (ref 70–99)

## 2020-03-03 LAB — BASIC METABOLIC PANEL
Anion gap: 10 (ref 5–15)
BUN: 14 mg/dL (ref 8–23)
CO2: 28 mmol/L (ref 22–32)
Calcium: 8.4 mg/dL — ABNORMAL LOW (ref 8.9–10.3)
Chloride: 100 mmol/L (ref 98–111)
Creatinine, Ser: 0.9 mg/dL (ref 0.44–1.00)
GFR calc Af Amer: >60 mL/min (ref >60)
GFR calc non Af Amer: >60 mL/min (ref >60)
Glucose, Bld: 142 mg/dL — ABNORMAL HIGH (ref 70–99)
Potassium: 4.1 mmol/L (ref 3.5–5.1)
Sodium: 138 mmol/L (ref 135–145)

## 2020-03-03 LAB — CBC
HCT: 19.3 % — ABNORMAL LOW (ref 36.0–46.0)
Hemoglobin: 6.1 g/dL — CL (ref 12.0–15.0)
MCH: 32.4 pg (ref 26.0–34.0)
MCHC: 31.6 g/dL (ref 30.0–36.0)
MCV: 102.7 fL — ABNORMAL HIGH (ref 80.0–100.0)
Platelets: 208 10*3/uL (ref 150–400)
RBC: 1.88 MIL/uL — ABNORMAL LOW (ref 3.87–5.11)
RDW: 15.2 % (ref 11.5–15.5)
WBC: 10.2 10*3/uL (ref 4.0–10.5)
nRBC: 0.2 % (ref 0.0–0.2)

## 2020-03-03 LAB — HEMOGLOBIN AND HEMATOCRIT, BLOOD
HCT: 26.7 % — ABNORMAL LOW (ref 36.0–46.0)
Hemoglobin: 9 g/dL — ABNORMAL LOW (ref 12.0–15.0)

## 2020-03-03 LAB — PREPARE RBC (CROSSMATCH)

## 2020-03-03 MED ORDER — SODIUM CHLORIDE 0.9% IV SOLUTION: Freq: Once | INTRAVENOUS | Status: DC

## 2020-03-03 MED ORDER — ACETAMINOPHEN 325 MG PO TABS
650.0000 mg | ORAL_TABLET | Freq: Four times a day (QID) | ORAL | Status: DC | PRN
  Administered 2020-03-03 (×2): 650 mg via ORAL
  Filled 2020-03-03 (×2): qty 2

## 2020-03-03 NOTE — TOC Progression Note (Signed)
Transition of Care [facility]) - Progression Note    Patient Details  Name: NEGIN HEGG MRN: [id number] Date of Birth: 01/14/1946  Transition of Care [facility]) CM/SW [place], [place] Phone Number: 03/03/2020, 2:28 PM  Clinical Narrative:    CSW spoke with pt via room phone to check in on her and to see if she has any additional questions/preferences for [facility] agency. Introduced self, role, reason for call. Pt agreeable still to rolling walker and is interested in [facility] PT. She is okay with this Probation officer making referral to [facility] for services. She prefers a female PT, referral given to [place] with [facility] along with request. Pt will need HH PT order, face to face and DME order for rolling walker for [facility] team to complete referral.    Expected Discharge Plan: [place] Barriers to Discharge: Continued Medical Work up  Expected Discharge Plan and Services Expected Discharge Plan: [place] In-house Referral: Clinical Social Work Discharge Planning Services: CM Consult Post Acute Care Choice: Durable Medical Equipment, Home Health Living arrangements for the past 2 months: Single Family Home           DME Arranged: Walker rolling DME Agency: [employer] HH Arranged: PT HH Agency: [place] Date [place]: 03/03/20 Time [place]: 1428 Representative spoke with at [place]: Georgina Snell   Readmission Risk Interventions Readmission Risk Prevention Plan 03/02/2020  [place] Appt Patient refused  Appt Comments pt will schedule her own  Medication Screening Complete  Transportation Screening Complete  Some recent data might be hidden

## 2020-03-03 NOTE — Progress Notes (Signed)
2 units prbcs transfused this morning

## 2020-03-03 NOTE — Progress Notes (Signed)
Pt ambulated a full lap on unit. Also tolerating po fluids so no need to continue iv fluids

## 2020-03-03 NOTE — Care Management Important Message (Signed)
Important Message  Patient Details  Name: Bridget Grant MRN: [id number] Date of Birth: May 10, 1946   Medicare Important Message Given:  Yes     Orbie Pyo 03/03/2020, 2:30 PM

## 2020-03-03 NOTE — Progress Notes (Signed)
3 Days Post-Op  Robotic partial gastrectomy  Subjective: Tolerating fulls, passing flatus, no BM yet. H/H drop from 8.8 to 6.1, 2 units of PRBCs ordered overnight. She subjectively feels better and is happy to hear that she does not require SNF placement.   Objective: Vital signs in last 24 hours: Temp:  [99 F (37.2 C)-100 F (37.8 C)] 99.8 F (37.7 C) (09/24 0505) Pulse Rate:  [87-98] 87 (09/24 0505) Resp:  [16-20] 16 (09/24 0505) BP: (124-136)/(49-57) 126/51 (09/24 0505) SpO2:  [83 %-94 %] 94 % (09/24 0505)    Intake/Output from previous day: 09/23 0701 - 09/24 0700 In: 700 [P.O.:700] Out: 1300 [Urine:1300] Intake/Output this shift: No intake/output data recorded.  Physical exam:  Gen: In no acute distress Heart: RRR Lungs: CTAB Abd: Soft, appropriately tender to palpation, non-distended, incision c/d/i Ext: No edema  Lab Results:  BMET Recent Labs    03/01/20 0510 03/03/20 0110  NA 137 138  K 4.4 4.1  CL 103 100  CO2 20* 28  GLUCOSE 237* 142*  BUN 12 14  CREATININE 1.04* 0.90  CALCIUM 8.6* 8.4*   PT/INR No results for input(s): LABPROT, INR in the last 72 hours. ABG No results for input(s): PHART, HCO3 in the last 72 hours.  Invalid input(s): PCO2, PO2  Studies/Results: No results found.  Anti-infectives: Anti-infectives (From admission, onward)   Start     Dose/Rate Route Frequency Ordered Stop   02/29/20 1030  ceFAZolin (ANCEF) IVPB 2g/100 mL premix        2 g 200 mL/hr over 30 Minutes Intravenous On call to O.R. 02/29/20 1025 02/29/20 1400   02/29/20 1029  ceFAZolin (ANCEF) 2-4 GM/100ML-% IVPB       Note to Pharmacy: Odelia Gage   : cabinet override      02/29/20 1029 02/29/20 1413      Assessment/Plan: 11F with PMH of HTN, DM s/p Procedure(s): ROBOTIC PARTIAL GASTRECTOMY   LOS: 3 days   -repeat CBC after transfusion is completed it, will hold SQH for now  -continue full liquid diet -Zofran PRN for nausea -continue resistant  ISS, will restart metformin today -continue home Diltiazem  -Pain: Oxy PRN and Dilaudid IV for breakthrough -DVT ppx: SQH (holding) -PT consulted, recommending home health PT, walker at bedside -Encourage IS and ambulation  Daiva Huge, MD 03/03/2020

## 2020-03-04 LAB — BPAM RBC
Blood Product Expiration Date: 202110232359
Blood Product Expiration Date: 202110252359
ISSUE DATE / TIME: 202109240434
ISSUE DATE / TIME: 202109241045
Unit Type and Rh: 5100
Unit Type and Rh: 5100

## 2020-03-04 LAB — TYPE AND SCREEN
ABO/RH(D): O POS
Antibody Screen: NEGATIVE
Unit division: 0
Unit division: 0

## 2020-03-04 LAB — BASIC METABOLIC PANEL
Anion gap: 8 (ref 5–15)
BUN: 11 mg/dL (ref 8–23)
CO2: 27 mmol/L (ref 22–32)
Calcium: 8.4 mg/dL — ABNORMAL LOW (ref 8.9–10.3)
Chloride: 103 mmol/L (ref 98–111)
Creatinine, Ser: 0.79 mg/dL (ref 0.44–1.00)
GFR calc Af Amer: >60 mL/min (ref >60)
GFR calc non Af Amer: >60 mL/min (ref >60)
Glucose, Bld: 125 mg/dL — ABNORMAL HIGH (ref 70–99)
Potassium: 3.9 mmol/L (ref 3.5–5.1)
Sodium: 138 mmol/L (ref 135–145)

## 2020-03-04 LAB — CBC
HCT: 25.6 % — ABNORMAL LOW (ref 36.0–46.0)
Hemoglobin: 8.3 g/dL — ABNORMAL LOW (ref 12.0–15.0)
MCH: 31.6 pg (ref 26.0–34.0)
MCHC: 32.4 g/dL (ref 30.0–36.0)
MCV: 97.3 fL (ref 80.0–100.0)
Platelets: 197 10*3/uL (ref 150–400)
RBC: 2.63 MIL/uL — ABNORMAL LOW (ref 3.87–5.11)
RDW: 16.9 % — ABNORMAL HIGH (ref 11.5–15.5)
WBC: 8.1 10*3/uL (ref 4.0–10.5)
nRBC: 0.6 % — ABNORMAL HIGH (ref 0.0–0.2)

## 2020-03-04 LAB — GLUCOSE, CAPILLARY
Glucose-Capillary: 120 mg/dL — ABNORMAL HIGH (ref 70–99)
Glucose-Capillary: 135 mg/dL — ABNORMAL HIGH (ref 70–99)

## 2020-03-04 MED ORDER — INFLUENZA VAC A&B SA ADJ QUAD 0.5 ML IM PRSY
0.5000 mL | PREFILLED_SYRINGE | Freq: Once | INTRAMUSCULAR | Status: AC
  Administered 2020-03-04: 0.5 mL via INTRAMUSCULAR
  Filled 2020-03-04 (×2): qty 0.5

## 2020-03-04 MED ORDER — OXYCODONE HCL 5 MG PO TABS
5.0000 mg | ORAL_TABLET | ORAL | 0 refills | Status: DC | PRN
Start: 2020-03-04 — End: 2020-08-07

## 2020-03-04 NOTE — Discharge Instructions (Signed)
Soft-Food Eating Plan A soft-food eating plan includes foods that are safe and easy to chew and swallow. Your health care provider or dietitian can help you find foods and flavors that fit into this plan. Follow this plan until your health care provider or dietitian says it is safe to start eating other foods and food textures. What are tips for following this plan? General guidelines   Take small bites of food, or cut food into pieces about  inch or smaller. Bite-sized pieces of food are easier to chew and swallow.  Eat moist foods. Avoid overly dry foods.  Avoid foods that: ? Are difficult to swallow, such as dry, chunky, crispy, or sticky foods. ? Are difficult to chew, such as hard, tough, or stringy foods. ? Contain nuts, seeds, or fruits.  Follow instructions from your dietitian about the types of liquids that are safe for you to swallow. You may be allowed to have: ? Thick liquids only. This includes only liquids that are thicker than honey. ? Thin and thick liquids. This includes all beverages and foods that become liquid at room temperature.  To make thick liquids: ? Purchase a commercial liquid thickening powder. These are available at grocery stores and pharmacies. ? Mix the thickener into liquids according to instructions on the label. ? Purchase ready-made thickened liquids. ? Thicken soup by pureeing, straining to remove chunks, and adding flour, potato flakes, or corn starch. ? Add commercial thickener to foods that become liquid at room temperature, such as milk shakes, yogurt, ice cream, gelatin, and sherbet.  Ask your health care provider whether you need to take a fiber supplement. Cooking  Cook meats so they stay tender and moist. Use methods like braising, stewing, or baking in liquid.  Cook vegetables and fruit until they are soft enough to be mashed with a fork.  Peel soft, fresh fruits such as peaches, nectarines, and melons.  When making soup, make sure  chunks of meat and vegetables are smaller than  inch.  Reheat leftover foods slowly so that a tough crust does not form. What foods are allowed? The items listed below may not be a complete list. Talk with your dietitian about what dietary choices are best for you. Grains Breads, muffins, pancakes, or waffles moistened with syrup, jelly, or butter. Dry cereals well-moistened with milk. Moist, cooked cereals. Well-cooked pasta and rice. Vegetables All soft-cooked vegetables. Shredded lettuce. Fruits All canned and cooked fruits. Soft, peeled fresh fruits. Strawberries. Dairy Milk. Cream. Yogurt. Cottage cheese. Soft cheese without the rind. Meats and other protein foods Tender, moist ground meat, poultry, or fish. Meat cooked in gravy or sauces. Eggs. Sweets and desserts Ice cream. Milk shakes. Sherbet. Pudding. Fats and oils Butter. Margarine. Olive, canola, sunflower, and grapeseed oil. Smooth salad dressing. Smooth cream cheese. Mayonnaise. Gravy. What foods are not allowed? The items listed bemay not be a complete list. Talk with your dietitian about what dietary choices are best for you. Grains Coarse or dry cereals, such as bran, granola, and shredded wheat. Tough or chewy crusty breads, such as French bread or baguettes. Breads with nuts, seeds, or fruit. Vegetables All raw vegetables. Cooked corn. Cooked vegetables that are tough or stringy. Tough, crisp, fried potatoes and potato skins. Fruits Fresh fruits with skins or seeds, or both, such as apples, pears, and grapes. Stringy, high-pulp fruits, such as papaya, pineapple, coconut, and mango. Fruit leather and all dried fruit. Dairy Yogurt with nuts or coconut. Meats and other protein foods Hard,   dry sausages. Dry meat, poultry, or fish. Meats with gristle. Fish with bones. Fried meat or fish. Lunch meat and hotdogs. Nuts and seeds. Chunky peanut butter or other nut butters. Sweets and desserts Cakes or cookies that are very  dry or chewy. Desserts with dried fruit, nuts, or coconut. Fried pastries. Very rich pastries. Fats and oils Cream cheese with fruit or nuts. Salad dressings with seeds or chunks. Summary  A soft-food eating plan includes foods that are safe and easy to swallow. Generally, the foods should be soft enough to be mashed with a fork.  Avoid foods that are dry, hard to chew, crunchy, sticky, stringy, or crispy.  Ask your health care provider whether you need to thicken your liquids and if you need to take a fiber supplement. This information is not intended to replace advice given to you by your health care provider. Make sure you discuss any questions you have with your health care provider. Document Revised: 09/17/2018 Document Reviewed: 07/30/2016 Elsevier Patient Education  2020 [facility], [place] [phone]  ABDOMINAL SURGERY: POST OP INSTRUCTIONS  Always review your discharge instruction sheet given to you by the facility where your surgery was performed.  IF YOU HAVE DISABILITY OR FAMILY LEAVE FORMS, YOU MUST BRING THEM TO THE OFFICE FOR PROCESSING.  PLEASE DO NOT GIVE THEM TO YOUR DOCTOR.  1. A prescription for pain medication may be given to you upon discharge.  Take your pain medication as prescribed, if needed.  If narcotic pain medicine is not needed, then you may take acetaminophen (Tylenol) or ibuprofen (Advil) as needed. 2. Take your usually prescribed medications unless otherwise directed. 3. If you need a refill on your pain medication, please contact your pharmacy. They will contact our office to request authorization.  Prescriptions will not be filled after 5pm or on week-ends. 4. You should follow a light diet the first few days after arrival home, such as soup and crackers, pudding, etc.unless your doctor has advised otherwise. A high-fiber, low fat diet can be resumed as tolerated.   Be sure to include lots of fluids daily. Most patients  will experience some swelling and bruising on the chest and neck area.  Ice packs will help.  Swelling and bruising can take several days to resolve 5. Most patients will experience some swelling and bruising in the area of the incision. Ice pack will help. Swelling and bruising can take several days to resolve..  6. It is common to experience some constipation if taking pain medication after surgery.  Increasing fluid intake and taking a stool softener will usually help or prevent this problem from occurring.  A mild laxative (Milk of Magnesia or Miralax) should be taken according to package directions if there are no bowel movements after 48 hours. 7.  You may have steri-strips (small skin tapes) in place directly over the incision.  These strips should be left on the skin for 10-14 days.  If your surgeon used skin glue on the incision, you may shower in 48 hours.  The glue will flake off over the next 2-3 weeks.  Any sutures or staples will be removed at the office during your follow-up visit. You may find that a light gauze bandage over your incision may keep your staples from being rubbed or pulled. You may shower and replace the bandage daily. 8. ACTIVITIES:  You may resume regular (light) daily activities beginning the next day--such as daily self-care,  walking, climbing stairs--gradually increasing activities as tolerated.  You may have sexual intercourse when it is comfortable.  Refrain from any heavy lifting or straining until approved by your doctor. a. You may drive when you no longer are taking prescription pain medication, you can comfortably wear a seatbelt, and you can safely maneuver your car and apply brakes b. Return to Work: __________2 weeks if applicable_________________________ 9. You should see your doctor in the office for a follow-up appointment approximately two weeks after your surgery.  Make sure that you call for this appointment within a day or two after you arrive home to  insure a convenient appointment time. OTHER INSTRUCTIONS:  _____________________________________________________________ _____________________________________________________________  WHEN TO CALL YOUR DOCTOR: 1. Fever over 101.0 2. Inability to urinate 3. Nausea and/or vomiting 4. Extreme swelling or bruising 5. Continued bleeding from incision. 6. Increased pain, redness, or drainage from the incision. 7. Difficulty swallowing or breathing 8. Muscle cramping or spasms. 9. Numbness or tingling in hands or feet or around lips.  The clinic staff is available to answer your questions during regular business hours.  Please don't hesitate to call and ask to speak to one of the nurses if you have concerns.  For further questions, please visit [email or web address]

## 2020-03-04 NOTE — Discharge Summary (Signed)
Physician Discharge Summary  Patient ID: Bridget Grant MRN: [id number] DOB/AGE: 1945/06/25 74 y.o.  Admit date: 02/29/2020 Discharge date: 03/04/2020  Admission Diagnoses: GIST Anxiety and depression HTN Chronic blood loss anemia  Discharge Diagnoses:  Active Problems:   Gastrointestinal stromal tumor (GIST) ([place]) acute blood loss anemia on chronic blood loss anemia and same as above  Discharged Condition: stable  Hospital Course:  Pt was admitted to the floor following a robotic partial gastrectomy 02/29/2020.  She had some nausea and shoulder pain on POD 1.  She was allowed clears. She voided after foley removal.   On POD 2, nausea was improved and she had full liquids.  She was on sliding scale insulin while in house.  Her antihypertensives were started on POD 2.  PT initially recommended SNF, but she progressed enough for [facility] PT.  She dropped her HCT on POD 3 and received blood.  She was able to transition to a soft diet.  She was discharged in stable condition at that point.    Consults: None  Significant Diagnostic Studies: labs: HCT 25.6 pre discharge.  Also, Cr 0.79  Treatments: surgery: see above and transfusion  Discharge Exam: Blood pressure (!) 145/58, pulse 72, temperature 98 F (36.7 C), temperature source Oral, resp. rate 18, height 5\' 2"  (1.575 m), weight 93.9 kg, SpO2 94 %. General appearance: alert, cooperative and no distress Resp: breathing comfortably GI: soft, non distended, incisions c/d/i. Extremities: extremities normal, atraumatic, no cyanosis or edema  Disposition: Discharge disposition: 01-Home or Self Care       Discharge Instructions    Call MD for:  difficulty breathing, headache or visual disturbances   Complete by: As directed    Call MD for:  hives   Complete by: As directed    Call MD for:  persistant nausea and vomiting   Complete by: As directed    Call MD for:  redness, tenderness, or signs of infection (pain, swelling, redness,  odor or green/yellow discharge around incision site)   Complete by: As directed    Call MD for:  severe uncontrolled pain   Complete by: As directed    Call MD for:  temperature >100.4   Complete by: As directed    Discharge diet:   Complete by: As directed    Soft diet/full liquids   Increase activity slowly   Complete by: As directed      Allergies as of 03/04/2020   No Known Allergies     Medication List    STOP taking these medications   hydrochlorothiazide 12.5 MG capsule Commonly known as: Microzide   vitamin B-12 1000 MCG tablet Commonly known as: CYANOCOBALAMIN   Vitamin D (Ergocalciferol) 1.25 MG (50000 UNIT) Caps capsule Commonly known as: DRISDOL     TAKE these medications   atorvastatin 20 MG tablet Commonly known as: LIPITOR TAKE 1 TABLET BY MOUTH EVERY DAY   diltiazem 240 MG 24 hr capsule Commonly known as: CARDIZEM CD TAKE 1 CAPSULE(240 MG) BY MOUTH DAILY What changed: See the new instructions.   glipiZIDE 10 MG 24 hr tablet Commonly known as: GLUCOTROL XL TAKE 1 TABLET BY MOUTH DAILY WITH BREAKFAST   imatinib 400 MG tablet Commonly known as: GLEEVEC Take 1 tablet (400 mg total) by mouth daily. Take with meals and large glass of water.Caution:Chemotherapy.   lisinopril 20 MG tablet Commonly known as: ZESTRIL TAKE 1 TABLET BY MOUTH EVERY DAY What changed: when to take this   metFORMIN 500 MG 24  hr tablet Commonly known as: GLUCOPHAGE-XR TAKE 4 TABLETS(2000 MG) BY MOUTH DAILY WITH BREAKFAST What changed: See the new instructions.   oxyCODONE 5 MG immediate release tablet Commonly known as: Oxy IR/ROXICODONE Take 1-2 tablets (5-10 mg total) by mouth every 4 (four) hours as needed for moderate pain.   pantoprazole 40 MG tablet Commonly known as: PROTONIX Take 1 tablet (40 mg total) by mouth 2 (two) times daily before a meal.   pioglitazone 15 MG tablet Commonly known as: ACTOS Take 1 tablet (15 mg total) by mouth daily.       Follow-up  Information    Stark Klein, MD Follow up in 2 week(s).   Specialty: General Surgery Contact information: [address] [place] [address] [phone]               Signed: Stark Klein 03/04/2020, 8:32 AM

## 2020-03-04 NOTE — Progress Notes (Signed)
[name] to be D/C'd  per MD order. Discussed with the patient and all questions fully answered. An After Visit Summary was printed and given to the patient.   D/c education completed with patient/family including follow up instructions, medication list, d/c activities limitations if indicated, with other d/c instructions as indicated by MD - patient able to verbalize understanding, all questions fully answered.   Patient to be escorted via [place], and D/C home via private auto.

## 2020-03-04 NOTE — Plan of Care (Signed)

## 2020-03-07 ENCOUNTER — Other Ambulatory Visit: Payer: Self-pay | Admitting: *Deleted

## 2020-03-07 NOTE — Patient Outreach (Addendum)
[facility]) Care Management  03/07/2020  MARGEL JOENS [birth year] [id number]   Subjective: Telephone call to patient's home  number, spoke with patient, and HIPAA verified.  Discussed [facility] Care Management Medicare EMMI General Discharge Red Flag Alert follow up, patient voiced understanding, and is in agreement to a brief follow up.  States she remembers receiving EMMI automated calls and has the following appointments: on 03/15/2020 with oncologist, on 03/20/2020 with surgeon.  Per [place] Link/ Epic appointment desk, patient has a follow up appointment with primary MD on 05/17/2020.   Discussed importance of hospital follow up with primary MD, patient voices understanding, and states she is planning to call MD for sooner appointment.  Patient states she is doing fine and has everything that she needs, does not need any additional services.  Patient states she is able to manage self care and has assistance as needed. Patient states she does not have any education material, EMMI follow up, care coordination, care management, disease monitoring, transportation, community resource, or pharmacy needs at this time.  States she is very appreciative of the follow up, is in agreement  to receive [place] Management information, and will call if services needed in the future.       Objective: Per KPN (Knowledge Performance Now, point of care tool) and chart review, patient hospitalized 02/29/2020 - 03/04/2020 for  Gastrointestinal stromal tumor (GIST), status post Robotic partial gastrectomy on 02/29/2020.   Patient also has a history of diabetes, anxiety, hypertension,  and chronic blood loss.       Assessment: Received Medicare EMMI General Discharge Red Alert Flag, Day #1, patient answered no to the following question: Scheduled follow-up?   EMMI follow up completed and no further care management needs.      Plan: RNCM will send request to [facility] [place] Management to  discontinue EMMI automated calls, per patient's request.  RNCM will send patient successful outreach letter, [facility] pamphlet, and magnet. RNCM will send MD case closure letter.  RNCM will complete case closure due to follow up completed / no care management needs.       Doristine Shehan H. Annia Friendly, BSN, [place] Management [facility] Telephonic CM Phone: [phone] Fax: [phone]

## 2020-03-08 LAB — SURGICAL PATHOLOGY

## 2020-03-09 ENCOUNTER — Other Ambulatory Visit: Payer: Self-pay | Admitting: Internal Medicine

## 2020-03-09 NOTE — Telephone Encounter (Signed)
Please refill as per office routine med refill policy (all routine meds refilled for 3 mo or monthly per pt preference up to one year from last visit, then month to month grace period for 3 mo, then further med refills will have to be denied)  

## 2020-03-10 ENCOUNTER — Other Ambulatory Visit: Payer: Self-pay | Admitting: Internal Medicine

## 2020-03-10 ENCOUNTER — Other Ambulatory Visit: Payer: Self-pay

## 2020-03-10 MED ORDER — PANTOPRAZOLE SODIUM 40 MG PO TBEC: DELAYED_RELEASE_TABLET | ORAL | 0 refills | Status: DC

## 2020-03-10 NOTE — Telephone Encounter (Signed)
Sent in today 

## 2020-03-10 NOTE — Telephone Encounter (Signed)
Patient called and is requesting a med refill for pantoprazole (PROTONIX) 40 MG tablet Her next OV is 03/21/2020 and she was wondering if she could get a refill until then she is currently on her last pill.    Prescription can be sent to [place], [place] - [place]   Please call pt back: [phone]

## 2020-03-12 ENCOUNTER — Other Ambulatory Visit: Payer: Self-pay | Admitting: Internal Medicine

## 2020-03-12 NOTE — Telephone Encounter (Signed)
Please refill as per office routine med refill policy (all routine meds refilled for 3 mo or monthly per pt preference up to one year from last visit, then month to month grace period for 3 mo, then further med refills will have to be denied)  

## 2020-03-14 NOTE — Progress Notes (Addendum)
Bridget Grant   [phone] [phone]   Clinic Follow up Note   Patient Care Team: Bridget Borg, MD as PCP - Bridget Bash, MD as Consulting Physician (Oncology) Bridget Finner, RN as Oncology Nurse Navigator Bridget Klein, MD as Consulting Physician (General Surgery) Bridget Banister, MD as Attending Physician (Gastroenterology) 03/15/2020  CHIEF COMPLAINT: Follow-up GIST  SUMMARY OF ONCOLOGIC HISTORY: Oncology History Overview Note  Cancer Staging No matching staging information was found for the patient.    Malignant gastrointestinal stromal tumor (GIST) of stomach ([place])  01/13/2020 Procedure   EGD/Upper Endoscopy by Dr Bridget Grant 01/13/20  IMPRESSION - There was an ulcerated mass along the greater curvature of the stomach, proximal edge was about 5-6cm from the GE junction and the mass was 7-8cm long, 2-3cm wide. It was not circumferental. The mass was friable with spontaneous oozing, contact oozing, ulcerated and heaped up. Some parts of the mass were quite soft whereas other areas were more firm. This was very suspicious for a malignancy and it was biopsied extensively. There was mild distal gastritis, biopsied to check for H. pylori. - The examination was otherwise normal.   01/13/2020 Initial Biopsy   FINAL MICROSCOPIC DIAGNOSIS:  A. STOMACH, BIOPSY:  - High grade gastrointestinal stromal tumor, see comment.   B. STOMACH, DISTAL, BIOPSY:  - Mild reactive gastropathy with erosions.  - Warthin-Starry is negative for Helicobacter pylori.  - No intestinal metaplasia, dysplasia, or malignancy.  COMMENT:  A. The tumor has spindle and epithelioid components and appears high  grade (7 mitosis/20 hpf). Immunohistochemistry is positive for CD117 and  CD34. The cells are negative for cytokeratin 20, cytokeratin 7,  cytokeratin 5/6, CDX-2, SMA, and desmin. The findings are consistent  with a gastrointestinal stromal tumor. Dr. Vic Grant has  reviewed the  case. Dr. Ardis Grant' nurse was notified on 01/18/2020.     01/13/2020 Imaging   CT CAP W contrast 01/13/20  IMPRESSION: 1. 2.1 cm x 1.1 cm well-defined area of low attenuation within the soft tissues of the left breast. This may represent a lymph node, however, correlation with mammography is recommended. 2. 6.5 mm sclerotic focus within the posterior aspect of the T7 vertebral body. This may represent a bone island, however, correlation with follow-up abdomen pelvis CT is recommended to exclude the presence of a metastatic lesion. 3. Small hiatal hernia. 4. 2.6 cm x 2.0 cm fat containing umbilical hernia. 5. Aortic atherosclerosis.    01/23/2020 Initial Diagnosis   Malignant gastrointestinal stromal tumor (GIST) of stomach ([place])   01/26/2020 -  Chemotherapy   Gleevec 458m once daily starting 01/26/20    02/11/2020 PET scan   IMPRESSION: 1. Focal mass along the stomach fundus with maximum SUV 15.7, compatible with malignancy. No findings of metastatic spread. 2. Mildly increased sub solid nodularity in the left upper lobe with only very subtle associated activity, likely inflammatory. Incidental note is made of some secondary pulmonary lobular septal thickening in the lung apices, and mild interstitial edema is not excluded given the underlying cardiomegaly. 3. Other imaging findings of potential clinical significance: Aortic Atherosclerosis (ICD10-I70.0). Coronary atherosclerosis. Sigmoid colon diverticulosis. Small umbilical hernia contains adipose tissue. Physiologic activity along the costovertebral joints.   02/29/2020 Surgery   Partial gastrectomy by Dr. BBarry Grant   02/29/2020 Pathology Results   FINAL MICROSCOPIC DIAGNOSIS:  A. STOMACH, PARTIAL GASTRECTOMY:  - High grade gastrointestinal stromal tumor with dedifferentiation,  spanning approximately 8 cm  - Tumor infiltrates through most of  the distal gastric wall and involves  the distal resection margin  - See  oncology table  - See comment  Immunohistochemical stains show that the tumor cells are positive for  CD117, CD34 and SMA (patchy).  A component of the tumor shows very  high-grade features with loss of CD117 and CD34 expression, consistent  with dedifferentiation.  Tumor also shows areas with  rhabdomyosarcomatous differentiation with presence of desmin expression.      CURRENT THERAPY: Gleevec 479m once daily starting 01/27/20  INTERVAL HISTORY: Ms. SMendelreturns for follow-up as scheduled.  She was last seen by Dr. FBurr Medicoon 02/09/2020.  She underwent PET scan on 02/11/2020 which showed stable focal mass along the stomach fundus which was hypermetabolic.  She underwent partial gastrectomy on 9/21 by Dr. BBarry Grant postop course was mostly uneventful and she was discharged with home on 03/04/2020.  He presents today by herself, she states surgery went "fine."  She is about 50% back to her baseline.  She was told she did not need PT.  Her activity has improved, she is walking out in the yard, getting the mail, and able to do most care for her husband who is paralyzed except turn/change diaper.  She denies pain, not requiring any medication.  She has mild nausea and one episode of emesis after gagging on the Gleevec.  She is eating bland foods.  She has up to 3 loose bowels per day.  Denies recent fever, chills, cough, chest pain, dyspnea, or increased leg edema.   MEDICAL HISTORY:  Past Medical History:  Diagnosis Date  . Anxiety   . Cancer ([place] 2021   stomach  . Complication of anesthesia   . Depression   . Hyperlipidemia   . Hypertension   . Migraine   . Osteopenia   . PONV (postoperative nausea and vomiting)   . Type II or unspecified type diabetes mellitus without mention of complication, uncontrolled 04/09/2013  . Upper GI bleed 01/13/2020    SURGICAL HISTORY: Past Surgical History:  Procedure Laterality Date  . ABDOMINAL HYSTERECTOMY    . APPENDECTOMY    . BIOPSY  01/13/2020    Procedure: BIOPSY;  Surgeon: JMilus Banister MD;  [facility];  Service: Endoscopy;;  . CHOLECYSTECTOMY    . ESOPHAGOGASTRODUODENOSCOPY (EGD) WITH PROPOFOL N/A 01/13/2020   Procedure: ESOPHAGOGASTRODUODENOSCOPY (EGD) WITH PROPOFOL;  Surgeon: JMilus Banister MD;  [facility];  Service: Endoscopy;  Laterality: N/A;  . PARTIAL GASTRECTOMY  02/28/2020    I have reviewed the social history and family history with the patient and they are unchanged from previous note.  ALLERGIES:  has No Known Allergies.  MEDICATIONS:  Current Outpatient Medications  Medication Sig Dispense Refill  . atorvastatin (LIPITOR) 20 MG tablet TAKE 1 TABLET BY MOUTH EVERY DAY (Patient taking differently: Take 20 mg by mouth daily. ) 90 tablet 3  . diltiazem (CARDIZEM CD) 240 MG 24 hr capsule TAKE 1 CAPSULE(240 MG) BY MOUTH DAILY (Patient taking differently: Take 240 mg by mouth daily. ) 90 capsule 3  . glipiZIDE (GLUCOTROL XL) 10 MG 24 hr tablet TAKE 1 TABLET BY MOUTH DAILY WITH BREAKFAST (Patient taking differently: Take 10 mg by mouth daily with breakfast. ) 90 tablet 0  . imatinib (GLEEVEC) 400 MG tablet Take 1 tablet (400 mg total) by mouth daily. Take with meals and large glass of water.Caution:Chemotherapy. 30 tablet 0  . lisinopril (ZESTRIL) 20 MG tablet TAKE 1 TABLET BY MOUTH EVERY DAY (Patient taking differently:  Take 20 mg by mouth at bedtime. ) 90 tablet 3  . metFORMIN (GLUCOPHAGE-XR) 500 MG 24 hr tablet TAKE 4 TABLETS(2000 MG) BY MOUTH DAILY WITH BREAKFAST (Patient taking differently: Take 1,500 mg by mouth daily with breakfast. ) 360 tablet 1  . pantoprazole (PROTONIX) 40 MG tablet TAKE 1 TABLET(40 MG) BY MOUTH TWICE DAILY BEFORE A MEAL 180 tablet 1  . pioglitazone (ACTOS) 15 MG tablet Take 1 tablet (15 mg total) by mouth daily. 90 tablet 3  . ondansetron (ZOFRAN ODT) 8 MG disintegrating tablet Take 1 tablet (8 mg total) by mouth every 8 (eight) hours as needed for nausea or vomiting. 30  tablet 2  . oxyCODONE (OXY IR/ROXICODONE) 5 MG immediate release tablet Take 1-2 tablets (5-10 mg total) by mouth every 4 (four) hours as needed for moderate pain. 30 tablet 0   No current facility-administered medications for this visit.    PHYSICAL EXAMINATION: ECOG PERFORMANCE STATUS: 1 - Symptomatic but completely ambulatory  Vitals:   03/15/20 1422  BP: (!) 139/41  Pulse: 80  Resp: 18  Temp: (!) 97.4 F (36.3 C)  SpO2: 99%   Filed Weights   03/15/20 1422  Weight: 194 lb 14.4 oz (88.4 kg)    GENERAL:alert, no distress and comfortable SKIN: No rash to exposed skin EYES:  sclera clear LUNGS: with normal breathing effort HEART: Mild bilateral lower extremity edema ABDOMEN: Scattered laparoscopic incisions healing well, no erythema or discharge.  Moderate to severe ecchymoses in the lower abdomen and pannus NEURO: alert & oriented x 3 with fluent speech,  LABORATORY DATA:  I have reviewed the data as listed CBC Latest Ref Rng & Units 03/15/2020 03/04/2020 03/03/2020  WBC 4.0 - 10.5 K/uL 7.3 8.1 -  Hemoglobin 12.0 - 15.0 g/dL 10.1(L) 8.3(L) 9.0(L)  Hematocrit 36 - 46 % 31.2(L) 25.6(L) 26.7(L)  Platelets 150 - 400 K/uL 361 197 -     CMP Latest Ref Rng & Units 03/15/2020 03/04/2020 03/03/2020  Glucose 70 - 99 mg/dL 125(H) 125(H) 142(H)  BUN 8 - 23 mg/dL 8 11 14   Creatinine 0.44 - 1.00 mg/dL 0.91 0.79 0.90  Sodium 135 - 145 mmol/L 139 138 138  Potassium 3.5 - 5.1 mmol/L 3.8 3.9 4.1  Chloride 98 - 111 mmol/L 103 103 100  CO2 22 - 32 mmol/L 30 27 28   Calcium 8.9 - 10.3 mg/dL 9.2 8.4(L) 8.4(L)  Total Protein 6.5 - 8.1 g/dL 7.0 - -  Total Bilirubin 0.3 - 1.2 mg/dL 0.8 - -  Alkaline Phos 38 - 126 U/L 52 - -  AST 15 - 41 U/L 20 - -  ALT 0 - 44 U/L 19 - -      RADIOGRAPHIC STUDIES: I have personally reviewed the radiological images as listed and agreed with the findings in the report. No results found.   ASSESSMENT & PLAN: Bridget Grant is a 74 y.o. female with     1.GastricGIST, cTxN0Mx, High grade, Kit mutation (+), pT3NX -Diagnosed in 01/2020. After presenting with severe upper GI bleeding her work up with Endoscopy showedulcerated mass along the greater curvature of the stomach, proximal edge was about 5-6cm from the GE junctionand 7-8cm long and 2-3cm wide. The path showedHigh grade gastrointestinal stromal tumor.  -Her staging CT CAP from 01/13/20 showed thickening of the greater curvature, but no gastric mass, and incidental findings of left breast mass and 6.23m sclerotic lesion at T7.  -Her diagnostic mammogram and ultrasound showed a 1 centimeter cyst in the left  breast, no other evidence of malignancy.  -PET on 02/11/2020 showed a hypermetabolic focal mass along the stomach fundus compatible with malignancy, no evidence of metastatic disease -Given her bleeding tumor and high risk features she started neoadjuvant Gleevec 400 mg once daily on 01/27/2020. Her tumor was tested for KIT mutation which showed positive mutation in exon 11.  This predicts good response to imatinib.  Continue for 3 years -She underwent partial gastrectomy by Dr. Barry Grant on 02/29/2020.  We reviewed her case in GI tumor board conference.  Path shows high-grade GIST measuring 8 cm invading through most of the distal gastric wall with positive distal resection margin as well as rhabdomyosarcomatous dedifferentiation consistent with a very high grade disease -Recovering well, continue Gleevec, pending EUS and surgical follow-up to determine further treatment plan  2. Anemia secondary to GI bleeding from #1, B12 deficiency  -She was hospitalized on 01/12/20 due to severe anemia with Hg 7.9.  -S/p blood transfusion and IV Feraheme in 01/2020  -She was on B12 injections since 05/2019, changed to oral B12 in 11/2019 -B12 is decreased since partial gastrectomy but still in normal range, will switch to B12 injections at next follow-up -Ferritin is elevated  3. Left Breast Mass -Her  01/13/20 CT scan showed 2.1cm left breast mass. She has not had mammogram in several years.  -Her diagnostic mammogram and ultrasound was negative except a 1 cm cyst in the left breast.  Not metabolic on 9/3 PET scan  4. T7 Spinal Lesion  -Her 01/13/20 CT scan also shows a6.5 mm sclerotic focus within the posterior aspect of the T7 vertebral body. -No metabolic spinal lesions on PET scan  5. Social Support -She has no children, lives with her husband who is paralyzed from a spinal tumor.  She is his primary caregiver -Limited family support from sister-in-law and brother, no home health  Disposition: Bridget Grant appears stable.  She is just over 2 weeks postop from partial gastrectomy per Dr. Barry Grant.  She is recovering well, functional and active at home.  She is still under lifting restrictions.  She has mild nausea and loose stools.  I sent in a prescription for Zofran and encouraged her to use Imodium, to hydrate and eat small frequent amounts.  We reviewed the surgical pathology which shows high-grade GIST spanning 8 cm infiltrating through most of the distal gastric wall with a positive distal resection margin, as was staged pT3. Pathology also shows areas of rhabdomyosarcomatous differentiation but is not felt to represent a mixed rhabdomyosarcoma/GIST tumor.  Unfortunately, given these features she is at very high risk for recurrence and metastasis.   Her case was discussed in GI tumor board today. We are recommending to continue [place]. She will follow-up with Dr. Barry Grant on 10/11 and likely undergo EUS with Dr. Ardis Grant to evaluate the extent of tumor involvement in the remaining stomach.  She understands a second surgery might be recommended she is apprehensive.  She will return for lab and follow-up in 1 month.  Given her low-normal range B12, I recommend to resume B12 injections starting at next visit.  The CBC and CMP are otherwise stable.    If she does not proceed with surgery we will  continue [place] and proceed with close surveillance which will likely include EUS and scans every 3-6 months.   Patient seen with Dr. Burr Medico.   All questions were answered. The patient knows to call the clinic with any problems, questions or concerns. No barriers to learning was  detected.     Alla Feeling, NP 03/15/20   Addendum  I have seen the patient, examined her. I agree with the assessment and and plan and have edited the notes.   Case was discussed in our GI conference this morning.  She underwent partial gastrostomy, which showed high-grade GIST, and unfortunately it had extensive positive margins.  Dr. Barry Grant recommends a repeated EUS, to evaluate any residual disease and she will discuss re-excision with her.  However patient is very reluctant to undergo second surgery, due to the recovery time, and her responsibility as a caregiver for her husband.  We discussed the risk of recurrence is extremely high if no further surgery. She will think about it. She will continue Gleevec.  We discussed the duration of Gleevec as adjuvant therapy will be 3 years.but if she does not undergo reexcision surgery, she will likely continue [place] until disease progression.  F/U in a month.   Truitt Merle  03/15/2020

## 2020-03-15 ENCOUNTER — Encounter: Payer: Self-pay | Admitting: Nurse Practitioner

## 2020-03-15 ENCOUNTER — Other Ambulatory Visit: Payer: Self-pay

## 2020-03-15 ENCOUNTER — Telehealth: Payer: Self-pay | Admitting: Gastroenterology

## 2020-03-15 ENCOUNTER — Inpatient Hospital Stay: Payer: Medicare Other | Attending: Hematology

## 2020-03-15 ENCOUNTER — Inpatient Hospital Stay (HOSPITAL_BASED_OUTPATIENT_CLINIC_OR_DEPARTMENT_OTHER): Payer: Medicare Other | Admitting: Nurse Practitioner

## 2020-03-15 VITALS — BP 139/41 | HR 80 | Temp 97.4°F | Resp 18 | Ht 62.0 in | Wt 194.9 lb

## 2020-03-15 DIAGNOSIS — R110 Nausea: Secondary | ICD-10-CM | POA: Insufficient documentation

## 2020-03-15 DIAGNOSIS — R194 Change in bowel habit: Secondary | ICD-10-CM | POA: Insufficient documentation

## 2020-03-15 DIAGNOSIS — E538 Deficiency of other specified B group vitamins: Secondary | ICD-10-CM

## 2020-03-15 DIAGNOSIS — C49A2 Gastrointestinal stromal tumor of stomach: Secondary | ICD-10-CM

## 2020-03-15 DIAGNOSIS — K922 Gastrointestinal hemorrhage, unspecified: Secondary | ICD-10-CM | POA: Insufficient documentation

## 2020-03-15 DIAGNOSIS — N6320 Unspecified lump in the left breast, unspecified quadrant: Secondary | ICD-10-CM | POA: Insufficient documentation

## 2020-03-15 DIAGNOSIS — D500 Iron deficiency anemia secondary to blood loss (chronic): Secondary | ICD-10-CM | POA: Diagnosis not present

## 2020-03-15 LAB — CBC WITH DIFFERENTIAL (CANCER CENTER ONLY)
Abs Immature Granulocytes: 0.02 10*3/uL (ref 0.00–0.07)
Basophils Absolute: 0 10*3/uL (ref 0.0–0.1)
Basophils Relative: 0 %
Eosinophils Absolute: 0.2 10*3/uL (ref 0.0–0.5)
Eosinophils Relative: 3 %
HCT: 31.2 % — ABNORMAL LOW (ref 36.0–46.0)
Hemoglobin: 10.1 g/dL — ABNORMAL LOW (ref 12.0–15.0)
Immature Granulocytes: 0 %
Lymphocytes Relative: 22 %
Lymphs Abs: 1.6 10*3/uL (ref 0.7–4.0)
MCH: 32.1 pg (ref 26.0–34.0)
MCHC: 32.4 g/dL (ref 30.0–36.0)
MCV: 99 fL (ref 80.0–100.0)
Monocytes Absolute: 0.7 10*3/uL (ref 0.1–1.0)
Monocytes Relative: 10 %
Neutro Abs: 4.7 10*3/uL (ref 1.7–7.7)
Neutrophils Relative %: 65 %
Platelet Count: 361 10*3/uL (ref 150–400)
RBC: 3.15 MIL/uL — ABNORMAL LOW (ref 3.87–5.11)
RDW: 15.9 % — ABNORMAL HIGH (ref 11.5–15.5)
WBC Count: 7.3 10*3/uL (ref 4.0–10.5)
nRBC: 0 % (ref 0.0–0.2)

## 2020-03-15 LAB — CMP (CANCER CENTER ONLY)
ALT: 19 U/L (ref 0–44)
AST: 20 U/L (ref 15–41)
Albumin: 3.7 g/dL (ref 3.5–5.0)
Alkaline Phosphatase: 52 U/L (ref 38–126)
Anion gap: 6 (ref 5–15)
BUN: 8 mg/dL (ref 8–23)
CO2: 30 mmol/L (ref 22–32)
Calcium: 9.2 mg/dL (ref 8.9–10.3)
Chloride: 103 mmol/L (ref 98–111)
Creatinine: 0.91 mg/dL (ref 0.44–1.00)
GFR, Estimated: >60 mL/min (ref >60)
Glucose, Bld: 125 mg/dL — ABNORMAL HIGH (ref 70–99)
Potassium: 3.8 mmol/L (ref 3.5–5.1)
Sodium: 139 mmol/L (ref 135–145)
Total Bilirubin: 0.8 mg/dL (ref 0.3–1.2)
Total Protein: 7 g/dL (ref 6.5–8.1)

## 2020-03-15 LAB — VITAMIN B12: Vitamin B-12: 308 pg/mL (ref 180–914)

## 2020-03-15 LAB — FERRITIN: Ferritin: 1267 ng/mL — ABNORMAL HIGH (ref 11–307)

## 2020-03-15 LAB — RETIC PANEL
Immature Retic Fract: 9.4 % (ref 2.3–15.9)
RBC.: 3.14 MIL/uL — ABNORMAL LOW (ref 3.87–5.11)
Retic Count, Absolute: 38.9 10*3/uL (ref 19.0–186.0)
Retic Ct Pct: 1.2 % (ref 0.4–3.1)
Reticulocyte Hemoglobin: 38.4 pg (ref >27.9)

## 2020-03-15 MED ORDER — ONDANSETRON 8 MG PO TBDP: 8.0000 mg | ORAL_TABLET | Freq: Three times a day (TID) | ORAL | 2 refills | Status: DC | PRN

## 2020-03-15 NOTE — Telephone Encounter (Signed)
Can you please contact her?  Let her know we had a nice discussion about her at her case at our weekly GI tumor board this morning.  Dr. Barry Dienes asked that I reach out to arrange endoscopic ultrasound evaluation of her stomach.  The patient should already be aware of this.   She needs first available EUS appointment with me for gastric mass.  My plan will be to evaluate the staple line, thickness of the stomach around the staple line to give an idea of how far the tumor extends throughout the rest of her stomach.

## 2020-03-15 NOTE — Telephone Encounter (Signed)
Can you please call her again tomorrow.  I just received a note from medical oncology and they explained the rationale for endoscopic ultrasound and they informed me that she has agreed to go ahead with it.  Thanks

## 2020-03-15 NOTE — Telephone Encounter (Signed)
Dr Ardis Hughs I spoke with the pt and she prefers to see Dr Barry Dienes on Monday and then call back to make appt.  She was not aware of why she needed this test.  She says she has not spoken to Dr Barry Dienes.  I will call her on Tuesday to see if she is ready to schedule.

## 2020-03-16 ENCOUNTER — Telehealth: Payer: Self-pay | Admitting: Nurse Practitioner

## 2020-03-16 NOTE — Telephone Encounter (Signed)
Scheduled per 10/6 los. Pt is aware of appt times and date.

## 2020-03-20 NOTE — Telephone Encounter (Signed)
Left message on machine to call back  

## 2020-03-21 ENCOUNTER — Other Ambulatory Visit: Payer: Self-pay

## 2020-03-21 ENCOUNTER — Encounter: Payer: Self-pay | Admitting: Internal Medicine

## 2020-03-21 ENCOUNTER — Ambulatory Visit (INDEPENDENT_AMBULATORY_CARE_PROVIDER_SITE_OTHER): Payer: Medicare Other | Admitting: Internal Medicine

## 2020-03-21 VITALS — BP 130/68 | HR 73 | Temp 98.8°F | Ht 62.0 in | Wt 191.0 lb

## 2020-03-21 DIAGNOSIS — I10 Essential (primary) hypertension: Secondary | ICD-10-CM | POA: Diagnosis not present

## 2020-03-21 DIAGNOSIS — E119 Type 2 diabetes mellitus without complications: Secondary | ICD-10-CM

## 2020-03-21 DIAGNOSIS — C49A0 Gastrointestinal stromal tumor, unspecified site: Secondary | ICD-10-CM

## 2020-03-21 DIAGNOSIS — E785 Hyperlipidemia, unspecified: Secondary | ICD-10-CM | POA: Diagnosis not present

## 2020-03-21 LAB — METHYLMALONIC ACID, SERUM: Methylmalonic Acid, Quantitative: 82 nmol/L (ref 0–378)

## 2020-03-21 MED ORDER — LISINOPRIL 20 MG PO TABS
10.0000 mg | ORAL_TABLET | Freq: Every day | ORAL | 3 refills | Status: DC
Start: 2020-03-21 — End: 2021-10-08

## 2020-03-21 MED ORDER — METFORMIN HCL ER 500 MG PO TB24: 1000.0000 mg | ORAL_TABLET | Freq: Every day | ORAL | 3 refills | Status: AC

## 2020-03-21 NOTE — Patient Instructions (Signed)
Ok to stop the actos (pioglitazone) and the glipizide  Please continue the metformin ER 500 mg at 2 pills per day, though you can take up to 4 if needed for sugar > 200  Ok to decrease the lisinopril to 10 mg per day (half of the 20 mg pill) - this should be abe to be done I think by cutting the pill  Please continue all other medications as before, and refills have been done if requested.  Please have the pharmacy call with any other refills you may need.  Please continue your efforts at being more active, low cholesterol diet, and weight control.  Please keep your appointments with your specialists as you may have planned  Please make an Appointment to return in 4 months

## 2020-03-21 NOTE — Progress Notes (Signed)
Subjective:    Patient ID: Bridget Grant, female    DOB: November 11, 1945, 74 y.o.   MRN: [id number]  HPI   Here to f/u; overall doing ok,  Pt denies chest pain, increasing sob or doe, wheezing, orthopnea, PND, increased LE swelling, palpitations, dizziness or syncope.  Pt denies new neurological symptoms such as new headache, or facial or extremity weakness or numbness.  Pt denies polydipsia, polyuria, or low sugar episode.  Pt states overall good compliance with meds, but Losing wt, wondering if can come off some meds.  Had mcdonald burger yesterday - was very good.  Feel cold all the time but also both hand new onset shkaking and tremors, cant eat soup as goes all over the place.   Wt Readings from Last 3 Encounters:  03/21/20 191 lb (86.6 kg)  03/15/20 194 lb 14.4 oz (88.4 kg)  02/29/20 207 lb (93.9 kg)   Past Medical History:  Diagnosis Date  . Anxiety   . Cancer ([place]) 2021   stomach  . Complication of anesthesia   . Depression   . Hyperlipidemia   . Hypertension   . Migraine   . Osteopenia   . PONV (postoperative nausea and vomiting)   . Type II or unspecified type diabetes mellitus without mention of complication, uncontrolled 04/09/2013  . Upper GI bleed 01/13/2020   Past Surgical History:  Procedure Laterality Date  . ABDOMINAL HYSTERECTOMY    . APPENDECTOMY    . BIOPSY  01/13/2020   Procedure: BIOPSY;  Surgeon: Milus Banister, MD;  [facility];  Service: Endoscopy;;  . CHOLECYSTECTOMY    . ESOPHAGOGASTRODUODENOSCOPY (EGD) WITH PROPOFOL N/A 01/13/2020   Procedure: ESOPHAGOGASTRODUODENOSCOPY (EGD) WITH PROPOFOL;  Surgeon: Milus Banister, MD;  [facility];  Service: Endoscopy;  Laterality: N/A;  . PARTIAL GASTRECTOMY  02/28/2020    reports that she has never smoked. She has never used smokeless tobacco. She reports that she does not drink alcohol and does not use drugs. family history includes Breast cancer (age of onset: 91) in her mother; Cancer in her  maternal grandmother; Cancer (age of onset: 14) in her mother; Hypertension in her father. No Known Allergies Current Outpatient Medications on File Prior to Visit  Medication Sig Dispense Refill  . atorvastatin (LIPITOR) 20 MG tablet TAKE 1 TABLET BY MOUTH EVERY DAY (Patient taking differently: Take 20 mg by mouth daily. ) 90 tablet 3  . diltiazem (CARDIZEM CD) 240 MG 24 hr capsule TAKE 1 CAPSULE(240 MG) BY MOUTH DAILY (Patient taking differently: Take 240 mg by mouth daily. ) 90 capsule 3  . imatinib (GLEEVEC) 400 MG tablet Take 1 tablet (400 mg total) by mouth daily. Take with meals and large glass of water.Caution:Chemotherapy. 30 tablet 0  . ondansetron (ZOFRAN ODT) 8 MG disintegrating tablet Take 1 tablet (8 mg total) by mouth every 8 (eight) hours as needed for nausea or vomiting. 30 tablet 2  . oxyCODONE (OXY IR/ROXICODONE) 5 MG immediate release tablet Take 1-2 tablets (5-10 mg total) by mouth every 4 (four) hours as needed for moderate pain. 30 tablet 0  . pantoprazole (PROTONIX) 40 MG tablet TAKE 1 TABLET(40 MG) BY MOUTH TWICE DAILY BEFORE A MEAL 180 tablet 1  . [DISCONTINUED] diltiazem (TIAZAC) 240 MG 24 hr capsule Take 1 capsule (240 mg total) by mouth daily. 90 capsule 3   No current facility-administered medications on file prior to visit.   Review of Systems All otherwise neg per pt  Objective:   Physical Exam BP 130/68 (BP Location: Left Arm, Patient Position: Sitting, Cuff Size: Large)   Pulse 73   Temp 98.8 F (37.1 C) (Oral)   Ht 5\' 2"  (1.575 m)   Wt 191 lb (86.6 kg)   SpO2 94%   BMI 34.93 kg/m  VS noted,  Constitutional: Pt appears in NAD HENT: Head: NCAT.  Right Ear: External ear normal.  Left Ear: External ear normal.  Eyes: . Pupils are equal, round, and reactive to light. Conjunctivae and EOM are normal Nose: without d/c or deformity Neck: Neck supple. Gross normal ROM Cardiovascular: Normal rate and regular rhythm.   Pulmonary/Chest: Effort normal  and breath sounds without rales or wheezing.  Abd:  Soft, NT, ND, + BS, no organomegaly Neurological: Pt is alert. At baseline orientation, motor grossly intact Skin: Skin is warm. No rashes, other new lesions, no LE edema Psychiatric: Pt behavior is normal without agitation  All otherwise neg per pt  Lab Results  Component Value Date   WBC 7.3 03/15/2020   HGB 10.1 (L) 03/15/2020   HCT 31.2 (L) 03/15/2020   PLT 361 03/15/2020   GLUCOSE 125 (H) 03/15/2020   CHOL 142 11/16/2019   TRIG 192.0 (H) 11/16/2019   HDL 41.90 11/16/2019   LDLDIRECT 85.0 10/16/2016   LDLCALC 62 11/16/2019   ALT 19 03/15/2020   AST 20 03/15/2020   NA 139 03/15/2020   K 3.8 03/15/2020   CL 103 03/15/2020   CREATININE 0.91 03/15/2020   BUN 8 03/15/2020   CO2 30 03/15/2020   TSH 2.21 11/16/2019   INR 0.9 01/11/2020   HGBA1C 5.6 02/23/2020   MICROALBUR 1.1 11/16/2019       Assessment & Plan:

## 2020-03-21 NOTE — H&P (View-Only) (Signed)
Subjective:    Patient ID: Bridget Grant, female    DOB: 1945-08-28, 74 y.o.   MRN: [id number]  HPI   Here to f/u; overall doing ok,  Pt denies chest pain, increasing sob or doe, wheezing, orthopnea, PND, increased LE swelling, palpitations, dizziness or syncope.  Pt denies new neurological symptoms such as new headache, or facial or extremity weakness or numbness.  Pt denies polydipsia, polyuria, or low sugar episode.  Pt states overall good compliance with meds, but Losing wt, wondering if can come off some meds.  Had mcdonald burger yesterday - was very good.  Feel cold all the time but also both hand new onset shkaking and tremors, cant eat soup as goes all over the place.   Wt Readings from Last 3 Encounters:  03/21/20 191 lb (86.6 kg)  03/15/20 194 lb 14.4 oz (88.4 kg)  02/29/20 207 lb (93.9 kg)   Past Medical History:  Diagnosis Date  . Anxiety   . Cancer ([place]) 2021   stomach  . Complication of anesthesia   . Depression   . Hyperlipidemia   . Hypertension   . Migraine   . Osteopenia   . PONV (postoperative nausea and vomiting)   . Type II or unspecified type diabetes mellitus without mention of complication, uncontrolled 04/09/2013  . Upper GI bleed 01/13/2020   Past Surgical History:  Procedure Laterality Date  . ABDOMINAL HYSTERECTOMY    . APPENDECTOMY    . BIOPSY  01/13/2020   Procedure: BIOPSY;  Surgeon: Milus Banister, MD;  Location: [facility] ENDOSCOPY;  Service: Endoscopy;;  . CHOLECYSTECTOMY    . ESOPHAGOGASTRODUODENOSCOPY (EGD) WITH PROPOFOL N/A 01/13/2020   Procedure: ESOPHAGOGASTRODUODENOSCOPY (EGD) WITH PROPOFOL;  Surgeon: Milus Banister, MD;  Location: [facility] ENDOSCOPY;  Service: Endoscopy;  Laterality: N/A;  . PARTIAL GASTRECTOMY  02/28/2020    reports that she has never smoked. She has never used smokeless tobacco. She reports that she does not drink alcohol and does not use drugs. family history includes Breast cancer (age of onset: 59) in her mother; Cancer in her  maternal grandmother; Cancer (age of onset: 39) in her mother; Hypertension in her father. No Known Allergies Current Outpatient Medications on File Prior to Visit  Medication Sig Dispense Refill  . atorvastatin (LIPITOR) 20 MG tablet TAKE 1 TABLET BY MOUTH EVERY DAY (Patient taking differently: Take 20 mg by mouth daily. ) 90 tablet 3  . diltiazem (CARDIZEM CD) 240 MG 24 hr capsule TAKE 1 CAPSULE(240 MG) BY MOUTH DAILY (Patient taking differently: Take 240 mg by mouth daily. ) 90 capsule 3  . imatinib (GLEEVEC) 400 MG tablet Take 1 tablet (400 mg total) by mouth daily. Take with meals and large glass of water.Caution:Chemotherapy. 30 tablet 0  . ondansetron (ZOFRAN ODT) 8 MG disintegrating tablet Take 1 tablet (8 mg total) by mouth every 8 (eight) hours as needed for nausea or vomiting. 30 tablet 2  . oxyCODONE (OXY IR/ROXICODONE) 5 MG immediate release tablet Take 1-2 tablets (5-10 mg total) by mouth every 4 (four) hours as needed for moderate pain. 30 tablet 0  . pantoprazole (PROTONIX) 40 MG tablet TAKE 1 TABLET(40 MG) BY MOUTH TWICE DAILY BEFORE A MEAL 180 tablet 1  . [DISCONTINUED] diltiazem (TIAZAC) 240 MG 24 hr capsule Take 1 capsule (240 mg total) by mouth daily. 90 capsule 3   No current facility-administered medications on file prior to visit.   Review of Systems All otherwise neg per pt  Objective:   Physical Exam BP 130/68 (BP Location: Left Arm, Patient Position: Sitting, Cuff Size: Large)   Pulse 73   Temp 98.8 F (37.1 C) (Oral)   Ht 5\' 2"  (1.575 m)   Wt 191 lb (86.6 kg)   SpO2 94%   BMI 34.93 kg/m  VS noted,  Constitutional: Pt appears in NAD HENT: Head: NCAT.  Right Ear: External ear normal.  Left Ear: External ear normal.  Eyes: . Pupils are equal, round, and reactive to light. Conjunctivae and EOM are normal Nose: without d/c or deformity Neck: Neck supple. Gross normal ROM Cardiovascular: Normal rate and regular rhythm.   Pulmonary/Chest: Effort normal  and breath sounds without rales or wheezing.  Abd:  Soft, NT, ND, + BS, no organomegaly Neurological: Pt is alert. At baseline orientation, motor grossly intact Skin: Skin is warm. No rashes, other new lesions, no LE edema Psychiatric: Pt behavior is normal without agitation  All otherwise neg per pt  Lab Results  Component Value Date   WBC 7.3 03/15/2020   HGB 10.1 (L) 03/15/2020   HCT 31.2 (L) 03/15/2020   PLT 361 03/15/2020   GLUCOSE 125 (H) 03/15/2020   CHOL 142 11/16/2019   TRIG 192.0 (H) 11/16/2019   HDL 41.90 11/16/2019   LDLDIRECT 85.0 10/16/2016   LDLCALC 62 11/16/2019   ALT 19 03/15/2020   AST 20 03/15/2020   NA 139 03/15/2020   K 3.8 03/15/2020   CL 103 03/15/2020   CREATININE 0.91 03/15/2020   BUN 8 03/15/2020   CO2 30 03/15/2020   TSH 2.21 11/16/2019   INR 0.9 01/11/2020   HGBA1C 5.6 02/23/2020   MICROALBUR 1.1 11/16/2019       Assessment & Plan:

## 2020-03-21 NOTE — Telephone Encounter (Signed)
I spoke the pt and she has been scheduled for EUS with Dr Ardis Hughs on 11/4-pt instructed and all information sent to the pt via mail.

## 2020-03-28 ENCOUNTER — Encounter: Payer: Self-pay | Admitting: Internal Medicine

## 2020-03-28 NOTE — Assessment & Plan Note (Signed)
stable overall by history and exam, recent data reviewed with pt, and pt to continue medical treatment as before,  to f/u any worsening symptoms or concerns  

## 2020-03-28 NOTE — Assessment & Plan Note (Signed)
With wt loss now overcontrolled, to d/c actos and glipizide,  to f/u any worsening symptoms or concerns

## 2020-03-28 NOTE — Assessment & Plan Note (Signed)
F/u oncology as planned

## 2020-03-28 NOTE — Assessment & Plan Note (Addendum)
Losing wt, ok for decreased lisinopril 10 qd  I spent 31 minutes in preparing to see the patient by review of recent labs, imaging and procedures, obtaining and reviewing separately obtained history, communicating with the patient and family or caregiver, ordering medications, tests or procedures, and documenting clinical information in the EHR including the differential Dx, treatment, and any further evaluation and other management of htn, dm, hld, gist

## 2020-04-04 ENCOUNTER — Other Ambulatory Visit: Payer: Self-pay | Admitting: Hematology

## 2020-04-04 DIAGNOSIS — C49A2 Gastrointestinal stromal tumor of stomach: Secondary | ICD-10-CM

## 2020-04-10 ENCOUNTER — Other Ambulatory Visit (HOSPITAL_COMMUNITY)
Admission: RE | Admit: 2020-04-10 | Discharge: 2020-04-10 | Disposition: A | Discharge status: Home / Self Care | Payer: Medicare Other | Source: Ambulatory Visit | Attending: Gastroenterology | Admitting: Gastroenterology

## 2020-04-10 DIAGNOSIS — Z01812 Encounter for preprocedural laboratory examination: Secondary | ICD-10-CM | POA: Diagnosis not present

## 2020-04-10 DIAGNOSIS — Z20822 Contact with and (suspected) exposure to covid-19: Secondary | ICD-10-CM | POA: Diagnosis not present

## 2020-04-11 LAB — SARS CORONAVIRUS 2 (TAT 6-24 HRS): SARS Coronavirus 2: NEGATIVE

## 2020-04-12 NOTE — Progress Notes (Signed)
[place]   [phone] [phone]   Clinic Follow up Note   Patient Care Team: [name], MD as PCP - Charissa Bash, MD as Consulting Physician (Oncology) Jonnie Finner, RN as Oncology Nurse Navigator Stark Klein, MD as Consulting Physician (General Surgery) Milus Banister, MD as Attending Physician (Gastroenterology)  Date of Service:  04/14/2020  CHIEF COMPLAINT: F/u of GIST  SUMMARY OF ONCOLOGIC HISTORY: Oncology History Overview Note  Cancer Staging Malignant gastrointestinal stromal tumor (GIST) of stomach ([place]) Staging form: Gastrointestinal Stromal Tumor - Gastric and Omental GIST, AJCC 8th Edition - Pathologic stage from 02/29/2020: pT3, Mitotic Rate: High - Unsigned    Malignant gastrointestinal stromal tumor (GIST) of stomach ([place])  01/13/2020 Procedure   EGD/Upper Endoscopy by Dr Ardis Hughs 01/13/20  IMPRESSION - There was an ulcerated mass along the greater curvature of the stomach, proximal edge was about 5-6cm from the GE junction and the mass was 7-8cm long, 2-3cm wide. It was not circumferental. The mass was friable with spontaneous oozing, contact oozing, ulcerated and heaped up. Some parts of the mass were quite soft whereas other areas were more firm. This was very suspicious for a malignancy and it was biopsied extensively. There was mild distal gastritis, biopsied to check for H. pylori. - The examination was otherwise normal.   01/13/2020 Initial Biopsy   FINAL MICROSCOPIC DIAGNOSIS:  A. STOMACH, BIOPSY:  - High grade gastrointestinal stromal tumor, see comment.   B. STOMACH, DISTAL, BIOPSY:  - Mild reactive gastropathy with erosions.  - Warthin-Starry is negative for Helicobacter pylori.  - No intestinal metaplasia, dysplasia, or malignancy.  COMMENT:  A. The tumor has spindle and epithelioid components and appears high  grade (7 mitosis/20 hpf). Immunohistochemistry is positive for CD117 and  CD34. The  cells are negative for cytokeratin 20, cytokeratin 7,  cytokeratin 5/6, CDX-2, SMA, and desmin. The findings are consistent  with a gastrointestinal stromal tumor. Dr. Vic Ripper has reviewed the  case. Dr. Ardis Hughs' nurse was notified on 01/18/2020.     01/13/2020 Imaging   CT CAP W contrast 01/13/20  IMPRESSION: 1. 2.1 cm x 1.1 cm well-defined area of low attenuation within the soft tissues of the left breast. This may represent a lymph node, however, correlation with mammography is recommended. 2. 6.5 mm sclerotic focus within the posterior aspect of the T7 vertebral body. This may represent a bone island, however, correlation with follow-up abdomen pelvis CT is recommended to exclude the presence of a metastatic lesion. 3. Small hiatal hernia. 4. 2.6 cm x 2.0 cm fat containing umbilical hernia. 5. Aortic atherosclerosis.    01/23/2020 Initial Diagnosis   Malignant gastrointestinal stromal tumor (GIST) of stomach ([place])   01/26/2020 -  Chemotherapy   Gleevec 441m once daily starting 01/26/20    02/11/2020 PET scan   IMPRESSION: 1. Focal mass along the stomach fundus with maximum SUV 15.7, compatible with malignancy. No findings of metastatic spread. 2. Mildly increased sub solid nodularity in the left upper lobe with only very subtle associated activity, likely inflammatory. Incidental note is made of some secondary pulmonary lobular septal thickening in the lung apices, and mild interstitial edema is not excluded given the underlying cardiomegaly. 3. Other imaging findings of potential clinical significance: Aortic Atherosclerosis (ICD10-I70.0). Coronary atherosclerosis. Sigmoid colon diverticulosis. Small umbilical hernia contains adipose tissue. Physiologic activity along the costovertebral joints.   02/29/2020 Surgery   Partial gastrectomy by Dr. BBarry Dienes   02/29/2020 Pathology Results  FINAL MICROSCOPIC DIAGNOSIS:  A. STOMACH, PARTIAL GASTRECTOMY:  - High grade  gastrointestinal stromal tumor with dedifferentiation,  spanning approximately 8 cm  - Tumor infiltrates through most of the distal gastric wall and involves  the distal resection margin  - See oncology table  - See comment  Immunohistochemical stains show that the tumor cells are positive for  CD117, CD34 and SMA (patchy).  A component of the tumor shows very  high-grade features with loss of CD117 and CD34 expression, consistent  with dedifferentiation.  Tumor also shows areas with  rhabdomyosarcomatous differentiation with presence of desmin expression.    04/13/2020 Procedure   EUS  by Dr Ardis Hughs  IMPRESSION - The staple line from recent partial gastrectomy was noted in the proximal stomach, runs longitudinally along the gastric fundus. The mucosa immediately around the stable line was normal appearing however in the more distal gastric body (anterior wall likely) the mucosa was quite irregular appearing; friable, firm, slightly ulcerated for 4-5cm at least. This was not a discrete tumor, and these changes were about 5cm from the pylorus. I sampled this region extensively with forceps following EUS evaluation below.- The muscularis propria was abnormally thickened starting around the staple line and continuing throughout most of the stomach distally. This thickening was not circumferential.     FINAL MICROSCOPIC DIAGNOSIS:   A. GASTRIC BODY, BIOPSY:  - Gastric mucosa with changes consistent with reactive gastropathy.  - No spindle cell lesion, intestinal metaplasia, dysplasia or carcinoma.        CURRENT THERAPY:  Gleevec 468m once daily starting 01/27/20  INTERVAL HISTORY:  Bridget Grant here for a follow up. She presents to the clinic alone. She notes her EUS yesterday went well. She notes she recovered well. She notes she has modecate LE edema. She has both her COVID vaccines and her flu shot. [place] She is interested in using Biotin which I  suggested.     REVIEW OF SYSTEMS:   Constitutional: Denies fevers, chills or abnormal weight loss Eyes: Denies blurriness of vision Ears, nose, mouth, throat, and face: Denies mucositis or sore throat Respiratory: Denies cough, dyspnea or wheezes Cardiovascular: Denies palpitation, chest discomfort (+)  lower extremity swelling Gastrointestinal:  Denies nausea, heartburn or change in bowel habits Skin: Denies abnormal skin rashes Lymphatics: Denies new lymphadenopathy or easy bruising Neurological:Denies numbness, tingling or new weaknesses Behavioral/Psych: Mood is stable, no new changes  All other systems were reviewed with the patient and are negative.  MEDICAL HISTORY:  Past Medical History:  Diagnosis Date  . Anxiety   [place]   st[place]ach  . Complication of anesthesia   . Depression   . Hyperlipidemia   . Hypertension   . Migraine   . Osteopenia   . PONV (postoperative nausea and vomiting)   . Type II or unspecified type diabetes mellitus without mention of complication, uncontrolled 04/09/2013  . Upper GI bleed 01/13/2020    SURGICAL HISTORY: Past Surgical History:  Procedure Laterality Date  . ABDOMINAL HYSTERECTOMY    . APPENDECTOMY    . BIOPSY  01/13/2020   Procedure: BIOPSY;  Surgeon: JMilus Banister MD;  [facility];  Service: Endoscopy;;  . BIOPSY  04/13/2020   Procedure: BIOPSY;  Surgeon: JMilus Banister MD;  Location: [facility] ENDOSCOPY;  Service: Endoscopy;;  . CHOLECYSTECTOMY    . ESOPHAGOGASTRODUODENOSCOPY (EGD) WITH PROPOFOL N/A 01/13/2020   Procedure: ESOPHAGOGASTRODUODENOSCOPY (EGD) WITH PROPOFOL;  Surgeon: JMilus Banister  MD;  Location: [place] ENDOSCOPY;  Service: Endoscopy;  Laterality: N/A;  . ESOPHAGOGASTRODUODENOSCOPY (EGD) WITH PROPOFOL N/A 04/13/2020   Procedure: ESOPHAGOGASTRODUODENOSCOPY (EGD) WITH PROPOFOL;  Surgeon: Milus Banister, MD;  Location: [facility] ENDOSCOPY;  Service: Endoscopy;  Laterality: N/A;  . EUS N/A 04/13/2020    Procedure: UPPER ENDOSCOPIC ULTRASOUND (EUS) LINEAR;  Surgeon: Milus Banister, MD;  Location: [facility] ENDOSCOPY;  Service: Endoscopy;  Laterality: N/A;  . PARTIAL GASTRECTOMY  02/28/2020    I have reviewed the social history and family history with the patient and they are unchanged from previous note.  ALLERGIES:  has No Known Allergies.  MEDICATIONS:  Current Outpatient Medications  Medication Sig Dispense Refill  . atorvastatin (LIPITOR) 20 MG tablet TAKE 1 TABLET BY MOUTH EVERY DAY (Patient taking differently: Take 20 mg by mouth daily. ) 90 tablet 3  . diltiazem (CARDIZEM CD) 240 MG 24 hr capsule TAKE 1 CAPSULE(240 MG) BY MOUTH DAILY (Patient taking differently: Take 240 mg by mouth daily. ) 90 capsule 3  . imatinib (GLEEVEC) 400 MG tablet TAKE 1 TABLET (400 MG TOTAL) BY MOUTH DAILY . TAKE WITH MEALS AND A LARGE GLASS OF WATER. (Patient taking differently: Take 400 mg by mouth daily. ) 30 tablet 4  . lisinopril (ZESTRIL) 20 MG tablet Take 0.5 tablets (10 mg total) by mouth daily. 45 tablet 3  . metFORMIN (GLUCOPHAGE-XR) 500 MG 24 hr tablet Take 2 tablets (1,000 mg total) by mouth daily with breakfast. 180 tablet 3  . ondansetron (ZOFRAN ODT) 8 MG disintegrating tablet Take 1 tablet (8 mg total) by mouth every 8 (eight) hours as needed for nausea or vomiting. 30 tablet 2  . oxyCODONE (OXY IR/ROXICODONE) 5 MG immediate release tablet Take 1-2 tablets (5-10 mg total) by mouth every 4 (four) hours as needed for moderate pain. 30 tablet 0  . pantoprazole (PROTONIX) 40 MG tablet TAKE 1 TABLET(40 MG) BY MOUTH TWICE DAILY BEFORE A MEAL (Patient taking differently: Take 40 mg by mouth 2 (two) times daily before a meal. TAKE 1 TABLET(40 MG) BY MOUTH TWICE DAILY BEFORE A MEAL) 180 tablet 1   No current facility-administered medications for this visit.    PHYSICAL EXAMINATION: ECOG PERFORMANCE STATUS: 1 - Symptomatic but completely ambulatory  Vitals:   04/14/20 1456  BP: (!) 156/72  Pulse: 87    Resp: 20  Temp: 99.8 F (37.7 C)  SpO2: 96%   Filed Weights   04/14/20 1456  Weight: 194 lb 11.2 oz (88.3 kg)    GENERAL:alert, no distress and comfortable SKIN: skin color, texture, turgor are normal, no rashes or significant lesions EYES: normal, Conjunctiva are pink and non-injected, sclera clear  NECK: supple, thyroid normal size, non-tender, without nodularity LYMPH:  no palpable lymphadenopathy in the cervical, axillary  LUNGS: clear to auscultation and percussion with normal breathing effort HEART: regular rate & rhythm and no murmurs (+) b/l lower extremity edema ABDOMEN:abdomen soft, non-tender and normal bowel sounds (+) Surgical incision healed well (+) Skin bruising of abdomen Musculoskeletal:no cyanosis of digits and no clubbing  NEURO: alert & oriented x 3 with fluent speech, no focal motor/sensory deficits  LABORATORY DATA:  I have reviewed the data as listed CBC Latest Ref Rng & Units 04/14/2020 03/15/2020 03/04/2020  WBC 4.0 - 10.5 K/uL 5.2 7.3 8.1  Hemoglobin 12.0 - 15.0 g/dL 9.6(L) 10.1(L) 8.3(L)  Hematocrit 36 - 46 % 29.3(L) 31.2(L) 25.6(L)  Platelets 150 - 400 K/uL 235 361 197     CMP Latest  Ref Rng & Units 03/15/2020 03/04/2020 03/03/2020  Glucose 70 - 99 mg/dL 125(H) 125(H) 142(H)  BUN 8 - 23 mg/dL _0 Creatinine 0.44 - 1.00 mg/dL 0.91 0.79 0.90  Sodium 135 - 145 mmol/L 139 138 138  Potassium 3.5 - 5.1 mmol/L 3.8 3.9 4.1  Chloride 98 - 111 mmol/L 103 103 100  CO2 22 - 32 mmol/L _1 Calcium 8.9 - 10.3 mg/dL 9.2 8.4(L) 8.4(L)  Total Protein 6.5 - 8.1 g/dL 7.0 - -  Total Bilirubin 0.3 - 1.2 mg/dL 0.8 - -  Alkaline Phos 38 - 126 U/L 52 - -  AST 15 - 41 U/L 20 - -  ALT 0 - 44 U/L 19 - -      RADIOGRAPHIC STUDIES: I have personally reviewed the radiological images as listed and agreed with the findings in the report. No results found.   ASSESSMENT & PLAN:  Bridget Grant is a 74 y.o. female with    1.GastricGIST, cTxN0Mx, High grade,  Kit mutation (+)  -She was diagnosed in 01/2020 with High grade gastrointestinal stromal tumor.  -Her staging CT CAP from 01/13/20 did not show gastric mass (I think the greater curvature of gastric wall is thickened)but did show incidental findings of left breast mass and 6.22m sclerotic lesion at T7.  -Given her bleeding tumor, large size and other work up, I started her on Gleevec 4059mon 01/27/20, prior to surgery.  -She underwent surgery on 02/29/20 with Dr ByBarry DienesSurgical path shows high-grade GIST spanning 8 cm infiltrating through most of the distal gastric wall with a positive distal resection margin, as was staged pT3. Pathology also shows areas of rhabdomyosarcomatous differentiation but is not felt to represent a mixed rhabdomyosarcoma/GIST tumor.  Unfortunately, given these features she is at very high risk for recurrence and metastasis.  -She underwent EUS on 04/13/20 which showed no evidence of malignancy or GIST. Although biopsy does not show GIST, Dr JaArdis Hughsas concern for residual disease. I personally reviewed with patient. -pt is very reluctant to have second surgery, she plans to meet Dr. ByBarry Dienesor further discussion   -I recommend continuing Gleevec 40076mnce daily, likely indefinitely. I encouraged her to consider second surgery for probable residual disease. If she does not proceed with surgery, will monitor her with scan every 6-12 months and repeat endoscopy every 4-6 months.  -Labs reviewed. F/u in 2 months   2. Anemia secondary to GI bleeding from #1, B12 deficiency  -She was hospitalized on 01/12/20 due to severe anemia with Hg 7.9.  -She was treated with blood transfusion on 01/17/20 and 03/06/20 and IV Feraheme on 01/15/20 and 02/09/20 -She was on Monthly B12 injections 05/2019-11/2019. She restarted on 04/14/20 after stomach surgery.   -I also recommend she start prenatal vitamin OTC.   3. Left Breast Mass -Her 01/13/20 CT scan showed 2.1cm left breast mass. She has not had  mammogram in several years.  -Her diagnostic mammogram and ultrasound was negative except a 1 cm cyst in the left breast  4. T7 Spinal Lesion  -Her 01/13/20 CT scan also shows a6.5 mm sclerotic focus within the posterior aspect of the T7 vertebral body. -PET scan was negative for bone mets    5. Social Support -She lives with her husband and has no children.  -Her husband is paralyzed from tumor on spine. She is his caregiver and he is able to get around by powered chair.  -She has support from her  sister-in-law and brother.  -If high co-pay for Gleevec I reviewed [facility] resources that can help her gain financial assistance.  6. LE Edema  -She notes her occasional LE edema has progressed bilaterally, now moderate.  -I recommend she elevate her feet and use compression socks. I not enough will given Lasix.    PLAN: -Proceed with B12 injection today  -Continue Gleevec 423m once daily  -Lab and B12 injection every 2 weeks X4 then monthly after  -F/u in 2 months  -pt will /u with Dr. BBarry Dienes   No problem-specific Assessment & Plan notes found for this encounter.   No orders of the defined types were placed in this encounter.  All questions were answered. The patient knows to call the clinic with any problems, questions or concerns. No barriers to learning was detected. The total time spent in the appointment was 30 minutes.     YTruitt Merle MD 04/14/2020   I, AJoslyn Devon am acting as scribe for YTruitt Merle MD.   I have reviewed the above documentation for accuracy and completeness, and I agree with the above.

## 2020-04-13 ENCOUNTER — Encounter (HOSPITAL_COMMUNITY)
Admission: RE | Disposition: A | Discharge status: Home / Self Care | Payer: Self-pay | Source: Home / Self Care | Attending: Gastroenterology

## 2020-04-13 ENCOUNTER — Ambulatory Visit (HOSPITAL_COMMUNITY): Payer: Medicare Other | Admitting: Certified Registered Nurse Anesthetist

## 2020-04-13 ENCOUNTER — Encounter (HOSPITAL_COMMUNITY): Payer: Self-pay | Admitting: Gastroenterology

## 2020-04-13 ENCOUNTER — Ambulatory Visit (HOSPITAL_COMMUNITY)
Admission: RE | Admit: 2020-04-13 | Discharge: 2020-04-13 | Disposition: A | Discharge status: Home / Self Care | Payer: Medicare Other | Attending: Gastroenterology | Admitting: Gastroenterology

## 2020-04-13 DIAGNOSIS — K319 Disease of stomach and duodenum, unspecified: Secondary | ICD-10-CM | POA: Insufficient documentation

## 2020-04-13 DIAGNOSIS — D62 Acute posthemorrhagic anemia: Secondary | ICD-10-CM | POA: Diagnosis not present

## 2020-04-13 DIAGNOSIS — C49A2 Gastrointestinal stromal tumor of stomach: Secondary | ICD-10-CM | POA: Diagnosis present

## 2020-04-13 DIAGNOSIS — K2970 Gastritis, unspecified, without bleeding: Secondary | ICD-10-CM | POA: Insufficient documentation

## 2020-04-13 DIAGNOSIS — C49A0 Gastrointestinal stromal tumor, unspecified site: Secondary | ICD-10-CM

## 2020-04-13 DIAGNOSIS — E119 Type 2 diabetes mellitus without complications: Secondary | ICD-10-CM | POA: Diagnosis not present

## 2020-04-13 DIAGNOSIS — K3189 Other diseases of stomach and duodenum: Secondary | ICD-10-CM | POA: Diagnosis not present

## 2020-04-13 DIAGNOSIS — I10 Essential (primary) hypertension: Secondary | ICD-10-CM | POA: Diagnosis not present

## 2020-04-13 HISTORY — PX: ESOPHAGOGASTRODUODENOSCOPY (EGD) WITH PROPOFOL: SHX5813

## 2020-04-13 HISTORY — PX: BIOPSY: SHX5522

## 2020-04-13 HISTORY — PX: EUS: SHX5427

## 2020-04-13 LAB — GLUCOSE, CAPILLARY: Glucose-Capillary: 143 mg/dL — ABNORMAL HIGH (ref 70–99)

## 2020-04-13 SURGERY — UPPER ENDOSCOPIC ULTRASOUND (EUS) LINEAR
Anesthesia: Monitor Anesthesia Care

## 2020-04-13 MED ORDER — SODIUM CHLORIDE 0.9 % IV SOLN: INTRAVENOUS | Status: DC

## 2020-04-13 MED ORDER — PROPOFOL 500 MG/50ML IV EMUL
INTRAVENOUS | Status: DC | PRN
  Administered 2020-04-13: 150 ug/kg/min via INTRAVENOUS

## 2020-04-13 MED ORDER — ONDANSETRON HCL 4 MG/2ML IJ SOLN
INTRAMUSCULAR | Status: DC | PRN
  Administered 2020-04-13: 4 mg via INTRAVENOUS

## 2020-04-13 MED ORDER — LACTATED RINGERS IV SOLN: INTRAVENOUS | Status: DC | PRN

## 2020-04-13 NOTE — Transfer of Care (Signed)
Immediate Anesthesia Transfer of Care Note  Patient: Bridget Grant  Procedure(s) Performed: UPPER ENDOSCOPIC ULTRASOUND (EUS) LINEAR (N/A ) BIOPSY  Patient Location: PACU  Anesthesia Type:MAC  Level of Consciousness: sedated, patient cooperative and responds to stimulation  Airway & Oxygen Therapy: Patient Spontanous Breathing and Patient connected to face mask oxygen  Post-op Assessment: Report given to RN and Post -op Vital signs reviewed and stable  Post vital signs: Reviewed and stable  Last Vitals:  Vitals Value Taken Time  BP 131/104 04/13/20 0950  Temp    Pulse 73 04/13/20 0951  Resp 28 04/13/20 0951  SpO2 100 % 04/13/20 0951  Vitals shown include unvalidated device data.  Last Pain:  Vitals:   04/13/20 0800  TempSrc: Tympanic  PainSc: 0-No pain         Complications: No complications documented.

## 2020-04-13 NOTE — Op Note (Addendum)
[facility] Patient Name: Bridget Grant Procedure Date: 04/13/2020 MRN: [id number] Attending MD: Milus Banister , MD Date of Birth: 12/26/1945 CSN: [id number] Age: 74 Admit Type: Outpatient Procedure:                Upper EUS Indications:              High grade, inflitrative type gastric GIST;                            diagnosed by EGD 01/2020, s/p "wedge resection",                            partial gastrectomy Dr. Barry Dienes 02/2020 with positive                            distal margin, on [place] Providers:                Milus Banister, MD, Angus Seller, Elspeth Cho Tech., Technician, Lesia Sago,                            Technician, Herbie Drape, CRNA Referring MD:             Stark Klein, MD Medicines:                Monitored Anesthesia Care Complications:            No immediate complications. Estimated blood loss:                            None. Estimated Blood Loss:     Estimated blood loss: none. Procedure:                Pre-Anesthesia Assessment:                           - Prior to the procedure, a History and Physical                            was performed, and patient medications and                            allergies were reviewed. The patient's tolerance of                            previous anesthesia was also reviewed. The risks                            and benefits of the procedure and the sedation                            options and risks were discussed with the patient.                            All questions  were answered, and informed consent                            was obtained. Prior Anticoagulants: The patient has                            taken no previous anticoagulant or antiplatelet                            agents. ASA Grade Assessment: III - A patient with                            severe systemic disease. After reviewing the risks                            and benefits, the  patient was deemed in                            satisfactory condition to undergo the procedure.                           After obtaining informed consent, the endoscope was                            passed under direct vision. Throughout the                            procedure, the patient's blood pressure, pulse, and                            oxygen saturations were monitored continuously. The                            [id number]-[id number] ([id number]) Olympus Radial EUS was                            introduced through the mouth, and advanced to the                            second part of duodenum. The [id number]-[id number] ([id number])                            Olympus gastroscope was introduced through the                            mouth, and advanced to the second part of duodenum.                            The upper EUS was accomplished without difficulty.                            The patient tolerated the procedure well. Scope In: Scope Out: Findings:      ENDOSCOPIC FINDING: :      1.  The staple line from recent partial gastrectomy was noted in the       proximal stomach, runs longitudinally along the gastric fundus. The       mucosa immediately around the stable line was normal appearing however       in the more distal gastric body (anterior wall likely) the mucosa was       quite irregular appearing; friable, firm, slightly ulcerated for 4-5cm       at least. This was not a discrete tumor, and these changes were about       5cm from the pylorus. I sampled this region extensively with forceps       following EUS evaluation below.      2. The UGI tract was otherwise normal.      ENDOSONOGRAPHIC FINDING: :      1. The muscularis propria was abnormally thickened starting around the       staple line and continuing throughout most of the stomach distally. This       thickening was not circumferential.      2. No perigastric adenopathy.      3. Limited view of the pancreas, liver, spleen,  portal and splenic       vessels were all normal. Impression:               - The staple line from recent partial gastrectomy                            was noted in the proximal stomach, runs                            longitudinally along the gastric fundus. The mucosa                            immediately around the stable line was normal                            appearing however in the more distal gastric body                            (anterior wall likely) the mucosa was quite                            irregular appearing; friable, firm, slightly                            ulcerated for 4-5cm at least. This was not a                            discrete tumor, and these changes were about 5cm                            from the pylorus. I sampled this region extensively                            with forceps following EUS evaluation below.                           -  The muscularis propria was abnormally thickened                            starting around the staple line and continuing                            throughout most of the stomach distally. This                            thickening was not circumferential. Moderate Sedation:      Not Applicable - Patient had care per Anesthesia. Recommendation:           - Discharge patient to home (ambulatory).                           - I will communicate these findings with her                            oncology team.                           - Await final path results. Procedure Code(s):        --- Professional ---                           [phone], Esophagogastroduodenoscopy, flexible,                            transoral; with endoscopic ultrasound examination                            limited to the esophagus, stomach or duodenum, and                            adjacent structures                           43239, Esophagogastroduodenoscopy, flexible,                            transoral; with biopsy, single or  multiple Diagnosis Code(s):        --- Professional ---                           K29.70, Gastritis, unspecified, without bleeding                           K31.89, Other diseases of stomach and duodenum CPT copyright 2019 American Medical Association. All rights reserved. The codes documented in this report are preliminary and upon coder review may  be revised to meet current compliance requirements. Milus Banister, MD 04/13/2020 9:56:27 AM This report has been signed electronically. Number of Addenda: 0

## 2020-04-13 NOTE — Anesthesia Postprocedure Evaluation (Signed)
Anesthesia Post Note  Patient: Bridget Grant  Procedure(s) Performed: UPPER ENDOSCOPIC ULTRASOUND (EUS) LINEAR (N/A ) BIOPSY     Patient location during evaluation: PACU Anesthesia Type: MAC Level of consciousness: awake and alert Pain management: pain level controlled Vital Signs Assessment: post-procedure vital signs reviewed and stable Respiratory status: spontaneous breathing, nonlabored ventilation, respiratory function stable and patient connected to nasal cannula oxygen Cardiovascular status: stable and blood pressure returned to baseline Postop Assessment: no apparent nausea or vomiting Anesthetic complications: no   No complications documented.  Last Vitals:  Vitals:   04/13/20 1010 04/13/20 1020  BP: (!) 153/63 (!) 148/64  Pulse: 70 62  Resp: 17 17  Temp:    SpO2: 94% 93%    Last Pain:  Vitals:   04/13/20 1020  TempSrc:   PainSc: 0-No pain                 Darian Ace DAVID

## 2020-04-13 NOTE — Interval H&P Note (Signed)
History and Physical Interval Note:  04/13/2020 8:20 AM  Bridget Grant  has presented today for surgery, with the diagnosis of gastric mass.  The various methods of treatment have been discussed with the patient and family. After consideration of risks, benefits and other options for treatment, the patient has consented to  Procedure(s): UPPER ENDOSCOPIC ULTRASOUND (EUS) LINEAR (N/A) as a surgical intervention.  The patient's history has been reviewed, patient examined, no change in status, stable for surgery.  I have reviewed the patient's chart and labs.  Questions were answered to the patient's satisfaction.     Milus Banister

## 2020-04-13 NOTE — Anesthesia Preprocedure Evaluation (Signed)
Anesthesia Evaluation  Patient identified by MRN, date of birth, ID band Patient awake    Reviewed: Allergy & Precautions, NPO status , Patient's Chart, lab work & pertinent test results  History of Anesthesia Complications (+) PONV  Airway Mallampati: I  TM Distance: >3 FB Neck ROM: Full    Dental   Pulmonary    Pulmonary exam normal        Cardiovascular hypertension, Pt. on medications Normal cardiovascular exam     Neuro/Psych Anxiety Depression    GI/Hepatic   Endo/Other  diabetes, Type 2, Oral Hypoglycemic Agents  Renal/GU      Musculoskeletal   Abdominal   Peds  Hematology   Anesthesia Other Findings   Reproductive/Obstetrics                             Anesthesia Physical Anesthesia Plan  ASA: II  Anesthesia Plan: MAC   Post-op Pain Management:    Induction: Intravenous  PONV Risk Score and Plan: 3 and Midazolam, Ondansetron and Treatment may vary due to age or medical condition  Airway Management Planned: Nasal Cannula  Additional Equipment:   Intra-op Plan:   Post-operative Plan:   Informed Consent: I have reviewed the patients History and Physical, chart, labs and discussed the procedure including the risks, benefits and alternatives for the proposed anesthesia with the patient or authorized representative who has indicated his/her understanding and acceptance.       Plan Discussed with: CRNA and Surgeon  Anesthesia Plan Comments:         Anesthesia Quick Evaluation

## 2020-04-13 NOTE — Discharge Instructions (Signed)
YOU HAD AN ENDOSCOPIC PROCEDURE TODAY: Refer to the procedure report and other information in the discharge instructions given to you for any specific questions about what was found during the examination. If this information does not answer your questions, please call [place] office at [phone] to clarify.   YOU SHOULD EXPECT: Some feelings of bloating in the abdomen. Passage of more gas than usual. Walking can help get rid of the air that was put into your GI tract during the procedure and reduce the bloating. If you had a lower endoscopy (such as a colonoscopy or flexible sigmoidoscopy) you may notice spotting of blood in your stool or on the toilet paper. Some abdominal soreness may be present for a day or two, also.  DIET: Your first meal following the procedure should be a light meal and then it is ok to progress to your normal diet. A half-sandwich or bowl of soup is an example of a good first meal. Heavy or fried foods are harder to digest and may make you feel nauseous or bloated. Drink plenty of fluids but you should avoid alcoholic beverages for 24 hours. If you had a esophageal dilation, please see attached instructions for diet.    ACTIVITY: Your care partner should take you home directly after the procedure. You should plan to take it easy, moving slowly for the rest of the day. You can resume normal activity the day after the procedure however YOU SHOULD NOT DRIVE, use power tools, machinery or perform tasks that involve climbing or major physical exertion for 24 hours (because of the sedation medicines used during the test).   SYMPTOMS TO REPORT IMMEDIATELY: A gastroenterologist can be reached at any hour. Please call [phone]  for any of the following symptoms:   Following upper endoscopy (EGD, EUS, ERCP, esophageal dilation) Vomiting of blood or coffee ground material  New, significant abdominal pain  New, significant chest pain or pain under the shoulder blades  Painful or  persistently difficult swallowing  New shortness of breath  Black, tarry-looking or red, bloody stools  FOLLOW UP:  If any biopsies were taken you will be contacted by phone or by letter within the next 1-3 weeks. Call [phone]  if you have not heard about the biopsies in 3 weeks.  Please also call with any specific questions about appointments or follow up tests.  

## 2020-04-14 ENCOUNTER — Other Ambulatory Visit: Payer: Self-pay

## 2020-04-14 ENCOUNTER — Encounter (HOSPITAL_COMMUNITY): Payer: Self-pay | Admitting: Gastroenterology

## 2020-04-14 ENCOUNTER — Inpatient Hospital Stay (HOSPITAL_BASED_OUTPATIENT_CLINIC_OR_DEPARTMENT_OTHER): Payer: Medicare Other | Admitting: Hematology

## 2020-04-14 ENCOUNTER — Inpatient Hospital Stay: Payer: Medicare Other | Attending: Hematology

## 2020-04-14 ENCOUNTER — Inpatient Hospital Stay: Payer: Medicare Other

## 2020-04-14 VITALS — BP 156/72 | HR 87 | Temp 99.8°F | Resp 20 | Ht 62.0 in | Wt 194.7 lb

## 2020-04-14 DIAGNOSIS — D500 Iron deficiency anemia secondary to blood loss (chronic): Secondary | ICD-10-CM | POA: Diagnosis not present

## 2020-04-14 DIAGNOSIS — C49A2 Gastrointestinal stromal tumor of stomach: Secondary | ICD-10-CM | POA: Diagnosis not present

## 2020-04-14 DIAGNOSIS — E538 Deficiency of other specified B group vitamins: Secondary | ICD-10-CM

## 2020-04-14 LAB — CBC WITH DIFFERENTIAL (CANCER CENTER ONLY)
Abs Immature Granulocytes: 0.03 10*3/uL (ref 0.00–0.07)
Basophils Absolute: 0 10*3/uL (ref 0.0–0.1)
Basophils Relative: 1 %
Eosinophils Absolute: 0.2 10*3/uL (ref 0.0–0.5)
Eosinophils Relative: 4 %
HCT: 29.3 % — ABNORMAL LOW (ref 36.0–46.0)
Hemoglobin: 9.6 g/dL — ABNORMAL LOW (ref 12.0–15.0)
Immature Granulocytes: 1 %
Lymphocytes Relative: 25 %
Lymphs Abs: 1.3 10*3/uL (ref 0.7–4.0)
MCH: 32 pg (ref 26.0–34.0)
MCHC: 32.8 g/dL (ref 30.0–36.0)
MCV: 97.7 fL (ref 80.0–100.0)
Monocytes Absolute: 0.8 10*3/uL (ref 0.1–1.0)
Monocytes Relative: 16 %
Neutro Abs: 2.8 10*3/uL (ref 1.7–7.7)
Neutrophils Relative %: 53 %
Platelet Count: 235 10*3/uL (ref 150–400)
RBC: 3 MIL/uL — ABNORMAL LOW (ref 3.87–5.11)
RDW: 15.9 % — ABNORMAL HIGH (ref 11.5–15.5)
WBC Count: 5.2 10*3/uL (ref 4.0–10.5)
nRBC: 0 % (ref 0.0–0.2)

## 2020-04-14 LAB — RETIC PANEL
Immature Retic Fract: 17.7 % — ABNORMAL HIGH (ref 2.3–15.9)
RBC.: 3.04 MIL/uL — ABNORMAL LOW (ref 3.87–5.11)
Retic Count, Absolute: 47.1 10*3/uL (ref 19.0–186.0)
Retic Ct Pct: 1.6 % (ref 0.4–3.1)
Reticulocyte Hemoglobin: 38.1 pg (ref >27.9)

## 2020-04-14 LAB — CMP (CANCER CENTER ONLY)
ALT: 14 U/L (ref 0–44)
AST: 14 U/L — ABNORMAL LOW (ref 15–41)
Albumin: 3.5 g/dL (ref 3.5–5.0)
Alkaline Phosphatase: 67 U/L (ref 38–126)
Anion gap: 9 (ref 5–15)
BUN: 9 mg/dL (ref 8–23)
CO2: 28 mmol/L (ref 22–32)
Calcium: 8.8 mg/dL — ABNORMAL LOW (ref 8.9–10.3)
Chloride: 104 mmol/L (ref 98–111)
Creatinine: 0.85 mg/dL (ref 0.44–1.00)
GFR, Estimated: >60 mL/min (ref >60)
Glucose, Bld: 140 mg/dL — ABNORMAL HIGH (ref 70–99)
Potassium: 3.3 mmol/L — ABNORMAL LOW (ref 3.5–5.1)
Sodium: 141 mmol/L (ref 135–145)
Total Bilirubin: 0.7 mg/dL (ref 0.3–1.2)
Total Protein: 6.6 g/dL (ref 6.5–8.1)

## 2020-04-14 LAB — SURGICAL PATHOLOGY

## 2020-04-14 MED ORDER — CYANOCOBALAMIN 1000 MCG/ML IJ SOLN
1000.0000 ug | Freq: Once | INTRAMUSCULAR | Status: AC
  Administered 2020-04-14: 1000 ug via INTRAMUSCULAR

## 2020-04-14 MED ORDER — CYANOCOBALAMIN 1000 MCG/ML IJ SOLN
INTRAMUSCULAR | Status: AC
  Filled 2020-04-14: qty 1

## 2020-04-14 NOTE — Patient Instructions (Signed)
Cyanocobalamin, Pyridoxine, and Folate What is this medicine? A multivitamin containing folic acid, vitamin B6, and vitamin B12. This medicine may be used for other purposes; ask your health care provider or pharmacist if you have questions. COMMON BRAND NAME(S): AllanFol RX, AllanTex, Av-Vite FB, B Complex with Folic Acid, ComBgen, FaBB, Folamin, Folastin, Folbalin, Folbee, Folbic, Folcaps, Folgard, Folgard RX, Folgard RX 2.2, Folplex, Folplex 2.2, Foltabs 800, Foltx, Homocysteine Formula, Niva-Fol, NuFol, TL Gard RX, Virt-Gard, Virt-Vite, Virt-Vite Forte, Vita-Respa What should I tell my health care provider before I take this medicine? They need to know if you have any of these conditions:  bleeding or clotting disorder  history of anemia of any type  other chronic health condition  an unusual or allergic reaction to vitamins, other medicines, foods, dyes, or preservatives  pregnant or trying to get pregnant  breast-feeding How should I use this medicine? Take by mouth with a glass of water. May take with food. Follow the directions on the prescription label. It is usually given once a day. Do not take your medicine more often than directed. Contact your pediatrician regarding the use of this medicine in children. Special care may be needed. Overdosage: If you think you have taken too much of this medicine contact a poison control center or emergency room at once. NOTE: This medicine is only for you. Do not share this medicine with others. What if I miss a dose? If you miss a dose, take it as soon as you can. If it is almost time for your next dose, take only that dose. Do not take double or extra doses. What may interact with this medicine?  levodopa This list may not describe all possible interactions. Give your health care provider a list of all the medicines, herbs, non-prescription drugs, or dietary supplements you use. Also tell them if you smoke, drink alcohol, or use illegal  drugs. Some items may interact with your medicine. What should I watch for while using this medicine? See your health care professional for regular checks on your progress. Remember that vitamin supplements do not replace the need for good nutrition from a balanced diet. What side effects may I notice from receiving this medicine? Side effects that you should report to your doctor or health care professional as soon as possible:  allergic reaction such as skin rash or difficulty breathing  vomiting Side effects that usually do not require medical attention (report to your doctor or health care professional if they continue or are bothersome):  nausea  stomach upset This list may not describe all possible side effects. Call your doctor for medical advice about side effects. You may report side effects to FDA at 1-800-FDA-1088. Where should I keep my medicine? Keep out of the reach of children. Most vitamins should be stored at controlled room temperature. Check your specific product directions. Protect from heat and moisture. Throw away any unused medicine after the expiration date. NOTE: This sheet is a summary. It may not cover all possible information. If you have questions about this medicine, talk to your doctor, pharmacist, or health care provider.  2020 Elsevier/Gold Standard (2007-07-18 00:59:55)  

## 2020-04-15 ENCOUNTER — Encounter: Payer: Self-pay | Admitting: Hematology

## 2020-04-17 ENCOUNTER — Telehealth: Payer: Self-pay | Admitting: Hematology

## 2020-04-17 LAB — IRON AND TIBC
Iron: 64 ug/dL (ref 41–142)
Saturation Ratios: 31 % (ref 21–57)
TIBC: 206 ug/dL — ABNORMAL LOW (ref 236–444)
UIBC: 142 ug/dL (ref 120–384)

## 2020-04-17 LAB — FERRITIN: Ferritin: 724 ng/mL — ABNORMAL HIGH (ref 11–307)

## 2020-04-17 NOTE — Telephone Encounter (Signed)
Scheduled per 11/5 los. Pt is aware of appt times and dates

## 2020-04-26 ENCOUNTER — Other Ambulatory Visit: Payer: Self-pay

## 2020-04-28 ENCOUNTER — Other Ambulatory Visit: Payer: Self-pay

## 2020-04-28 ENCOUNTER — Inpatient Hospital Stay: Payer: Medicare Other

## 2020-04-28 VITALS — BP 172/76 | HR 74 | Resp 18

## 2020-04-28 DIAGNOSIS — D500 Iron deficiency anemia secondary to blood loss (chronic): Secondary | ICD-10-CM

## 2020-04-28 DIAGNOSIS — E538 Deficiency of other specified B group vitamins: Secondary | ICD-10-CM | POA: Diagnosis not present

## 2020-04-28 DIAGNOSIS — C49A2 Gastrointestinal stromal tumor of stomach: Secondary | ICD-10-CM | POA: Diagnosis not present

## 2020-04-28 MED ORDER — CYANOCOBALAMIN 1000 MCG/ML IJ SOLN
1000.0000 ug | Freq: Once | INTRAMUSCULAR | Status: AC
  Administered 2020-04-28: 1000 ug via INTRAMUSCULAR

## 2020-04-28 MED ORDER — CYANOCOBALAMIN 1000 MCG/ML IJ SOLN
INTRAMUSCULAR | Status: AC
  Filled 2020-04-28: qty 1

## 2020-05-12 ENCOUNTER — Other Ambulatory Visit: Payer: Self-pay

## 2020-05-12 ENCOUNTER — Inpatient Hospital Stay: Payer: Medicare Other | Attending: Hematology

## 2020-05-12 VITALS — BP 163/64 | HR 75 | Resp 18

## 2020-05-12 DIAGNOSIS — E538 Deficiency of other specified B group vitamins: Secondary | ICD-10-CM | POA: Diagnosis not present

## 2020-05-12 DIAGNOSIS — C49A2 Gastrointestinal stromal tumor of stomach: Secondary | ICD-10-CM | POA: Insufficient documentation

## 2020-05-12 DIAGNOSIS — D500 Iron deficiency anemia secondary to blood loss (chronic): Secondary | ICD-10-CM | POA: Diagnosis not present

## 2020-05-12 MED ORDER — CYANOCOBALAMIN 1000 MCG/ML IJ SOLN
1000.0000 ug | Freq: Once | INTRAMUSCULAR | Status: AC
  Administered 2020-05-12: 1000 ug via INTRAMUSCULAR

## 2020-05-12 MED ORDER — CYANOCOBALAMIN 1000 MCG/ML IJ SOLN
INTRAMUSCULAR | Status: AC
  Filled 2020-05-12: qty 1

## 2020-05-12 NOTE — Patient Instructions (Signed)
Cyanocobalamin, Pyridoxine, and Folate What is this medicine? A multivitamin containing folic acid, vitamin B6, and vitamin B12. This medicine may be used for other purposes; ask your health care provider or pharmacist if you have questions. COMMON BRAND NAME(S): AllanFol RX, AllanTex, Av-Vite FB, B Complex with Folic Acid, ComBgen, FaBB, Folamin, Folastin, Folbalin, Folbee, Folbic, Folcaps, Folgard, Folgard RX, Folgard RX 2.2, Folplex, Folplex 2.2, Foltabs 800, Foltx, Homocysteine Formula, Niva-Fol, NuFol, TL Gard RX, Virt-Gard, Virt-Vite, Virt-Vite Forte, Vita-Respa What should I tell my health care provider before I take this medicine? They need to know if you have any of these conditions:  bleeding or clotting disorder  history of anemia of any type  other chronic health condition  an unusual or allergic reaction to vitamins, other medicines, foods, dyes, or preservatives  pregnant or trying to get pregnant  breast-feeding How should I use this medicine? Take by mouth with a glass of water. May take with food. Follow the directions on the prescription label. It is usually given once a day. Do not take your medicine more often than directed. Contact your pediatrician regarding the use of this medicine in children. Special care may be needed. Overdosage: If you think you have taken too much of this medicine contact a poison control center or emergency room at once. NOTE: This medicine is only for you. Do not share this medicine with others. What if I miss a dose? If you miss a dose, take it as soon as you can. If it is almost time for your next dose, take only that dose. Do not take double or extra doses. What may interact with this medicine?  levodopa This list may not describe all possible interactions. Give your health care provider a list of all the medicines, herbs, non-prescription drugs, or dietary supplements you use. Also tell them if you smoke, drink alcohol, or use illegal  drugs. Some items may interact with your medicine. What should I watch for while using this medicine? See your health care professional for regular checks on your progress. Remember that vitamin supplements do not replace the need for good nutrition from a balanced diet. What side effects may I notice from receiving this medicine? Side effects that you should report to your doctor or health care professional as soon as possible:  allergic reaction such as skin rash or difficulty breathing  vomiting Side effects that usually do not require medical attention (report to your doctor or health care professional if they continue or are bothersome):  nausea  stomach upset This list may not describe all possible side effects. Call your doctor for medical advice about side effects. You may report side effects to FDA at 1-800-FDA-1088. Where should I keep my medicine? Keep out of the reach of children. Most vitamins should be stored at controlled room temperature. Check your specific product directions. Protect from heat and moisture. Throw away any unused medicine after the expiration date. NOTE: This sheet is a summary. It may not cover all possible information. If you have questions about this medicine, talk to your doctor, pharmacist, or health care provider.  2020 Elsevier/Gold Standard (2007-07-18 00:59:55)  

## 2020-05-17 ENCOUNTER — Ambulatory Visit (INDEPENDENT_AMBULATORY_CARE_PROVIDER_SITE_OTHER): Payer: Medicare Other | Admitting: Internal Medicine

## 2020-05-17 ENCOUNTER — Encounter: Payer: Self-pay | Admitting: Internal Medicine

## 2020-05-17 ENCOUNTER — Other Ambulatory Visit: Payer: Self-pay

## 2020-05-17 VITALS — BP 130/70 | HR 73 | Temp 98.3°F | Ht 62.0 in | Wt 194.0 lb

## 2020-05-17 DIAGNOSIS — E119 Type 2 diabetes mellitus without complications: Secondary | ICD-10-CM

## 2020-05-17 DIAGNOSIS — E7849 Other hyperlipidemia: Secondary | ICD-10-CM | POA: Diagnosis not present

## 2020-05-17 DIAGNOSIS — I10 Essential (primary) hypertension: Secondary | ICD-10-CM | POA: Diagnosis not present

## 2020-05-17 DIAGNOSIS — I700 Atherosclerosis of aorta: Secondary | ICD-10-CM

## 2020-05-17 LAB — POCT GLYCOSYLATED HEMOGLOBIN (HGB A1C): Hemoglobin A1C: 5.9 % — AB (ref 4.0–5.6)

## 2020-05-17 NOTE — Progress Notes (Signed)
Subjective:    Patient ID: Bridget Grant, female    DOB: 01-25-1946, 74 y.o.   MRN: [id number]  HPI  Here to f/u; overall doing ok,  Pt denies chest pain, increasing sob or doe, wheezing, orthopnea, PND, increased LE swelling, palpitations, dizziness or syncope.  Pt denies new neurological symptoms such as new headache, or facial or extremity weakness or numbness.  Pt denies polydipsia, polyuria, or low sugar episode.  Pt states overall good compliance with meds, mostly trying to follow appropriate diet, with wt overall stable,  but little exercise however. Plans to see eye doctor in the spring.   Wt Readings from Last 3 Encounters:  05/17/20 194 lb (88 kg)  04/14/20 194 lb 11.2 oz (88.3 kg)  04/13/20 191 lb (86.6 kg)   Past Medical History:  Diagnosis Date  . Anxiety   . Cancer ([place]) 2021   stomach  . Complication of anesthesia   . Depression   . Hyperlipidemia   . Hypertension   . Migraine   . Osteopenia   . PONV (postoperative nausea and vomiting)   . Type II or unspecified type diabetes mellitus without mention of complication, uncontrolled 04/09/2013  . Upper GI bleed 01/13/2020   Past Surgical History:  Procedure Laterality Date  . ABDOMINAL HYSTERECTOMY    . APPENDECTOMY    . BIOPSY  01/13/2020   Procedure: BIOPSY;  Surgeon: Milus Banister, MD;  Location[facility] END[facility]COPY;  Service: Endoscopy;;  . BIOPSY  04/13/2020   Procedure: BIOPSY;  Surgeon: Milus Banister, MD;  Location: [facility] ENDOSCOPY;  Service: Endoscopy;;  . CHOLECYSTECTOMY    . ESOPHAGOGASTRODUODENOSCOPY (EGD) WITH PROPOFOL N/A 01/13/2020   Procedure: ESOPHAGOGASTRODUODENOSCOPY (EGD) WITH PROPOFOL;  Surgeon: Milus Banister, MD;  Location[facility] ENDOSCOPY;  Service: Endoscopy;  Laterality: N/A;  . ESOPHAGOGASTRODUODENOSCOPY (EGD) WITH PROPOFOL N/A 04/13/2020   Procedure: ESOPHAGOGASTRODUODENOSCOPY (EGD) WITH PROPOFOL;  Surgeon: Milus Banister, MD;  Location: [facility] ENDOSCOPY;  Service: Endoscopy;  Laterality: N/A;  .  EUS N/A 04/13/2020   Procedure: UPPER ENDOSCOPIC ULTRASOUND (EUS) LINEAR;  Surgeon: Milus Banister, MD;  Location: [facility] ENDOSCOPY;  Service: Endoscopy;  Laterality: N/A;  . PARTIAL GASTRECTOMY  02/28/2020    reports that she has never smoked. She has never used smokeless tobacco. She reports that she does not drink alcohol and does not use drugs. family history includes Breast cancer (age of onset: 58) in her mother; Cancer in her maternal grandmother; Cancer (age of onset: 82) in her mother; Hypertension in her father. No Known Allergies Current Outpatient Medications on File Prior to Visit  Medication Sig Dispense Refill  . atorvastatin (LIPITOR) 20 MG tablet TAKE 1 TABLET BY MOUTH EVERY DAY (Patient taking differently: Take 20 mg by mouth daily. ) 90 tablet 3  . diltiazem (CARDIZEM CD) 240 MG 24 hr capsule TAKE 1 CAPSULE(240 MG) BY MOUTH DAILY (Patient taking differently: Take 240 mg by mouth daily. ) 90 capsule 3  . imatinib (GLEEVEC) 400 MG tablet TAKE 1 TABLET (400 MG TOTAL) BY MOUTH DAILY . TAKE WITH MEALS AND A LARGE GLASS OF WATER. (Patient taking differently: Take 400 mg by mouth daily. ) 30 tablet 4  . lisinopril (ZESTRIL) 20 MG tablet Take 0.5 tablets (10 mg total) by mouth daily. 45 tablet 3  . metFORMIN (GLUCOPHAGE-XR) 500 MG 24 hr tablet Take 2 tablets (1,000 mg total) by mouth daily with breakfast. 180 tablet 3  . ondansetron (ZOFRAN ODT) 8 MG disintegrating tablet Take 1 tablet (  8 mg total) by mouth every 8 (eight) hours as needed for nausea or vomiting. 30 tablet 2  . oxyCODONE (OXY IR/ROXICODONE) 5 MG immediate release tablet Take 1-2 tablets (5-10 mg total) by mouth every 4 (four) hours as needed for moderate pain. 30 tablet 0  . pantoprazole (PROTONIX) 40 MG tablet TAKE 1 TABLET(40 MG) BY MOUTH TWICE DAILY BEFORE A MEAL (Patient taking differently: Take 40 mg by mouth 2 (two) times daily before a meal. TAKE 1 TABLET(40 MG) BY MOUTH TWICE DAILY BEFORE A MEAL) 180 tablet 1  .  [DISCONTINUED] diltiazem (TIAZAC) 240 MG 24 hr capsule Take 1 capsule (240 mg total) by mouth daily. 90 capsule 3   No current facility-administered medications on file prior to visit.   Review of Systems All otherwise neg per pt    Objective:   Physical Exam BP 130/70 (BP Location: Left Arm, Patient Position: Sitting, Cuff Size: Large)   Pulse 73   Temp 98.3 F (36.8 C) (Oral)   Ht 5\' 2"  (1.575 m)   Wt 194 lb (88 kg)   SpO2 92%   BMI 35.48 kg/m  VS noted,  Constitutional: Pt appears in NAD HENT: Head: NCAT.  Right Ear: External ear normal.  Left Ear: External ear normal.  Eyes: . Pupils are equal, round, and reactive to light. Conjunctivae and EOM are normal Nose: without d/c or deformity Neck: Neck supple. Gross normal ROM Cardiovascular: Normal rate and regular rhythm.   Pulmonary/Chest: Effort normal and breath sounds without rales or wheezing.  Abd:  Soft, NT, ND, + BS, no organomegaly Neurological: Pt is alert. At baseline orientation, motor grossly intact Skin: Skin is warm. No rashes, other new lesions, no LE edema Psychiatric: Pt behavior is normal without agitation  All otherwise neg per pt Lab Results  Component Value Date   WBC 5.2 04/14/2020   HGB 9.6 (L) 04/14/2020   HCT 29.3 (L) 04/14/2020   PLT 235 04/14/2020   GLUCOSE 140 (H) 04/14/2020   CHOL 142 11/16/2019   TRIG 192.0 (H) 11/16/2019   HDL 41.90 11/16/2019   LDLDIRECT 85.0 10/16/2016   LDLCALC 62 11/16/2019   ALT 14 04/14/2020   AST 14 (L) 04/14/2020   NA 141 04/14/2020   K 3.3 (L) 04/14/2020   CL 104 04/14/2020   CREATININE 0.85 04/14/2020   BUN 9 04/14/2020   CO2 28 04/14/2020   TSH 2.21 11/16/2019   INR 0.9 01/11/2020   HGBA1C 5.9 (A) 05/17/2020   MICROALBUR 1.1 11/16/2019   Contains abnormal dataPOCT glycosylated hemoglobin (Hb A1C) Order: [id number] Status:  Final result Visible to patient:  Yes (not seen) Dx:  Type 2 diabetes mellitus without comp...  0 Result Notes   1 HM  Topic  Ref Range & Units 15:32  (05/17/20) 2 mo ago  (02/23/20) 6 mo ago  (11/16/19) 1 yr ago  (05/11/19) 1 yr ago  (10/26/18) 2 yr ago  (04/27/18) 2 yr ago  (10/21/17)  Hemoglobin A1C 4.0 - 5.6 % 5.9Abnormal  5.6 R, CM  8.6High R, CM  6.8High R, CM  8.3High R, CM  7.4High R, CM  8.           Assessment & Plan:

## 2020-05-17 NOTE — Patient Instructions (Addendum)
Ok to cancel the Feb 17 appt  Your A1c was OK today  Please continue all other medications as before, and refills have been done if requested.  Please have the pharmacy call with any other refills you may need.  Please continue your efforts at being more active, low cholesterol diet, and weight control.  Please keep your appointments with your specialists as you may have planned  Please make an Appointment to return in 6 months, or sooner if needed,

## 2020-05-20 ENCOUNTER — Encounter: Payer: Self-pay | Admitting: Internal Medicine

## 2020-05-20 NOTE — Assessment & Plan Note (Addendum)
stable overall by history and exam, recent data reviewed with pt, and pt to continue medical treatment as before,  to f/u any worsening symptoms or concerns  I spent 31 minutes in preparing to see the patient by review of recent labs, imaging and procedures, obtaining and reviewing separately obtained history, communicating with the patient and family or caregiver, ordering medications, tests or procedures, and documenting clinical information in the EHR including the differential Dx, treatment, and any further evaluation and other management of dm, htn, hld, aortic atherosclerosis

## 2020-05-20 NOTE — Assessment & Plan Note (Signed)
To continue statin

## 2020-05-20 NOTE — Assessment & Plan Note (Signed)
stable overall by history and exam, recent data reviewed with pt, and pt to continue medical treatment as before,  to f/u any worsening symptoms or concerns  

## 2020-05-26 ENCOUNTER — Other Ambulatory Visit: Payer: Self-pay

## 2020-05-26 ENCOUNTER — Inpatient Hospital Stay: Payer: Medicare Other

## 2020-05-26 VITALS — BP 162/57 | HR 73 | Temp 98.8°F | Resp 18

## 2020-05-26 DIAGNOSIS — C49A2 Gastrointestinal stromal tumor of stomach: Secondary | ICD-10-CM | POA: Diagnosis not present

## 2020-05-26 DIAGNOSIS — E538 Deficiency of other specified B group vitamins: Secondary | ICD-10-CM | POA: Diagnosis not present

## 2020-05-26 DIAGNOSIS — D500 Iron deficiency anemia secondary to blood loss (chronic): Secondary | ICD-10-CM | POA: Diagnosis not present

## 2020-05-26 MED ORDER — CYANOCOBALAMIN 1000 MCG/ML IJ SOLN
1000.0000 ug | Freq: Once | INTRAMUSCULAR | Status: AC
  Administered 2020-05-26: 1000 ug via INTRAMUSCULAR

## 2020-05-26 MED ORDER — CYANOCOBALAMIN 1000 MCG/ML IJ SOLN
INTRAMUSCULAR | Status: AC
  Filled 2020-05-26: qty 1

## 2020-05-26 NOTE — Patient Instructions (Signed)
Cyanocobalamin, Pyridoxine, and Folate What is this medicine? A multivitamin containing folic acid, vitamin B6, and vitamin B12. This medicine may be used for other purposes; ask your health care provider or pharmacist if you have questions. COMMON BRAND NAME(S): AllanFol RX, AllanTex, Av-Vite FB, B Complex with Folic Acid, ComBgen, FaBB, Folamin, Folastin, Folbalin, Folbee, Folbic, Folcaps, Folgard, Folgard RX, Folgard RX 2.2, Folplex, Folplex 2.2, Foltabs 800, Foltx, Homocysteine Formula, Niva-Fol, NuFol, TL Gard RX, Virt-Gard, Virt-Vite, Virt-Vite Forte, Vita-Respa What should I tell my health care provider before I take this medicine? They need to know if you have any of these conditions:  bleeding or clotting disorder  history of anemia of any type  other chronic health condition  an unusual or allergic reaction to vitamins, other medicines, foods, dyes, or preservatives  pregnant or trying to get pregnant  breast-feeding How should I use this medicine? Take by mouth with a glass of water. May take with food. Follow the directions on the prescription label. It is usually given once a day. Do not take your medicine more often than directed. Contact your pediatrician regarding the use of this medicine in children. Special care may be needed. Overdosage: If you think you have taken too much of this medicine contact a poison control center or emergency room at once. NOTE: This medicine is only for you. Do not share this medicine with others. What if I miss a dose? If you miss a dose, take it as soon as you can. If it is almost time for your next dose, take only that dose. Do not take double or extra doses. What may interact with this medicine?  levodopa This list may not describe all possible interactions. Give your health care provider a list of all the medicines, herbs, non-prescription drugs, or dietary supplements you use. Also tell them if you smoke, drink alcohol, or use illegal  drugs. Some items may interact with your medicine. What should I watch for while using this medicine? See your health care professional for regular checks on your progress. Remember that vitamin supplements do not replace the need for good nutrition from a balanced diet. What side effects may I notice from receiving this medicine? Side effects that you should report to your doctor or health care professional as soon as possible:  allergic reaction such as skin rash or difficulty breathing  vomiting Side effects that usually do not require medical attention (report to your doctor or health care professional if they continue or are bothersome):  nausea  stomach upset This list may not describe all possible side effects. Call your doctor for medical advice about side effects. You may report side effects to FDA at 1-800-FDA-1088. Where should I keep my medicine? Keep out of the reach of children. Most vitamins should be stored at controlled room temperature. Check your specific product directions. Protect from heat and moisture. Throw away any unused medicine after the expiration date. NOTE: This sheet is a summary. It may not cover all possible information. If you have questions about this medicine, talk to your doctor, pharmacist, or health care provider.  2020 Elsevier/Gold Standard (2007-07-18 00:59:55)  

## 2020-06-07 ENCOUNTER — Encounter: Payer: Self-pay | Admitting: Nurse Practitioner

## 2020-06-07 ENCOUNTER — Other Ambulatory Visit: Payer: Self-pay

## 2020-06-07 ENCOUNTER — Inpatient Hospital Stay: Payer: Medicare Other

## 2020-06-07 ENCOUNTER — Inpatient Hospital Stay (HOSPITAL_BASED_OUTPATIENT_CLINIC_OR_DEPARTMENT_OTHER): Payer: Medicare Other | Admitting: Nurse Practitioner

## 2020-06-07 VITALS — BP 154/63 | HR 82 | Temp 97.7°F | Resp 18 | Ht 62.0 in | Wt 195.3 lb

## 2020-06-07 DIAGNOSIS — C49A2 Gastrointestinal stromal tumor of stomach: Secondary | ICD-10-CM

## 2020-06-07 DIAGNOSIS — E538 Deficiency of other specified B group vitamins: Secondary | ICD-10-CM | POA: Diagnosis not present

## 2020-06-07 DIAGNOSIS — D500 Iron deficiency anemia secondary to blood loss (chronic): Secondary | ICD-10-CM

## 2020-06-07 LAB — CMP (CANCER CENTER ONLY)
ALT: 17 U/L (ref 0–44)
AST: 16 U/L (ref 15–41)
Albumin: 3.9 g/dL (ref 3.5–5.0)
Alkaline Phosphatase: 60 U/L (ref 38–126)
Anion gap: 5 (ref 5–15)
BUN: 15 mg/dL (ref 8–23)
CO2: 29 mmol/L (ref 22–32)
Calcium: 9 mg/dL (ref 8.9–10.3)
Chloride: 105 mmol/L (ref 98–111)
Creatinine: 0.9 mg/dL (ref 0.44–1.00)
GFR, Estimated: >60 mL/min (ref >60)
Glucose, Bld: 118 mg/dL — ABNORMAL HIGH (ref 70–99)
Potassium: 3.9 mmol/L (ref 3.5–5.1)
Sodium: 139 mmol/L (ref 135–145)
Total Bilirubin: 0.5 mg/dL (ref 0.3–1.2)
Total Protein: 7.1 g/dL (ref 6.5–8.1)

## 2020-06-07 LAB — RETIC PANEL
Immature Retic Fract: 12.8 % (ref 2.3–15.9)
RBC.: 3.26 MIL/uL — ABNORMAL LOW (ref 3.87–5.11)
Retic Count, Absolute: 40.1 10*3/uL (ref 19.0–186.0)
Retic Ct Pct: 1.2 % (ref 0.4–3.1)
Reticulocyte Hemoglobin: 39.4 pg (ref >27.9)

## 2020-06-07 LAB — CBC WITH DIFFERENTIAL (CANCER CENTER ONLY)
Abs Immature Granulocytes: 0.01 10*3/uL (ref 0.00–0.07)
Basophils Absolute: 0 10*3/uL (ref 0.0–0.1)
Basophils Relative: 0 %
Eosinophils Absolute: 0.2 10*3/uL (ref 0.0–0.5)
Eosinophils Relative: 3 %
HCT: 33.5 % — ABNORMAL LOW (ref 36.0–46.0)
Hemoglobin: 11.2 g/dL — ABNORMAL LOW (ref 12.0–15.0)
Immature Granulocytes: 0 %
Lymphocytes Relative: 30 %
Lymphs Abs: 1.8 10*3/uL (ref 0.7–4.0)
MCH: 34 pg (ref 26.0–34.0)
MCHC: 33.4 g/dL (ref 30.0–36.0)
MCV: 101.8 fL — ABNORMAL HIGH (ref 80.0–100.0)
Monocytes Absolute: 0.9 10*3/uL (ref 0.1–1.0)
Monocytes Relative: 15 %
Neutro Abs: 3.2 10*3/uL (ref 1.7–7.7)
Neutrophils Relative %: 52 %
Platelet Count: 190 10*3/uL (ref 150–400)
RBC: 3.29 MIL/uL — ABNORMAL LOW (ref 3.87–5.11)
RDW: 14 % (ref 11.5–15.5)
WBC Count: 6 10*3/uL (ref 4.0–10.5)
nRBC: 0 % (ref 0.0–0.2)

## 2020-06-07 LAB — IRON AND TIBC
Iron: 131 ug/dL (ref 41–142)
Saturation Ratios: 52 % (ref 21–57)
TIBC: 251 ug/dL (ref 236–444)
UIBC: 121 ug/dL (ref 120–384)

## 2020-06-07 LAB — FERRITIN: Ferritin: 392 ng/mL — ABNORMAL HIGH (ref 11–307)

## 2020-06-07 LAB — VITAMIN B12: Vitamin B-12: 249 pg/mL (ref 180–914)

## 2020-06-07 NOTE — Progress Notes (Signed)
[place]   [phone] Fax:([phone]) [phone]   Clinic Follow up Note   Patient Care Team: [name], MD as PCP - Charissa Bash, MD as Consulting Physician (Oncology) Jonnie Finner, RN as Oncology Nurse Navigator Stark Klein, MD as Consulting Physician (General Surgery) Milus Banister, MD as Attending Physician (Gastroenterology) 06/07/2020  CHIEF COMPLAINT: Follow up GIST   SUMMARY OF ONCOLOGIC HISTORY: Oncology History Overview Note  Cancer Staging Malignant gastrointestinal stromal tumor (GIST) of stomach ([place]) Staging form: Gastrointestinal Stromal Tumor - Gastric and Omental GIST, AJCC 8th Edition - Pathologic stage from 02/29/2020: pT3, Mitotic Rate: High - Unsigned    Malignant gastrointestinal stromal tumor (GIST) of stomach ([place])  01/13/2020 Procedure   EGD/Upper Endoscopy by Dr Ardis Hughs 01/13/20  IMPRESSION - There was an ulcerated mass along the greater curvature of the stomach, proximal edge was about 5-6cm from the GE junction and the mass was 7-8cm long, 2-3cm wide. It was not circumferental. The mass was friable with spontaneous oozing, contact oozing, ulcerated and heaped up. Some parts of the mass were quite soft whereas other areas were more firm. This was very suspicious for a malignancy and it was biopsied extensively. There was mild distal gastritis, biopsied to check for H. pylori. - The examination was otherwise normal.   01/13/2020 Initial Biopsy   FINAL MICROSCOPIC DIAGNOSIS:  A. STOMACH, BIOPSY:  - High grade gastrointestinal stromal tumor, see comment.   B. STOMACH, DISTAL, BIOPSY:  - Mild reactive gastropathy with erosions.  - Warthin-Starry is negative for Helicobacter pylori.  - No intestinal metaplasia, dysplasia, or malignancy.  COMMENT:  A. The tumor has spindle and epithelioid components and appears high  grade (7 mitosis/20 hpf). Immunohistochemistry is positive for CD117 and  CD34. The cells are  negative for cytokeratin 20, cytokeratin 7,  cytokeratin 5/6, CDX-2, SMA, and desmin. The findings are consistent  with a gastrointestinal stromal tumor. Dr. Vic Ripper has reviewed the  case. Dr. Ardis Hughs' nurse was notified on 01/18/2020.     01/13/2020 Imaging   CT CAP W contrast 01/13/20  IMPRESSION: 1. 2.1 cm x 1.1 cm well-defined area of low attenuation within the soft tissues of the left breast. This may represent a lymph node, however, correlation with mammography is recommended. 2. 6.5 mm sclerotic focus within the posterior aspect of the T7 vertebral body. This may represent a bone island, however, correlation with follow-up abdomen pelvis CT is recommended to exclude the presence of a metastatic lesion. 3. Small hiatal hernia. 4. 2.6 cm x 2.0 cm fat containing umbilical hernia. 5. Aortic atherosclerosis.    01/23/2020 Initial Diagnosis   Malignant gastrointestinal stromal tumor (GIST) of stomach ([place])   01/26/2020 -  Chemotherapy   Gleevec 459m once daily starting 01/26/20    02/11/2020 PET scan   IMPRESSION: 1. Focal mass along the stomach fundus with maximum SUV 15.7, compatible with malignancy. No findings of metastatic spread. 2. Mildly increased sub solid nodularity in the left upper lobe with only very subtle associated activity, likely inflammatory. Incidental note is made of some secondary pulmonary lobular septal thickening in the lung apices, and mild interstitial edema is not excluded given the underlying cardiomegaly. 3. Other imaging findings of potential clinical significance: Aortic Atherosclerosis (ICD10-I70.0). Coronary atherosclerosis. Sigmoid colon diverticulosis. Small umbilical hernia contains adipose tissue. Physiologic activity along the costovertebral joints.   02/29/2020 Surgery   Partial gastrectomy by Dr. BBarry Dienes   02/29/2020 Pathology Results   FINAL MICROSCOPIC DIAGNOSIS:  A. STOMACH, PARTIAL GASTRECTOMY:  - High grade gastrointestinal stromal  tumor with dedifferentiation,  spanning approximately 8 cm  - Tumor infiltrates through most of the distal gastric wall and involves  the distal resection margin  - See oncology table  - See comment  Immunohistochemical stains show that the tumor cells are positive for  CD117, CD34 and SMA (patchy).  A component of the tumor shows very  high-grade features with loss of CD117 and CD34 expression, consistent  with dedifferentiation.  Tumor also shows areas with  rhabdomyosarcomatous differentiation with presence of desmin expression.    04/13/2020 Procedure   EUS  by Dr Ardis Hughs  IMPRESSION - The staple line from recent partial gastrectomy was noted in the proximal stomach, runs longitudinally along the gastric fundus. The mucosa immediately around the stable line was normal appearing however in the more distal gastric body (anterior wall likely) the mucosa was quite irregular appearing; friable, firm, slightly ulcerated for 4-5cm at least. This was not a discrete tumor, and these changes were about 5cm from the pylorus. I sampled this region extensively with forceps following EUS evaluation below.- The muscularis propria was abnormally thickened starting around the staple line and continuing throughout most of the stomach distally. This thickening was not circumferential.     FINAL MICROSCOPIC DIAGNOSIS:   A. GASTRIC BODY, BIOPSY:  - Gastric mucosa with changes consistent with reactive gastropathy.  - No spindle cell lesion, intestinal metaplasia, dysplasia or carcinoma.       CURRENT THERAPY: Gleevec 43m once daily starting 01/27/20  INTERVAL HISTORY: Ms. SDoigreturns for follow up as scheduled. She continues gleevec 400 mg daily. Hair is thinning. She has rare mild nausea usually triggered by smells, well managed with meds. Bowels moving. Denies pain, bloating, bleeding, fever, chills, cough, chest pain, dyspnea or other new concerns. All other systems were reviewed with the  patient and are negative.  MEDICAL HISTORY:  Past Medical History:  Diagnosis Date  . Anxiety   . Cancer ([place]   stomach  . Complication of anesthesia   . Depression   . Hyperlipidemia   . Hypertension   . Migraine   . Osteopenia   . PONV (postoperative nausea and vomiting)   . Type II or unspecified type diabetes mellitus without mention of complication, uncontrolled 04/09/2013  . Upper GI bleed 01/13/2020    SURGICAL HISTORY: Past Surgical History:  Procedure Laterality Date  . ABDOMINAL HYSTERECTOMY    . APPENDECTOMY    . BIOPSY  01/13/2020   Procedure: BIOPSY;  Surgeon: JMilus Banister MD;  [facility];  Service: Endoscopy;;  . BIOPSY  04/13/2020   Procedure: BIOPSY;  Surgeon: JMilus Banister MD;  Location: [facility] ENDOSCOPY;  Service: Endoscopy;;  . CHOLECYSTECTOMY    . ESOPHAGOGASTRODUODENOSCOPY (EGD) WITH PROPOFOL N/A 01/13/2020   Procedure: ESOPHAGOGASTRODUODENOSCOPY (EGD) WITH PROPOFOL;  Surgeon: JMilus Banister MD;  [facility];  Service: Endoscopy;  Laterality: N/A;  . ESOPHAGOGASTRODUODENOSCOPY (EGD) WITH PROPOFOL N/A 04/13/2020   Procedure: ESOPHAGOGASTRODUODENOSCOPY (EGD) WITH PROPOFOL;  Surgeon: JMilus Banister MD;  Location: [facility] ENDOSCOPY;  Service: Endoscopy;  Laterality: N/A;  . EUS N/A 04/13/2020   Procedure: UPPER ENDOSCOPIC ULTRASOUND (EUS) LINEAR;  Surgeon: JMilus Banister MD;  Location: [facility] ENDOSCOPY;  Service: Endoscopy;  Laterality: N/A;  . PARTIAL GASTRECTOMY  02/28/2020    I have reviewed the social history and family history with the patient and they are unchanged from previous note.  ALLERGIES:  has No Known  Allergies.  MEDICATIONS:  Current Outpatient Medications  Medication Sig Dispense Refill  . atorvastatin (LIPITOR) 20 MG tablet TAKE 1 TABLET BY MOUTH EVERY DAY (Patient taking differently: Take 20 mg by mouth daily. ) 90 tablet 3  . diltiazem (CARDIZEM CD) 240 MG 24 hr capsule TAKE 1 CAPSULE(240 MG) BY MOUTH DAILY  (Patient taking differently: Take 240 mg by mouth daily. ) 90 capsule 3  . imatinib (GLEEVEC) 400 MG tablet TAKE 1 TABLET (400 MG TOTAL) BY MOUTH DAILY . TAKE WITH MEALS AND A LARGE GLASS OF WATER. (Patient taking differently: Take 400 mg by mouth daily. ) 30 tablet 4  . lisinopril (ZESTRIL) 20 MG tablet Take 0.5 tablets (10 mg total) by mouth daily. 45 tablet 3  . metFORMIN (GLUCOPHAGE-XR) 500 MG 24 hr tablet Take 2 tablets (1,000 mg total) by mouth daily with breakfast. 180 tablet 3  . ondansetron (ZOFRAN ODT) 8 MG disintegrating tablet Take 1 tablet (8 mg total) by mouth every 8 (eight) hours as needed for nausea or vomiting. 30 tablet 2  . oxyCODONE (OXY IR/ROXICODONE) 5 MG immediate release tablet Take 1-2 tablets (5-10 mg total) by mouth every 4 (four) hours as needed for moderate pain. 30 tablet 0  . pantoprazole (PROTONIX) 40 MG tablet TAKE 1 TABLET(40 MG) BY MOUTH TWICE DAILY BEFORE A MEAL (Patient taking differently: Take 40 mg by mouth 2 (two) times daily before a meal. TAKE 1 TABLET(40 MG) BY MOUTH TWICE DAILY BEFORE A MEAL) 180 tablet 1   No current facility-administered medications for this visit.    PHYSICAL EXAMINATION: ECOG PERFORMANCE STATUS: 1 - Symptomatic but completely ambulatory  Vitals:   06/07/20 1419  BP: (!) 154/63  Pulse: 82  Resp: 18  Temp: 97.7 F (36.5 C)  SpO2: 99%   Filed Weights   06/07/20 1419  Weight: 195 lb 4.8 oz (88.6 kg)    GENERAL:alert, no distress and comfortable SKIN: no rash  EYES:  sclera clear LUNGS: clear with normal breathing effort HEART: regular rate & rhythm, no lower extremity edema ABDOMEN:abdomen soft, non-tender and normal bowel sounds NEURO: alert & oriented x 3 with fluent speech, no focal motor/sensory deficits   LABORATORY DATA:  I have reviewed the data as listed CBC Latest Ref Rng & Units 06/07/2020 04/14/2020 03/15/2020  WBC 4.0 - 10.5 K/uL 6.0 5.2 7.3  Hemoglobin 12.0 - 15.0 g/dL 11.2(L) 9.6(L) 10.1(L)  Hematocrit  36.0 - 46.0 % 33.5(L) 29.3(L) 31.2(L)  Platelets 150 - 400 K/uL 190 235 361     CMP Latest Ref Rng & Units 06/07/2020 04/14/2020 03/15/2020  Glucose 70 - 99 mg/dL 118(H) 140(H) 125(H)  BUN 8 - 23 mg/dL 15 9 8   Creatinine 0.44 - 1.00 mg/dL 0.90 0.85 0.91  Sodium 135 - 145 mmol/L 139 141 139  Potassium 3.5 - 5.1 mmol/L 3.9 3.3(L) 3.8  Chloride 98 - 111 mmol/L 105 104 103  CO2 22 - 32 mmol/L 29 28 30   Calcium 8.9 - 10.3 mg/dL 9.0 8.8(L) 9.2  Total Protein 6.5 - 8.1 g/dL 7.1 6.6 7.0  Total Bilirubin 0.3 - 1.2 mg/dL 0.5 0.7 0.8  Alkaline Phos 38 - 126 U/L 60 67 52  AST 15 - 41 U/L 16 14(L) 20  ALT 0 - 44 U/L 17 14 19       RADIOGRAPHIC STUDIES: I have personally reviewed the radiological images as listed and agreed with the findings in the report. No results found.   ASSESSMENT & PLAN: Brittainy Bucker Smithis a  74 y.o.femalewith    1.GastricGIST, pT3NxM0, High grade, Kit mutation (+), pT3NX -Diagnosed in 01/2020.After presenting with severe upper GI bleeding her work up with Endoscopy showedulcerated mass along the greater curvature of the stomach, proximal edge was about 5-6cm from the GE junctionand 7-8cm long and 2-3cm wide. The path showedHigh grade gastrointestinal stromal tumor.  -Her stagingCT CAP from 01/13/20 showed thickening of the greater curvature, but no gastric mass, and incidental findings of left breast mass and 6.13msclerotic lesion at T7. -Her diagnostic mammogram and ultrasound showed a1centimeter cyst in the left breast, no other evidence of malignancy.  -PET on 02/11/2020 showed a hypermetabolic focal mass along the stomach fundus compatible with malignancy, no evidence of metastatic disease -Given her bleeding tumor and high risk features she started neoadjuvant Gleevec 400 mg once daily on 01/27/2020. Her tumor is KIT + in exon 11 which predicts good response to imatinib.  -She underwent partial gastrectomy by Dr. BBarry Dieneson 02/29/2020; Path showed high-grade  GIST measuring 8 cm invading through most of the distal gastric wall with positive distal resection margin as well as rhabdomyosarcomatous dedifferentiation consistent with a very high grade disease. Case reviewed in tumor board, Dr. BBarry Dienesrecommended more extensive surgery but patient declined  -now on close surveillance with EGD q4-6 months and scan q6-12 months  -EUS 04/13/20 showed no evidence of GIST or malignancy, path negative . However Dr. JArdis Hughshas concern for residual disease.   2. Anemia secondary to GI bleeding from #1, B12 deficiency  -she received blood transfusion and Feraheme in 01/2020  -She was on B12 injections since 05/2019, changed to oral B12 in 11/2019 -B12 is decreased since partial gastrectomy but still in normal range, now on B12 injections monthly   3. Left Breast Mass -Her 01/13/20 CT scan showed 2.1cm left breast mass. She has not had mammogram in several years.  -Her diagnostic mammogram and ultrasound was negative except a 1 cm cyst in the left breast.  Not metabolic on 9/3 PET scan  4. T7 Spinal Lesion  -Her 01/13/20 CT scan also shows a6.5 mm sclerotic focus within the posterior aspect of the T7 vertebral body. -No metabolic spinal lesions on PET scan  5. Social Support -She has no children, lives with her husband who is paralyzed from a spinal tumor.  She is his primary caregiver -Limited family support from sister-in-law and brother, no home health   Dispo:  Ms. SSimaris clinically doing well. She continues Gleevec 400 mg once daily. She tolerates well with hair thinning and mild nausea. Side effects are well managed with supportive care at home. There is no clinical evidence of disease recurrence/progression.   Labs reviewed, anemia improved. She is not planning to undergo any another surgery. She will continue Gleevec and close surveillance including scan q6-12 months and EGD q4-6 months. We reviewed s/sx of recurrence.   Continue monthly B12  injection, she is being rescheduled for today's missed dose. Follow up in 2 months, or sooner if needed.    All questions were answered. The patient knows to call the clinic with any problems, questions or concerns. No barriers to learning were detected.     LAlla Feeling NP 06/07/20

## 2020-06-08 ENCOUNTER — Telehealth: Payer: Self-pay | Admitting: Hematology

## 2020-06-08 ENCOUNTER — Telehealth: Payer: Self-pay | Admitting: Nurse Practitioner

## 2020-06-08 NOTE — Telephone Encounter (Signed)
Scheduled appt per 12/30 sch msg for b12 inj nxt wk - called pt and pt is aware of appt date and time

## 2020-06-08 NOTE — Telephone Encounter (Signed)
Scheduled appointments per 12/29 los. Spoke to patient who is aware of appointments dates and times.  

## 2020-06-15 ENCOUNTER — Other Ambulatory Visit: Payer: Self-pay

## 2020-06-15 ENCOUNTER — Inpatient Hospital Stay: Payer: Medicare Other | Attending: Hematology

## 2020-06-15 VITALS — BP 156/72 | HR 80 | Temp 98.0°F | Resp 18

## 2020-06-15 DIAGNOSIS — E538 Deficiency of other specified B group vitamins: Secondary | ICD-10-CM | POA: Insufficient documentation

## 2020-06-15 DIAGNOSIS — K922 Gastrointestinal hemorrhage, unspecified: Secondary | ICD-10-CM | POA: Insufficient documentation

## 2020-06-15 DIAGNOSIS — D500 Iron deficiency anemia secondary to blood loss (chronic): Secondary | ICD-10-CM | POA: Diagnosis not present

## 2020-06-15 MED ORDER — CYANOCOBALAMIN 1000 MCG/ML IJ SOLN
1000.0000 ug | Freq: Once | INTRAMUSCULAR | Status: AC
  Administered 2020-06-15: 1000 ug via INTRAMUSCULAR

## 2020-06-15 MED ORDER — CYANOCOBALAMIN 1000 MCG/ML IJ SOLN
INTRAMUSCULAR | Status: AC
  Filled 2020-06-15: qty 1

## 2020-06-15 NOTE — Patient Instructions (Signed)
Cyanocobalamin, Pyridoxine, and Folate What is this medicine? A multivitamin containing folic acid, vitamin B6, and vitamin B12. This medicine may be used for other purposes; ask your health care provider or pharmacist if you have questions. COMMON BRAND NAME(S): AllanFol RX, AllanTex, Av-Vite FB, B Complex with Folic Acid, ComBgen, FaBB, Folamin, Folastin, Folbalin, Folbee, Folbic, Folcaps, Folgard, Folgard RX, Folgard RX 2.2, Folplex, Folplex 2.2, Foltabs 800, Foltx, Homocysteine Formula, Niva-Fol, NuFol, TL Gard RX, Virt-Gard, Virt-Vite, Virt-Vite Forte, Vita-Respa What should I tell my health care provider before I take this medicine? They need to know if you have any of these conditions:  bleeding or clotting disorder  history of anemia of any type  other chronic health condition  an unusual or allergic reaction to vitamins, other medicines, foods, dyes, or preservatives  pregnant or trying to get pregnant  breast-feeding How should I use this medicine? Take by mouth with a glass of water. May take with food. Follow the directions on the prescription label. It is usually given once a day. Do not take your medicine more often than directed. Contact your pediatrician regarding the use of this medicine in children. Special care may be needed. Overdosage: If you think you have taken too much of this medicine contact a poison control center or emergency room at once. NOTE: This medicine is only for you. Do not share this medicine with others. What if I miss a dose? If you miss a dose, take it as soon as you can. If it is almost time for your next dose, take only that dose. Do not take double or extra doses. What may interact with this medicine?  levodopa This list may not describe all possible interactions. Give your health care provider a list of all the medicines, herbs, non-prescription drugs, or dietary supplements you use. Also tell them if you smoke, drink alcohol, or use illegal  drugs. Some items may interact with your medicine. What should I watch for while using this medicine? See your health care professional for regular checks on your progress. Remember that vitamin supplements do not replace the need for good nutrition from a balanced diet. What side effects may I notice from receiving this medicine? Side effects that you should report to your doctor or health care professional as soon as possible:  allergic reaction such as skin rash or difficulty breathing  vomiting Side effects that usually do not require medical attention (report to your doctor or health care professional if they continue or are bothersome):  nausea  stomach upset This list may not describe all possible side effects. Call your doctor for medical advice about side effects. You may report side effects to FDA at 1-800-FDA-1088. Where should I keep my medicine? Keep out of the reach of children. Most vitamins should be stored at controlled room temperature. Check your specific product directions. Protect from heat and moisture. Throw away any unused medicine after the expiration date. NOTE: This sheet is a summary. It may not cover all possible information. If you have questions about this medicine, talk to your doctor, pharmacist, or health care provider.  2020 Elsevier/Gold Standard (2007-07-18 00:59:55)  

## 2020-06-16 ENCOUNTER — Ambulatory Visit: Payer: Medicare Other

## 2020-06-19 ENCOUNTER — Telehealth: Payer: Self-pay

## 2020-06-19 NOTE — Telephone Encounter (Signed)
I spoke with Bridget Grant and let her know her b12 is low and Dr Burr Medico would like her to get 2 additional b12 injections.  She verbalized understanding.  Injection appts made.

## 2020-06-23 ENCOUNTER — Inpatient Hospital Stay: Payer: Medicare Other

## 2020-06-23 ENCOUNTER — Other Ambulatory Visit: Payer: Self-pay

## 2020-06-23 DIAGNOSIS — D500 Iron deficiency anemia secondary to blood loss (chronic): Secondary | ICD-10-CM

## 2020-06-23 DIAGNOSIS — E538 Deficiency of other specified B group vitamins: Secondary | ICD-10-CM | POA: Diagnosis not present

## 2020-06-23 MED ORDER — CYANOCOBALAMIN 1000 MCG/ML IJ SOLN
1000.0000 ug | Freq: Once | INTRAMUSCULAR | Status: AC
  Administered 2020-06-23: 1000 ug via INTRAMUSCULAR

## 2020-06-23 MED ORDER — CYANOCOBALAMIN 1000 MCG/ML IJ SOLN
INTRAMUSCULAR | Status: AC
  Filled 2020-06-23: qty 1

## 2020-06-23 NOTE — Patient Instructions (Signed)

## 2020-06-30 ENCOUNTER — Ambulatory Visit: Payer: Medicare Other

## 2020-07-07 ENCOUNTER — Inpatient Hospital Stay: Payer: Medicare Other

## 2020-07-07 ENCOUNTER — Other Ambulatory Visit: Payer: Self-pay

## 2020-07-07 VITALS — BP 170/72 | HR 76 | Temp 98.4°F | Resp 18

## 2020-07-07 DIAGNOSIS — D500 Iron deficiency anemia secondary to blood loss (chronic): Secondary | ICD-10-CM

## 2020-07-07 DIAGNOSIS — E538 Deficiency of other specified B group vitamins: Secondary | ICD-10-CM | POA: Diagnosis not present

## 2020-07-07 DIAGNOSIS — C49A2 Gastrointestinal stromal tumor of stomach: Secondary | ICD-10-CM

## 2020-07-07 LAB — CBC WITH DIFFERENTIAL (CANCER CENTER ONLY)
Abs Immature Granulocytes: 0.01 10*3/uL (ref 0.00–0.07)
Basophils Absolute: 0 10*3/uL (ref 0.0–0.1)
Basophils Relative: 1 %
Eosinophils Absolute: 0.2 10*3/uL (ref 0.0–0.5)
Eosinophils Relative: 3 %
HCT: 33.2 % — ABNORMAL LOW (ref 36.0–46.0)
Hemoglobin: 11.4 g/dL — ABNORMAL LOW (ref 12.0–15.0)
Immature Granulocytes: 0 %
Lymphocytes Relative: 29 %
Lymphs Abs: 1.6 10*3/uL (ref 0.7–4.0)
MCH: 34.7 pg — ABNORMAL HIGH (ref 26.0–34.0)
MCHC: 34.3 g/dL (ref 30.0–36.0)
MCV: 100.9 fL — ABNORMAL HIGH (ref 80.0–100.0)
Monocytes Absolute: 0.7 10*3/uL (ref 0.1–1.0)
Monocytes Relative: 13 %
Neutro Abs: 2.9 10*3/uL (ref 1.7–7.7)
Neutrophils Relative %: 54 %
Platelet Count: 202 10*3/uL (ref 150–400)
RBC: 3.29 MIL/uL — ABNORMAL LOW (ref 3.87–5.11)
RDW: 13 % (ref 11.5–15.5)
WBC Count: 5.4 10*3/uL (ref 4.0–10.5)
nRBC: 0 % (ref 0.0–0.2)

## 2020-07-07 LAB — CMP (CANCER CENTER ONLY)
ALT: 14 U/L (ref 0–44)
AST: 16 U/L (ref 15–41)
Albumin: 3.9 g/dL (ref 3.5–5.0)
Alkaline Phosphatase: 73 U/L (ref 38–126)
Anion gap: 8 (ref 5–15)
BUN: 15 mg/dL (ref 8–23)
CO2: 27 mmol/L (ref 22–32)
Calcium: 8.7 mg/dL — ABNORMAL LOW (ref 8.9–10.3)
Chloride: 105 mmol/L (ref 98–111)
Creatinine: 0.99 mg/dL (ref 0.44–1.00)
GFR, Estimated: >60 mL/min (ref >60)
Glucose, Bld: 159 mg/dL — ABNORMAL HIGH (ref 70–99)
Potassium: 4.3 mmol/L (ref 3.5–5.1)
Sodium: 140 mmol/L (ref 135–145)
Total Bilirubin: 0.4 mg/dL (ref 0.3–1.2)
Total Protein: 6.9 g/dL (ref 6.5–8.1)

## 2020-07-07 LAB — RETIC PANEL
Immature Retic Fract: 9.2 % (ref 2.3–15.9)
RBC.: 3.37 MIL/uL — ABNORMAL LOW (ref 3.87–5.11)
Retic Count, Absolute: 31.7 10*3/uL (ref 19.0–186.0)
Retic Ct Pct: 0.9 % (ref 0.4–3.1)
Reticulocyte Hemoglobin: 37.5 pg (ref >27.9)

## 2020-07-07 MED ORDER — CYANOCOBALAMIN 1000 MCG/ML IJ SOLN
INTRAMUSCULAR | Status: AC
  Filled 2020-07-07: qty 1

## 2020-07-07 MED ORDER — CYANOCOBALAMIN 1000 MCG/ML IJ SOLN
1000.0000 ug | Freq: Once | INTRAMUSCULAR | Status: AC
  Administered 2020-07-07: 1000 ug via INTRAMUSCULAR

## 2020-07-07 NOTE — Patient Instructions (Signed)

## 2020-07-10 LAB — FERRITIN: Ferritin: 350 ng/mL — ABNORMAL HIGH (ref 11–307)

## 2020-07-25 ENCOUNTER — Ambulatory Visit: Payer: Medicare Other | Admitting: Internal Medicine

## 2020-08-04 NOTE — Progress Notes (Signed)
[place]   [phone] Fax:([phone]   Clinic Follow up Note   Patient Care Team: [name], MD as PCP - Charissa Bash, MD as Consulting Physician (Oncology) Jonnie Finner, RN as Oncology Nurse Navigator Stark Klein, MD as Consulting Physician (General Surgery) Milus Banister, MD as Attending Physician (Gastroenterology)  Date of Service:  08/07/2020  CHIEF COMPLAINT: F/u of GIST  SUMMARY OF ONCOLOGIC HISTORY: Oncology History Overview Note  Cancer Staging Malignant gastrointestinal stromal tumor (GIST) of stomach ([place]) Staging form: Gastrointestinal Stromal Tumor - Gastric and Omental GIST, AJCC 8th Edition - Pathologic stage from 02/29/2020: pT3, Mitotic Rate: High - Unsigned    Malignant gastrointestinal stromal tumor (GIST) of stomach ([place])  01/13/2020 Procedure   EGD/Upper Endoscopy by Dr Ardis Hughs 01/13/20  IMPRESSION - There was an ulcerated mass along the greater curvature of the stomach, proximal edge was about 5-6cm from the GE junction and the mass was 7-8cm long, 2-3cm wide. It was not circumferental. The mass was friable with spontaneous oozing, contact oozing, ulcerated and heaped up. Some parts of the mass were quite soft whereas other areas were more firm. This was very suspicious for a malignancy and it was biopsied extensively. There was mild distal gastritis, biopsied to check for H. pylori. - The examination was otherwise normal.   01/13/2020 Initial Biopsy   FINAL MICROSCOPIC DIAGNOSIS:  A. STOMACH, BIOPSY:  - High grade gastrointestinal stromal tumor, see comment.   B. STOMACH, DISTAL, BIOPSY:  - Mild reactive gastropathy with erosions.  - Warthin-Starry is negative for Helicobacter pylori.  - No intestinal metaplasia, dysplasia, or malignancy.  COMMENT:  A. The tumor has spindle and epithelioid components and appears high  grade (7 mitosis/20 hpf). Immunohistochemistry is positive for CD117 and  CD34. The  cells are negative for cytokeratin 20, cytokeratin 7,  cytokeratin 5/6, CDX-2, SMA, and desmin. The findings are consistent  with a gastrointestinal stromal tumor. Dr. Vic Ripper has reviewed the  case. Dr. Ardis Hughs' nurse was notified on 01/18/2020.     01/13/2020 Imaging   CT CAP W contrast 01/13/20  IMPRESSION: 1. 2.1 cm x 1.1 cm well-defined area of low attenuation within the soft tissues of the left breast. This may represent a lymph node, however, correlation with mammography is recommended. 2. 6.5 mm sclerotic focus within the posterior aspect of the T7 vertebral body. This may represent a bone island, however, correlation with follow-up abdomen pelvis CT is recommended to exclude the presence of a metastatic lesion. 3. Small hiatal hernia. 4. 2.6 cm x 2.0 cm fat containing umbilical hernia. 5. Aortic atherosclerosis.    01/23/2020 Initial Diagnosis   Malignant gastrointestinal stromal tumor (GIST) of stomach ([place])   01/26/2020 -  Chemotherapy   Gleevec 436m once daily starting 01/26/20    02/11/2020 PET scan   IMPRESSION: 1. Focal mass along the stomach fundus with maximum SUV 15.7, compatible with malignancy. No findings of metastatic spread. 2. Mildly increased sub solid nodularity in the left upper lobe with only very subtle associated activity, likely inflammatory. Incidental note is made of some secondary pulmonary lobular septal thickening in the lung apices, and mild interstitial edema is not excluded given the underlying cardiomegaly. 3. Other imaging findings of potential clinical significance: Aortic Atherosclerosis (ICD10-I70.0). Coronary atherosclerosis. Sigmoid colon diverticulosis. Small umbilical hernia contains adipose tissue. Physiologic activity along the costovertebral joints.   02/29/2020 Surgery   Partial gastrectomy by Dr. BBarry Dienes   02/29/2020 Pathology Results  FINAL MICROSCOPIC DIAGNOSIS:  A. STOMACH, PARTIAL GASTRECTOMY:  - High grade  gastrointestinal stromal tumor with dedifferentiation,  spanning approximately 8 cm  - Tumor infiltrates through most of the distal gastric wall and involves  the distal resection margin  - See oncology table  - See comment  Immunohistochemical stains show that the tumor cells are positive for  CD117, CD34 and SMA (patchy).  A component of the tumor shows very  high-grade features with loss of CD117 and CD34 expression, consistent  with dedifferentiation.  Tumor also shows areas with  rhabdomyosarcomatous differentiation with presence of desmin expression.    04/13/2020 Procedure   EUS  by Dr Ardis Hughs  IMPRESSION - The staple line from recent partial gastrectomy was noted in the proximal stomach, runs longitudinally along the gastric fundus. The mucosa immediately around the stable line was normal appearing however in the more distal gastric body (anterior wall likely) the mucosa was quite irregular appearing; friable, firm, slightly ulcerated for 4-5cm at least. This was not a discrete tumor, and these changes were about 5cm from the pylorus. I sampled this region extensively with forceps following EUS evaluation below.- The muscularis propria was abnormally thickened starting around the staple line and continuing throughout most of the stomach distally. This thickening was not circumferential.     FINAL MICROSCOPIC DIAGNOSIS:   A. GASTRIC BODY, BIOPSY:  - Gastric mucosa with changes consistent with reactive gastropathy.  - No spindle cell lesion, intestinal metaplasia, dysplasia or carcinoma.        CURRENT THERAPY:  Gleevec 421m once daily starting 01/27/20 B12 injection monthly starting 04/14/20.   INTERVAL HISTORY:  Bridget GAGANis here for a follow up. She presents to the clinic alone. She notes she is doing well. She denies diarrhea of stomach issues. She is tolerating Gleevec well. She has been able to graze food and maintain her weight. She denies acid reflux. She  denies bloating or constipation or blood in stool. She notes she is starting pre-natal vitamin. She notes she still has nausea. I reviewed her medication list with her.     REVIEW OF SYSTEMS:   Constitutional: Denies fevers, chills or abnormal weight loss Eyes: Denies blurriness of vision Ears, nose, mouth, throat, and face: Denies mucositis or sore throat Respiratory: Denies cough, dyspnea or wheezes Cardiovascular: Denies palpitation, chest discomfort or lower extremity swelling Gastrointestinal:  Denies nausea, heartburn or change in bowel habits Skin: Denies abnormal skin rashes Lymphatics: Denies new lymphadenopathy or easy bruising Neurological:Denies numbness, tingling or new weaknesses Behavioral/Psych: Mood is stable, no new changes  All other systems were reviewed with the patient and are negative.  MEDICAL HISTORY:  Past Medical History:  Diagnosis Date  . Anxiety   . Cancer [place]   stomach  . Complication of anesthesia   . Depression   . Hyperlipidemia   . Hypertension   . Migraine   . Osteopenia   . PONV (postoperative nausea and vomiting)   . Type II or unspecified type diabetes mellitus without mention of complication, uncontrolled 04/09/2013  . Upper GI bleed 01/13/2020    SURGICAL HISTORY: Past Surgical History:  Procedure Laterality Date  . ABDOMINAL HYSTERECTOMY    . APPENDECTOMY    . BIOPSY  01/13/2020   Procedure: BIOPSY;  Surgeon: JMilus Banister MD;  [facility];  Service: Endoscopy;;  . BIOPSY  04/13/2020   Procedure: BIOPSY;  Surgeon: JMilus Banister MD;  Location: [facility] ENDOSCOPY;  Service: Endoscopy;;  . CHOLECYSTECTOMY    .  ESOPHAGOGASTRODUODENOSCOPY (EGD) WITH PROPOFOL N/A 01/13/2020   Procedure: ESOPHAGOGASTRODUODENOSCOPY (EGD) WITH PROPOFOL;  Surgeon: Milus Banister, MD;  Location: [facility] ENDOSCOPY;  Service: Endoscopy;  Laterality: N/A;  . ESOPHAGOGASTRODUODENOSCOPY (EGD) WITH PROPOFOL N/A 04/13/2020   Procedure:  ESOPHAGOGASTRODUODENOSCOPY (EGD) WITH PROPOFOL;  Surgeon: Milus Banister, MD;  Location: [facility] ENDOSCOPY;  Service: Endoscopy;  Laterality: N/A;  . EUS N/A 04/13/2020   Procedure: UPPER ENDOSCOPIC ULTRASOUND (EUS) LINEAR;  Surgeon: Milus Banister, MD;  Location: [facility] ENDOSCOPY;  Service: Endoscopy;  Laterality: N/A;  . PARTIAL GASTRECTOMY  02/28/2020    I have reviewed the social history and family history with the patient and they are unchanged from previous note.  ALLERGIES:  has No Known Allergies.  MEDICATIONS:  Current Outpatient Medications  Medication Sig Dispense Refill  . atorvastatin (LIPITOR) 20 MG tablet TAKE 1 TABLET BY MOUTH EVERY DAY (Patient taking differently: Take 20 mg by mouth daily. ) 90 tablet 3  . diltiazem (CARDIZEM CD) 240 MG 24 hr capsule TAKE 1 CAPSULE(240 MG) BY MOUTH DAILY (Patient taking differently: Take 240 mg by mouth daily. ) 90 capsule 3  . imatinib (GLEEVEC) 400 MG tablet TAKE 1 TABLET (400 MG TOTAL) BY MOUTH DAILY . TAKE WITH MEALS AND A LARGE GLASS OF WATER. (Patient taking differently: Take 400 mg by mouth daily. ) 30 tablet 4  . lisinopril (ZESTRIL) 20 MG tablet Take 0.5 tablets (10 mg total) by mouth daily. 45 tablet 3  . metFORMIN (GLUCOPHAGE-XR) 500 MG 24 hr tablet Take 2 tablets (1,000 mg total) by mouth daily with breakfast. 180 tablet 3  . ondansetron (ZOFRAN ODT) 8 MG disintegrating tablet Take 1 tablet (8 mg total) by mouth every 8 (eight) hours as needed for nausea or vomiting. 30 tablet 2  . pantoprazole (PROTONIX) 40 MG tablet TAKE 1 TABLET(40 MG) BY MOUTH TWICE DAILY BEFORE A MEAL (Patient taking differently: Take 40 mg by mouth 2 (two) times daily before a meal. TAKE 1 TABLET(40 MG) BY MOUTH TWICE DAILY BEFORE A MEAL) 180 tablet 1   No current facility-administered medications for this visit.    PHYSICAL EXAMINATION: ECOG PERFORMANCE STATUS: 0 - Asymptomatic  Vitals:   08/07/20 1414  BP: (!) 164/61  Pulse: 71  Resp: 16  Temp: 97.9 F  (36.6 C)  SpO2: 98%   Filed Weights   08/07/20 1414  Weight: 194 lb 14.4 oz (88.4 kg)    GENERAL:alert, no distress and comfortable SKIN: skin color, texture, turgor are normal, no rashes or significant lesions EYES: normal, Conjunctiva are pink and non-injected, sclera clear  NECK: supple, thyroid normal size, non-tender, without nodularity LYMPH:  no palpable lymphadenopathy in the cervical, axillary  LUNGS: clear to auscultation and percussion with normal breathing effort HEART: regular rate & rhythm and no murmurs (+)  lower extremity edema ABDOMEN:abdomen soft, non-tender and normal bowel sounds Musculoskeletal:no cyanosis of digits and no clubbing  NEURO: alert & oriented x 3 with fluent speech, no focal motor/sensory deficits  LABORATORY DATA:  I have reviewed the data as listed CBC Latest Ref Rng & Units 08/07/2020 07/07/2020 06/07/2020  WBC 4.0 - 10.5 K/uL 6.2 5.4 6.0  Hemoglobin 12.0 - 15.0 g/dL 11.7(L) 11.4(L) 11.2(L)  Hematocrit 36.0 - 46.0 % 34.7(L) 33.2(L) 33.5(L)  Platelets 150 - 400 K/uL 225 202 190     CMP Latest Ref Rng & Units 08/07/2020 07/07/2020 06/07/2020  Glucose 70 - 99 mg/dL 146(H) 159(H) 118(H)  BUN 8 - 23 mg/dL 14 15 15  Creatinine 0.44 - 1.00 mg/dL 0.95 0.99 0.90  Sodium 135 - 145 mmol/L 141 140 139  Potassium 3.5 - 5.1 mmol/L 4.1 4.3 3.9  Chloride 98 - 111 mmol/L 104 105 105  CO2 22 - 32 mmol/L 26 27 29   Calcium 8.9 - 10.3 mg/dL 9.2 8.7(L) 9.0  Total Protein 6.5 - 8.1 g/dL 7.4 6.9 7.1  Total Bilirubin 0.3 - 1.2 mg/dL 0.4 0.4 0.5  Alkaline Phos 38 - 126 U/L 67 73 60  AST 15 - 41 U/L 16 16 16   ALT 0 - 44 U/L 14 14 17       RADIOGRAPHIC STUDIES: I have personally reviewed the radiological images as listed and agreed with the findings in the report. No results found.   ASSESSMENT & PLAN:  Bridget Grant is a 75 y.o. female with   1.GastricGIST, ypT3NxM0, High grade, Kit mutation (+) -She was diagnosed in 01/2020 with High grade  gastrointestinal stromal tumor.  -Her stagingCT CAP from 01/13/20 did not show gastric mass (I think the greater curvature of gastric wall is thickened)but did show incidental findings of left breast mass and 6.69msclerotic lesion at T7. -Given her bleeding tumor, large size and other work up, I started her on Gleevec 4039mon 01/27/20, prior to surgery.  -She underwent surgery on 02/29/20 with Dr ByBarry DienesSurgical path shows high-grade GIST spanning 8 cm infiltrating through most of the distal gastric wall with a positive distal resection margin,staged pT3. Pathologyalsoshows areas of rhabdomyosarcomatousdifferentiation but is not felt to represent a mixed rhabdomyosarcoma/GIST tumor. Unfortunately, given these features she is at very high risk for recurrence and metastasis.  -She underwent EUS on 04/13/20 which showed no evidence of malignancy or GIST. Although biopsy does not show GIST, Dr JaArdis Hughsas concern for residual disease. Will monitor her with scan every 6-12 months and repeat endoscopy every 3-4 months.  -she is on adjuvant Imatinib, will continue, she is tolerating well  -She is clinically doing well. Labs reviewed, CBC and CMP WNL, except Hg 11.7, BG 146. Physical exam.  -Continue surveillance. Next Scan before next visit.  -F/u in 1 month   2. Anemia secondary to GI bleeding from #1, B12 deficiency  -She was hospitalized on 01/12/20 due to severe anemia with Hg 7.9.  -She was treated with blood transfusion on 01/17/20 and 03/06/20 and IV Feraheme on 01/15/20 and 02/09/20 -She was on Monthly B12 injections 05/2019-11/2019. She restarted on 04/14/20 after stomach surgery.   -She has prenatal vitamin without iron. She can switch to another with iron.    3. Left Breast Mass -Her 01/13/20 CT scan showed 2.1cm left breast mass. She has not had mammogram in several years.  -Her diagnostic mammogram and ultrasound was negative except a 1 cm cyst in the left breast  4. T7 Spinal Lesion  -Her  01/13/20 CT scan also shows a6.5 mm sclerotic focus within the posterior aspect of the T7 vertebral body. -Prior PET scan was negative for bone mets -she is asymptomatic    PLAN: -Proceed with B12 injection today and continue monthly  -[facility] 40066mnce daily[facility] B12 injection in a month with lab and CT AP w contrast a few days before  -copy Dr. ByeBarry Dienesill reach out to Dr. JacArdis Hughsr her next EGD next month   No problem-specific Assessment & Plan notes found for this encounter.   Orders Placed This Encounter  Procedures  . CT Abdomen Pelvis W Contrast  Standing Status:   Future    Standing Expiration Date:   08/07/2021    Order Specific Question:   If indicated for the ordered procedure, I authorize the administration of contrast media per Radiology protocol    Answer:   Yes    Order Specific Question:   Preferred imaging location?    Answer:   [facility]    Order Specific Question:   Release to patient    Answer:   Immediate    Order Specific Question:   Is Oral Contrast requested for this exam?    Answer:   Yes, Per Radiology protocol   All questions were answered. The patient knows to call the clinic with any problems, questions or concerns. No barriers to learning was detected. The total time spent in the appointment was 30 minutes.     Truitt Merle, MD 08/07/2020   I, Joslyn Devon, am acting as scribe for Truitt Merle, MD.   I have reviewed the above documentation for accuracy and completeness, and I agree with the above.

## 2020-08-07 ENCOUNTER — Other Ambulatory Visit: Payer: Self-pay

## 2020-08-07 ENCOUNTER — Encounter: Payer: Self-pay | Admitting: Hematology

## 2020-08-07 ENCOUNTER — Inpatient Hospital Stay: Payer: Medicare Other | Attending: Hematology

## 2020-08-07 ENCOUNTER — Inpatient Hospital Stay: Payer: Medicare Other

## 2020-08-07 ENCOUNTER — Inpatient Hospital Stay (HOSPITAL_BASED_OUTPATIENT_CLINIC_OR_DEPARTMENT_OTHER): Payer: Medicare Other | Admitting: Hematology

## 2020-08-07 VITALS — BP 164/61 | HR 71 | Temp 97.9°F | Resp 16 | Ht 62.0 in | Wt 194.9 lb

## 2020-08-07 DIAGNOSIS — D500 Iron deficiency anemia secondary to blood loss (chronic): Secondary | ICD-10-CM | POA: Diagnosis not present

## 2020-08-07 DIAGNOSIS — E538 Deficiency of other specified B group vitamins: Secondary | ICD-10-CM

## 2020-08-07 DIAGNOSIS — C49A2 Gastrointestinal stromal tumor of stomach: Secondary | ICD-10-CM | POA: Diagnosis not present

## 2020-08-07 LAB — CBC WITH DIFFERENTIAL (CANCER CENTER ONLY)
Abs Immature Granulocytes: 0.01 10*3/uL (ref 0.00–0.07)
Basophils Absolute: 0 10*3/uL (ref 0.0–0.1)
Basophils Relative: 1 %
Eosinophils Absolute: 0.2 10*3/uL (ref 0.0–0.5)
Eosinophils Relative: 3 %
HCT: 34.7 % — ABNORMAL LOW (ref 36.0–46.0)
Hemoglobin: 11.7 g/dL — ABNORMAL LOW (ref 12.0–15.0)
Immature Granulocytes: 0 %
Lymphocytes Relative: 25 %
Lymphs Abs: 1.6 10*3/uL (ref 0.7–4.0)
MCH: 34 pg (ref 26.0–34.0)
MCHC: 33.7 g/dL (ref 30.0–36.0)
MCV: 100.9 fL — ABNORMAL HIGH (ref 80.0–100.0)
Monocytes Absolute: 0.7 10*3/uL (ref 0.1–1.0)
Monocytes Relative: 12 %
Neutro Abs: 3.8 10*3/uL (ref 1.7–7.7)
Neutrophils Relative %: 59 %
Platelet Count: 225 10*3/uL (ref 150–400)
RBC: 3.44 MIL/uL — ABNORMAL LOW (ref 3.87–5.11)
RDW: 13.2 % (ref 11.5–15.5)
WBC Count: 6.2 10*3/uL (ref 4.0–10.5)
nRBC: 0 % (ref 0.0–0.2)

## 2020-08-07 LAB — CMP (CANCER CENTER ONLY)
ALT: 14 U/L (ref 0–44)
AST: 16 U/L (ref 15–41)
Albumin: 4.2 g/dL (ref 3.5–5.0)
Alkaline Phosphatase: 67 U/L (ref 38–126)
Anion gap: 11 (ref 5–15)
BUN: 14 mg/dL (ref 8–23)
CO2: 26 mmol/L (ref 22–32)
Calcium: 9.2 mg/dL (ref 8.9–10.3)
Chloride: 104 mmol/L (ref 98–111)
Creatinine: 0.95 mg/dL (ref 0.44–1.00)
GFR, Estimated: >60 mL/min (ref >60)
Glucose, Bld: 146 mg/dL — ABNORMAL HIGH (ref 70–99)
Potassium: 4.1 mmol/L (ref 3.5–5.1)
Sodium: 141 mmol/L (ref 135–145)
Total Bilirubin: 0.4 mg/dL (ref 0.3–1.2)
Total Protein: 7.4 g/dL (ref 6.5–8.1)

## 2020-08-07 LAB — FERRITIN: Ferritin: 282 ng/mL (ref 11–307)

## 2020-08-07 LAB — RETIC PANEL
Immature Retic Fract: 12.6 % (ref 2.3–15.9)
RBC.: 3.39 MIL/uL — ABNORMAL LOW (ref 3.87–5.11)
Retic Count, Absolute: 43.7 10*3/uL (ref 19.0–186.0)
Retic Ct Pct: 1.3 % (ref 0.4–3.1)
Reticulocyte Hemoglobin: 38.2 pg (ref >27.9)

## 2020-08-07 LAB — VITAMIN B12: Vitamin B-12: 310 pg/mL (ref 180–914)

## 2020-08-07 MED ORDER — CYANOCOBALAMIN 1000 MCG/ML IJ SOLN
1000.0000 ug | Freq: Once | INTRAMUSCULAR | Status: AC
  Administered 2020-08-07: 1000 ug via INTRAMUSCULAR

## 2020-08-07 MED ORDER — CYANOCOBALAMIN 1000 MCG/ML IJ SOLN
INTRAMUSCULAR | Status: AC
  Filled 2020-08-07: qty 1

## 2020-08-07 NOTE — Patient Instructions (Signed)

## 2020-08-08 ENCOUNTER — Other Ambulatory Visit: Payer: Self-pay

## 2020-08-08 ENCOUNTER — Telehealth: Payer: Self-pay | Admitting: Hematology

## 2020-08-08 ENCOUNTER — Telehealth: Payer: Self-pay

## 2020-08-08 DIAGNOSIS — C49A0 Gastrointestinal stromal tumor, unspecified site: Secondary | ICD-10-CM

## 2020-08-08 NOTE — Telephone Encounter (Signed)
-----   Message from Milus Banister, MD sent at 08/08/2020  8:06 AM EST ----- No real set timeframe for surveillance testing that I am aware of but I think repeating with endoscopy and endoscopic ultrasound about 6 months from her last one seems reasonable.   [name], She needs upper endoscopic ultrasound May for personal history of progressive gastric GIST, positive surgical margins.  Thanks   ----- Message ----- From: Truitt Merle, MD Sent: 08/07/2020  10:30 PM EST To: Milus Banister, MD, Stark Klein, MD  Dan,  I saw her today, she is clinically doing well, and tolerating [place]. Her last EGD in 04/2020. When do you plan to repeat EGD? Due to her positive surgical margin, she probably needs close monitoring.   I plan to repeat CT in a month and see her back.  Thanks  Krista Blue

## 2020-08-08 NOTE — Telephone Encounter (Signed)
The pt has been scheduled for EUS with Dr Ardis Hughs on 10/12/20 at 730 am at [facility].  COVID test on 5/2 at 10 am.  Left message on machine to call back

## 2020-08-08 NOTE — Telephone Encounter (Signed)
Scheduled follow-up appointments per 2/28 los. Patient is aware.

## 2020-08-09 NOTE — Telephone Encounter (Signed)
EUS scheduled, pt instructed and medications reviewed.  Patient instructions mailed to home.  Patient to call with any questions or concerns.  

## 2020-08-31 ENCOUNTER — Other Ambulatory Visit: Payer: Self-pay | Admitting: Hematology

## 2020-08-31 ENCOUNTER — Other Ambulatory Visit: Payer: Self-pay

## 2020-08-31 ENCOUNTER — Ambulatory Visit (HOSPITAL_COMMUNITY)
Admission: RE | Admit: 2020-08-31 | Discharge: 2020-08-31 | Disposition: A | Discharge status: Home / Self Care | Payer: Medicare Other | Source: Ambulatory Visit | Attending: Hematology | Admitting: Hematology

## 2020-08-31 ENCOUNTER — Inpatient Hospital Stay: Payer: Medicare Other | Attending: Hematology

## 2020-08-31 DIAGNOSIS — K429 Umbilical hernia without obstruction or gangrene: Secondary | ICD-10-CM | POA: Diagnosis not present

## 2020-08-31 DIAGNOSIS — D500 Iron deficiency anemia secondary to blood loss (chronic): Secondary | ICD-10-CM

## 2020-08-31 DIAGNOSIS — C49A2 Gastrointestinal stromal tumor of stomach: Secondary | ICD-10-CM | POA: Insufficient documentation

## 2020-08-31 DIAGNOSIS — D3500 Benign neoplasm of unspecified adrenal gland: Secondary | ICD-10-CM | POA: Diagnosis not present

## 2020-08-31 DIAGNOSIS — E538 Deficiency of other specified B group vitamins: Secondary | ICD-10-CM | POA: Insufficient documentation

## 2020-08-31 DIAGNOSIS — K439 Ventral hernia without obstruction or gangrene: Secondary | ICD-10-CM | POA: Diagnosis not present

## 2020-08-31 LAB — CMP (CANCER CENTER ONLY)
ALT: 15 U/L (ref 0–44)
AST: 18 U/L (ref 15–41)
Albumin: 4 g/dL (ref 3.5–5.0)
Alkaline Phosphatase: 63 U/L (ref 38–126)
Anion gap: 10 (ref 5–15)
BUN: 11 mg/dL (ref 8–23)
CO2: 28 mmol/L (ref 22–32)
Calcium: 9.3 mg/dL (ref 8.9–10.3)
Chloride: 104 mmol/L (ref 98–111)
Creatinine: 0.79 mg/dL (ref 0.44–1.00)
GFR, Estimated: >60 mL/min (ref >60)
Glucose, Bld: 102 mg/dL — ABNORMAL HIGH (ref 70–99)
Potassium: 3.6 mmol/L (ref 3.5–5.1)
Sodium: 142 mmol/L (ref 135–145)
Total Bilirubin: 0.5 mg/dL (ref 0.3–1.2)
Total Protein: 7 g/dL (ref 6.5–8.1)

## 2020-08-31 LAB — CBC WITH DIFFERENTIAL (CANCER CENTER ONLY)
Abs Immature Granulocytes: 0.02 10*3/uL (ref 0.00–0.07)
Basophils Absolute: 0 10*3/uL (ref 0.0–0.1)
Basophils Relative: 1 %
Eosinophils Absolute: 0.2 10*3/uL (ref 0.0–0.5)
Eosinophils Relative: 3 %
HCT: 32.2 % — ABNORMAL LOW (ref 36.0–46.0)
Hemoglobin: 11 g/dL — ABNORMAL LOW (ref 12.0–15.0)
Immature Granulocytes: 0 %
Lymphocytes Relative: 29 %
Lymphs Abs: 2.2 10*3/uL (ref 0.7–4.0)
MCH: 34.2 pg — ABNORMAL HIGH (ref 26.0–34.0)
MCHC: 34.2 g/dL (ref 30.0–36.0)
MCV: 100 fL (ref 80.0–100.0)
Monocytes Absolute: 0.7 10*3/uL (ref 0.1–1.0)
Monocytes Relative: 9 %
Neutro Abs: 4.5 10*3/uL (ref 1.7–7.7)
Neutrophils Relative %: 58 %
Platelet Count: 224 10*3/uL (ref 150–400)
RBC: 3.22 MIL/uL — ABNORMAL LOW (ref 3.87–5.11)
RDW: 13.4 % (ref 11.5–15.5)
WBC Count: 7.6 10*3/uL (ref 4.0–10.5)
nRBC: 0 % (ref 0.0–0.2)

## 2020-08-31 LAB — RETIC PANEL
Immature Retic Fract: 11.7 % (ref 2.3–15.9)
RBC.: 3.25 MIL/uL — ABNORMAL LOW (ref 3.87–5.11)
Retic Count, Absolute: 44.2 10*3/uL (ref 19.0–186.0)
Retic Ct Pct: 1.4 % (ref 0.4–3.1)
Reticulocyte Hemoglobin: 38.3 pg (ref >27.9)

## 2020-08-31 LAB — IRON AND TIBC
Iron: 123 ug/dL (ref 41–142)
Saturation Ratios: 53 % (ref 21–57)
TIBC: 234 ug/dL — ABNORMAL LOW (ref 236–444)
UIBC: 111 ug/dL — ABNORMAL LOW (ref 120–384)

## 2020-08-31 LAB — FERRITIN: Ferritin: 379 ng/mL — ABNORMAL HIGH (ref 11–307)

## 2020-08-31 IMAGING — CT CT ABD-PELV W/ CM
2 of 5 series · 15 of 46 positions shown, 17 images · IV contrast (OMNIPAQUE)
Comparison: PET-CT [DATE] and CT scan [DATE]

CLINICAL DATA: Restaging gastrointestinal stromal tumor of the
stomach.

EXAM:
CT ABDOMEN AND PELVIS WITH CONTRAST
TECHNIQUE: Multidetector CT imaging of the abdomen and pelvis was performed
using the standard protocol following bolus administration of
intravenous contrast.
CONTRAST:  75mL OMNIPAQUE IOHEXOL 300 MG/ML  SOLN

[Series 2: axial st · axial · 0.87mm/px · z∈[-486,-101]mm · 12 of 89 slices shown, 14 images]
[im 6/89  soft-tissue]
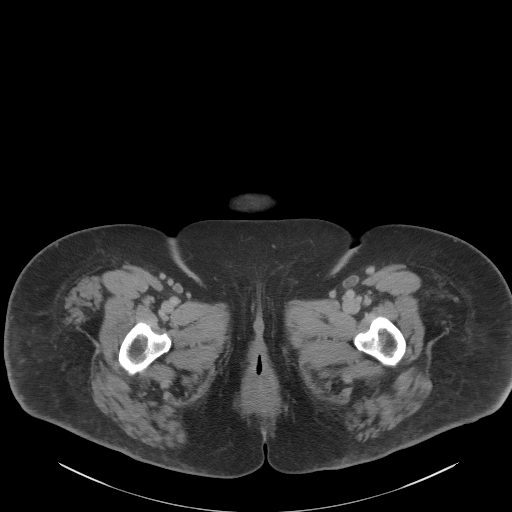
[im 6/89  bone]
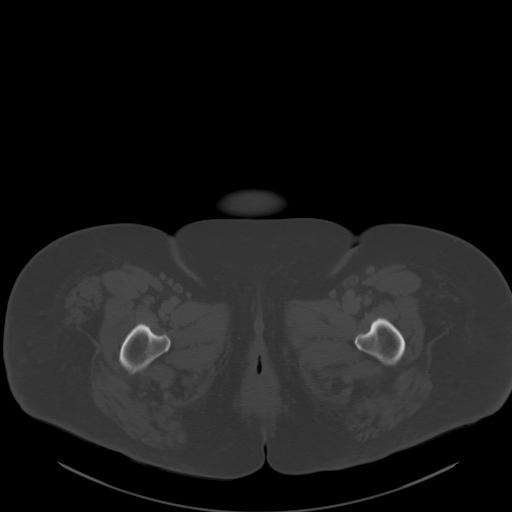
[im 12/89  soft-tissue]
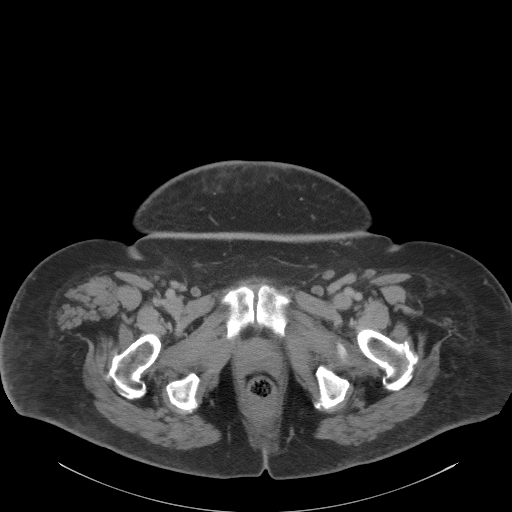
[im 23/89  soft-tissue]
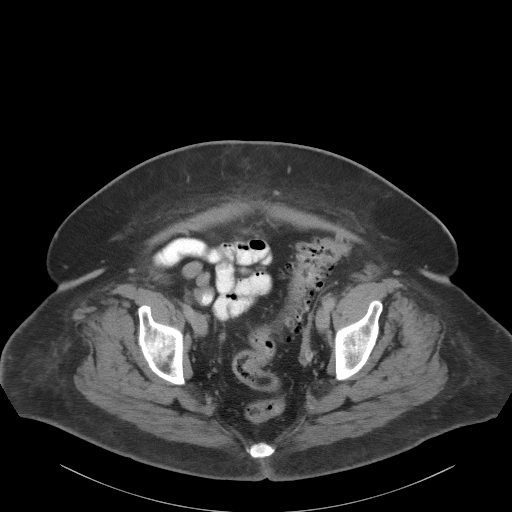
[im 28/89  soft-tissue]
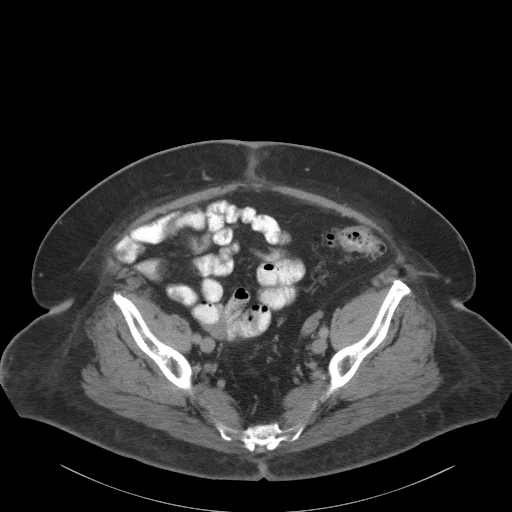
[im 34/89  soft-tissue]
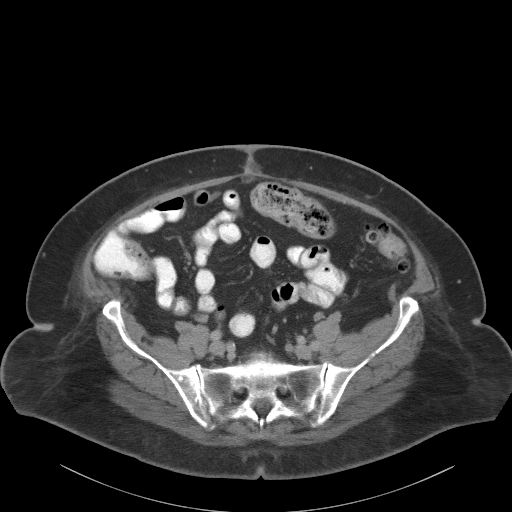
[im 39/89  soft-tissue]
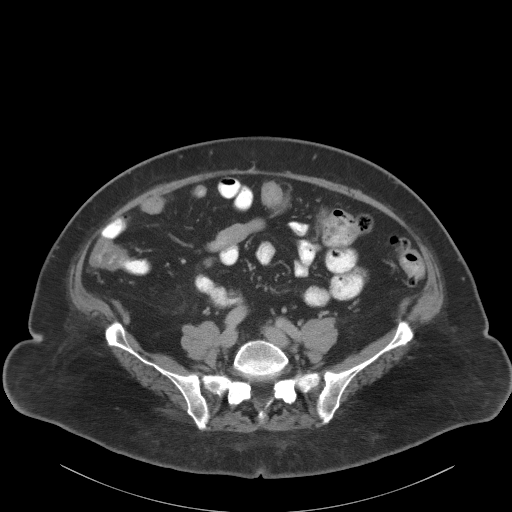
[im 50/89  soft-tissue]
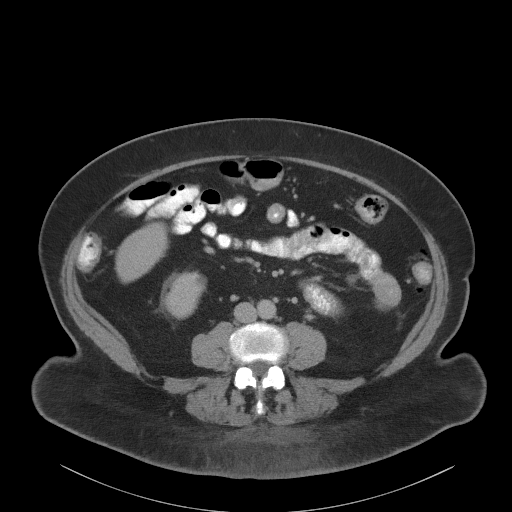
[im 56/89  soft-tissue]
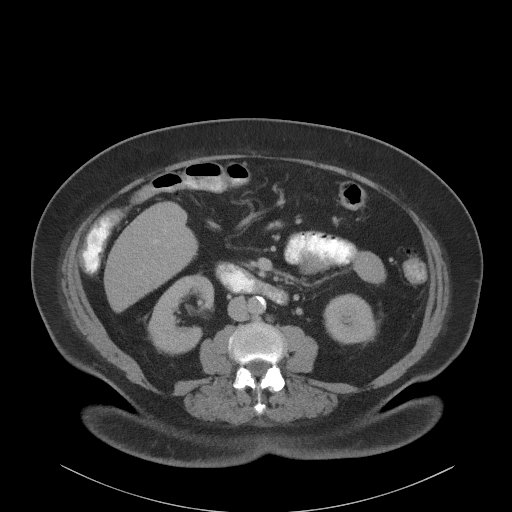
[im 61/89  soft-tissue]
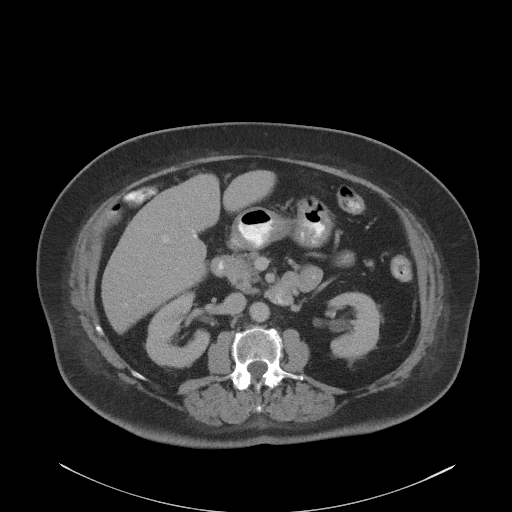
[im 61/89  bone]
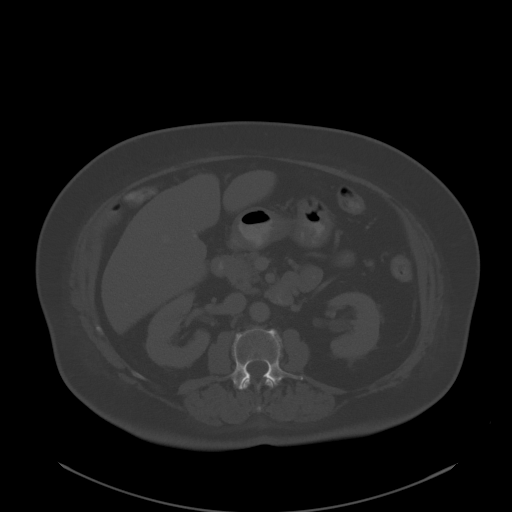
[im 67/89  soft-tissue]
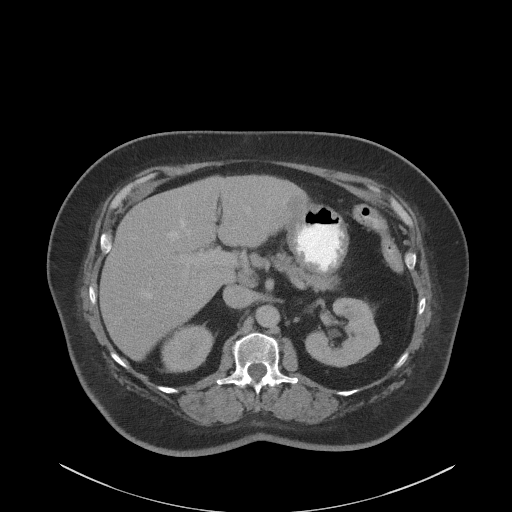
[im 78/89  soft-tissue]
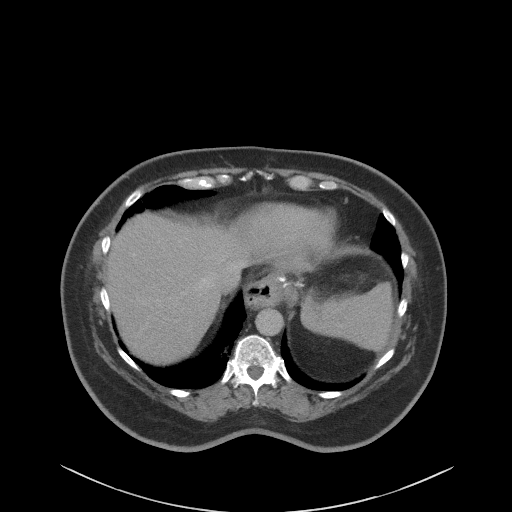
[im 83/89  soft-tissue]
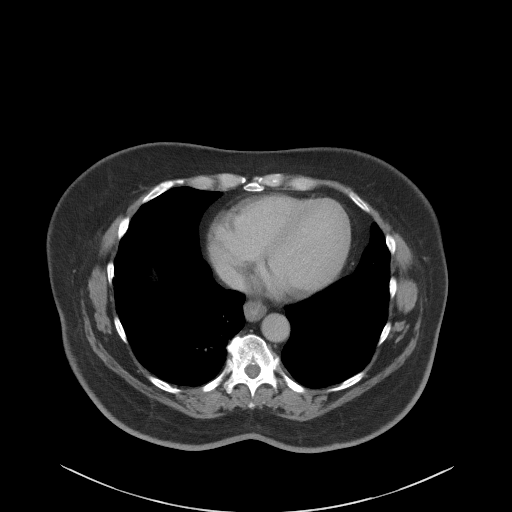

[Series 5: coronal st · coronal · 0.72mm/px · 3 of 100 slices shown]
[im 34/100  soft-tissue]
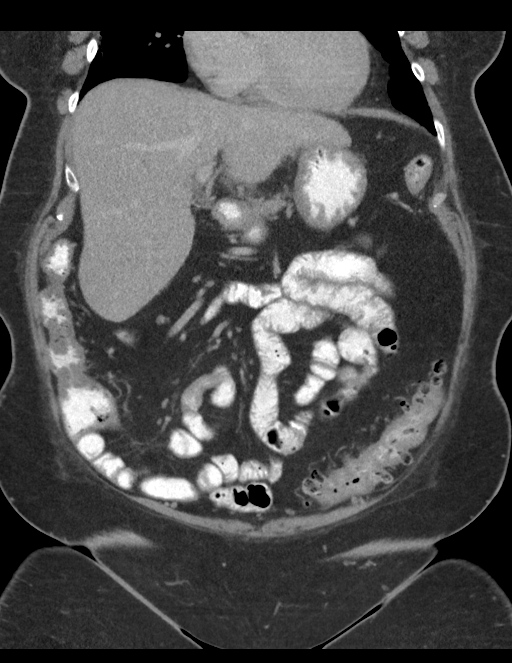
[im 45/100  soft-tissue]
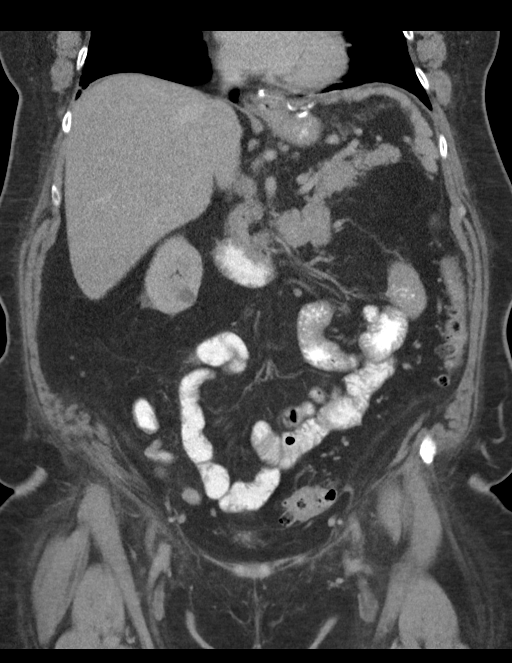
[im 56/100  soft-tissue]
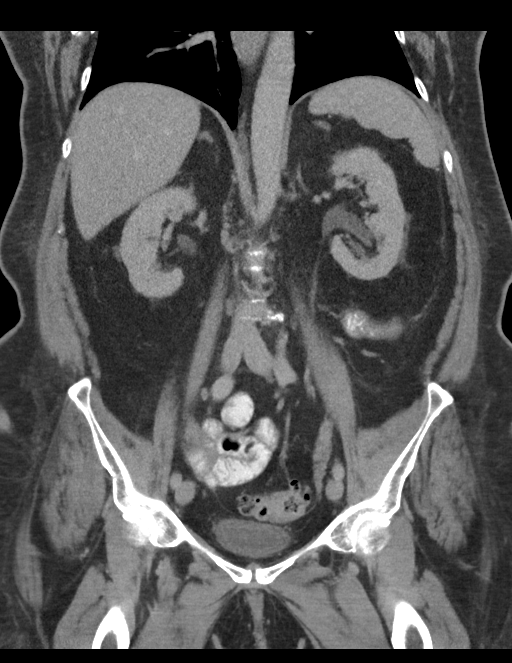

[15 of 46 positions shown; findings below may reference images not displayed]

FINDINGS: Lower chest: Stable patchy peripheral basilar scarring changes. No
worrisome pulmonary lesions or pleural effusion. The heart is within
normal limits in size. No pericardial effusion.

A hiatal hernia is noted.

Hepatobiliary: No hepatic lesions or intrahepatic biliary
dilatation. The gallbladder is surgically absent. No common bile
duct dilatation.

Pancreas: No mass, inflammation or ductal dilatation.

Spleen: Normal size.  No focal lesions.

Adrenals/Urinary Tract: Stable small left adrenal gland nodule
consistent with a benign adenoma. The right adrenal gland is normal.

Small renal cysts but no worrisome renal lesions or hydronephrosis.
The bladder is unremarkable.

Stomach/Bowel: Postoperative changes involving the stomach from the
GIST tumor resection. No findings suspicious for recurrent tumor.
The remainder of the stomach is unremarkable. The duodenum, small
bowel and colon are unremarkable. No acute inflammatory changes,
mass lesions or obstructive findings. Stable significant descending
colon and sigmoid colon diverticulosis without findings for acute
diverticulitis. The terminal ileum is normal. The appendix is
surgically absent.

Vascular/Lymphatic: Stable scattered atherosclerotic calcifications
involving the aorta and branch vessels. No aneurysm. The major
venous structures are patent.

No mesenteric or retroperitoneal mass or adenopathy.

Reproductive: The uterus is surgically absent. Both ovaries are
still present and appear normal.

Other: Small periumbilical abdominal wall hernia containing fat. No
ascites.

Musculoskeletal: No significant bony findings.
IMPRESSION: 1. Stable postoperative changes involving the stomach from the GIST
tumor resection. No findings suspicious for recurrent tumor,
locoregional adenopathy or metastatic disease.
2. Status post cholecystectomy. No biliary dilatation.
3. Stable small left adrenal gland adenoma.
4. Stable significant descending colon and sigmoid colon
diverticulosis without findings for acute diverticulitis.
5. Aortic atherosclerosis.

Aortic Atherosclerosis ([8Z]-[8Z]).

## 2020-08-31 MED ORDER — IOHEXOL 300 MG/ML  SOLN
75.0000 mL | Freq: Once | INTRAMUSCULAR | Status: AC | PRN
  Administered 2020-08-31: 75 mL via INTRAVENOUS

## 2020-09-01 NOTE — Progress Notes (Signed)
[place]   [phone] [phone]   Clinic Follow up Note   Patient Care Team: [name], MD as PCP - Charissa Bash, MD as Consulting Physician (Oncology) Jonnie Finner, RN as Oncology Nurse Navigator Stark Klein, MD as Consulting Physician (General Surgery) Milus Banister, MD as Attending Physician (Gastroenterology)  Date of Service:  09/04/2020  CHIEF COMPLAINT: F/u of GIST  SUMMARY OF ONCOLOGIC HISTORY: Oncology History Overview Note  Cancer Staging Malignant gastrointestinal stromal tumor (GIST) of stomach ([place]) Staging form: Gastrointestinal Stromal Tumor - Gastric and Omental GIST, AJCC 8th Edition - Pathologic stage from 02/29/2020: pT3, Mitotic Rate: High - Unsigned    Malignant gastrointestinal stromal tumor (GIST) of stomach ([place])  01/13/2020 Procedure   EGD/Upper Endoscopy by Dr Ardis Hughs 01/13/20  IMPRESSION - There was an ulcerated mass along the greater curvature of the stomach, proximal edge was about 5-6cm from the GE junction and the mass was 7-8cm long, 2-3cm wide. It was not circumferental. The mass was friable with spontaneous oozing, contact oozing, ulcerated and heaped up. Some parts of the mass were quite soft whereas other areas were more firm. This was very suspicious for a malignancy and it was biopsied extensively. There was mild distal gastritis, biopsied to check for H. pylori. - The examination was otherwise normal.   01/13/2020 Initial Biopsy   FINAL MICROSCOPIC DIAGNOSIS:  A. STOMACH, BIOPSY:  - High grade gastrointestinal stromal tumor, see comment.   B. STOMACH, DISTAL, BIOPSY:  - Mild reactive gastropathy with erosions.  - Warthin-Starry is negative for Helicobacter pylori.  - No intestinal metaplasia, dysplasia, or malignancy.  COMMENT:  A. The tumor has spindle and epithelioid components and appears high  grade (7 mitosis/20 hpf). Immunohistochemistry is positive for CD117 and  CD34. The  cells are negative for cytokeratin 20, cytokeratin 7,  cytokeratin 5/6, CDX-2, SMA, and desmin. The findings are consistent  with a gastrointestinal stromal tumor. Dr. Vic Ripper has reviewed the  case. Dr. Ardis Hughs' nurse was notified on 01/18/2020.     01/13/2020 Imaging   CT CAP W contrast 01/13/20  IMPRESSION: 1. 2.1 cm x 1.1 cm well-defined area of low attenuation within the soft tissues of the left breast. This may represent a lymph node, however, correlation with mammography is recommended. 2. 6.5 mm sclerotic focus within the posterior aspect of the T7 vertebral body. This may represent a bone island, however, correlation with follow-up abdomen pelvis CT is recommended to exclude the presence of a metastatic lesion. 3. Small hiatal hernia. 4. 2.6 cm x 2.0 cm fat containing umbilical hernia. 5. Aortic atherosclerosis.    01/23/2020 Initial Diagnosis   Malignant gastrointestinal stromal tumor (GIST) of stomach ([place])   01/26/2020 -  Chemotherapy   Gleevec 422m once daily starting 01/26/20    02/11/2020 PET scan   IMPRESSION: 1. Focal mass along the stomach fundus with maximum SUV 15.7, compatible with malignancy. No findings of metastatic spread. 2. Mildly increased sub solid nodularity in the left upper lobe with only very subtle associated activity, likely inflammatory. Incidental note is made of some secondary pulmonary lobular septal thickening in the lung apices, and mild interstitial edema is not excluded given the underlying cardiomegaly. 3. Other imaging findings of potential clinical significance: Aortic Atherosclerosis (ICD10-I70.0). Coronary atherosclerosis. Sigmoid colon diverticulosis. Small umbilical hernia contains adipose tissue. Physiologic activity along the costovertebral joints.   02/29/2020 Surgery   Partial gastrectomy by Dr. BBarry Dienes   02/29/2020 Pathology Results  FINAL MICROSCOPIC DIAGNOSIS:  A. STOMACH, PARTIAL GASTRECTOMY:  - High grade  gastrointestinal stromal tumor with dedifferentiation,  spanning approximately 8 cm  - Tumor infiltrates through most of the distal gastric wall and involves  the distal resection margin  - See oncology table  - See comment  Immunohistochemical stains show that the tumor cells are positive for  CD117, CD34 and SMA (patchy).  A component of the tumor shows very  high-grade features with loss of CD117 and CD34 expression, consistent  with dedifferentiation.  Tumor also shows areas with  rhabdomyosarcomatous differentiation with presence of desmin expression.    04/13/2020 Procedure   EUS  by Dr Ardis Hughs  IMPRESSION - The staple line from recent partial gastrectomy was noted in the proximal stomach, runs longitudinally along the gastric fundus. The mucosa immediately around the stable line was normal appearing however in the more distal gastric body (anterior wall likely) the mucosa was quite irregular appearing; friable, firm, slightly ulcerated for 4-5cm at least. This was not a discrete tumor, and these changes were about 5cm from the pylorus. I sampled this region extensively with forceps following EUS evaluation below.- The muscularis propria was abnormally thickened starting around the staple line and continuing throughout most of the stomach distally. This thickening was not circumferential.     FINAL MICROSCOPIC DIAGNOSIS:   A. GASTRIC BODY, BIOPSY:  - Gastric mucosa with changes consistent with reactive gastropathy.  - No spindle cell lesion, intestinal metaplasia, dysplasia or carcinoma.        CURRENT THERAPY:  Gleevec 41m once daily starting 01/27/20 B12 injection monthly starting 04/14/20.   INTERVAL HISTORY:  Bridget CULLOMis here for a follow up of GIST. She presents to the clinic alone. Doing well overall, she has more energy and has been more active lately  Appetite is good, eating well, no abdominal pain or other GI symptoms She is tolerating Gleevec well, no  noticeable side effects.   All other systems were reviewed with the patient and are negative.  MEDICAL HISTORY:  Past Medical History:  Diagnosis Date  . Anxiety   . Cancer [place]   stomach  . Complication of anesthesia   . Depression   . Hyperlipidemia   . Hypertension   . Migraine   . Osteopenia   . PONV (postoperative nausea and vomiting)   . Type II or unspecified type diabetes mellitus without mention of complication, uncontrolled 04/09/2013  . Upper GI bleed 01/13/2020    SURGICAL HISTORY: Past Surgical History:  Procedure Laterality Date  . ABDOMINAL HYSTERECTOMY    . APPENDECTOMY    . BIOPSY  01/13/2020   Procedure: BIOPSY;  Surgeon: JMilus Banister MD;  [facility];  Service: Endoscopy;;  . BIOPSY  04/13/2020   Procedure: BIOPSY;  Surgeon: JMilus Banister MD;  Location: [facility];  Service: Endoscopy;;  . CHOLECYSTECTOMY    . ESOPHAGOGASTRODUODENOSCOPY (EGD) WITH PROPOFOL N/A 01/13/2020   Procedure: ESOPHAGOGASTRODUODENOSCOPY (EGD) WITH PROPOFOL;  Surgeon: JMilus Banister MD;  [facility];  Service: Endoscopy;  Laterality: N/A;  . ESOPHAGOGASTRODUODENOSCOPY (EGD) WITH PROPOFOL N/A 04/13/2020   Procedure: ESOPHAGOGASTRODUODENOSCOPY (EGD) WITH PROPOFOL;  Surgeon: JMilus Banister MD;  Location: [facility] ENDOSCOPY;  Service: Endoscopy;  Laterality: N/A;  . EUS N/A 04/13/2020   Procedure: UPPER ENDOSCOPIC ULTRASOUND (EUS) LINEAR;  Surgeon: JMilus Banister MD;  Location: [facility]COPY;  Service: Endoscopy;  Laterality: N/A;  . PARTIAL GASTRECTOMY  02/28/2020    I have reviewed the  social history and family history with the patient and they are unchanged from previous note.  ALLERGIES:  has No Known Allergies.  MEDICATIONS:  Current Outpatient Medications  Medication Sig Dispense Refill  . atorvastatin (LIPITOR) 20 MG tablet TAKE 1 TABLET BY MOUTH EVERY DAY (Patient taking differently: Take 20 mg by mouth daily. ) 90 tablet 3  . diltiazem  (CARDIZEM CD) 240 MG 24 hr capsule TAKE 1 CAPSULE(240 MG) BY MOUTH DAILY (Patient taking differently: Take 240 mg by mouth daily. ) 90 capsule 3  . imatinib (GLEEVEC) 400 MG tablet TAKE 1 TABLET BY MOUTH ONCE DAILY AT THE SAME TIME WITH FOOD AND A LARGE GLASS OF WATER. DO NOT CRUSH TABLETS. AVOID GRAPEFRUIT PRODUCTS. 30 tablet 4  . lisinopril (ZESTRIL) 20 MG tablet Take 0.5 tablets (10 mg total) by mouth daily. 45 tablet 3  . metFORMIN (GLUCOPHAGE-XR) 500 MG 24 hr tablet Take 2 tablets (1,000 mg total) by mouth daily with breakfast. 180 tablet 3  . ondansetron (ZOFRAN ODT) 8 MG disintegrating tablet Take 1 tablet (8 mg total) by mouth every 8 (eight) hours as needed for nausea or vomiting. 30 tablet 2  . pantoprazole (PROTONIX) 40 MG tablet TAKE 1 TABLET(40 MG) BY MOUTH TWICE DAILY BEFORE A MEAL (Patient taking differently: Take 40 mg by mouth 2 (two) times daily before a meal. TAKE 1 TABLET(40 MG) BY MOUTH TWICE DAILY BEFORE A MEAL) 180 tablet 1   No current facility-administered medications for this visit.    PHYSICAL EXAMINATION: ECOG PERFORMANCE STATUS: 0 - Asymptomatic  Vitals:   09/04/20 1438  BP: (!) 162/64  Pulse: 71  Resp: 18  Temp: (!) 97.5 F (36.4 C)  SpO2: 98%   Filed Weights   09/04/20 1438  Weight: 190 lb 8 oz (86.4 kg)    GENERAL:alert, no distress and comfortable SKIN: skin color, texture, turgor are normal, no rashes or significant lesions EYES: normal, Conjunctiva are pink and non-injected, sclera clear NECK: supple, thyroid normal size, non-tender, without nodularity LYMPH:  no palpable lymphadenopathy in the cervical, axillary  LUNGS: clear to auscultation and percussion with normal breathing effort HEART: regular rate & rhythm and no murmurs and no lower extremity edema ABDOMEN:abdomen soft, non-tender and normal bowel sounds Musculoskeletal:no cyanosis of digits and no clubbing  NEURO: alert & oriented x 3 with fluent speech, no focal motor/sensory  deficits  LABORATORY DATA:  I have reviewed the data as listed CBC Latest Ref Rng & Units 08/31/2020 08/07/2020 07/07/2020  WBC 4.0 - 10.5 K/uL 7.6 6.2 5.4  Hemoglobin 12.0 - 15.0 g/dL 11.0(L) 11.7(L) 11.4(L)  Hematocrit 36.0 - 46.0 % 32.2(L) 34.7(L) 33.2(L)  Platelets 150 - 400 K/uL 224 225 202     CMP Latest Ref Rng & Units 08/31/2020 08/07/2020 07/07/2020  Glucose 70 - 99 mg/dL 102(H) 146(H) 159(H)  BUN 8 - 23 mg/dL 11 14 15   Creatinine 0.44 - 1.00 mg/dL 0.79 0.95 0.99  Sodium 135 - 145 mmol/L 142 141 140  Potassium 3.5 - 5.1 mmol/L 3.6 4.1 4.3  Chloride 98 - 111 mmol/L 104 104 105  CO2 22 - 32 mmol/L 28 26 27   Calcium 8.9 - 10.3 mg/dL 9.3 9.2 8.7(L)  Total Protein 6.5 - 8.1 g/dL 7.0 7.4 6.9  Total Bilirubin 0.3 - 1.2 mg/dL 0.5 0.4 0.4  Alkaline Phos 38 - 126 U/L 63 67 73  AST 15 - 41 U/L 18 16 16   ALT 0 - 44 U/L 15 14 14  RADIOGRAPHIC STUDIES: I have personally reviewed the radiological images as listed and agreed with the findings in the report. No results found.   ASSESSMENT & PLAN:  Bridget Grant is a 75 y.o. female with    1.GastricGIST, ypT3NxM0, High grade, Kit mutation (+) -She was diagnosed in 8/2021withHigh grade gastrointestinal stromal tumor.  -Her stagingCT CAP from 01/13/20 did not show gastric mass (I think the greater curvature of gastric wall is thickened)but did show incidental findings of left breast mass and 6.16msclerotic lesion at T7. -Given her bleeding tumor, large size and other work up, Istarted her onGleevec4074mon 01/27/20, prior to surgery.  -She underwent surgery on 02/29/20 with Dr ByBarry DienesSurgical pathshows high-grade GIST spanning 8 cm infiltrating through most of the distal gastric wall with a positive distal resection margin,staged pT3. Pathologyalsoshows areas of rhabdomyosarcomatousdifferentiation but is not felt to represent a mixed rhabdomyosarcoma/GIST tumor. Unfortunately, given these features she is at very high  risk for recurrence and metastasis. -She underwent EUS on 04/13/20 which showed no evidence of malignancy or GIST. Although biopsy does not show GIST, Dr JaArdis Hughsas concern for residual disease. Will monitor her with scan every 6-12 months and repeat endoscopyevery3-6 months.  -she is on adjuvant Imatinib, will continue, she is tolerating well  -She is clinically doing well, asymptomatic.  Lab reviewed, mild anemia.  Exam was unremarkable, there is no clinical concern for recurrence. -I personally reviewed her surveillance CT scan from August 31, 2020, which showed stable surgical change, no evidence of recurrence.  I reviewed with patient. -She is scheduled to have EGD in May with Dr. JaArdis HughsLab and follow-up in 3 months   2. Anemia secondary to GI bleeding from #1, B12 deficiency  -She was hospitalized on 01/12/20 due to severe anemia with Hg 7.9.  -She was treated with blood transfusion on 8/9/21and 9/27/21and IV Feraheme on 8/7/21and 02/09/20 -continue B12 injection monthly    3. T7 Spinal Lesion  -Her 01/13/20 CT scan also shows a6.5 mm sclerotic focus within the posterior aspect of the T7 vertebral body. -Prior PET scan was negative for bone mets -she is asymptomatic  -no bone lesion on CT 08/31/2020   PLAN: -Proceed with B12 injection today and continue monthly  -Continue Gleevec 40075mnce daily, refilled last week -she will proceed with EGD in mid May  -lab and f/u in 3 months  -copy Drs. ByeMajel Homer No problem-specific Assessment & Plan notes found for this encounter.   No orders of the defined types were placed in this encounter.  All questions were answered. The patient knows to call the clinic with any problems, questions or concerns. No barriers to learning was detected. The total time spent in the appointment was 30 minutes.     YanTruitt MerleD 09/04/2020   I, AmoJoslyn Devonm acting as scribe for YanTruitt MerleD.   I have reviewed the above  documentation for accuracy and completeness, and I agree with the above.

## 2020-09-04 ENCOUNTER — Inpatient Hospital Stay: Payer: Medicare Other

## 2020-09-04 ENCOUNTER — Inpatient Hospital Stay (HOSPITAL_BASED_OUTPATIENT_CLINIC_OR_DEPARTMENT_OTHER): Payer: Medicare Other | Admitting: Hematology

## 2020-09-04 ENCOUNTER — Other Ambulatory Visit: Payer: Self-pay

## 2020-09-04 ENCOUNTER — Telehealth: Payer: Self-pay | Admitting: Hematology

## 2020-09-04 ENCOUNTER — Encounter: Payer: Self-pay | Admitting: Hematology

## 2020-09-04 VITALS — BP 162/64 | HR 71 | Temp 97.5°F | Resp 18 | Ht 62.0 in | Wt 190.5 lb

## 2020-09-04 DIAGNOSIS — D500 Iron deficiency anemia secondary to blood loss (chronic): Secondary | ICD-10-CM | POA: Diagnosis not present

## 2020-09-04 DIAGNOSIS — C49A2 Gastrointestinal stromal tumor of stomach: Secondary | ICD-10-CM

## 2020-09-04 DIAGNOSIS — E538 Deficiency of other specified B group vitamins: Secondary | ICD-10-CM | POA: Diagnosis not present

## 2020-09-04 MED ORDER — CYANOCOBALAMIN 1000 MCG/ML IJ SOLN
1000.0000 ug | Freq: Once | INTRAMUSCULAR | Status: AC
  Administered 2020-09-04: 1000 ug via INTRAMUSCULAR

## 2020-09-04 MED ORDER — ONDANSETRON 8 MG PO TBDP: 8.0000 mg | ORAL_TABLET | Freq: Three times a day (TID) | ORAL | 2 refills | Status: DC | PRN

## 2020-09-04 MED ORDER — CYANOCOBALAMIN 1000 MCG/ML IJ SOLN
INTRAMUSCULAR | Status: AC
  Filled 2020-09-04: qty 1

## 2020-09-04 NOTE — Telephone Encounter (Signed)
Scheduled per los. Gave avs and calendar  

## 2020-09-04 NOTE — Patient Instructions (Signed)

## 2020-09-05 ENCOUNTER — Telehealth: Payer: Self-pay | Admitting: Hematology

## 2020-09-05 NOTE — Telephone Encounter (Signed)
Scheduled appts per 3/28 sch msg. Pt aware.  

## 2020-10-03 ENCOUNTER — Other Ambulatory Visit: Payer: Self-pay | Admitting: Internal Medicine

## 2020-10-03 MED ORDER — PANTOPRAZOLE SODIUM 40 MG PO TBEC: 40.0000 mg | DELAYED_RELEASE_TABLET | Freq: Two times a day (BID) | ORAL | 2 refills | Status: DC

## 2020-10-05 ENCOUNTER — Inpatient Hospital Stay: Payer: Medicare Other | Attending: Hematology

## 2020-10-05 ENCOUNTER — Other Ambulatory Visit: Payer: Self-pay

## 2020-10-05 DIAGNOSIS — D500 Iron deficiency anemia secondary to blood loss (chronic): Secondary | ICD-10-CM

## 2020-10-05 DIAGNOSIS — E538 Deficiency of other specified B group vitamins: Secondary | ICD-10-CM | POA: Diagnosis not present

## 2020-10-05 MED ORDER — CYANOCOBALAMIN 1000 MCG/ML IJ SOLN
INTRAMUSCULAR | Status: AC
  Filled 2020-10-05: qty 1

## 2020-10-05 MED ORDER — CYANOCOBALAMIN 1000 MCG/ML IJ SOLN
1000.0000 ug | Freq: Once | INTRAMUSCULAR | Status: AC
  Administered 2020-10-05: 1000 ug via INTRAMUSCULAR

## 2020-10-06 ENCOUNTER — Encounter (HOSPITAL_COMMUNITY): Payer: Self-pay | Admitting: Gastroenterology

## 2020-10-09 ENCOUNTER — Other Ambulatory Visit (HOSPITAL_COMMUNITY)
Admission: RE | Admit: 2020-10-09 | Discharge: 2020-10-09 | Disposition: A | Discharge status: Home / Self Care | Payer: Medicare Other | Source: Ambulatory Visit | Attending: Gastroenterology | Admitting: Gastroenterology

## 2020-10-09 DIAGNOSIS — Z01812 Encounter for preprocedural laboratory examination: Secondary | ICD-10-CM | POA: Insufficient documentation

## 2020-10-09 DIAGNOSIS — Z20822 Contact with and (suspected) exposure to covid-19: Secondary | ICD-10-CM | POA: Diagnosis not present

## 2020-10-10 LAB — SARS CORONAVIRUS 2 (TAT 6-24 HRS): SARS Coronavirus 2: NEGATIVE

## 2020-10-11 ENCOUNTER — Encounter (HOSPITAL_COMMUNITY): Payer: Self-pay | Admitting: Gastroenterology

## 2020-10-11 NOTE — Anesthesia Preprocedure Evaluation (Addendum)
Anesthesia Evaluation  Patient identified by MRN, date of birth, ID band Patient awake    Reviewed: Allergy & Precautions, NPO status , Patient's Chart, lab work & pertinent test results  History of Anesthesia Complications (+) PONV and history of anesthetic complications  Airway Mallampati: II  TM Distance: >3 FB Neck ROM: Full    Dental  (+) Chipped, Dental Advisory Given,    Pulmonary neg pulmonary ROS,    breath sounds clear to auscultation       Cardiovascular hypertension, Pt. on medications  Rhythm:Regular Rate:Normal     Neuro/Psych  Headaches, PSYCHIATRIC DISORDERS Anxiety Depression    GI/Hepatic negative GI ROS, Neg liver ROS,   Endo/Other  diabetes, Type 2, Oral Hypoglycemic Agents  Renal/GU negative Renal ROS     Musculoskeletal  (+) Arthritis ,   Abdominal Normal abdominal exam  (+)   Peds  Hematology negative hematology ROS (+)   Anesthesia Other Findings   Reproductive/Obstetrics                            Anesthesia Physical Anesthesia Plan  ASA: II  Anesthesia Plan: General   Post-op Pain Management:    Induction: Intravenous  PONV Risk Score and Plan: 0 and Propofol infusion  Airway Management Planned: Natural Airway and Simple Face Mask  Additional Equipment: None  Intra-op Plan:   Post-operative Plan:   Informed Consent: I have reviewed the patients History and Physical, chart, labs and discussed the procedure including the risks, benefits and alternatives for the proposed anesthesia with the patient or authorized representative who has indicated his/her understanding and acceptance.       Plan Discussed with: CRNA  Anesthesia Plan Comments:        Anesthesia Quick Evaluation

## 2020-10-12 ENCOUNTER — Ambulatory Visit (HOSPITAL_COMMUNITY)
Admission: RE | Admit: 2020-10-12 | Discharge: 2020-10-12 | Disposition: A | Discharge status: Home / Self Care | Payer: Medicare Other | Attending: Gastroenterology | Admitting: Gastroenterology

## 2020-10-12 ENCOUNTER — Encounter (HOSPITAL_COMMUNITY): Payer: Self-pay | Admitting: Gastroenterology

## 2020-10-12 ENCOUNTER — Other Ambulatory Visit: Payer: Self-pay

## 2020-10-12 ENCOUNTER — Ambulatory Visit (HOSPITAL_COMMUNITY): Payer: Medicare Other | Admitting: Certified Registered"

## 2020-10-12 ENCOUNTER — Encounter (HOSPITAL_COMMUNITY)
Admission: RE | Disposition: A | Discharge status: Home / Self Care | Payer: Self-pay | Source: Home / Self Care | Attending: Gastroenterology

## 2020-10-12 DIAGNOSIS — R591 Generalized enlarged lymph nodes: Secondary | ICD-10-CM

## 2020-10-12 DIAGNOSIS — K2960 Other gastritis without bleeding: Secondary | ICD-10-CM

## 2020-10-12 DIAGNOSIS — C49A2 Gastrointestinal stromal tumor of stomach: Secondary | ICD-10-CM | POA: Diagnosis not present

## 2020-10-12 DIAGNOSIS — Z980 Intestinal bypass and anastomosis status: Secondary | ICD-10-CM | POA: Insufficient documentation

## 2020-10-12 DIAGNOSIS — Z8719 Personal history of other diseases of the digestive system: Secondary | ICD-10-CM | POA: Diagnosis not present

## 2020-10-12 DIAGNOSIS — Z09 Encounter for follow-up examination after completed treatment for conditions other than malignant neoplasm: Secondary | ICD-10-CM | POA: Insufficient documentation

## 2020-10-12 DIAGNOSIS — C49A0 Gastrointestinal stromal tumor, unspecified site: Secondary | ICD-10-CM

## 2020-10-12 HISTORY — PX: ESOPHAGOGASTRODUODENOSCOPY (EGD) WITH PROPOFOL: SHX5813

## 2020-10-12 HISTORY — PX: EUS: SHX5427

## 2020-10-12 LAB — GLUCOSE, CAPILLARY: Glucose-Capillary: 124 mg/dL — ABNORMAL HIGH (ref 70–99)

## 2020-10-12 SURGERY — UPPER ENDOSCOPIC ULTRASOUND (EUS) RADIAL
Anesthesia: Monitor Anesthesia Care

## 2020-10-12 MED ORDER — PROPOFOL 500 MG/50ML IV EMUL
INTRAVENOUS | Status: DC | PRN
  Administered 2020-10-12: 125 ug/kg/min via INTRAVENOUS

## 2020-10-12 MED ORDER — LACTATED RINGERS IV SOLN: INTRAVENOUS | Status: DC

## 2020-10-12 MED ORDER — PROPOFOL 10 MG/ML IV BOLUS
INTRAVENOUS | Status: AC
  Filled 2020-10-12: qty 20

## 2020-10-12 MED ORDER — SODIUM CHLORIDE 0.9 % IV SOLN: INTRAVENOUS | Status: DC

## 2020-10-12 MED ORDER — PROPOFOL 1000 MG/100ML IV EMUL
INTRAVENOUS | Status: AC
  Filled 2020-10-12: qty 100

## 2020-10-12 MED ORDER — PROPOFOL 10 MG/ML IV BOLUS
INTRAVENOUS | Status: DC | PRN
  Administered 2020-10-12: 20 mg via INTRAVENOUS
  Administered 2020-10-12: 10 mg via INTRAVENOUS

## 2020-10-12 NOTE — Discharge Instructions (Signed)

## 2020-10-12 NOTE — Anesthesia Postprocedure Evaluation (Signed)
Anesthesia Post Note  Patient: Bridget Grant  Procedure(s) Performed: UPPER ENDOSCOPIC ULTRASOUND (EUS) RADIAL (N/A )     Patient location during evaluation: PACU Anesthesia Type: MAC Level of consciousness: awake and alert Pain management: pain level controlled Vital Signs Assessment: post-procedure vital signs reviewed and stable Respiratory status: spontaneous breathing, nonlabored ventilation, respiratory function stable and patient connected to nasal cannula oxygen Cardiovascular status: stable and blood pressure returned to baseline Postop Assessment: no apparent nausea or vomiting Anesthetic complications: no   No complications documented.  Last Vitals:  Vitals:   10/12/20 0810 10/12/20 0820  BP: (!) 160/83 (!) 178/62  Pulse: 71 64  Resp: 16 11  Temp:    SpO2: 98% 97%    Last Pain:  Vitals:   10/12/20 0820  TempSrc:   PainSc: 0-No pain                 Effie Berkshire

## 2020-10-12 NOTE — Anesthesia Procedure Notes (Signed)
Procedure Name: MAC Date/Time: 10/12/2020 7:30 AM Performed by: Eben Burow, CRNA Pre-anesthesia Checklist: Patient identified, Emergency Drugs available, Suction available, Patient being monitored and Timeout performed Oxygen Delivery Method: Simple face mask Placement Confirmation: positive ETCO2

## 2020-10-12 NOTE — H&P (Signed)
GastricGIST,ypT3NxM0, High grade, Kit mutation (+) -She was diagnosed in 8/2021withHigh grade gastrointestinal stromal tumor.  -Her stagingCT CAP from 01/13/20 did not show gastric mass (I think the greater curvature of gastric wall is thickened)but did show incidental findings of left breast mass and 6.92msclerotic lesion at T7. -Given her bleeding tumor, large size and other work up, Istarted her onGleevec403mon 01/27/20, prior to surgery.  -She underwent surgery on 02/29/20 with Dr ByBarry DienesSurgical pathshows high-grade GIST spanning 8 cm infiltrating through most of the distal gastric wall with a positive distal resection margin,staged pT3. Pathologyalsoshows areas of rhabdomyosarcomatousdifferentiation but is not felt to represent a mixed rhabdomyosarcoma/GIST tumor. Unfortunately, given these features she is at very high risk for recurrence and metastasis. -She underwent EUS on 04/13/20 which showed no evidence of malignancy or GIST. Although biopsy does not show GIST, Dr JaArdis Hughsas concern for residual disease.Will monitor her with scan every 6-12 months and repeat endoscopyevery3-93m21month -she is on adjuvant Imatinib, will continue, she is tolerating well -She is clinically doing well, asymptomatic.  Lab reviewed, mild anemia.  Exam was unremarkable, there is no clinical concern for recurrence. -I personally reviewed her surveillance CT scan from August 31, 2020, which showed stable surgical change, no evidence of recurrence.  I reviewed with patient. -She is scheduled to have EGD in May with Dr. JacArdis Hughsab and follow-up in 3 months   HPI: This is a woman with high grade spindle cell tumor of stomach    ROS: complete GI ROS as described in HPI, all other review negative.  Constitutional:  No unintentional weight loss   Past Medical History:  Diagnosis Date  . Anxiety   . [place]   st[place]  . Complication of anesthesia   . Depression   .  Hyperlipidemia   . Hypertension   . Migraine   . Osteopenia   . PONV (postoperative nausea and vomiting)   . Type II or unspecified type diabetes mellitus without mention of complication, uncontrolled 04/09/2013  . Upper GI bleed 01/13/2020    Past Surgical History:  Procedure Laterality Date  . ABDOMINAL HYSTERECTOMY    . APPENDECTOMY    . BIOPSY  01/13/2020   Procedure: BIOPSY;  Surgeon: JacMilus BanisterD;  [facility];  Service: Endoscopy;;  . BIOPSY  04/13/2020   Procedure: BIOPSY;  Surgeon: JacMilus BanisterD;  [facility];  Service: Endoscopy;;  . CHOLECYSTECTOMY    . ESOPHAGOGASTRODUODENOSCOPY (EGD) WITH PROPOFOL N/A 01/13/2020   Procedure: ESOPHAGOGASTRODUODENOSCOPY (EGD) WITH PROPOFOL;  Surgeon: JacMilus BanisterD;  [facility];  Service: Endoscopy;  Laterality: N/A;  . ESOPHAGOGASTRODUODENOSCOPY (EGD) WITH PROPOFOL N/A 04/13/2020   Procedure: ESOPHAGOGASTRODUODENOSCOPY (EGD) WITH PROPOFOL;  Surgeon: JacMilus BanisterD;  [facility];  Service: Endoscopy;  Laterality: N/A;  . EUS N/A 04/13/2020   Procedure: UPPER ENDOSCOPIC ULTRASOUND (EUS) LINEAR;  Surgeon: JacMilus BanisterD;  [facility];  Service: Endoscopy;  Laterality: N/A;  . PARTIAL GASTRECTOMY  02/28/2020    Current Facility-Administered Medications  Medication Dose Route Frequency Provider Last Rate Last Admin  . 0.9 %  sodium chloride infusion   Intravenous Continuous JacMilus BanisterD      . lactated ringers infusion   Intravenous Continuous JacMilus BanisterD 20 mL/hr at 10/12/20 0704 New Bag at 10/12/20 0704    Allergies as of 08/08/2020  . (No Known Allergies)    Family History  Problem Relation Age of Onset  .  Cancer Mother 63       breast cancer and ovarian cancer   . Breast cancer Mother 62  . Hypertension Father   . Cancer Maternal Grandmother        unknown type of cancer     Social History   Socioeconomic History  . Marital  status: Married    Spouse name: Not on file  . Number of children: 0  . Years of education: Not on file  . Highest education level: Not on file  Occupational History  . Occupation: retired   Tobacco Use  . Smoking status: Never Smoker  . Smokeless tobacco: Never Used  Vaping Use  . Vaping Use: Never used  Substance and Sexual Activity  . Alcohol use: No  . Drug use: No  . Sexual activity: Not on file  Other Topics Concern  . Not on file  Social History Narrative  . Not on file   Social Determinants of Health   Financial Resource Strain: Not on file  Food Insecurity: Not on file  Transportation Needs: Not on file  Physical Activity: Not on file  Stress: Not on file  Social Connections: Not on file  Intimate Partner Violence: Not on file     Physical Exam: BP (!) 171/71   Pulse 74   Temp 99 F (37.2 C) (Oral)   Resp (!) 21   Ht 5' 2"  (1.575 m)   Wt 86.2 kg   SpO2 100%   BMI 34.75 kg/m  Constitutional: generally well-appearing Psychiatric: alert and oriented x3 Abdomen: soft, nontender, nondistended, no obvious ascites, no peritoneal signs, normal bowel sounds No peripheral edema noted in lower extremities  Assessment and plan: 75 y.o. female with high grade spindle cell tumor of stomach  For upper EGD, EUS evaluation today  Please see the "Patient Instructions" section for addition details about the plan.  Owens Loffler, MD [facility] 10/12/2020, 7:09 AM

## 2020-10-12 NOTE — Op Note (Signed)
[facility] Patient Name: Bridget Grant Procedure Date: 10/12/2020 MRN: [id number] Attending MD: Milus Banister , MD Date of Birth: 05-08-1946 CSN: [id number] Age: 75 Admit Type: Emergency Department Procedure:                Upper EUS Indications:              High grade, bleeding gastric GIST. s/p Gleevac,                            then gastric wedge resection with + margin.                            Adjuvant chemo. EUS/EGD 04/2020 was concerning for                            residual/recurrence however gastric biopsies showed                            no sign of neoplastic cells. CT 08/2020 no obvious                            residual/recurrent tumor. She's felt well. Providers:                Milus Banister, MD, Grace Isaac, RN, Cherylynn Ridges, Technician, Jefm Miles CRNA Referring MD:             Truitt Merle, MD Medicines:                Monitored Anesthesia Care Complications:            No immediate complications. Estimated blood loss:                            None. Estimated Blood Loss:     Estimated blood loss: none. Procedure:                Pre-Anesthesia Assessment:                           - Prior to the procedure, a History and Physical                            was performed, and patient medications and                            allergies were reviewed. The patient's tolerance of                            previous anesthesia was also reviewed. The risks                            and benefits of the procedure and the sedation  options and risks were discussed with the patient.                            All questions were answered, and informed consent                            was obtained. Prior Anticoagulants: The patient has                            taken no previous anticoagulant or antiplatelet                            agents. ASA Grade Assessment: II - A patient with                             mild systemic disease. After reviewing the risks                            and benefits, the patient was deemed in                            satisfactory condition to undergo the procedure.                           After obtaining informed consent, the endoscope was                            passed under direct vision. Throughout the                            procedure, the patient's blood pressure, pulse, and                            oxygen saturations were monitored continuously. The                            [id number]-[id number] ([id number]) Olympus gastroscope was                            introduced through the mouth, and advanced to the                            second part of duodenum. The [id number]-[id number] ([id number])                            Olympus Radial EUS was introduced through the                            mouth, and advanced to the second part of duodenum.                            The upper EUS was accomplished without difficulty.  The patient tolerated the procedure well. Scope In: Scope Out: Findings:      ENDOSCOPIC FINDING: :      The examined esophagus was endoscopically normal.      The overall appearance of the stomach, s/p wedge resection was [facility]       IMPROVED from 04/2020 EGD/EUS. The wedge 'anastomosis' was normal       appearing. The previously ulcerated mucosa was normal now. There was       very mild, non-specific pan-gastritis. Given the significantly improved       appearance, no biopsies were taken.      The examined duodenum was endoscopically normal.      ENDOSONOGRAPHIC FINDING: :      1. The area of the wedge resection anastomosis was irregular       anatomically, related to post operative changes without any unusually       thickened gastric layers or masses.      2. No perigastric adenopathy.      3. Limited views of the pancreas, liver, spleen, portal and splenic       vessels were all normal. Impression:                - The overall endoscopic appearance of the stomach,                            s/p wedge resection was [facility] IMPROVED from 04/2020                            EGD/EUS. The wedge 'anastomosis' was normal                            appearing. The previously ulcerated mucosa was                            normal now. There was very mild, non-specific                            pan-gastritis. Given the significantly improved                            appearance, no biopsies were taken. EUS evaluation                            reveals expected anatomically abnormal findings at                            the wedge resection site (related to post operative                            changes) but no significanly thickened gastric                            layers or masses. Moderate Sedation:      Not Applicable - Patient had care per Anesthesia. Recommendation:           - Discharge patient to home.                           -  Timing, necessity of next EGD/EUS evaluation per                            medical oncology. Procedure Code(s):        --- Professional ---                           [id number], Esophagogastroduodenoscopy, flexible,                            transoral; with endoscopic ultrasound examination                            limited to the esophagus, stomach or duodenum, and                            adjacent structures Diagnosis Code(s):        --- Professional ---                           R59.1, Generalized enlarged lymph nodes CPT copyright 2019 American Medical Association. All rights reserved. The codes documented in this report are preliminary and upon coder review may  be revised to meet current compliance requirements. Milus Banister, MD 10/12/2020 8:10:37 AM This report has been signed electronically. Number of Addenda: 0

## 2020-10-12 NOTE — Transfer of Care (Signed)
Immediate Anesthesia Transfer of Care Note  Patient: Bridget Grant  Procedure(s) Performed: UPPER ENDOSCOPIC ULTRASOUND (EUS) RADIAL (N/A )  Patient Location: PACU and Endoscopy Unit  Anesthesia Type:MAC  Level of Consciousness: awake, alert  and patient cooperative  Airway & Oxygen Therapy: Patient Spontanous Breathing and Patient connected to face mask oxygen  Post-op Assessment: Report given to RN and Post -op Vital signs reviewed and stable  Post vital signs: Reviewed and stable  Last Vitals:  Vitals Value Taken Time  BP    Temp    Pulse 69 10/12/20 0803  Resp 17 10/12/20 0803  SpO2 100 % 10/12/20 0803  Vitals shown include unvalidated device data.  Last Pain:  Vitals:   10/12/20 0652  TempSrc: Oral  PainSc: 0-No pain         Complications: No complications documented.

## 2020-10-13 ENCOUNTER — Encounter (HOSPITAL_COMMUNITY): Payer: Self-pay | Admitting: Gastroenterology

## 2020-10-17 ENCOUNTER — Telehealth: Payer: Self-pay

## 2020-10-17 NOTE — Telephone Encounter (Signed)
The pt has been advised and recall entered.   

## 2020-10-17 NOTE — Telephone Encounter (Signed)
-----   Message from Milus Banister, MD sent at 10/17/2020  7:37 AM EDT ----- OK, I'll plan on 1 year repeat EGD.  Thanks  Erina Hamme, Can you let her know what was decided and put in for recall EGD/EUS in 1 year.  Thanks   ----- Message ----- From: Stark Klein, MD Sent: 10/16/2020   7:58 AM EDT To: Milus Banister, MD, Truitt Merle, MD  Sounds good.  Fb  ----- Message ----- From: Truitt Merle, MD Sent: 10/12/2020  10:42 AM EDT To: Milus Banister, MD, Stark Klein, MD  That's good news.  I think repeating EGD in 6-12 months, Faera, what do yo think?  Thanks   Krista Blue  ----- Message ----- From: Milus Banister, MD Sent: 10/12/2020   8:12 AM EDT To: Truitt Merle, MD  Krista Blue, Just completed her surveillance EGD/EUS. See full report in Epic.  Not sure if/when this should be repeated, let me know what you think.  Thanks  DJ    - The overall endoscopic appearance of the stomach, s/p wedge resection was [facility] IMPROVED from 04/2020 EGD/EUS.  The wedge 'anastomosis' was normal appearing. The previously ulcerated mucosa was normal now. There was very mild, non-specific pan-gastritis.  Given the significantly improved appearance, no biopsies were taken.  EUS evaluation reveals expected anatomically abnormal findings at the wedge resection site (related to post operative changes) but no significanly thickened gastric layers or masses.

## 2020-11-03 ENCOUNTER — Inpatient Hospital Stay: Payer: Medicare Other | Attending: Hematology

## 2020-11-03 ENCOUNTER — Other Ambulatory Visit: Payer: Self-pay

## 2020-11-03 DIAGNOSIS — D500 Iron deficiency anemia secondary to blood loss (chronic): Secondary | ICD-10-CM

## 2020-11-03 DIAGNOSIS — E538 Deficiency of other specified B group vitamins: Secondary | ICD-10-CM | POA: Diagnosis not present

## 2020-11-03 MED ORDER — CYANOCOBALAMIN 1000 MCG/ML IJ SOLN
1000.0000 ug | Freq: Once | INTRAMUSCULAR | Status: AC
Start: 2020-11-03 — End: 2020-11-03
  Administered 2020-11-03: 1000 ug via INTRAMUSCULAR

## 2020-11-03 MED ORDER — CYANOCOBALAMIN 1000 MCG/ML IJ SOLN
INTRAMUSCULAR | Status: AC
  Filled 2020-11-03: qty 1

## 2020-11-03 NOTE — Patient Instructions (Signed)

## 2020-11-15 ENCOUNTER — Other Ambulatory Visit: Payer: Self-pay

## 2020-11-15 ENCOUNTER — Encounter: Payer: Self-pay | Admitting: Internal Medicine

## 2020-11-15 ENCOUNTER — Ambulatory Visit (INDEPENDENT_AMBULATORY_CARE_PROVIDER_SITE_OTHER): Payer: Medicare Other | Admitting: Internal Medicine

## 2020-11-15 VITALS — BP 158/74 | HR 79 | Temp 98.6°F | Ht 62.0 in | Wt 183.0 lb

## 2020-11-15 DIAGNOSIS — M5441 Lumbago with sciatica, right side: Secondary | ICD-10-CM

## 2020-11-15 DIAGNOSIS — M5442 Lumbago with sciatica, left side: Secondary | ICD-10-CM | POA: Diagnosis not present

## 2020-11-15 DIAGNOSIS — I700 Atherosclerosis of aorta: Secondary | ICD-10-CM | POA: Diagnosis not present

## 2020-11-15 DIAGNOSIS — M545 Low back pain, unspecified: Secondary | ICD-10-CM | POA: Insufficient documentation

## 2020-11-15 DIAGNOSIS — E559 Vitamin D deficiency, unspecified: Secondary | ICD-10-CM

## 2020-11-15 DIAGNOSIS — I10 Essential (primary) hypertension: Secondary | ICD-10-CM | POA: Diagnosis not present

## 2020-11-15 DIAGNOSIS — G8929 Other chronic pain: Secondary | ICD-10-CM | POA: Diagnosis not present

## 2020-11-15 DIAGNOSIS — E7849 Other hyperlipidemia: Secondary | ICD-10-CM

## 2020-11-15 DIAGNOSIS — E119 Type 2 diabetes mellitus without complications: Secondary | ICD-10-CM

## 2020-11-15 DIAGNOSIS — E538 Deficiency of other specified B group vitamins: Secondary | ICD-10-CM

## 2020-11-15 MED ORDER — HYDROCHLOROTHIAZIDE 12.5 MG PO CAPS: 12.5000 mg | ORAL_CAPSULE | Freq: Every day | ORAL | 3 refills | Status: DC

## 2020-11-15 MED ORDER — GABAPENTIN 100 MG PO CAPS: ORAL_CAPSULE | ORAL | 5 refills | Status: DC

## 2020-11-15 NOTE — Patient Instructions (Signed)
Please take all new medication as prescribed - the gabapentin for pain  Please take all new medication as prescribed -- the HCT for the swelling and BP  Please continue all other medications as before, and refills have been done if requested.  Please have the pharmacy call with any other refills you may need.  Please continue your efforts at being more active, low cholesterol diet, and weight control.  You are otherwise up to date with prevention measures today.  Please keep your appointments with your specialists as you may have planned  You will be contacted regarding the referral for: MRI for the lower back  Please go to the LAB at the blood drawing area for the tests to be done  You will be contacted by phone if any changes need to be made immediately.  Otherwise, you will receive a letter about your results with an explanation, but please check with MyChart first.  Please remember to sign up for MyChart if you have not done so, as this will be important to you in the future with finding out test results, communicating by private email, and scheduling acute appointments online when needed.  Please make an Appointment to return in 6 months, or sooner if needed

## 2020-11-15 NOTE — Progress Notes (Signed)
Patient ID: Bridget Grant, female   DOB: 09/18/1945, 75 y.o.   MRN: [id number]         Chief Complaint:: yearly f/u       HPI:  LOUIS GAW is a 75 y.o. female here overall doing ok; Denies worsening reflux, abd pain, dysphagia, n/v, bowel change or blood. - GI symptoms have much improved and has now 1 yr f/u with GI and oncology.  Pt denies chest pain, increased sob or doe, wheezing, orthopnea, PND, palpitations, dizziness or syncope, but has had mild increased leg swelling and persistent elevated BP as well.  Swelling better in the AM, worse in the PM.  Pt continues to have recurring LBP without change in severity, bowel or bladder change, fever, but has worsening left LLE then RLE now both LEs for the past month, very uncomfortable now persistent constant 6/10 witout falls.  Has lost some wt she thinks related to the pain.   Pt denies polydipsia, polyuria, or new focal neuro s/s.   Pt denies fever, night sweats, loss of appetite, or other constitutional symptoms   Wt Readings from Last 3 Encounters:  11/15/20 183 lb (83 kg)  10/06/20 190 lb (86.2 kg)  09/04/20 190 lb 8 oz (86.4 kg)   BP Readings from Last 3 Encounters:  11/15/20 (!) 158/74  10/12/20 (!) 178/62  09/04/20 (!) 162/64   Immunization History  Administered Date(s) Administered   Fluad Quad(high Dose 65+) 03/04/2020   Influenza Split 03/04/2011, 03/03/2012   Influenza Whole 03/08/2009, 03/07/2010   Influenza, High Dose Seasonal PF 04/06/2013, 04/23/2017, 02/02/2019   Influenza,inj,Quad PF,6+ Mos 02/24/2014, 04/14/2015   Influenza-Unspecified 02/03/2018   PFIZER(Purple Top)SARS-COV-2 Vaccination 06/11/2019, 07/05/2019   Pneumococcal Conjugate-13 04/13/2014   Pneumococcal Polysaccharide-23 05/11/2019   Td 08/03/2008   Tdap 10/26/2018   Zoster Recombinat (Shingrix) 10/31/2017, 01/23/2018, 03/25/2018   Zoster, Live 08/03/2008   Health Maintenance Due  Topic Date Due   OPHTHALMOLOGY EXAM  Never done      Past Medical  History:  Diagnosis Date   Anxiety    Cancer ([place]) 2021   stomach   Complication of anesthesia    Depression    Hyperlipidemia    Hypertension    Migraine    Osteopenia    PONV (postoperative nausea and vomiting)    Type II or unspecified type diabetes mellitus without mention of complication, uncontrolled 04/09/2013   Upper GI bleed 01/13/2020   Past Surgical History:  Procedure Laterality Date   ABDOMINAL HYSTERECTOMY     APPENDECTOMY     BIOPSY  01/13/2020   Procedure: BIOPSY;  Surgeon: Milus Banister, MD;  Location: [facility] ENDOSCOPY;  Service: Endoscopy;;   BIOPSY  04/13/2020   Procedure: BIOPSY;  Surgeon: Milus Banister, MD;  Location: [facility] ENDOSCOPY;  Service: Endoscopy;;   CHOLECYSTECTOMY     ESOPHAGOGASTRODUODENOSCOPY (EGD) WITH PROPOFOL N/A 01/13/2020   Procedure: ESOPHAGOGASTRODUODENOSCOPY (EGD) WITH PROPOFOL;  Surgeon: Milus Banister, MD;  Location: [facility] ENDOSCOPY;  Service: Endoscopy;  Laterality: N/A;   ESOPHAGOGASTRODUODENOSCOPY (EGD) WITH PROPOFOL N/A 04/13/2020   Procedure: ESOPHAGOGASTRODUODENOSCOPY (EGD) WITH PROPOFOL;  Surgeon: Milus Banister, MD;  Location: [facility] ENDOSCOPY;  Service: Endoscopy;  Laterality: N/A;   ESOPHAGOGASTRODUODENOSCOPY (EGD) WITH PROPOFOL N/A 10/12/2020   Procedure: ESOPHAGOGASTRODUODENOSCOPY (EGD) WITH PROPOFOL;  Surgeon: Milus Banister, MD;  Location: [facility] ENDOSCOPY;  Service: Endoscopy;  Laterality: N/A;   EUS N/A 04/13/2020   Procedure: UPPER ENDOSCOPIC ULTRASOUND (EUS) LINEAR;  Surgeon: Milus Banister, MD;  Location:  [facility] ENDOSCOPY;  Service: Endoscopy;  Laterality: N/A;   EUS N/A 10/12/2020   Procedure: UPPER ENDOSCOPIC ULTRASOUND (EUS) RADIAL;  Surgeon: Milus Banister, MD;  Location: [facility] ENDOSCOPY;  Service: Endoscopy;  Laterality: N/A;   PARTIAL GASTRECTOMY  02/28/2020    reports that she has never smoked. She has never used smokeless tobacco. She reports that she does not drink alcohol and does not use drugs. family history includes Breast  cancer (age of onset: 27) in her mother; Cancer in her maternal grandmother; Cancer (age of onset: 94) in her mother; Hypertension in her father. No Known Allergies Current Outpatient Medications on File Prior to Visit  Medication Sig Dispense Refill   acetaminophen (TYLENOL) 500 MG tablet Take 1,000 mg by mouth every 6 (six) hours as needed.     atorvastatin (LIPITOR) 20 MG tablet TAKE 1 TABLET BY MOUTH EVERY DAY (Patient taking differently: Take 20 mg by mouth daily.) 90 tablet 3   diltiazem (CARDIZEM CD) 240 MG 24 hr capsule TAKE 1 CAPSULE(240 MG) BY MOUTH DAILY (Patient taking differently: Take 240 mg by mouth daily.) 90 capsule 3   imatinib (GLEEVEC) 400 MG tablet TAKE 1 TABLET BY MOUTH ONCE DAILY AT THE SAME TIME WITH FOOD AND A LARGE GLASS OF WATER. DO NOT CRUSH TABLETS. AVOID GRAPEFRUIT PRODUCTS. (Patient taking differently: Take 400 mg by mouth daily.) 30 tablet 4   lisinopril (ZESTRIL) 20 MG tablet Take 0.5 tablets (10 mg total) by mouth daily. 45 tablet 3   metFORMIN (GLUCOPHAGE-XR) 500 MG 24 hr tablet Take 2 tablets (1,000 mg total) by mouth daily with breakfast. 180 tablet 3   ondansetron (ZOFRAN ODT) 8 MG disintegrating tablet Take 1 tablet (8 mg total) by mouth every 8 (eight) hours as needed for nausea or vomiting. 30 tablet 2   OVER THE COUNTER MEDICATION Take 1 capsule by mouth daily. Super Beets otc supplement     pantoprazole (PROTONIX) 40 MG tablet Take 1 tablet (40 mg total) by mouth 2 (two) times daily before a meal. TAKE 1 TABLET(40 MG) BY MOUTH TWICE DAILY BEFORE A MEAL (Patient taking differently: Take 40 mg by mouth 2 (two) times daily before a meal.) 180 tablet 2   Prenatal Vit-Fe Fumarate-FA (PRENATAL PO) Take 3 tablets by mouth daily.     trolamine salicylate (ASPERCREME) 10 % cream Apply 1 application topically as needed for muscle pain.     [DISCONTINUED] diltiazem (TIAZAC) 240 MG 24 hr capsule Take 1 capsule (240 mg total) by mouth daily. 90 capsule 3   No current  facility-administered medications on file prior to visit.        ROS:  All others reviewed and negative.  Objective        PE:  BP (!) 158/74 (BP Location: Left Arm, Patient Position: Sitting, Cuff Size: Large)   Pulse 79   Temp 98.6 F (37 C) (Oral)   Ht 5\' 2"  (1.575 m)   Wt 183 lb (83 kg)   SpO2 98%   BMI 33.47 kg/m                 Constitutional: Pt appears in NAD               HENT: Head: NCAT.                Right Ear: External ear normal.                 Left Ear: External ear normal.  Eyes: . Pupils are equal, round, and reactive to light. Conjunctivae and EOM are normal               Nose: without d/c or deformity               Neck: Neck supple. Gross normal ROM               Cardiovascular: Normal rate and regular rhythm.                 Pulmonary/Chest: Effort normal and breath sounds without rales or wheezing.                Abd:  Soft, NT, ND, + BS, no organomegaly               Neurological: Pt is alert. At baseline orientation, motor grossly intact               Skin: Skin is warm. No rashes, no other new lesions, LE edema - trace bilateral distal LEs               Psychiatric: Pt behavior is normal without agitation   Micro: none  Cardiac tracings I have personally interpreted today:  none  Pertinent Radiological findings (summarize): none   Lab Results  Component Value Date   WBC 7.9 11/15/2020   HGB 12.6 11/15/2020   HCT 37.2 11/15/2020   PLT 256.0 11/15/2020   GLUCOSE 112 (H) 11/15/2020   CHOL 174 11/15/2020   TRIG 263.0 (H) 11/15/2020   HDL 49.50 11/15/2020   LDLDIRECT 100.0 11/15/2020   LDLCALC 62 11/16/2019   ALT 14 11/15/2020   AST 17 11/15/2020   NA 140 11/15/2020   K 4.0 11/15/2020   CL 101 11/15/2020   CREATININE 0.81 11/15/2020   BUN 17 11/15/2020   CO2 31 11/15/2020   TSH 1.50 11/15/2020   INR 0.9 01/11/2020   HGBA1C 6.4 11/15/2020   MICROALBUR 1.1 11/16/2019   Assessment/Plan:  JAHNIA HEWES is a 75 y.o. White  or Caucasian [1] female with  has a past medical history of Anxiety, Cancer ([place]) (9798), Complication of anesthesia, Depression, Hyperlipidemia, Hypertension, Migraine, Osteopenia, PONV (postoperative nausea and vomiting), Type II or unspecified type diabetes mellitus without mention of complication, uncontrolled (04/09/2013), and Upper GI bleed (01/13/2020).  Aortic atherosclerosis ([place]) To continue low chol diet, lipitor   Hyperlipidemia Lab Results  Component Value Date   LDLCALC 62 11/16/2019   Stable, pt to continue current statin lipitor 20   Essential hypertension BP Readings from Last 3 Encounters:  11/15/20 (!) 158/74  10/12/20 (!) 178/62  09/04/20 (!) 162/64   Stable, pt to continue medical treatment  Cardizem, lisinopril and add hct 12.5 mg with the edema     2.    Diabetes ([place]) Lab Results  Component Value Date   HGBA1C 6.4 11/15/2020   Stable, pt to continue current medical treatment metformin   Vitamin D deficiency Last vitamin D Lab Results  Component Value Date   VD25OH 18.03 (L) 11/15/2020   Low to start oral replacement  Vitamin B12 deficiency Lab Results  Component Value Date   VITAMINB12 403 11/15/2020   Stable, cont oral replacement - b12 1000 mcg qd   Low back pain symptomaticaly worsening, for add gabapentin 100 tid, MRI LS spine,  to f/u any worsening symptoms or concerns  Followup: Return in about 6 months (around 05/17/2021).  Cathlean Cower, MD 11/20/2020 8:35 PM [place]  [facility]

## 2020-11-16 ENCOUNTER — Encounter: Payer: Self-pay | Admitting: Internal Medicine

## 2020-11-16 LAB — CBC WITH DIFFERENTIAL/PLATELET
Basophils Absolute: 0.1 10*3/uL (ref 0.0–0.1)
Basophils Relative: 1 % (ref 0.0–3.0)
Eosinophils Absolute: 0.2 10*3/uL (ref 0.0–0.7)
Eosinophils Relative: 2.5 % (ref 0.0–5.0)
HCT: 37.2 % (ref 36.0–46.0)
Hemoglobin: 12.6 g/dL (ref 12.0–15.0)
Lymphocytes Relative: 25.9 % (ref 12.0–46.0)
Lymphs Abs: 2 10*3/uL (ref 0.7–4.0)
MCHC: 33.9 g/dL (ref 30.0–36.0)
MCV: 103.9 fl — ABNORMAL HIGH (ref 78.0–100.0)
Monocytes Absolute: 0.8 10*3/uL (ref 0.1–1.0)
Monocytes Relative: 10.6 % (ref 3.0–12.0)
Neutro Abs: 4.8 10*3/uL (ref 1.4–7.7)
Neutrophils Relative %: 60 % (ref 43.0–77.0)
Platelets: 256 10*3/uL (ref 150.0–400.0)
RBC: 3.58 Mil/uL — ABNORMAL LOW (ref 3.87–5.11)
RDW: 13.3 % (ref 11.5–15.5)
WBC: 7.9 10*3/uL (ref 4.0–10.5)

## 2020-11-16 LAB — LIPID PANEL
Cholesterol: 174 mg/dL (ref 0–200)
HDL: 49.5 mg/dL (ref >39.00)
NonHDL: 124.43
Total CHOL/HDL Ratio: 4
Triglycerides: 263 mg/dL — ABNORMAL HIGH (ref 0.0–149.0)
VLDL: 52.6 mg/dL — ABNORMAL HIGH (ref 0.0–40.0)

## 2020-11-16 LAB — BASIC METABOLIC PANEL
BUN: 17 mg/dL (ref 6–23)
CO2: 31 mEq/L (ref 19–32)
Calcium: 9.7 mg/dL (ref 8.4–10.5)
Chloride: 101 mEq/L (ref 96–112)
Creatinine, Ser: 0.81 mg/dL (ref 0.40–1.20)
GFR: 71.97 mL/min (ref >60.00)
Glucose, Bld: 112 mg/dL — ABNORMAL HIGH (ref 70–99)
Potassium: 4 mEq/L (ref 3.5–5.1)
Sodium: 140 mEq/L (ref 135–145)

## 2020-11-16 LAB — VITAMIN B12: Vitamin B-12: 403 pg/mL (ref 211–911)

## 2020-11-16 LAB — HEPATIC FUNCTION PANEL
ALT: 14 U/L (ref 0–35)
AST: 17 U/L (ref 0–37)
Albumin: 4.5 g/dL (ref 3.5–5.2)
Alkaline Phosphatase: 59 U/L (ref 39–117)
Bilirubin, Direct: 0.1 mg/dL (ref 0.0–0.3)
Total Bilirubin: 0.4 mg/dL (ref 0.2–1.2)
Total Protein: 7.5 g/dL (ref 6.0–8.3)

## 2020-11-16 LAB — VITAMIN D 25 HYDROXY (VIT D DEFICIENCY, FRACTURES): VITD: 18.03 ng/mL — ABNORMAL LOW (ref 30.00–100.00)

## 2020-11-16 LAB — HEMOGLOBIN A1C: Hgb A1c MFr Bld: 6.4 % (ref 4.6–6.5)

## 2020-11-16 LAB — LDL CHOLESTEROL, DIRECT: Direct LDL: 100 mg/dL

## 2020-11-16 LAB — TSH: TSH: 1.5 u[IU]/mL (ref 0.35–4.50)

## 2020-11-20 ENCOUNTER — Encounter: Payer: Self-pay | Admitting: Internal Medicine

## 2020-11-20 NOTE — Assessment & Plan Note (Signed)
Lab Results  Component Value Date   HGBA1C 6.4 11/15/2020   Stable, pt to continue current medical treatment metformin

## 2020-11-20 NOTE — Assessment & Plan Note (Signed)
Lab Results  Component Value Date   VITAMINB12 403 11/15/2020   Stable, cont oral replacement - b12 1000 mcg qd

## 2020-11-20 NOTE — Assessment & Plan Note (Signed)
symptomaticaly worsening, for add gabapentin 100 tid, MRI LS spine,  to f/u any worsening symptoms or concerns

## 2020-11-20 NOTE — Assessment & Plan Note (Signed)
Last vitamin D Lab Results  Component Value Date   VD25OH 18.03 (L) 11/15/2020   Low to start oral replacement

## 2020-11-20 NOTE — Assessment & Plan Note (Signed)
To continue low chol diet, lipitor

## 2020-11-20 NOTE — Assessment & Plan Note (Signed)
Lab Results  Component Value Date   LDLCALC 62 11/16/2019   Stable, pt to continue current statin lipitor 20

## 2020-11-20 NOTE — Assessment & Plan Note (Signed)
BP Readings from Last 3 Encounters:  11/15/20 (!) 158/74  10/12/20 (!) 178/62  09/04/20 (!) 162/64   Stable, pt to continue medical treatment  Cardizem, lisinopril and add hct 12.5 mg with the edema     2.

## 2020-11-27 ENCOUNTER — Ambulatory Visit
Admission: RE | Admit: 2020-11-27 | Discharge: 2020-11-27 | Disposition: A | Discharge status: Home / Self Care | Payer: Medicare Other | Source: Ambulatory Visit | Attending: Internal Medicine | Admitting: Internal Medicine

## 2020-11-27 ENCOUNTER — Other Ambulatory Visit: Payer: Self-pay

## 2020-11-27 DIAGNOSIS — G8929 Other chronic pain: Secondary | ICD-10-CM

## 2020-11-27 DIAGNOSIS — M5442 Lumbago with sciatica, left side: Secondary | ICD-10-CM

## 2020-11-27 DIAGNOSIS — M48061 Spinal stenosis, lumbar region without neurogenic claudication: Secondary | ICD-10-CM | POA: Diagnosis not present

## 2020-11-27 DIAGNOSIS — M545 Low back pain, unspecified: Secondary | ICD-10-CM | POA: Diagnosis not present

## 2020-11-27 IMAGING — MR MR LUMBAR SPINE W/O CM
4 of 5 series · 27 of 48 positions shown · non-contrast
Comparison: None.

CLINICAL DATA: Low back pain and left leg pain for 1-2 months. No
specific injury.

EXAM:
MRI LUMBAR SPINE WITHOUT CONTRAST
TECHNIQUE: Multiplanar, multisequence MR imaging of the lumbar spine was
performed. No intravenous contrast was administered.

[Series 2: T2 · sagittal · 4.0mm · 1.09mm/px · 6 of 16 slices shown (1 of 2)]
[im 1/16]
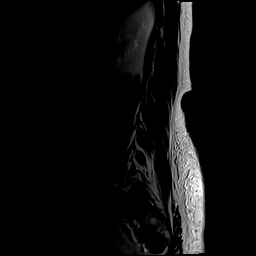
[im 4/16]
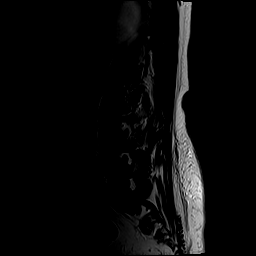
[im 7/16]
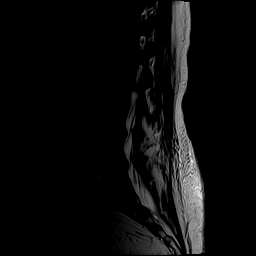
[im 10/16]
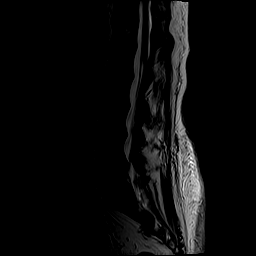
[im 13/16]
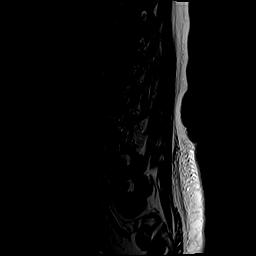
[im 16/16]
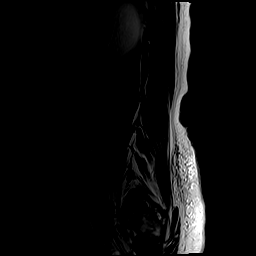

[Series 4: T1 · sagittal · 4.0mm · 1.09mm/px · 6 of 16 slices shown (1 of 2)]
[im 1/16]
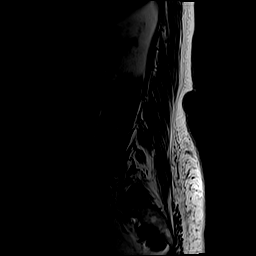
[im 4/16]
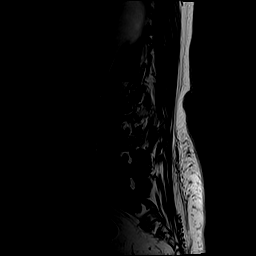
[im 7/16]
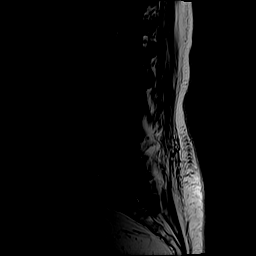
[im 10/16]
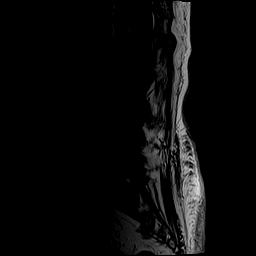
[im 13/16]
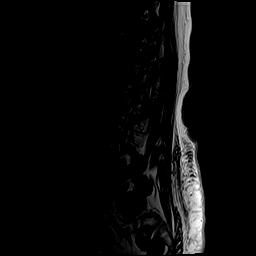
[im 16/16]
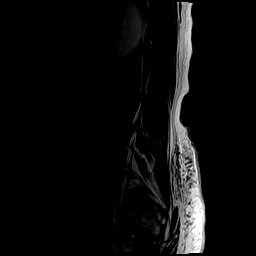

[Series 5: T2 · axial · 4.0mm · 0.39mm/px · z∈[-71,+148]mm · 9 of 42 slices shown (2 of 2)]
[im 1/42]
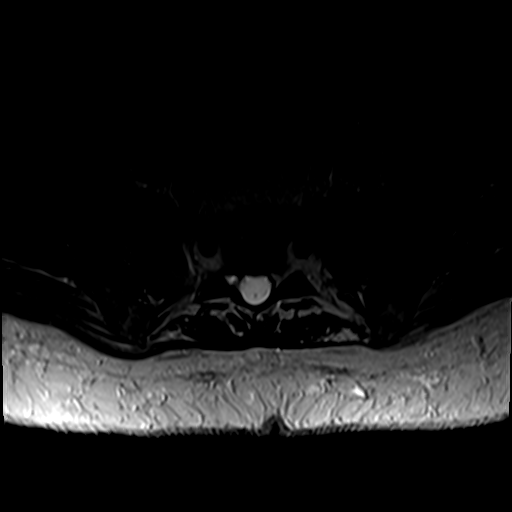
[im 6/42]
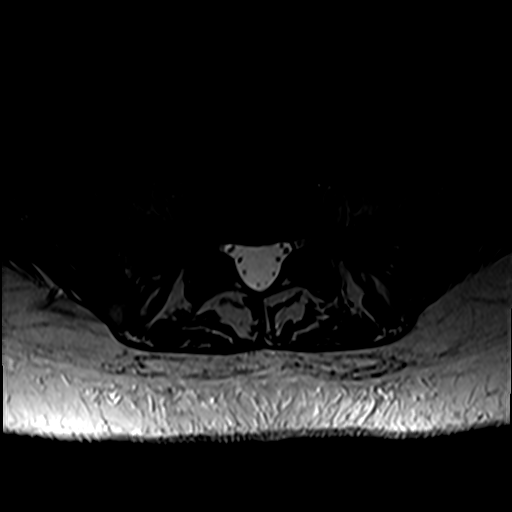
[im 12/42]
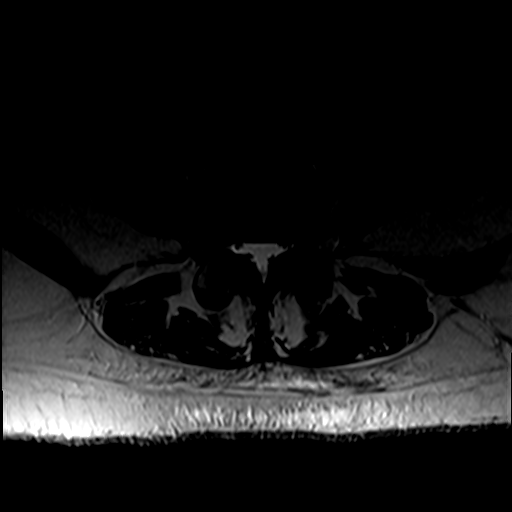
[im 18/42]
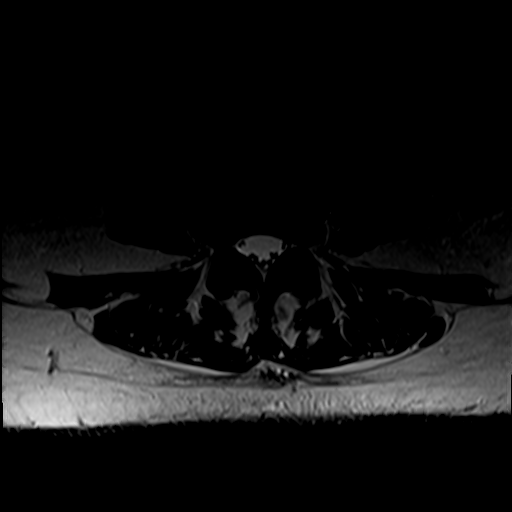
[im 21/42]
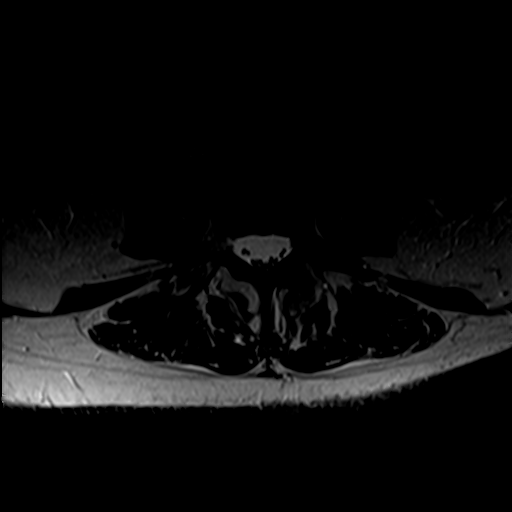
[im 24/42]
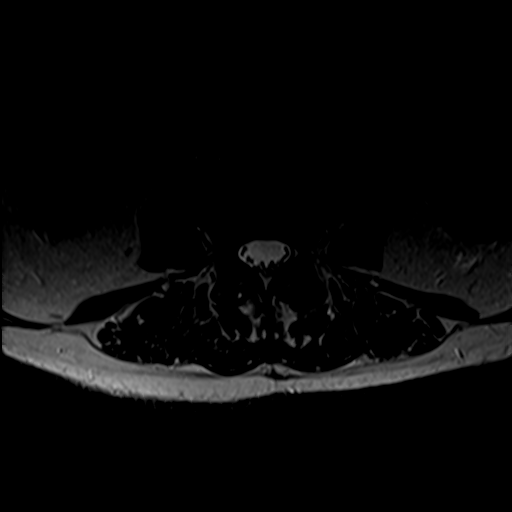
[im 30/42]
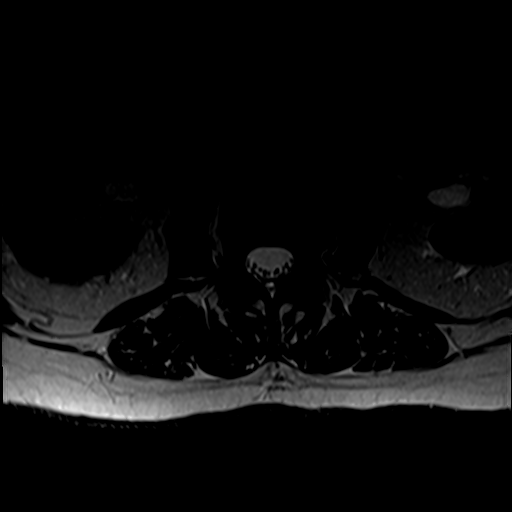
[im 36/42]
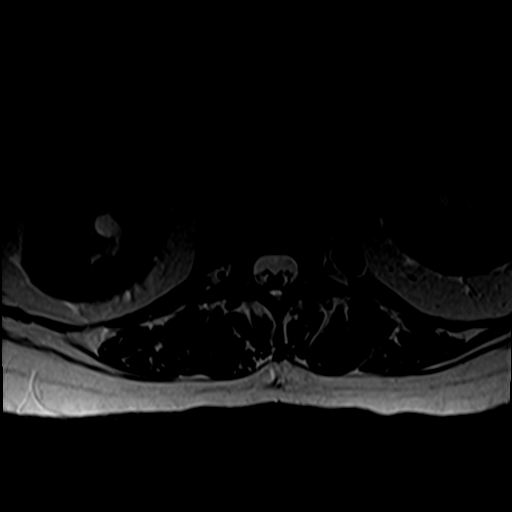
[im 42/42]
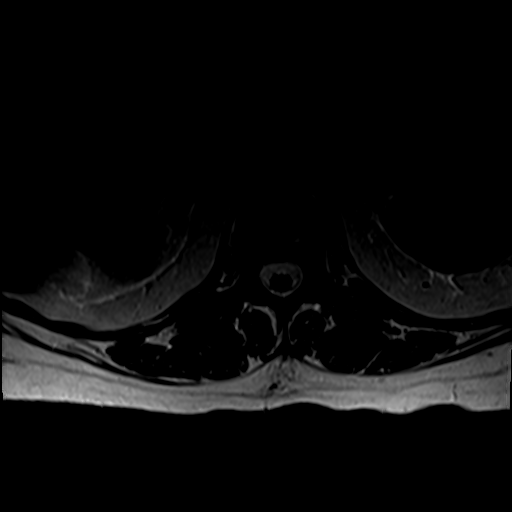

[Series 6: T1 · axial · 4.0mm · 0.39mm/px · z∈[-71,+120]mm · 6 of 42 slices shown (2 of 2)]
[im 1/42]
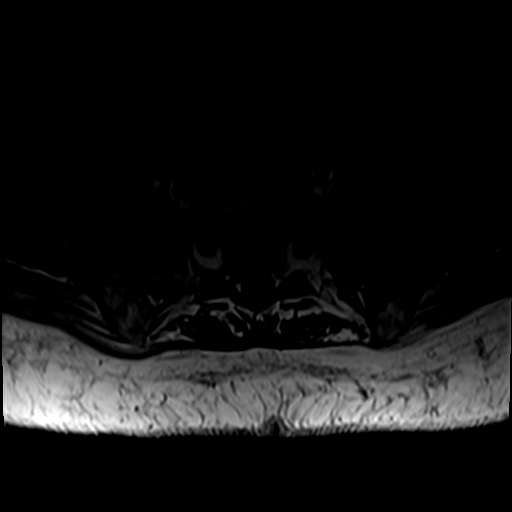
[im 6/42]
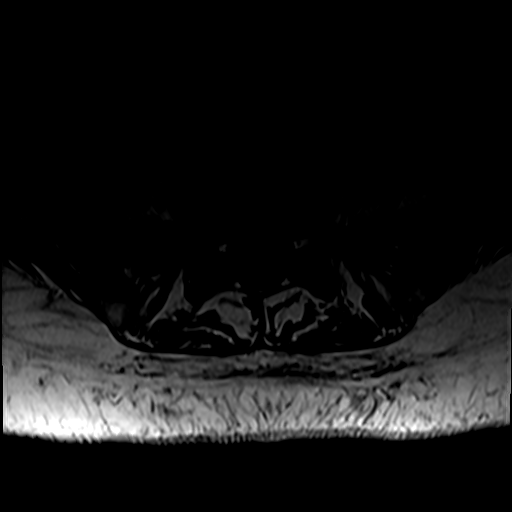
[im 12/42]
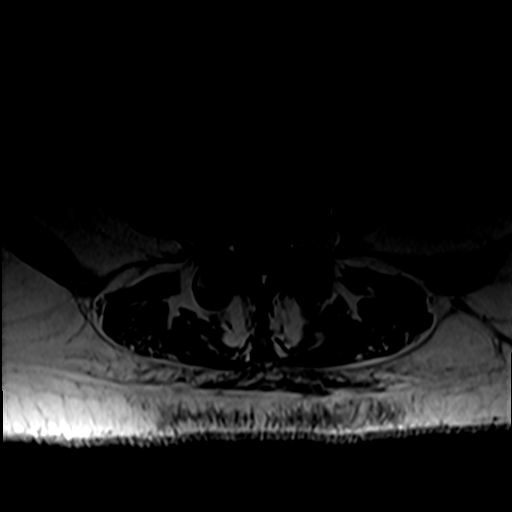
[im 18/42]
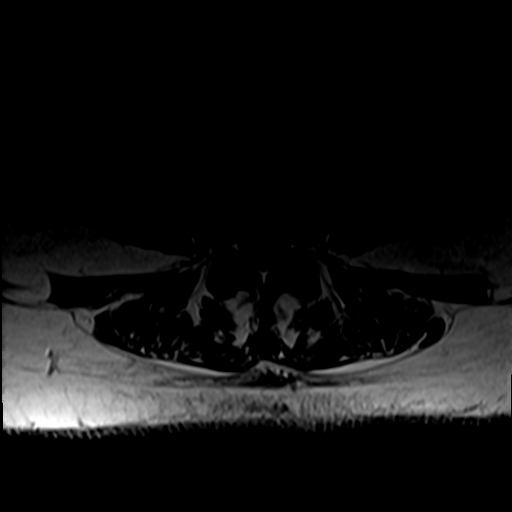
[im 21/42]
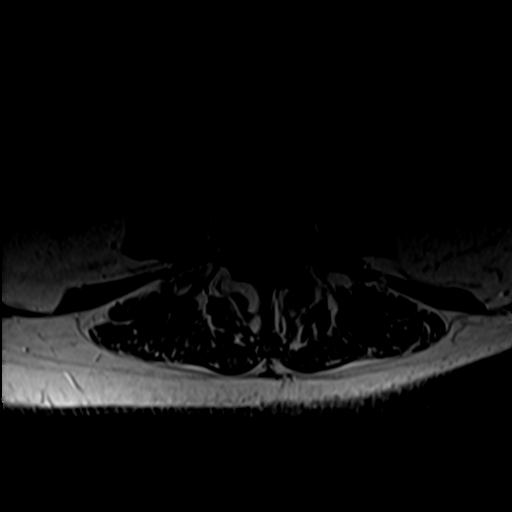
[im 36/42]
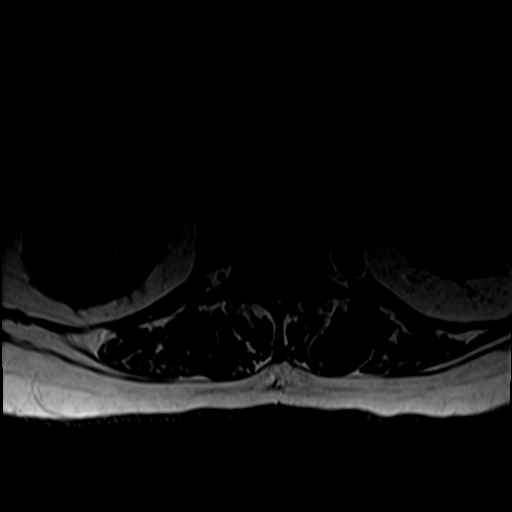

[27 of 48 positions shown; findings below may reference images not displayed]

FINDINGS: Segmentation: There are five lumbar type vertebral bodies. The last
full intervertebral disc space is labeled L5-S1.

Alignment: Normal overall alignment. Very slight degenerative
anterolisthesis of L4.

Vertebrae: Normal marrow signal. No bone lesions or fractures. Small
scattered hemangiomas are noted.

Conus medullaris and cauda equina: Conus extends to the L1-2 level.
Conus and cauda equina appear normal.

Paraspinal and other soft tissues: No significant paraspinal or
retroperitoneal findings. Small renal cysts are noted.

Disc levels:

T12-L1: No significant findings.

L1-2: Mild central annular bulge but no disc protrusions, spinal or
foraminal stenosis.

L2-3: Mild annular bulge. No disc protrusions, spinal or foraminal
stenosis. Mild facet disease.

L3-4: Mild annular bulge and mild facet disease with mild lateral
recess encroachment bilaterally. There is also a shallow right
foraminal disc protrusion potentially irritating the right L3 nerve
root.

L4-5: Diffuse bulging and slightly uncovered disc. This in
combination with advanced facet disease and ligamentum flavum
thickening contributes to mild spinal and moderate bilateral lateral
recess stenosis. There is also an annular fissure and shallow right
foraminal/extraforaminal disc protrusion. No direct neural
compression but potential irritation of the right L4 nerve root.

L5-S1: Facet disease but no disc protrusions, spinal or foraminal
stenosis.
IMPRESSION: 1. Shallow right foraminal disc protrusion at L3-4 potentially
irritating the right L3 nerve root.
2. Mild spinal and moderate bilateral lateral recess stenosis at
L4-5. There is also an annular fissure and shallow right
foraminal/extraforaminal disc protrusion potentially irritating the
right L4 nerve root.

## 2020-11-29 ENCOUNTER — Other Ambulatory Visit: Payer: Self-pay | Admitting: Internal Medicine

## 2020-11-29 ENCOUNTER — Encounter: Payer: Self-pay | Admitting: Internal Medicine

## 2020-11-29 DIAGNOSIS — M5442 Lumbago with sciatica, left side: Secondary | ICD-10-CM

## 2020-11-29 DIAGNOSIS — G8929 Other chronic pain: Secondary | ICD-10-CM

## 2020-11-29 DIAGNOSIS — M5416 Radiculopathy, lumbar region: Secondary | ICD-10-CM

## 2020-12-05 ENCOUNTER — Other Ambulatory Visit: Payer: Self-pay

## 2020-12-05 ENCOUNTER — Inpatient Hospital Stay: Payer: Medicare Other | Attending: Hematology

## 2020-12-05 DIAGNOSIS — E538 Deficiency of other specified B group vitamins: Secondary | ICD-10-CM | POA: Insufficient documentation

## 2020-12-05 DIAGNOSIS — D500 Iron deficiency anemia secondary to blood loss (chronic): Secondary | ICD-10-CM

## 2020-12-05 MED ORDER — CYANOCOBALAMIN 1000 MCG/ML IJ SOLN
1000.0000 ug | Freq: Once | INTRAMUSCULAR | Status: AC
  Administered 2020-12-05: 1000 ug via INTRAMUSCULAR

## 2020-12-05 MED ORDER — CYANOCOBALAMIN 1000 MCG/ML IJ SOLN
INTRAMUSCULAR | Status: AC
  Filled 2020-12-05: qty 1

## 2020-12-08 ENCOUNTER — Inpatient Hospital Stay: Payer: Medicare Other | Attending: Hematology | Admitting: Hematology

## 2020-12-08 ENCOUNTER — Inpatient Hospital Stay: Payer: Medicare Other

## 2020-12-21 DIAGNOSIS — M4316 Spondylolisthesis, lumbar region: Secondary | ICD-10-CM | POA: Diagnosis not present

## 2021-01-11 ENCOUNTER — Other Ambulatory Visit: Payer: Self-pay | Admitting: Internal Medicine

## 2021-01-11 NOTE — Telephone Encounter (Signed)
Please refill as per office routine med refill policy (all routine meds refilled for 3 mo or monthly per pt preference up to one year from last visit, then month to month grace period for 3 mo, then further med refills will have to be denied)  

## 2021-01-18 DIAGNOSIS — M47816 Spondylosis without myelopathy or radiculopathy, lumbar region: Secondary | ICD-10-CM | POA: Diagnosis not present

## 2021-02-19 ENCOUNTER — Other Ambulatory Visit: Payer: Self-pay

## 2021-02-19 ENCOUNTER — Ambulatory Visit (INDEPENDENT_AMBULATORY_CARE_PROVIDER_SITE_OTHER): Payer: Medicare Other

## 2021-02-19 DIAGNOSIS — Z23 Encounter for immunization: Secondary | ICD-10-CM

## 2021-02-19 NOTE — Progress Notes (Signed)
Medical screening examination/treatment/procedure(s) were performed by non-physician practitioner and as supervising physician I was immediately available for consultation/collaboration. I agree with above. Clarice Zulauf, MD   

## 2021-02-21 ENCOUNTER — Telehealth: Payer: Self-pay | Admitting: Internal Medicine

## 2021-02-21 NOTE — Telephone Encounter (Signed)
Left message for patient to call me back at [phone] to schedule Medicare Annual Wellness Visit   Last AWV  11/16/19  Please schedule at anytime with [facility] if patient calls the office back.    40 Minutes appointment   Any questions, please call me at [phone]

## 2021-02-22 ENCOUNTER — Telehealth: Payer: Self-pay | Admitting: Hematology

## 2021-02-22 ENCOUNTER — Other Ambulatory Visit: Payer: Self-pay | Admitting: Hematology

## 2021-02-22 ENCOUNTER — Ambulatory Visit: Payer: Medicare Other

## 2021-02-22 DIAGNOSIS — C49A2 Gastrointestinal stromal tumor of stomach: Secondary | ICD-10-CM

## 2021-02-22 NOTE — Telephone Encounter (Signed)
Scheduled per sch msg. Called and left msg. Mailed printout  

## 2021-02-28 ENCOUNTER — Ambulatory Visit: Payer: Medicare Other | Admitting: Hematology

## 2021-02-28 ENCOUNTER — Ambulatory Visit: Payer: Medicare Other

## 2021-02-28 ENCOUNTER — Other Ambulatory Visit: Payer: Medicare Other

## 2021-03-08 ENCOUNTER — Telehealth: Payer: Self-pay | Admitting: Hematology

## 2021-03-08 NOTE — Telephone Encounter (Signed)
R/s appt per patient request. Patient not at home, spoke with brother. Patient will call back once she gets back home

## 2021-03-09 ENCOUNTER — Ambulatory Visit: Payer: Medicare Other | Admitting: Hematology

## 2021-03-09 ENCOUNTER — Ambulatory Visit: Payer: Medicare Other

## 2021-03-09 ENCOUNTER — Other Ambulatory Visit: Payer: Medicare Other

## 2021-03-19 ENCOUNTER — Inpatient Hospital Stay: Payer: Medicare Other | Attending: Hematology

## 2021-03-19 ENCOUNTER — Inpatient Hospital Stay (HOSPITAL_BASED_OUTPATIENT_CLINIC_OR_DEPARTMENT_OTHER): Payer: Medicare Other | Admitting: Hematology

## 2021-03-19 ENCOUNTER — Inpatient Hospital Stay: Payer: Medicare Other

## 2021-03-19 ENCOUNTER — Other Ambulatory Visit: Payer: Self-pay

## 2021-03-19 ENCOUNTER — Encounter: Payer: Self-pay | Admitting: Hematology

## 2021-03-19 VITALS — BP 160/70 | HR 81 | Temp 98.2°F | Resp 18 | Ht 62.0 in | Wt 181.0 lb

## 2021-03-19 DIAGNOSIS — C49A0 Gastrointestinal stromal tumor, unspecified site: Secondary | ICD-10-CM

## 2021-03-19 DIAGNOSIS — E538 Deficiency of other specified B group vitamins: Secondary | ICD-10-CM | POA: Diagnosis not present

## 2021-03-19 DIAGNOSIS — C49A2 Gastrointestinal stromal tumor of stomach: Secondary | ICD-10-CM | POA: Diagnosis not present

## 2021-03-19 DIAGNOSIS — D500 Iron deficiency anemia secondary to blood loss (chronic): Secondary | ICD-10-CM

## 2021-03-19 LAB — CMP (CANCER CENTER ONLY)
ALT: 19 U/L (ref 0–44)
AST: 22 U/L (ref 15–41)
Albumin: 4.3 g/dL (ref 3.5–5.0)
Alkaline Phosphatase: 72 U/L (ref 38–126)
Anion gap: 12 (ref 5–15)
BUN: 12 mg/dL (ref 8–23)
CO2: 25 mmol/L (ref 22–32)
Calcium: 9.4 mg/dL (ref 8.9–10.3)
Chloride: 105 mmol/L (ref 98–111)
Creatinine: 0.93 mg/dL (ref 0.44–1.00)
GFR, Estimated: >60 mL/min (ref >60)
Glucose, Bld: 109 mg/dL — ABNORMAL HIGH (ref 70–99)
Potassium: 3.6 mmol/L (ref 3.5–5.1)
Sodium: 142 mmol/L (ref 135–145)
Total Bilirubin: 0.5 mg/dL (ref 0.3–1.2)
Total Protein: 7.4 g/dL (ref 6.5–8.1)

## 2021-03-19 LAB — IRON AND TIBC
Iron: 124 ug/dL (ref 41–142)
Saturation Ratios: 51 % (ref 21–57)
TIBC: 245 ug/dL (ref 236–444)
UIBC: 121 ug/dL (ref 120–384)

## 2021-03-19 LAB — CBC WITH DIFFERENTIAL (CANCER CENTER ONLY)
Abs Immature Granulocytes: 0.01 10*3/uL (ref 0.00–0.07)
Basophils Absolute: 0 10*3/uL (ref 0.0–0.1)
Basophils Relative: 1 %
Eosinophils Absolute: 0.2 10*3/uL (ref 0.0–0.5)
Eosinophils Relative: 2 %
HCT: 32.8 % — ABNORMAL LOW (ref 36.0–46.0)
Hemoglobin: 11.5 g/dL — ABNORMAL LOW (ref 12.0–15.0)
Immature Granulocytes: 0 %
Lymphocytes Relative: 20 %
Lymphs Abs: 1.4 10*3/uL (ref 0.7–4.0)
MCH: 35.5 pg — ABNORMAL HIGH (ref 26.0–34.0)
MCHC: 35.1 g/dL (ref 30.0–36.0)
MCV: 101.2 fL — ABNORMAL HIGH (ref 80.0–100.0)
Monocytes Absolute: 0.8 10*3/uL (ref 0.1–1.0)
Monocytes Relative: 12 %
Neutro Abs: 4.5 10*3/uL (ref 1.7–7.7)
Neutrophils Relative %: 65 %
Platelet Count: 226 10*3/uL (ref 150–400)
RBC: 3.24 MIL/uL — ABNORMAL LOW (ref 3.87–5.11)
RDW: 12.9 % (ref 11.5–15.5)
WBC Count: 7 10*3/uL (ref 4.0–10.5)
nRBC: 0 % (ref 0.0–0.2)

## 2021-03-19 LAB — RETIC PANEL
Immature Retic Fract: 9.7 % (ref 2.3–15.9)
RBC.: 3.32 MIL/uL — ABNORMAL LOW (ref 3.87–5.11)
Retic Count, Absolute: 52.5 10*3/uL (ref 19.0–186.0)
Retic Ct Pct: 1.6 % (ref 0.4–3.1)
Reticulocyte Hemoglobin: 38 pg (ref >27.9)

## 2021-03-19 LAB — VITAMIN B12: Vitamin B-12: 237 pg/mL (ref 180–914)

## 2021-03-19 LAB — FERRITIN: Ferritin: 400 ng/mL — ABNORMAL HIGH (ref 11–307)

## 2021-03-19 MED ORDER — IMATINIB MESYLATE 400 MG PO TABS: 400.0000 mg | ORAL_TABLET | Freq: Every day | ORAL | 2 refills | Status: DC

## 2021-03-19 MED ORDER — ONDANSETRON 8 MG PO TBDP: 8.0000 mg | ORAL_TABLET | Freq: Three times a day (TID) | ORAL | 1 refills | Status: AC | PRN

## 2021-03-19 NOTE — Progress Notes (Signed)
Placed standing orders for labs d/t vitamin B12 deficiency Gastric bleed r/t GIST.  CBC w/DIFF CMP FERRITIN (last drawn on 08/31/2020) IRON & TIBC (last drawn on 08/31/2020) RETIC PANEL (last drawn on 08/31/2020)

## 2021-03-19 NOTE — Progress Notes (Signed)
[place]   [phone] [phone]   Clinic Follow up Note   Patient Care Team: [name], MD as PCP - Charissa Bash, MD as Consulting Physician (Oncology) Jonnie Finner, RN (Inactive) as Oncology Nurse Navigator Stark Klein, MD as Consulting Physician (General Surgery) Milus Banister, MD as Attending Physician (Gastroenterology)  Date of Service:  03/19/2021  CHIEF COMPLAINT: f/u of GIST  CURRENT THERAPY:  Gleevec 416m once daily starting 01/27/20 B12 injection monthly starting 04/14/20. Lost f/u after 11/2020, restart every 6 weeks in 03/2021  ASSESSMENT & PLAN:  Bridget DAUENHAUERis a 75y.o. female with   1. Gastric GIST, ypT3NxM0,  High grade, Kit mutation (+)  -She was diagnosed in 01/2020 with High grade gastrointestinal stromal tumor.  -Her staging CT CAP from 01/13/20 did not show gastric mass (I think the greater curvature of gastric wall is thickened)  but did show incidental findings of left breast mass and 6.528msclerotic lesion at T7.  -Given her bleeding tumor, large size and other work up, I started her on Gleevec 4005mn 01/27/20, prior to surgery.  -She underwent surgery on 02/29/20 with Dr ByeBarry Dienesurgical path shows high-grade GIST spanning 8 cm infiltrating through most of the distal gastric wall with a positive distal resection margin, staged pT3. Pathology also shows areas of rhabdomyosarcomatous differentiation but is not felt to represent a mixed rhabdomyosarcoma/GIST tumor. Unfortunately, given these features she is at very high risk for recurrence and metastasis.  -She underwent EUS on 04/13/20 which showed no evidence of malignancy or GIST. Although biopsy does not show GIST, Dr JacArdis Hughss concern for residual disease.  -most recent CT AP from 08/31/20 showed no evidence of recurrence. -repeat EUS on 10/12/20 showed significant improvement. -she is on adjuvant Imatinib, will continue, she is tolerating well  -we  will plan for restaging scan next year. -lab and f/u in 3 months   2. Anemia secondary to GI bleeding from #1, B12 deficiency  -She was hospitalized on 01/12/20 due to severe anemia with Hg 7.9.  -She was treated with blood transfusion on 01/17/20 and 03/06/20 and IV Feraheme on 01/15/20 and 02/09/20 -she was previously on monthly B12 injections, last 12/05/20.  lost f/u after 11/2020 -B12 level 237 today, will restart at every 6 weeks    3. T7 Spinal Lesion  -Her 01/13/20 CT scan also shows a 6.5 mm sclerotic focus within the posterior aspect of the T7 vertebral body.  -Prior PET scan was negative for bone mets -she is asymptomatic  -no bone lesion on CT 08/31/20     PLAN:  -Continue Gleevec 400m25mce daily, refilled today -lab and f/u in 3 months  -restart B12 injection and continue every 6 weeks    No problem-specific Assessment & Plan notes found for this encounter.   SUMMARY OF ONCOLOGIC HISTORY: Oncology History Overview Note  Cancer Staging Malignant gastrointestinal stromal tumor (GIST) of stomach (HCC) Staging form: Gastrointestinal Stromal Tumor - Gastric and Omental GIST, AJCC 8th Edition - Pathologic stage from 02/29/2020: pT3, Mitotic Rate: High - Unsigned Histologic grade (G): High grade Histologic grading system: 2 grade system Location of the gastrointestinal stromal tumor: Stomach    Malignant gastrointestinal stromal tumor (GIST) of stomach (HCC)[place]/10/2019 Procedure   EGD/Upper Endoscopy by Dr JacoArdis Hughs/21  IMPRESSION - There was an ulcerated mass along the greater curvature of the stomach, proximal edge was about 5-6cm from the GE junction and the mass  was 7-8cm long, 2-3cm wide. It was not circumferental. The mass was friable with spontaneous oozing, contact oozing, ulcerated and heaped up. Some parts of the mass were quite soft whereas other areas were more firm. This was very suspicious for a malignancy and it was biopsied extensively. There was mild distal  gastritis, biopsied to check for H. pylori. - The examination was otherwise normal.   01/13/2020 Initial Biopsy   FINAL MICROSCOPIC DIAGNOSIS:  A. STOMACH, BIOPSY:  - High grade gastrointestinal stromal tumor, see comment.   B. STOMACH, DISTAL, BIOPSY:  - Mild reactive gastropathy with erosions.  - Warthin-Starry is negative for Helicobacter pylori.  - No intestinal metaplasia, dysplasia, or malignancy.  COMMENT:  A. The tumor has spindle and epithelioid components and appears high  grade (7 mitosis/20 hpf). Immunohistochemistry is positive for CD117 and  CD34. The cells are negative for cytokeratin 20, cytokeratin 7,  cytokeratin 5/6, CDX-2, SMA, and desmin. The findings are consistent  with a gastrointestinal stromal tumor. Dr. Vic Ripper has reviewed the  case. Dr. Ardis Hughs' nurse was notified on 01/18/2020.     01/13/2020 Imaging   CT CAP W contrast 01/13/20  IMPRESSION: 1. 2.1 cm x 1.1 cm well-defined area of low attenuation within the soft tissues of the left breast. This may represent a lymph node, however, correlation with mammography is recommended. 2. 6.5 mm sclerotic focus within the posterior aspect of the T7 vertebral body. This may represent a bone island, however, correlation with follow-up abdomen pelvis CT is recommended to exclude the presence of a metastatic lesion. 3. Small hiatal hernia. 4. 2.6 cm x 2.0 cm fat containing umbilical hernia. 5. Aortic atherosclerosis.    01/23/2020 Initial Diagnosis   Malignant gastrointestinal stromal tumor (GIST) of stomach ([place])   01/26/2020 -  Chemotherapy   Gleevec 425m once daily starting 01/26/20    02/11/2020 PET scan   IMPRESSION: 1. Focal mass along the stomach fundus with maximum SUV 15.7, compatible with malignancy. No findings of metastatic spread. 2. Mildly increased sub solid nodularity in the left upper lobe with only very subtle associated activity, likely inflammatory. Incidental note is made of some secondary  pulmonary lobular septal thickening in the lung apices, and mild interstitial edema is not excluded given the underlying cardiomegaly. 3. Other imaging findings of potential clinical significance: Aortic Atherosclerosis (ICD10-I70.0). Coronary atherosclerosis. Sigmoid colon diverticulosis. Small umbilical hernia contains adipose tissue. Physiologic activity along the costovertebral joints.   02/29/2020 Surgery   Partial gastrectomy by Dr. BBarry Dienes   02/29/2020 Pathology Results   FINAL MICROSCOPIC DIAGNOSIS:  A. STOMACH, PARTIAL GASTRECTOMY:  - High grade gastrointestinal stromal tumor with dedifferentiation,  spanning approximately 8 cm  - Tumor infiltrates through most of the distal gastric wall and involves  the distal resection margin  - See oncology table  - See comment  Immunohistochemical stains show that the tumor cells are positive for  CD117, CD34 and SMA (patchy).  A component of the tumor shows very  high-grade features with loss of CD117 and CD34 expression, consistent  with dedifferentiation.  Tumor also shows areas with  rhabdomyosarcomatous differentiation with presence of desmin expression.    04/13/2020 Procedure   EUS  by Dr JArdis Hughs IMPRESSION - The staple line from recent partial gastrectomy was noted in the proximal stomach, runs longitudinally along the gastric fundus. The mucosa immediately around the stable line was normal appearing however in the more distal gastric body (anterior wall likely) the mucosa was quite irregular appearing; friable, firm,  slightly ulcerated for 4-5cm at least. This was not a discrete tumor, and these changes were about 5cm from the pylorus. I sampled this region extensively with forceps following EUS evaluation below.- The muscularis propria was abnormally thickened starting around the staple line and continuing throughout most of the stomach distally. This thickening was not circumferential.     FINAL MICROSCOPIC DIAGNOSIS:    A. GASTRIC BODY, BIOPSY:  - Gastric mucosa with changes consistent with reactive gastropathy.  - No spindle cell lesion, intestinal metaplasia, dysplasia or carcinoma.     10/12/2020 Procedure   EUS by Dr. Ardis Hughs  Impression: - The overall endoscopic appearance of the stomach, s/p wedge resection was [facility] IM[facility]OVED from 04/2020 EGD/EUS. The wedge 'anastomosis' was normal appearing. The previously ulcerated mucosa was normal now. There was very mild, non-specific pangastritis. Given the significantly improved appearance, no biopsies were taken. EUS evaluation reveals expected anatomically abnormal findings at the wedge resection site (related to post operative changes) but no significanly thickened gastric layers or masses.      INTERVAL HISTORY:  MARYJO RAGON is here for a follow up of GIST. She was last seen by me on 09/04/20. She presents to the clinic alone. She reports she is doing well overall. She denies any stomach issue.   All other systems were reviewed with the patient and are negative.  MEDICAL HISTORY:  Past Medical History:  Diagnosis Date   Anxiety    Cancer ([place]) 2021   stomach   Complication of anesthesia    Depression    Hyperlipidemia    Hypertension    Migraine    Osteopenia    PONV (postoperative nausea and vomiting)    Type II or unspecified type diabetes mellitus without mention of complication, uncontrolled 04/09/2013   Upper GI bleed 01/13/2020    SURGICAL HISTORY: Past Surgical History:  Procedure Laterality Date   ABDOMINAL HYSTERECTOMY     APPENDECTOMY     BIOPSY  01/13/2020   Procedure: BIOPSY;  Surgeon: Milus Banister, MD;  Location: [facility] ENDOSCOPY;  Service: Endoscopy;;   BIOPSY  04/13/2020   Procedure: BIOPSY;  Surgeon: Milus Banister, MD;  Location: [facility] ENDOSCOPY;  Service: Endoscopy;;   CHOLECYSTECTOMY     ESOPHAGOGASTRODUODENOSCOPY (EGD) WITH PROPOFOL N/A 01/13/2020   Procedure: ESOPHAGOGASTRODUODENOSCOPY (EGD) WITH PROPOFOL;  Surgeon:  Milus Banister, MD;  Location: [facility] ENDOSCOPY;  Service: Endoscopy;  Laterality: N/A;   ESOPHAGOGASTRODUODENOSCOPY (EGD) WITH PROPOFOL N/A 04/13/2020   Procedure: ESOPHAGOGASTRODUODENOSCOPY (EGD) WITH PROPOFOL;  Surgeon: Milus Banister, MD;  Location: [facility] ENDOSCOPY;  Service: Endoscopy;  Laterality: N/A;   ESOPHAGOGASTRODUODENOSCOPY (EGD) WITH PROPOFOL N/A 10/12/2020   Procedure: ESOPHAGOGASTRODUODENOSCOPY (EGD) WITH PROPOFOL;  Surgeon: Milus Banister, MD;  Location: [facility] ENDOSCOPY;  Service: Endoscopy;  Laterality: N/A;   EUS N/A 04/13/2020   Procedure: UPPER ENDOSCOPIC ULTRASOUND (EUS) LINEAR;  Surgeon: Milus Banister, MD;  Location: [facility] ENDOSCOPY;  Service: Endoscopy;  Laterality: N/A;   EUS N/A 10/12/2020   Procedure: UPPER ENDOSCOPIC ULTRASOUND (EUS) RADIAL;  Surgeon: Milus Banister, MD;  Location: [facility] ENDOSCOPY;  Service: Endoscopy;  Laterality: N/A;   PARTIAL GASTRECTOMY  02/28/2020    I have reviewed the social history and family history with the patient and they are unchanged from previous note.  ALLERGIES:  has No Known Allergies.  MEDICATIONS:  Current Outpatient Medications  Medication Sig Dispense Refill   acetaminophen (TYLENOL) 500 MG tablet Take 1,000 mg by mouth every 6 (six) hours as needed.  atorvastatin (LIPITOR) 20 MG tablet TAKE 1 TABLET BY MOUTH EVERY DAY (Patient taking differently: Take 20 mg by mouth daily.) 90 tablet 3   diltiazem (CARDIZEM CD) 240 MG 24 hr capsule TAKE 1 CAPSULE(240 MG) BY MOUTH DAILY 90 capsule 3   gabapentin (NEURONTIN) 100 MG capsule 1-2 tab by mouth three times per day as needed for pain 180 capsule 5   hydrochlorothiazide (MICROZIDE) 12.5 MG capsule Take 1 capsule (12.5 mg total) by mouth daily. 90 capsule 3   imatinib (GLEEVEC) 400 MG tablet Take 1 tablet (400 mg total) by mouth daily. Take with meals and large glass of water.Caution:Chemotherapy. 30 tablet 2   lisinopril (ZESTRIL) 20 MG tablet Take 0.5 tablets (10 mg total) by mouth daily.  45 tablet 3   metFORMIN (GLUCOPHAGE-XR) 500 MG 24 hr tablet Take 2 tablets (1,000 mg total) by mouth daily with breakfast. 180 tablet 3   ondansetron (ZOFRAN ODT) 8 MG disintegrating tablet Take 1 tablet (8 mg total) by mouth every 8 (eight) hours as needed for nausea or vomiting. 30 tablet 1   OVER THE COUNTER MEDICATION Take 1 capsule by mouth daily. Super Beets otc supplement     pantoprazole (PROTONIX) 40 MG tablet Take 1 tablet (40 mg total) by mouth 2 (two) times daily before a meal. TAKE 1 TABLET(40 MG) BY MOUTH TWICE DAILY BEFORE A MEAL (Patient taking differently: Take 40 mg by mouth 2 (two) times daily before a meal.) 180 tablet 2   Prenatal Vit-Fe Fumarate-FA (PRENATAL PO) Take 3 tablets by mouth daily.     trolamine salicylate (ASPERCREME) 10 % cream Apply 1 application topically as needed for muscle pain.     No current facility-administered medications for this visit.    PHYSICAL EXAMINATION: ECOG PERFORMANCE STATUS: 1 - Symptomatic but completely ambulatory  Vitals:   03/19/21 1124  BP: (!) 160/70  Pulse: 81  Resp: 18  Temp: 98.2 F (36.8 C)  SpO2: 100%   Wt Readings from Last 3 Encounters:  03/19/21 181 lb (82.1 kg)  11/15/20 183 lb (83 kg)  10/06/20 190 lb (86.2 kg)     GENERAL:alert, no distress and comfortable SKIN: skin color, texture, turgor are normal, no rashes or significant lesions EYES: normal, Conjunctiva are pink and non-injected, sclera clear  NECK: supple, thyroid normal size, non-tender, without nodularity LYMPH:  no palpable lymphadenopathy in the cervical, axillary  LUNGS: clear to auscultation and percussion with normal breathing effort HEART: regular rate & rhythm and no murmurs, (+) lower extremity edema ABDOMEN:abdomen soft, non-tender and normal bowel sounds Musculoskeletal:no cyanosis of digits and no clubbing  NEURO: alert & oriented x 3 with fluent speech, no focal motor/sensory deficits  LABORATORY DATA:  I have reviewed the data as  listed CBC Latest Ref Rng & Units 03/19/2021 11/15/2020 08/31/2020  WBC 4.0 - 10.5 K/uL 7.0 7.9 7.6  Hemoglobin 12.0 - 15.0 g/dL 11.5(L) 12.6 11.0(L)  Hematocrit 36.0 - 46.0 % 32.8(L) 37.2 32.2(L)  Platelets 150 - 400 K/uL 226 256.0 224     CMP Latest Ref Rng & Units 03/19/2021 11/15/2020 08/31/2020  Glucose 70 - 99 mg/dL 109(H) 112(H) 102(H)  BUN 8 - 23 mg/dL _0 Creatinine 0.44 - 1.00 mg/dL 0.93 0.81 0.79  Sodium 135 - 145 mmol/L 142 140 142  Potassium 3.5 - 5.1 mmol/L 3.6 4.0 3.6  Chloride 98 - 111 mmol/L 105 101 104  CO2 22 - 32 mmol/L _1 Calcium 8.9 - 10.3 mg/dL  9.4 9.7 9.3  Total Protein 6.5 - 8.1 g/dL 7.4 7.5 7.0  Total Bilirubin 0.3 - 1.2 mg/dL 0.5 0.4 0.5  Alkaline Phos 38 - 126 U/L 72 59 63  AST 15 - 41 U/L _0 ALT 0 - 44 U/L _1 RADIOGRAPHIC STUDIES: I have personally reviewed the radiological images as listed and agreed with the findings in the report. No results found.    Orders Placed This Encounter  Procedures   CT CHEST ABDOMEN PELVIS W CONTRAST    Standing Status:   Future    Standing Expiration Date:   03/19/2022    Order Specific Question:   Preferred imaging location?    Answer:   [facility]    Order Specific Question:   Is Oral Contrast requested for this exam?    Answer:   Yes, Per Radiology protocol    Order Specific Question:   Reason for Exam (SYMPTOM  OR DIAGNOSIS REQUIRED)    Answer:   rule out recurrence    All questions were answered. The patient knows to call the clinic with any problems, questions or concerns. No barriers to learning was detected. The total time spent in the appointment was 30 minutes.     Truitt Merle, MD 03/19/2021   I, Wilburn Mylar, am acting as scribe for Truitt Merle, MD.   I have reviewed the above documentation for accuracy and completeness, and I agree with the above.

## 2021-03-20 ENCOUNTER — Telehealth: Payer: Self-pay | Admitting: Hematology

## 2021-03-20 NOTE — Telephone Encounter (Signed)
Scheduled per sch msg. Called and spoke with patient. Confirmed appt  

## 2021-03-23 ENCOUNTER — Inpatient Hospital Stay: Payer: Medicare Other

## 2021-03-23 ENCOUNTER — Other Ambulatory Visit: Payer: Self-pay

## 2021-03-23 DIAGNOSIS — D500 Iron deficiency anemia secondary to blood loss (chronic): Secondary | ICD-10-CM

## 2021-03-23 DIAGNOSIS — C49A2 Gastrointestinal stromal tumor of stomach: Secondary | ICD-10-CM | POA: Diagnosis not present

## 2021-03-23 MED ORDER — CYANOCOBALAMIN 1000 MCG/ML IJ SOLN
1000.0000 ug | Freq: Once | INTRAMUSCULAR | Status: AC
  Administered 2021-03-23: 1000 ug via INTRAMUSCULAR
  Filled 2021-03-23: qty 1

## 2021-04-09 ENCOUNTER — Telehealth: Payer: Self-pay | Admitting: Internal Medicine

## 2021-04-09 MED ORDER — PANTOPRAZOLE SODIUM 40 MG PO TBEC: 40.0000 mg | DELAYED_RELEASE_TABLET | Freq: Two times a day (BID) | ORAL | 0 refills | Status: DC

## 2021-04-09 NOTE — Telephone Encounter (Signed)
1.Medication Requested: pantoprazole (PROTONIX) 40 MG tablet  2. Pharmacy (Name, Street, [place]):  [facility] [phone] - [place], [address] AT [place] Phone:  [phone]  Fax:  [phone]      3. On Med List: Y  4. Last Visit with PCP:  5. Next visit date with PCP: 12.8.2022   Agent: Please be advised that RX refills may take up to 3 business days. We ask that you follow-up with your pharmacy.

## 2021-04-10 ENCOUNTER — Telehealth: Payer: Self-pay | Admitting: Internal Medicine

## 2021-04-10 NOTE — Telephone Encounter (Signed)
Okay to let patient know if she calls back that Pantoprazole has been approved by insurance.

## 2021-04-10 NOTE — Telephone Encounter (Signed)
Patient states she spoke w/ her insurance company regarding covering cost for pantoprazole ([place]) 40 MG tablet   Patient states insurance company is requesting a reset override for the rx  Patient requesting calling the insurance company to reset override  Phone [phone]

## 2021-05-11 ENCOUNTER — Other Ambulatory Visit: Payer: Self-pay

## 2021-05-11 ENCOUNTER — Inpatient Hospital Stay: Payer: Medicare Other | Attending: Hematology

## 2021-05-11 DIAGNOSIS — E538 Deficiency of other specified B group vitamins: Secondary | ICD-10-CM | POA: Insufficient documentation

## 2021-05-11 DIAGNOSIS — D500 Iron deficiency anemia secondary to blood loss (chronic): Secondary | ICD-10-CM

## 2021-05-11 MED ORDER — CYANOCOBALAMIN 1000 MCG/ML IJ SOLN
1000.0000 ug | Freq: Once | INTRAMUSCULAR | Status: AC
  Administered 2021-05-11: 1000 ug via INTRAMUSCULAR
  Filled 2021-05-11: qty 1

## 2021-05-11 NOTE — Patient Instructions (Signed)

## 2021-05-17 ENCOUNTER — Encounter: Payer: Self-pay | Admitting: Internal Medicine

## 2021-05-17 ENCOUNTER — Ambulatory Visit (INDEPENDENT_AMBULATORY_CARE_PROVIDER_SITE_OTHER): Payer: Medicare Other | Admitting: Internal Medicine

## 2021-05-17 ENCOUNTER — Other Ambulatory Visit: Payer: Self-pay

## 2021-05-17 VITALS — BP 136/62 | HR 71 | Temp 98.2°F | Ht 62.0 in | Wt 186.0 lb

## 2021-05-17 DIAGNOSIS — E1165 Type 2 diabetes mellitus with hyperglycemia: Secondary | ICD-10-CM

## 2021-05-17 DIAGNOSIS — M5441 Lumbago with sciatica, right side: Secondary | ICD-10-CM | POA: Diagnosis not present

## 2021-05-17 DIAGNOSIS — M5442 Lumbago with sciatica, left side: Secondary | ICD-10-CM

## 2021-05-17 DIAGNOSIS — E559 Vitamin D deficiency, unspecified: Secondary | ICD-10-CM | POA: Diagnosis not present

## 2021-05-17 DIAGNOSIS — G8929 Other chronic pain: Secondary | ICD-10-CM

## 2021-05-17 DIAGNOSIS — I10 Essential (primary) hypertension: Secondary | ICD-10-CM

## 2021-05-17 DIAGNOSIS — E7800 Pure hypercholesterolemia, unspecified: Secondary | ICD-10-CM

## 2021-05-17 LAB — POCT GLYCOSYLATED HEMOGLOBIN (HGB A1C): Hemoglobin A1C: 6.6 % — AB (ref 4.0–5.6)

## 2021-05-17 NOTE — Patient Instructions (Signed)
Your A1c was done today  Please continue all other medications as before, and refills have been done if requested.  Please have the pharmacy call with any other refills you may need.  Please continue your efforts at being more active, low cholesterol diet, and weight control.  Please keep your appointments with your specialists as you may have planned  Please make an Appointment to return in 6 months, or sooner if needed

## 2021-05-17 NOTE — Progress Notes (Signed)
Patient ID: Bridget Grant, female   DOB: [birth year], 75 y.o.   MRN: [id number]        Chief Complaint: follow up HTN, HLD and hyperglycemia, low back pain       HPI:  Bridget Grant is a 75 y.o. female here overall doing well, fortunately lower back pain as per recent visit resolved prior to seeing NS and has not required further tx such as PT or cortisone.  Pt denies chest pain, increased sob or doe, wheezing, orthopnea, PND, increased LE swelling, palpitations, dizziness or syncope.   Pt denies polydipsia, polyuria, or new focal neuro s/s.   Pt denies fever, wt loss, night sweats, loss of appetite, or other constitutional symptoms   Has eye exam soon.  Admits to poor control with DM diet recently, plans to do better, hoping to avoid further med tx changes.        Wt Readings from Last 3 Encounters:  05/17/21 186 lb (84.4 kg)  03/19/21 181 lb (82.1 kg)  11/15/20 183 lb (83 kg)   BP Readings from Last 3 Encounters:  05/17/21 136/62  03/19/21 (!) 160/70  11/15/20 (!) 158/74         Past Medical History:  Diagnosis Date   Anxiety    Cancer ([place]) 2021   stomach   Complication of anesthesia    Depression    Hyperlipidemia    Hypertension    Migraine    Osteopenia    PONV (postoperative nausea and vomiting)    Type II or unspecified type diabetes mellitus without mention of complication, uncontrolled 04/09/2013   Upper GI bleed 01/13/2020   Past Surgical History:  Procedure Laterality Date   ABDOMINAL HYSTERECTOMY     APPENDECTOMY     BIOPSY  01/13/2020   Procedure: BIOPSY;  Surgeon: Milus Banister, MD;  Location: [facility] ENDOSCOPY;  Service: Endoscopy;;   BIOPSY  04/13/2020   Procedure: BIOPSY;  Surgeon: Milus Banister, MD;  Location: [facility] ENDOSCOPY;  Service: Endoscopy;;   CHOLECYSTECTOMY     ESOPHAGOGASTRODUODENOSCOPY (EGD) WITH PROPOFOL N/A 01/13/2020   Procedure: ESOPHAGOGASTRODUODENOSCOPY (EGD) WITH PROPOFOL;  Surgeon: Milus Banister, MD;  Location: [facility] ENDOSCOPY;  Service:  Endoscopy;  Laterality: N/A;   ESOPHAGOGASTRODUODENOSCOPY (EGD) WITH PROPOFOL N/A 04/13/2020   Procedure: ESOPHAGOGASTRODUODENOSCOPY (EGD) WITH PROPOFOL;  Surgeon: Milus Banister, MD;  Location: [facility] ENDOSCOPY;  Service: Endoscopy;  Laterality: N/A;   ESOPHAGOGASTRODUODENOSCOPY (EGD) WITH PROPOFOL N/A 10/12/2020   Procedure: ESOPHAGOGASTRODUODENOSCOPY (EGD) WITH PROPOFOL;  Surgeon: Milus Banister, MD;  Location: [facility] ENDOSCOPY;  Service: Endoscopy;  Laterality: N/A;   EUS N/A 04/13/2020   Procedure: UPPER ENDOSCOPIC ULTRASOUND (EUS) LINEAR;  Surgeon: Milus Banister, MD;  Location: [facility] ENDOSCOPY;  Service: Endoscopy;  Laterality: N/A;   EUS N/A 10/12/2020   Procedure: UPPER ENDOSCOPIC ULTRASOUND (EUS) RADIAL;  Surgeon: Milus Banister, MD;  Location: [facility] ENDOSCOPY;  Service: Endoscopy;  Laterality: N/A;   PARTIAL GASTRECTOMY  02/28/2020    reports that she has never smoked. She has never used smokeless tobacco. She reports that she does not drink alcohol and does not use drugs. family history includes Breast cancer (age of onset: 17) in her mother; Cancer in her maternal grandmother; Cancer (age of onset: 36) in her mother; Hypertension in her father. No Known Allergies Current Outpatient Medications on File Prior to Visit  Medication Sig Dispense Refill   atorvastatin (LIPITOR) 20 MG tablet TAKE 1 TABLET BY MOUTH EVERY DAY 90 tablet 3  diltiazem (CARDIZEM CD) 240 MG 24 hr capsule TAKE 1 CAPSULE(240 MG) BY MOUTH DAILY 90 capsule 3   hydrochlorothiazide (MICROZIDE) 12.5 MG capsule Take 1 capsule (12.5 mg total) by mouth daily. 90 capsule 3   lisinopril (ZESTRIL) 20 MG tablet Take 0.5 tablets (10 mg total) by mouth daily. 45 tablet 3   metFORMIN (GLUCOPHAGE-XR) 500 MG 24 hr tablet Take 2 tablets (1,000 mg total) by mouth daily with breakfast. 180 tablet 3   ondansetron (ZOFRAN ODT) 8 MG disintegrating tablet Take 1 tablet (8 mg total) by mouth every 8 (eight) hours as needed for nausea or vomiting. 30  tablet 1   OVER THE COUNTER MEDICATION Take 1 capsule by mouth daily. Super Beets otc supplement     pantoprazole (PROTONIX) 40 MG tablet Take 1 tablet (40 mg total) by mouth 2 (two) times daily before a meal. TAKE 1 TABLET(40 MG) BY MOUTH TWICE DAILY BEFORE A MEAL 180 tablet 0   Prenatal Vit-Fe Fumarate-FA (PRENATAL PO) Take 3 tablets by mouth daily.     trolamine salicylate (ASPERCREME) 10 % cream Apply 1 application topically as needed for muscle pain.     acetaminophen (TYLENOL) 500 MG tablet Take 1,000 mg by mouth every 6 (six) hours as needed. (Patient not taking: Reported on 05/17/2021)     gabapentin (NEURONTIN) 100 MG capsule 1-2 tab by mouth three times per day as needed for pain (Patient not taking: Reported on 05/17/2021) 180 capsule 5   [DISCONTINUED] diltiazem (TIAZAC) 240 MG 24 hr capsule Take 1 capsule (240 mg total) by mouth daily. 90 capsule 3   No current facility-administered medications on file prior to visit.        ROS:  All others reviewed and negative.  Objective        PE:  BP 136/62 (BP Location: Left Arm, Patient Position: Sitting, Cuff Size: Large)   Pulse 71   Temp 98.2 F (36.8 C) (Oral)   Ht 5\' 2"  (1.575 m)   Wt 186 lb (84.4 kg)   SpO2 98%   BMI 34.02 kg/m                 Constitutional: Pt appears in NAD               HENT: Head: NCAT.                Right Ear: External ear normal.                 Left Ear: External ear normal.                Eyes: . Pupils are equal, round, and reactive to light. Conjunctivae and EOM are normal               Nose: without d/c or deformity               Neck: Neck supple. Gross normal ROM               Cardiovascular: Normal rate and regular rhythm.                 Pulmonary/Chest: Effort normal and breath sounds without rales or wheezing.                Abd:  Soft, NT, ND, + BS, no organomegaly               Neurological: Pt is alert. At baseline orientation, motor grossly intact  Skin: Skin is warm. No  rashes, no other new lesions, LE edema - none               Psychiatric: Pt behavior is normal without agitation   Micro: none  Cardiac tracings I have personally interpreted today:  none  Pertinent Radiological findings (summarize): none   Lab Results  Component Value Date   WBC 7.0 03/19/2021   HGB 11.5 (L) 03/19/2021   HCT 32.8 (L) 03/19/2021   PLT 226 03/19/2021   GLUCOSE 109 (H) 03/19/2021   CHOL 174 11/15/2020   TRIG 263.0 (H) 11/15/2020   HDL 49.50 11/15/2020   LDLDIRECT 100.0 11/15/2020   LDLCALC 62 11/16/2019   ALT 19 03/19/2021   AST 22 03/19/2021   NA 142 03/19/2021   K 3.6 03/19/2021   CL 105 03/19/2021   CREATININE 0.93 03/19/2021   BUN 12 03/19/2021   CO2 25 03/19/2021   TSH 1.50 11/15/2020   INR 0.9 01/11/2020   HGBA1C 6.6 (A) 05/17/2021   MICROALBUR 1.1 11/16/2019   Hemoglobin A1C 4.0 - 5.6 % 6.6 Abnormal   6.4 R, CM  5.9 Abnormal   5.6 R, CM  8.6 High  R, CM  6.8 High  R, CM  8.3 High    Assessment/Plan:  Bridget Grant is a 75 y.o. White or Caucasian [1] female with  has a past medical history of Anxiety, Cancer ([place]) (9702), Complication of anesthesia, Depression, Hyperlipidemia, Hypertension, Migraine, Osteopenia, PONV (postoperative nausea and vomiting), Type II or unspecified type diabetes mellitus without mention of complication, uncontrolled (04/09/2013), and Upper GI bleed (01/13/2020).  Hyperlipidemia Lab Results  Component Value Date   LDLCALC 62 11/16/2019   Stable, pt to continue current statin lipitor 20   Essential hypertension BP Readings from Last 3 Encounters:  05/17/21 136/62  03/19/21 (!) 160/70  11/15/20 (!) 158/74   Stable, pt to continue medical treatment cardizem, hct, lisinopril   Low back pain Spontaneously resolved, no further tx needed,  to f/u any worsening symptoms or concerns   Diabetes ([place]) Lab Results  Component Value Date   HGBA1C 6.6 (A) 05/17/2021   Stable, pt to continue current medical treatment  metformin   Vitamin D deficiency Last vitamin D Lab Results  Component Value Date   VD25OH 18.03 (L) 11/15/2020   Low, reminded to start oral replacement  Followup: No follow-ups on file.  Cathlean Cower, MD 05/22/2021 4:30 AM [facility]

## 2021-05-21 ENCOUNTER — Other Ambulatory Visit: Payer: Self-pay | Admitting: Hematology

## 2021-05-21 DIAGNOSIS — C49A2 Gastrointestinal stromal tumor of stomach: Secondary | ICD-10-CM

## 2021-05-22 NOTE — Assessment & Plan Note (Signed)
Lab Results  Component Value Date   HGBA1C 6.6 (A) 05/17/2021   Stable, pt to continue current medical treatment metformin

## 2021-05-22 NOTE — Assessment & Plan Note (Signed)
BP Readings from Last 3 Encounters:  05/17/21 136/62  03/19/21 (!) 160/70  11/15/20 (!) 158/74   Stable, pt to continue medical treatment cardizem, hct, lisinopril

## 2021-05-22 NOTE — Assessment & Plan Note (Signed)
Lab Results  Component Value Date   LDLCALC 62 11/16/2019   Stable, pt to continue current statin lipitor 20

## 2021-05-22 NOTE — Assessment & Plan Note (Signed)
Last vitamin D Lab Results  Component Value Date   VD25OH 18.03 (L) 11/15/2020   Low, reminded to start oral replacement

## 2021-05-22 NOTE — Assessment & Plan Note (Signed)
Spontaneously resolved, no further tx needed,  to f/u any worsening symptoms or concerns

## 2021-06-14 ENCOUNTER — Other Ambulatory Visit: Payer: Medicare Other

## 2021-06-18 ENCOUNTER — Ambulatory Visit: Payer: Medicare Other | Admitting: Hematology

## 2021-06-22 ENCOUNTER — Ambulatory Visit: Payer: Medicare Other

## 2021-06-27 ENCOUNTER — Telehealth: Payer: Self-pay | Admitting: Internal Medicine

## 2021-06-27 NOTE — Telephone Encounter (Signed)
Please refill as per office routine med refill policy (all routine meds to be refilled for 3 mo or monthly (per pt preference) up to one year from last visit, then month to month grace period for 3 mo, then further med refills will have to be denied) ? ?

## 2021-06-29 MED ORDER — PANTOPRAZOLE SODIUM 40 MG PO TBEC: 40.0000 mg | DELAYED_RELEASE_TABLET | Freq: Two times a day (BID) | ORAL | 3 refills | Status: DC

## 2021-06-29 NOTE — Telephone Encounter (Signed)
Please refill as per office routine med refill policy (all routine meds to be refilled for 3 mo or monthly (per pt preference) up to one year from last visit, then month to month grace period for 3 mo, then further med refills will have to be denied) ? ?

## 2021-06-29 NOTE — Addendum Note (Signed)
Addended by: Terence Lux A on: 06/29/2021 04:52 PM   Modules accepted: Orders

## 2021-06-29 NOTE — Telephone Encounter (Signed)
1.Medication Requested: pantoprazole (PROTONIX) 40 MG tablet  2. Pharmacy (Name, Street, [place]): [facility] [phone] - [place], [address] AT [place]  Phone:  [phone] Fax:  [phone]   3. On Med List: yes  4. Last Visit with PCP: 12.08.22  5. Next visit date with PCP: 06.08.23   Agent: Please be advised that RX refills may take up to 3 business days. We ask that you follow-up with your pharmacy.

## 2021-07-02 ENCOUNTER — Ambulatory Visit (HOSPITAL_COMMUNITY)
Admission: RE | Admit: 2021-07-02 | Discharge: 2021-07-02 | Disposition: A | Discharge status: Home / Self Care | Payer: Medicare Other | Source: Ambulatory Visit | Attending: Hematology | Admitting: Hematology

## 2021-07-02 ENCOUNTER — Other Ambulatory Visit: Payer: Self-pay

## 2021-07-02 ENCOUNTER — Inpatient Hospital Stay: Payer: Medicare Other | Attending: Hematology

## 2021-07-02 DIAGNOSIS — K922 Gastrointestinal hemorrhage, unspecified: Secondary | ICD-10-CM | POA: Insufficient documentation

## 2021-07-02 DIAGNOSIS — K449 Diaphragmatic hernia without obstruction or gangrene: Secondary | ICD-10-CM | POA: Diagnosis not present

## 2021-07-02 DIAGNOSIS — E538 Deficiency of other specified B group vitamins: Secondary | ICD-10-CM | POA: Insufficient documentation

## 2021-07-02 DIAGNOSIS — I2510 Atherosclerotic heart disease of native coronary artery without angina pectoris: Secondary | ICD-10-CM | POA: Diagnosis not present

## 2021-07-02 DIAGNOSIS — I700 Atherosclerosis of aorta: Secondary | ICD-10-CM | POA: Diagnosis not present

## 2021-07-02 DIAGNOSIS — K5730 Diverticulosis of large intestine without perforation or abscess without bleeding: Secondary | ICD-10-CM | POA: Diagnosis not present

## 2021-07-02 DIAGNOSIS — K7689 Other specified diseases of liver: Secondary | ICD-10-CM | POA: Diagnosis not present

## 2021-07-02 DIAGNOSIS — C49A0 Gastrointestinal stromal tumor, unspecified site: Secondary | ICD-10-CM

## 2021-07-02 DIAGNOSIS — D500 Iron deficiency anemia secondary to blood loss (chronic): Secondary | ICD-10-CM | POA: Insufficient documentation

## 2021-07-02 LAB — IRON AND IRON BINDING CAPACITY (CC-WL,HP ONLY)
Iron: 139 ug/dL (ref 28–170)
Saturation Ratios: 49 % — ABNORMAL HIGH (ref 10.4–31.8)
TIBC: 284 ug/dL (ref 250–450)
UIBC: 145 ug/dL — ABNORMAL LOW (ref 148–442)

## 2021-07-02 LAB — CMP (CANCER CENTER ONLY)
ALT: 16 U/L (ref 0–44)
AST: 18 U/L (ref 15–41)
Albumin: 4.3 g/dL (ref 3.5–5.0)
Alkaline Phosphatase: 64 U/L (ref 38–126)
Anion gap: 9 (ref 5–15)
BUN: 12 mg/dL (ref 8–23)
CO2: 30 mmol/L (ref 22–32)
Calcium: 9.5 mg/dL (ref 8.9–10.3)
Chloride: 103 mmol/L (ref 98–111)
Creatinine: 0.86 mg/dL (ref 0.44–1.00)
GFR, Estimated: >60 mL/min (ref >60)
Glucose, Bld: 115 mg/dL — ABNORMAL HIGH (ref 70–99)
Potassium: 4 mmol/L (ref 3.5–5.1)
Sodium: 142 mmol/L (ref 135–145)
Total Bilirubin: 0.7 mg/dL (ref 0.3–1.2)
Total Protein: 7 g/dL (ref 6.5–8.1)

## 2021-07-02 LAB — CBC WITH DIFFERENTIAL (CANCER CENTER ONLY)
Abs Immature Granulocytes: 0.02 10*3/uL (ref 0.00–0.07)
Basophils Absolute: 0.1 10*3/uL (ref 0.0–0.1)
Basophils Relative: 1 %
Eosinophils Absolute: 0.2 10*3/uL (ref 0.0–0.5)
Eosinophils Relative: 3 %
HCT: 33.2 % — ABNORMAL LOW (ref 36.0–46.0)
Hemoglobin: 11.1 g/dL — ABNORMAL LOW (ref 12.0–15.0)
Immature Granulocytes: 0 %
Lymphocytes Relative: 25 %
Lymphs Abs: 1.5 10*3/uL (ref 0.7–4.0)
MCH: 34.8 pg — ABNORMAL HIGH (ref 26.0–34.0)
MCHC: 33.4 g/dL (ref 30.0–36.0)
MCV: 104.1 fL — ABNORMAL HIGH (ref 80.0–100.0)
Monocytes Absolute: 0.7 10*3/uL (ref 0.1–1.0)
Monocytes Relative: 12 %
Neutro Abs: 3.7 10*3/uL (ref 1.7–7.7)
Neutrophils Relative %: 59 %
Platelet Count: 232 10*3/uL (ref 150–400)
RBC: 3.19 MIL/uL — ABNORMAL LOW (ref 3.87–5.11)
RDW: 13.2 % (ref 11.5–15.5)
WBC Count: 6.3 10*3/uL (ref 4.0–10.5)
nRBC: 0 % (ref 0.0–0.2)

## 2021-07-02 LAB — RETIC PANEL
Immature Retic Fract: 14.5 % (ref 2.3–15.9)
RBC.: 3.11 MIL/uL — ABNORMAL LOW (ref 3.87–5.11)
Retic Count, Absolute: 53.2 10*3/uL (ref 19.0–186.0)
Retic Ct Pct: 1.7 % (ref 0.4–3.1)
Reticulocyte Hemoglobin: 38.6 pg (ref >27.9)

## 2021-07-02 LAB — FERRITIN: Ferritin: 344 ng/mL — ABNORMAL HIGH (ref 11–307)

## 2021-07-02 IMAGING — CT CT CHEST-ABD-PELV W/ CM
3 of 5 series · 15 of 36 positions shown, 17 images · IV contrast (agent unspecified)
Comparison: Abdomen/pelvis CT [DATE]

CLINICAL DATA: GI stromal tumor.  Restaging.

EXAM:
CT CHEST, ABDOMEN, AND PELVIS WITH CONTRAST
TECHNIQUE: Multidetector CT imaging of the chest, abdomen and pelvis was
performed following the standard protocol during bolus
administration of intravenous contrast.

[Series 2: cap with · axial · 0.98mm/px · z∈[-794,-299]mm · 10 of 123 slices shown, 12 images]
[im 12/123  mediastinal]
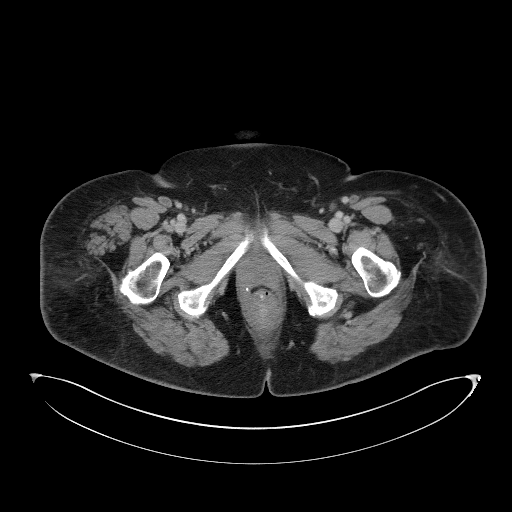
[im 12/123  bone]
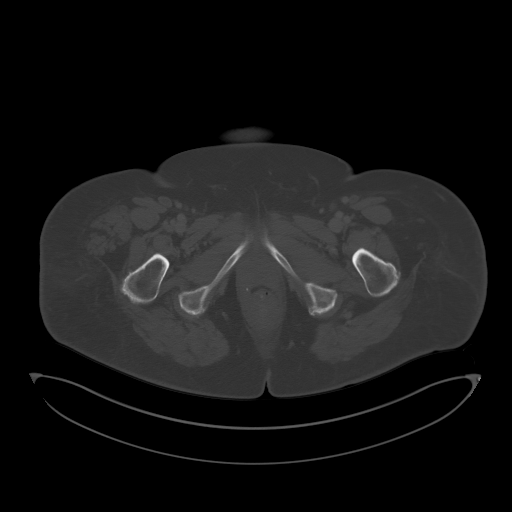
[im 23/123  mediastinal]
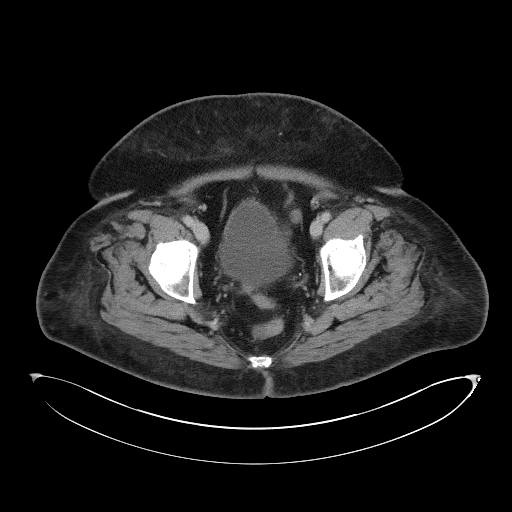
[im 34/123  mediastinal]
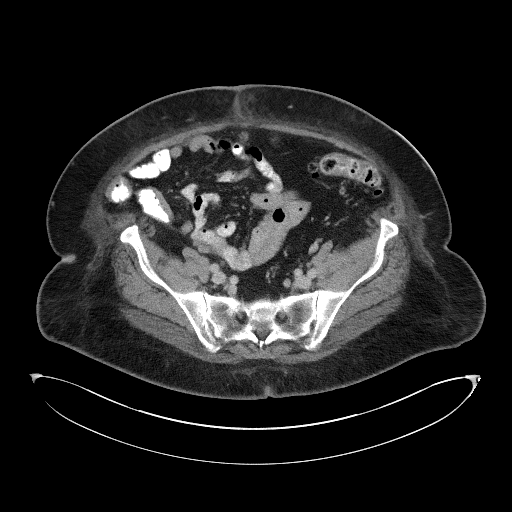
[im 45/123  mediastinal]
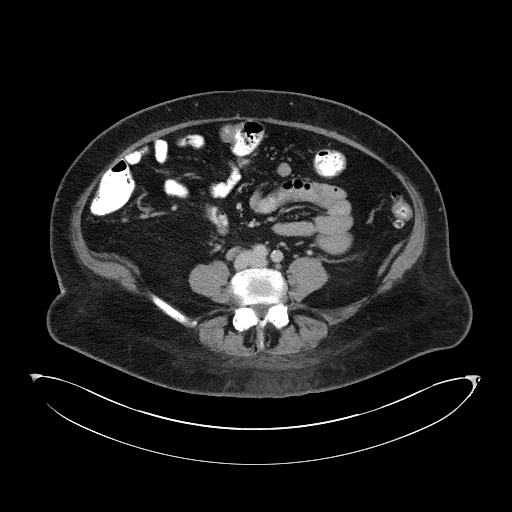
[im 56/123  mediastinal]
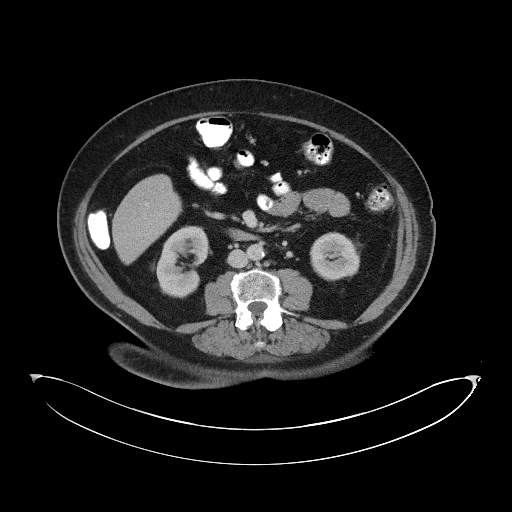
[im 67/123  mediastinal]
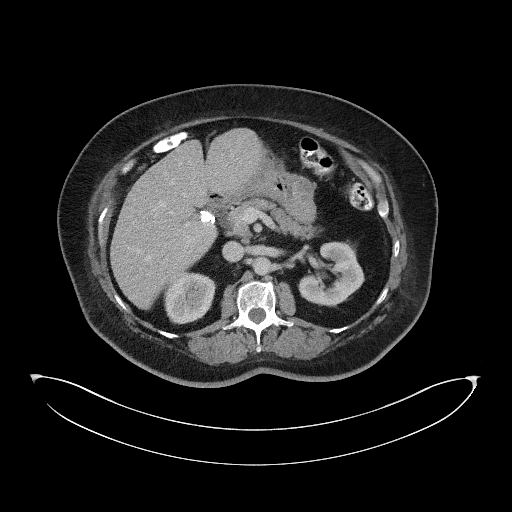
[im 78/123  mediastinal]
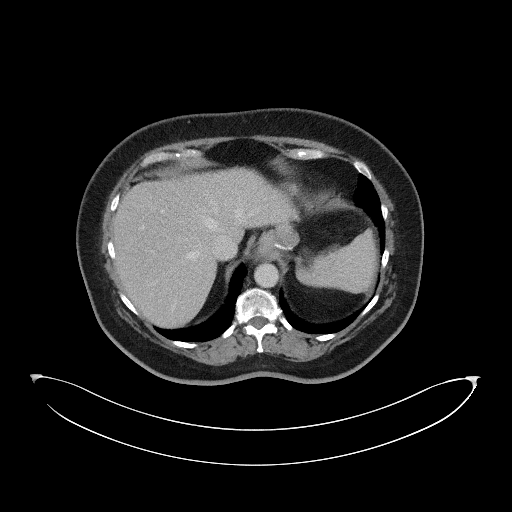
[im 89/123  mediastinal]
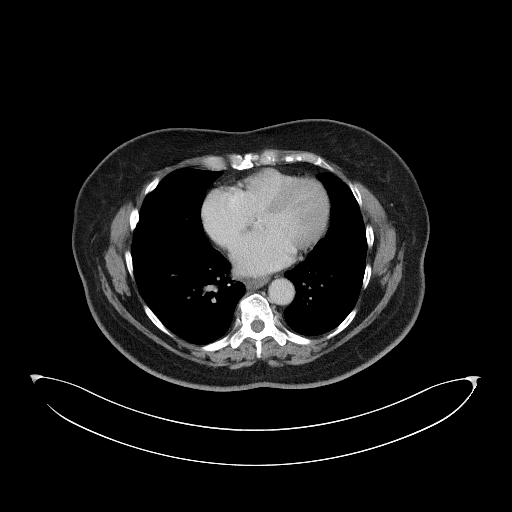
[im 100/123  mediastinal]
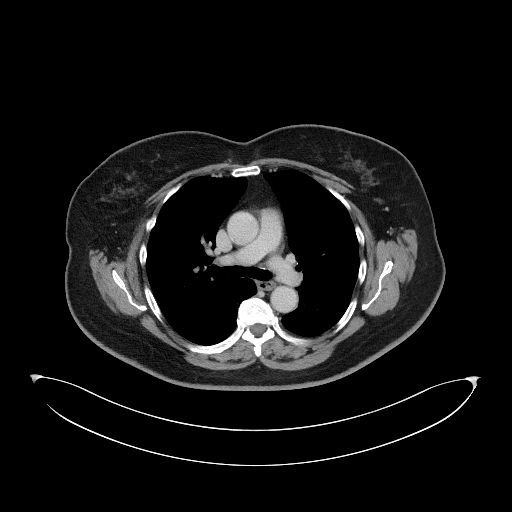
[im 100/123  bone]
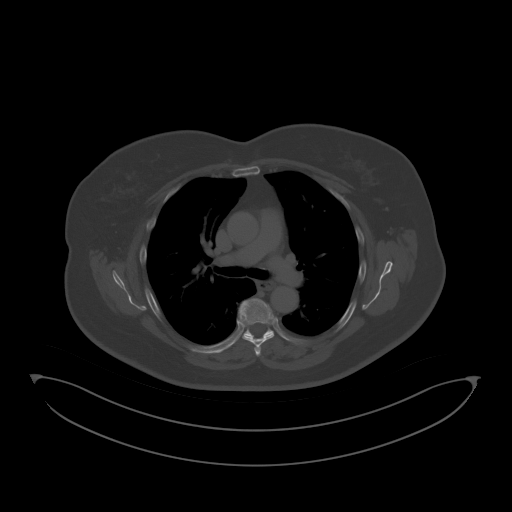
[im 111/123  mediastinal]
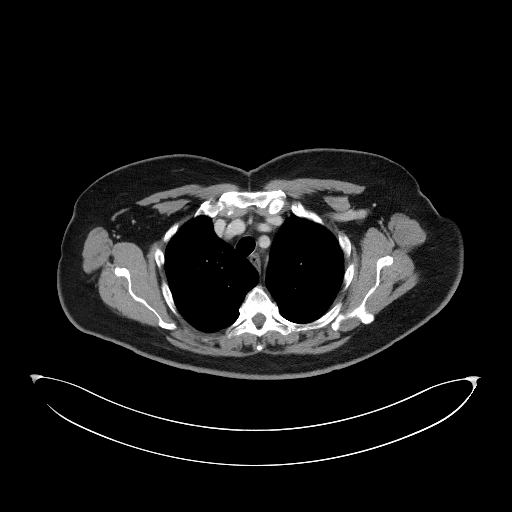

[Series 5: coronals · coronal · 0.94mm/px · 3 of 173 slices shown]
[im 35/173  mediastinal]
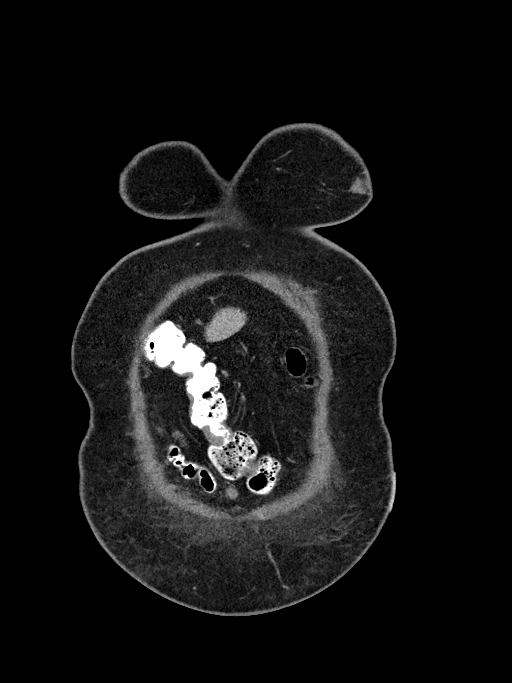
[im 69/173  mediastinal]
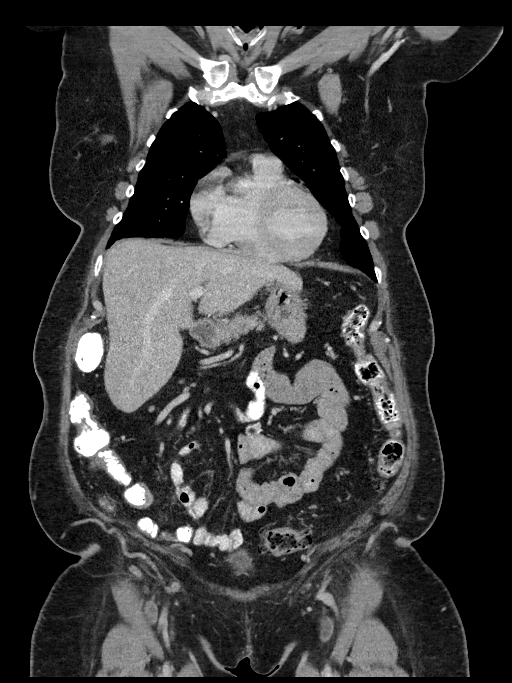
[im 104/173  mediastinal]
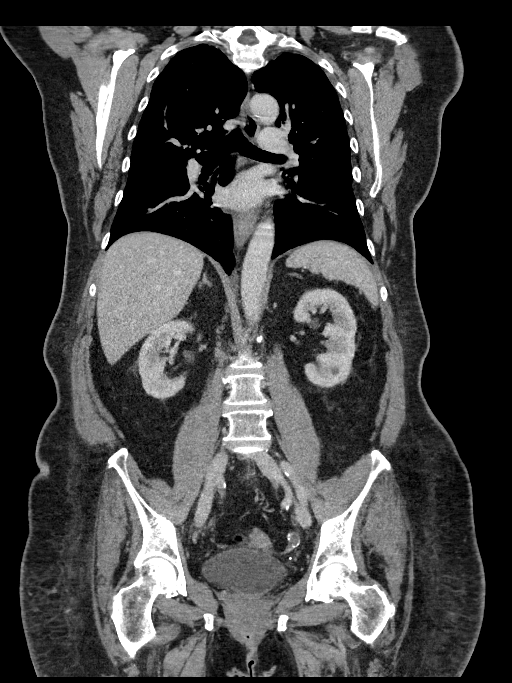

[Series 8: lung · axial · 0.98mm/px · z∈[-481,-437]mm · 2 of 132 slices shown]
[im 11/132  bone]
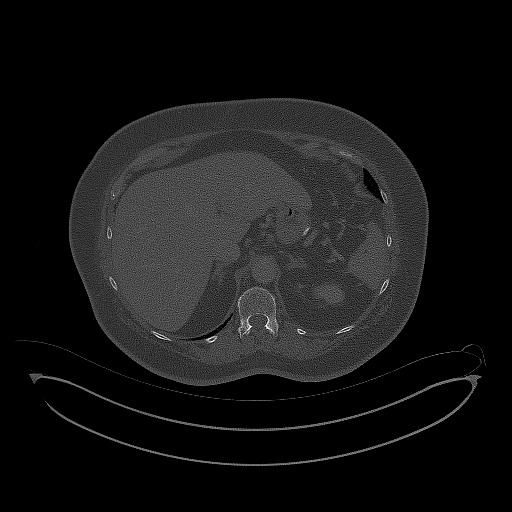
[im 33/132  bone]
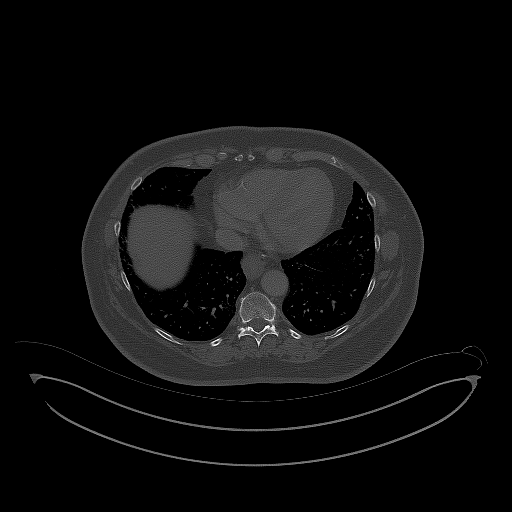

[15 of 36 positions shown; findings below may reference images not displayed]

RADIATION DOSE REDUCTION: This exam was performed according to the
departmental dose-optimization program which includes automated
exposure control, adjustment of the mA and/or kV according to
patient size and/or use of iterative reconstruction technique.

CONTRAST:  100mL OMNIPAQUE IOHEXOL 300 MG/ML  SOLN
FINDINGS: CT CHEST FINDINGS

Cardiovascular: The heart size is normal. No substantial pericardial
effusion. Coronary artery calcification is evident. Mild
atherosclerotic calcification is noted in the wall of the thoracic
aorta.

Mediastinum/Nodes: No mediastinal lymphadenopathy. There is no hilar
lymphadenopathy. Small hiatal hernia. The esophagus has normal
imaging features. There is no axillary lymphadenopathy.

Lungs/Pleura: No suspicious pulmonary nodule or mass. No focal
airspace consolidation. There is no evidence of pleural effusion.

Musculoskeletal: No worrisome lytic or sclerotic osseous
abnormality. 2.1 cm left breast lesion measures water density,
likely a cyst.

CT ABDOMEN PELVIS FINDINGS

Hepatobiliary: Stable 15 mm hypoattenuating lesion in the lateral
segment left liver (55/2). Gallbladder is surgically absent. No
intrahepatic or extrahepatic biliary dilation.

Pancreas: No focal mass lesion. No dilatation of the main duct. No
intraparenchymal cyst. No peripancreatic edema.

Spleen: No splenomegaly. No focal mass lesion.

Adrenals/Urinary Tract: Stable tiny left adrenal adenoma. No right
adrenal nodule or mass. Tiny cyst lower pole right kidney is similar
to prior. Tiny low-density lesion interpolar left kidney also likely
a cyst. No evidence for hydroureter. The urinary bladder appears
normal for the degree of distention.

Stomach/Bowel: Postsurgical changes noted in the stomach. Duodenum
is normally positioned as is the ligament of Treitz. No small bowel
wall thickening. No small bowel dilatation. The terminal ileum is
normal. The appendix is not well visualized, but there is no edema
or inflammation in the region of the cecum. No gross colonic mass.
No colonic wall thickening. Diverticular changes are noted in the
left colon without evidence of diverticulitis.

Vascular/Lymphatic: There is mild atherosclerotic calcification of
the abdominal aorta without aneurysm. Stable 12 mm portal caval
also unchanged. No pelvic sidewall lymphadenopathy.

Reproductive: Uterus surgically absent.  There is no adnexal mass.

Other: No intraperitoneal free fluid.

Musculoskeletal: Small umbilical hernia contains only fat. No
worrisome lytic or sclerotic osseous abnormality.
IMPRESSION: 1. Stable exam. No new or progressive findings to suggest recurrent
or metastatic disease.
2. Stable 15 mm hypoattenuating lesion in the lateral segment left
liver, likely benign. Continued attention on follow-up recommended.
3. Small hiatal hernia.
4. Left colonic diverticulosis without diverticulitis.
5. Aortic Atherosclerosis ([JL]-[JL]).

## 2021-07-02 MED ORDER — IOHEXOL 300 MG/ML  SOLN
100.0000 mL | Freq: Once | INTRAMUSCULAR | Status: AC | PRN
  Administered 2021-07-02: 100 mL via INTRAVENOUS

## 2021-07-02 MED ORDER — SODIUM CHLORIDE (PF) 0.9 % IJ SOLN
INTRAMUSCULAR | Status: AC
  Filled 2021-07-02: qty 50

## 2021-07-06 ENCOUNTER — Inpatient Hospital Stay (HOSPITAL_BASED_OUTPATIENT_CLINIC_OR_DEPARTMENT_OTHER): Payer: Medicare Other | Admitting: Hematology

## 2021-07-06 ENCOUNTER — Encounter: Payer: Self-pay | Admitting: Hematology

## 2021-07-06 ENCOUNTER — Other Ambulatory Visit: Payer: Self-pay

## 2021-07-06 ENCOUNTER — Inpatient Hospital Stay: Payer: Medicare Other

## 2021-07-06 VITALS — BP 164/62 | HR 73 | Temp 98.6°F | Resp 17 | Wt 195.2 lb

## 2021-07-06 DIAGNOSIS — D500 Iron deficiency anemia secondary to blood loss (chronic): Secondary | ICD-10-CM | POA: Diagnosis not present

## 2021-07-06 DIAGNOSIS — K922 Gastrointestinal hemorrhage, unspecified: Secondary | ICD-10-CM | POA: Diagnosis not present

## 2021-07-06 DIAGNOSIS — C49A2 Gastrointestinal stromal tumor of stomach: Secondary | ICD-10-CM | POA: Diagnosis not present

## 2021-07-06 DIAGNOSIS — E538 Deficiency of other specified B group vitamins: Secondary | ICD-10-CM | POA: Diagnosis not present

## 2021-07-06 MED ORDER — IMATINIB MESYLATE 400 MG PO TABS: ORAL_TABLET | ORAL | 2 refills | Status: DC

## 2021-07-06 MED ORDER — CYANOCOBALAMIN 1000 MCG/ML IJ SOLN
1000.0000 ug | Freq: Once | INTRAMUSCULAR | Status: AC
  Administered 2021-07-06: 1000 ug via INTRAMUSCULAR
  Filled 2021-07-06: qty 1

## 2021-07-06 NOTE — Progress Notes (Signed)
[facility]   [phone]) [phone] Fax:(336) [phone]   Clinic Follow up Note   Patient Care Team: [name], MD as PCP - Charissa Bash, MD as Consulting Physician (Oncology) Jonnie Finner, RN (Inactive) as Oncology Nurse Navigator Stark Klein, MD as Consulting Physician (General Surgery) Milus Banister, MD as Attending Physician (Gastroenterology)  Date of Service:  07/06/2021  CHIEF COMPLAINT: f/u of GIST  CURRENT THERAPY:  Gleevec 464m once daily starting 01/27/20 B12 injection monthly starting 04/14/20. Lost f/u after 11/2020, restart every 6 weeks in 03/2021  ASSESSMENT & PLAN:  Bridget JASMERis a 76y.o. female with   1. Gastric GIST, ypT3NxM0,  High grade, Kit mutation (+)  -She was diagnosed in 01/2020 with High grade gastrointestinal stromal tumor.  -Her staging CT CAP from 01/13/20 did not show gastric mass (I think the greater curvature of gastric wall is thickened) but did show incidental findings of left breast mass and 6.558msclerotic lesion at T7.  -Given her bleeding tumor, large size and other work up, I started her on Gleevec 40066mn 01/27/20, prior to surgery.  -She underwent surgery on 02/29/20 with Dr ByeBarry Dienesurgical path shows: high-grade GIST spanning 8 cm infiltrating through most of the distal gastric wall, with a positive distal resection margin, staged pT3; areas of rhabdomyosarcomatous differentiation but is not felt to represent a mixed rhabdomyosarcoma/GIST tumor. Unfortunately, given these features she is at very high risk for recurrence and metastasis.  -She underwent EUS on 04/13/20 which showed no evidence of malignancy or GIST. Although biopsy does not show GIST, Dr JacArdis Hughss concern for residual disease.  -repeat EUS on 10/12/20 showed significant improvement. -most recent CT AP from 07/02/21 showed no evidence of recurrence.  She has a stable 15 mm liver lesion which has been stable, likely benign.  I reviewed the results  with her today. -She is reluctant to repeat EUS, agrees to repeat in later 2023. -she is on adjuvant Imatinib, will continue, for a total of 3 years.  She is tolerating well  -lab and f/u in 3 months   2. Anemia secondary to GI bleeding from #1, B12 deficiency  -She was hospitalized on 01/12/20 due to severe anemia with Hg 7.9.  -She was treated with blood transfusion on 01/17/20 and 03/06/20 and IV Feraheme on 01/15/20 and 02/09/20 -she was previously on monthly B12 injections, last 12/05/20.  lost f/u after 11/2020 -B12 level 237 today, will restart at every 6 weeks      PLAN:  -proceed with B12 injection today -Continue Gleevec 400m11mce daily, refilled today -B12 injection every 6 weeks  -lab and f/u in 3 months   No problem-specific Assessment & Plan notes found for this encounter.   SUMMARY OF ONCOLOGIC HISTORY: Oncology History Overview Note  Cancer Staging Malignant gastrointestinal stromal tumor (GIST) of stomach (HCC) Staging form: Gastrointestinal Stromal Tumor - Gastric and Omental GIST, AJCC 8th Edition - Pathologic stage from 02/29/2020: pT3, Mitotic Rate: High - Unsigned Histologic grade (G): High grade Histologic grading system: 2 grade system Location of the gastrointestinal stromal tumor: Stomach    Malignant gastrointestinal stromal tumor (GIST) of stomach (HCC)[place]/10/2019 Procedure   EGD/Upper Endoscopy by Dr JacoArdis Hughs/21  IMPRESSION - There was an ulcerated mass along the greater curvature of the stomach, proximal edge was about 5-6cm from the GE junction and the mass was 7-8cm long, 2-3cm wide. It was not circumferental. The mass was friable with spontaneous  oozing, contact oozing, ulcerated and heaped up. Some parts of the mass were quite soft whereas other areas were more firm. This was very suspicious for a malignancy and it was biopsied extensively. There was mild distal gastritis, biopsied to check for H. pylori. - The examination was otherwise normal.    01/13/2020 Initial Biopsy   FINAL MICROSCOPIC DIAGNOSIS:  A. STOMACH, BIOPSY:  - High grade gastrointestinal stromal tumor, see comment.   B. STOMACH, DISTAL, BIOPSY:  - Mild reactive gastropathy with erosions.  - Warthin-Starry is negative for Helicobacter pylori.  - No intestinal metaplasia, dysplasia, or malignancy.  COMMENT:  A. The tumor has spindle and epithelioid components and appears high  grade (7 mitosis/20 hpf). Immunohistochemistry is positive for CD117 and  CD34. The cells are negative for cytokeratin 20, cytokeratin 7,  cytokeratin 5/6, CDX-2, SMA, and desmin. The findings are consistent  with a gastrointestinal stromal tumor. Dr. Vic Ripper has reviewed the  case. Dr. Ardis Hughs' nurse was notified on 01/18/2020.     01/13/2020 Imaging   CT CAP W contrast 01/13/20  IMPRESSION: 1. 2.1 cm x 1.1 cm well-defined area of low attenuation within the soft tissues of the left breast. This may represent a lymph node, however, correlation with mammography is recommended. 2. 6.5 mm sclerotic focus within the posterior aspect of the T7 vertebral body. This may represent a bone island, however, correlation with follow-up abdomen pelvis CT is recommended to exclude the presence of a metastatic lesion. 3. Small hiatal hernia. 4. 2.6 cm x 2.0 cm fat containing umbilical hernia. 5. Aortic atherosclerosis.    01/23/2020 Initial Diagnosis   Malignant gastrointestinal stromal tumor (GIST) of stomach ([place])   01/26/2020 -  Chemotherapy   Gleevec 420m once daily starting 01/26/20    02/11/2020 PET scan   IMPRESSION: 1. Focal mass along the stomach fundus with maximum SUV 15.7, compatible with malignancy. No findings of metastatic spread. 2. Mildly increased sub solid nodularity in the left upper lobe with only very subtle associated activity, likely inflammatory. Incidental note is made of some secondary pulmonary lobular septal thickening in the lung apices, and mild interstitial edema is  not excluded given the underlying cardiomegaly. 3. Other imaging findings of potential clinical significance: Aortic Atherosclerosis (ICD10-I70.0). Coronary atherosclerosis. Sigmoid colon diverticulosis. Small umbilical hernia contains adipose tissue. Physiologic activity along the costovertebral joints.   02/29/2020 Surgery   Partial gastrectomy by Dr. BBarry Dienes   02/29/2020 Pathology Results   FINAL MICROSCOPIC DIAGNOSIS:  A. STOMACH, PARTIAL GASTRECTOMY:  - High grade gastrointestinal stromal tumor with dedifferentiation,  spanning approximately 8 cm  - Tumor infiltrates through most of the distal gastric wall and involves  the distal resection margin  - See oncology table  - See comment  Immunohistochemical stains show that the tumor cells are positive for  CD117, CD34 and SMA (patchy).  A component of the tumor shows very  high-grade features with loss of CD117 and CD34 expression, consistent  with dedifferentiation.  Tumor also shows areas with  rhabdomyosarcomatous differentiation with presence of desmin expression.    04/13/2020 Procedure   EUS  by Dr JArdis Hughs IMPRESSION - The staple line from recent partial gastrectomy was noted in the proximal stomach, runs longitudinally along the gastric fundus. The mucosa immediately around the stable line was normal appearing however in the more distal gastric body (anterior wall likely) the mucosa was quite irregular appearing; friable, firm, slightly ulcerated for 4-5cm at least. This was not a discrete tumor, and these changes  were about 5cm from the pylorus. I sampled this region extensively with forceps following EUS evaluation below.- The muscularis propria was abnormally thickened starting around the staple line and continuing throughout most of the stomach distally. This thickening was not circumferential.     FINAL MICROSCOPIC DIAGNOSIS:   A. GASTRIC BODY, BIOPSY:  - Gastric mucosa with changes consistent with reactive  gastropathy.  - No spindle cell lesion, intestinal metaplasia, dysplasia or carcinoma.     10/12/2020 Procedure   EUS by Dr. Ardis Hughs  Impression: - The overall endoscopic appearance of the stomach, s/p wedge resection was [facility] IMPROVED from 04/2020 EGD/EUS. The wedge 'anastomosis' was normal appearing. The previously ulcerated mucosa was normal now. There was very mild, non-specific pangastritis. Given the significantly improved appearance, no biopsies were taken. EUS evaluation reveals expected anatomically abnormal findings at the wedge resection site (related to post operative changes) but no significanly thickened gastric layers or masses.   07/02/2021 Imaging   EXAM: CT CHEST, ABDOMEN, AND PELVIS WITH CONTRAST  IMPRESSION: 1. Stable exam. No new or progressive findings to suggest recurrent or metastatic disease. 2. Stable 15 mm hypoattenuating lesion in the lateral segment left liver, likely benign. Continued attention on follow-up recommended. 3. Small hiatal hernia. 4. Left colonic diverticulosis without diverticulitis. 5. Aortic Atherosclerosis (ICD10-I70.0).      INTERVAL HISTORY:  Bridget Grant is here for a follow up of GIST. She was last seen by me on 03/19/21. She presents to the clinic alone.   She is doing well, has good appetite and gained 10 lbs in past few months Occasional nausea, not often  Tolerates imatinib well, no diarrhea or constipation  No other pain or complains    All other systems were reviewed with the patient and are negative.  MEDICAL HISTORY:  Past Medical History:  Diagnosis Date   Anxiety    Cancer ([place]) 2021   stomach   Complication of anesthesia    Depression    Hyperlipidemia    Hypertension    Migraine    Osteopenia    PONV (postoperative nausea and vomiting)    Type II or unspecified type diabetes mellitus without mention of complication, uncontrolled 04/09/2013   Upper GI bleed 01/13/2020    SURGICAL HISTORY: Past Surgical  History:  Procedure Laterality Date   ABDOMINAL HYSTERECTOMY     APPENDECTOMY     BIOPSY  01/13/2020   Procedure: BIOPSY;  Surgeon: Milus Banister, MD;  Location: [facility] ENDOSCOPY;  Service: Endoscopy;;   BIOPSY  04/13/2020   Procedure: BIOPSY;  Surgeon: Milus Banister, MD;  Location: [facility] ENDOSCOPY;  Service: Endoscopy;;   CHOLECYSTECTOMY     ESOPHAGOGASTRODUODENOSCOPY (EGD) WITH PROPOFOL N/A 01/13/2020   Procedure: ESOPHAGOGASTRODUODENOSCOPY (EGD) WITH PROPOFOL;  Surgeon: Milus Banister, MD;  Location[facility] ENDOSCOPY;  Service: Endoscopy;  Laterality: N/A;   ESOPHAGOGASTRODUODENOSCOPY (EGD) WITH PROPOFOL N/A 04/13/2020   Procedure: ESOPHAGOGASTRODUODENOSCOPY (EGD) WITH PROPOFOL;  Surgeon: Milus Banister, MD;  Location: [facility] ENDOSCOPY;  Service: Endoscopy;  Laterality: N/A;   ESOPHAGOGASTRODUODENOSCOPY (EGD) WITH PROPOFOL N/A 10/12/2020   Procedure: ESOPHAGOGASTRODUODENOSCOPY (EGD) WITH PROPOFOL;  Surgeon: Milus Banister, MD;  Location: [facility] ENDOSCOPY;  Service: Endoscopy;  Laterality: N/A;   EUS N/A 04/13/2020   Procedure: UPPER ENDOSCOPIC ULTRASOUND (EUS) LINEAR;  Surgeon: Milus Banister, MD;  Location: [facility] ENDOSCOPY;  Service: Endoscopy;  Laterality: N/A;   EUS N/A 10/12/2020   Procedure: UPPER ENDOSCOPIC ULTRASOUND (EUS) RADIAL;  Surgeon: Milus Banister, MD;  Location: [facility] ENDOSCOPY;  Service: Endoscopy;  Laterality: N/A;   PARTIAL GASTRECTOMY  02/28/2020    I have reviewed the social history and family history with the patient and they are unchanged from previous note.  ALLERGIES:  has No Known Allergies.  MEDICATIONS:  Current Outpatient Medications  Medication Sig Dispense Refill   acetaminophen (TYLENOL) 500 MG tablet Take 1,000 mg by mouth every 6 (six) hours as needed. (Patient not taking: Reported on 05/17/2021)     atorvastatin (LIPITOR) 20 MG tablet TAKE 1 TABLET BY MOUTH EVERY DAY 90 tablet 3   diltiazem (CARDIZEM CD) 240 MG 24 hr capsule TAKE 1 CAPSULE(240 MG) BY MOUTH DAILY 90  capsule 3   gabapentin (NEURONTIN) 100 MG capsule 1-2 tab by mouth three times per day as needed for pain (Patient not taking: Reported on 05/17/2021) 180 capsule 5   hydrochlorothiazide (MICROZIDE) 12.5 MG capsule Take 1 capsule (12.5 mg total) by mouth daily. 90 capsule 3   imatinib (GLEEVEC) 400 MG tablet TAKE 1 TABLET BY MOUTH 1 TIME A DAY. 30 tablet 1   lisinopril (ZESTRIL) 20 MG tablet Take 0.5 tablets (10 mg total) by mouth daily. 45 tablet 3   metFORMIN (GLUCOPHAGE-XR) 500 MG 24 hr tablet Take 2 tablets (1,000 mg total) by mouth daily with breakfast. 180 tablet 3   ondansetron (ZOFRAN ODT) 8 MG disintegrating tablet Take 1 tablet (8 mg total) by mouth every 8 (eight) hours as needed for nausea or vomiting. 30 tablet 1   OVER THE COUNTER MEDICATION Take 1 capsule by mouth daily. Super Beets otc supplement     pantoprazole (PROTONIX) 40 MG tablet Take 1 tablet (40 mg total) by mouth 2 (two) times daily before a meal. TAKE 1 TABLET(40 MG) BY MOUTH TWICE DAILY BEFORE A MEAL 180 tablet 3   Prenatal Vit-Fe Fumarate-FA (PRENATAL PO) Take 3 tablets by mouth daily.     trolamine salicylate (ASPERCREME) 10 % cream Apply 1 application topically as needed for muscle pain.     No current facility-administered medications for this visit.    PHYSICAL EXAMINATION: ECOG PERFORMANCE STATUS: 0 - Asymptomatic  Vitals:   07/06/21 1400  BP: (!) 164/62  Pulse: 73  Resp: 17  Temp: 98.6 F (37 C)  SpO2: 100%   Wt Readings from Last 3 Encounters:  07/06/21 195 lb 3 oz (88.5 kg)  05/17/21 186 lb (84.4 kg)  03/19/21 181 lb (82.1 kg)     GENERAL:alert, no distress and comfortable SKIN: skin color, texture, turgor are normal, no rashes or significant lesions EYES: normal, Conjunctiva are pink and non-injected, sclera clear NECK: supple, thyroid normal size, non-tender, without nodularity LYMPH:  no palpable lymphadenopathy in the cervical, axillary  LUNGS: clear to auscultation and percussion with  normal breathing effort HEART: regular rate & rhythm and no murmurs and no lower extremity edema ABDOMEN:abdomen soft, non-tender and normal bowel sounds Musculoskeletal:no cyanosis of digits and no clubbing  NEURO: alert & oriented x 3 with fluent speech, no focal motor/sensory deficits  LABORATORY DATA:  I have reviewed the data as listed CBC Latest Ref Rng & Units 07/02/2021 03/19/2021 11/15/2020  WBC 4.0 - 10.5 K/uL 6.3 7.0 7.9  Hemoglobin 12.0 - 15.0 g/dL 11.1(L) 11.5(L) 12.6  Hematocrit 36.0 - 46.0 % 33.2(L) 32.8(L) 37.2  Platelets 150 - 400 K/uL 232 226 256.0     CMP Latest Ref Rng & Units 07/02/2021 03/19/2021 11/15/2020  Glucose 70 - 99 mg/dL 115(H) 109(H) 112(H)  BUN 8 - 23 mg/dL 12 12  17  Creatinine 0.44 - 1.00 mg/dL 0.86 0.93 0.81  Sodium 135 - 145 mmol/L 142 142 140  Potassium 3.5 - 5.1 mmol/L 4.0 3.6 4.0  Chloride 98 - 111 mmol/L 103 105 101  CO2 22 - 32 mmol/L 30 25 31   Calcium 8.9 - 10.3 mg/dL 9.5 9.4 9.7  Total Protein 6.5 - 8.1 g/dL 7.0 7.4 7.5  Total Bilirubin 0.3 - 1.2 mg/dL 0.7 0.5 0.4  Alkaline Phos 38 - 126 U/L 64 72 59  AST 15 - 41 U/L 18 22 17   ALT 0 - 44 U/L 16 19 14       RADIOGRAPHIC STUDIES: I have personally reviewed the radiological images as listed and agreed with the findings in the report. No results found.    No orders of the defined types were placed in this encounter.  All questions were answered. The patient knows to call the clinic with any problems, questions or concerns. No barriers to learning was detected. The total time spent in the appointment was 30 minutes.     Truitt Merle, MD 07/06/2021   I, Wilburn Mylar, am acting as scribe for Truitt Merle, MD.   I have reviewed the above documentation for accuracy and completeness, and I agree with the above.

## 2021-07-11 ENCOUNTER — Other Ambulatory Visit: Payer: Self-pay

## 2021-07-11 NOTE — Progress Notes (Signed)
The proposed treatment discussed in conference is for discussion purpose only and is not a binding recommendation.  The patients have not been physically examined, or presented with their treatment options.  Therefore, final treatment plans cannot be decided.  

## 2021-07-12 ENCOUNTER — Other Ambulatory Visit: Payer: Self-pay | Admitting: Hematology

## 2021-07-12 DIAGNOSIS — K769 Liver disease, unspecified: Secondary | ICD-10-CM

## 2021-07-12 DIAGNOSIS — C49A2 Gastrointestinal stromal tumor of stomach: Secondary | ICD-10-CM

## 2021-07-12 NOTE — Progress Notes (Signed)
I spoke with Bridget Grant and reviewed the recommendations from GI conference yesterday.  She is agreeable to a MRI of the liver. All questions were answered.  She verbalized understanding.  Order placed per Dr Burr Medico.

## 2021-07-26 ENCOUNTER — Ambulatory Visit (HOSPITAL_COMMUNITY)
Admission: RE | Admit: 2021-07-26 | Discharge: 2021-07-26 | Disposition: A | Discharge status: Home / Self Care | Payer: Medicare Other | Source: Ambulatory Visit | Attending: Hematology | Admitting: Hematology

## 2021-07-26 ENCOUNTER — Other Ambulatory Visit: Payer: Self-pay

## 2021-07-26 DIAGNOSIS — K769 Liver disease, unspecified: Secondary | ICD-10-CM | POA: Diagnosis not present

## 2021-07-26 IMAGING — MR MR ABDOMEN WO/W CM
19 series · 48 of 48 positions shown · IV contrast (gadavist)
Comparison: CT abdomen [DATE]

CLINICAL DATA: Liver lesion evaluation

EXAM:
MRI ABDOMEN WITHOUT AND WITH CONTRAST
TECHNIQUE: Multiplanar multisequence MR imaging of the abdomen was performed
both before and after the administration of intravenous contrast.
CONTRAST:  9mL GADAVIST GADOBUTROL 1 MMOL/ML IV SOLN

[Series 2: DWI · axial · 6.0mm · 1.57mm/px · z∈[-124,+128]mm · 4 of 72 slices shown (1 of 2)]
[im 1/72]
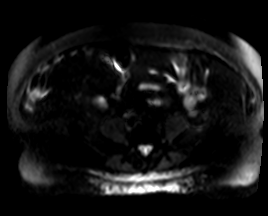
[im 24/72]
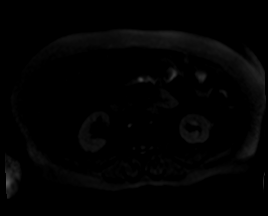
[im 48/72]
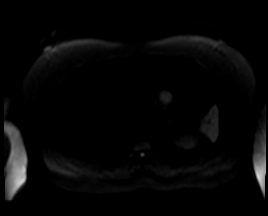
[im 72/72]
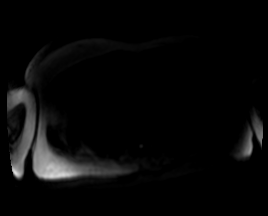

[Series 3: DWI · axial · 6.0mm · 1.57mm/px · 1 of 36 slices shown (2 of 2)]
[im 1/36]
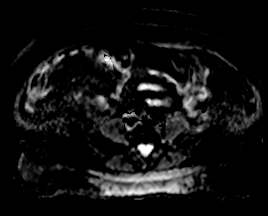

[Series 4: T2 fat-sat · axial · 6.0mm · 1.25mm/px · 1 of 36 slices shown]
[im 1/36]
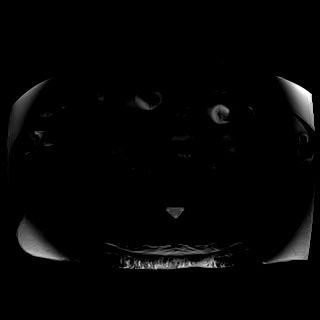

[Series 7: cor haste · coronal · 6.0mm · 1.56mm/px · 1 of 39 slices shown]
[im 1/39]
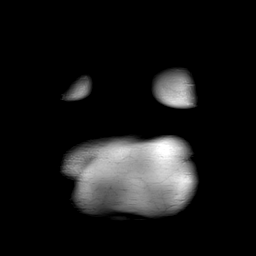

[Series 8: bSSFP · axial · 6.0mm · 2.29mm/px · z∈[-125,+127]mm · 2 of 43 slices shown]
[im 1/43]
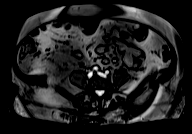
[im 43/43]
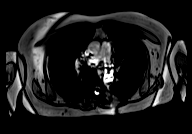

[Series 9: T1 dynamic · axial · 3.0mm · 1.38mm/px · z∈[-130,+131]mm · 3 of 88 slices shown (1 of 10)]
[im 1/88]
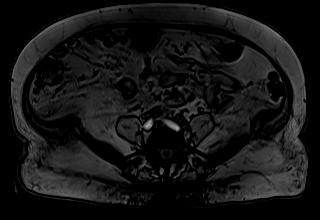
[im 44/88]
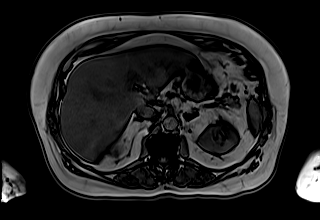
[im 88/88]
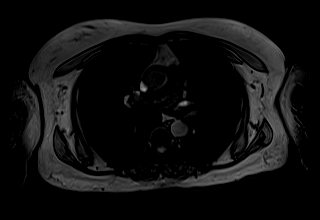

[Series 10: T1 dynamic · axial · 3.0mm · 1.38mm/px · z∈[-130,+131]mm · 3 of 88 slices shown (2 of 10)]
[im 1/88]
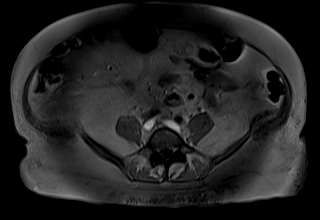
[im 44/88]
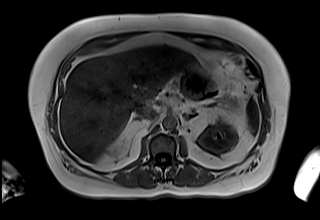
[im 88/88]
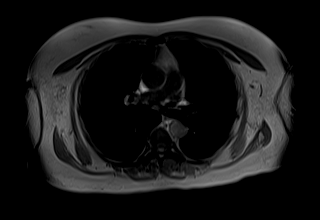

[Series 12: T1 dynamic · axial · 3.0mm · 1.38mm/px · z∈[-130,+131]mm · 3 of 88 slices shown (3 of 10)]
[im 1/88]
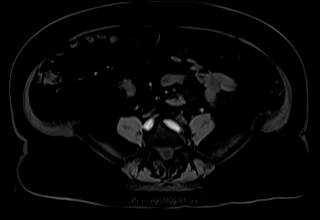
[im 44/88]
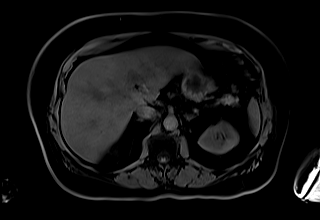
[im 88/88]
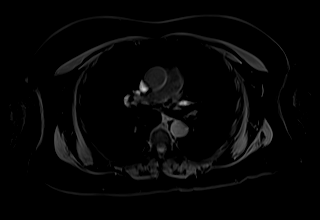

[Series 17: T1 dynamic · axial · 3.0mm · 1.38mm/px · z∈[-130,+131]mm · 3 of 88 slices shown (4 of 10)]
[im 1/88]
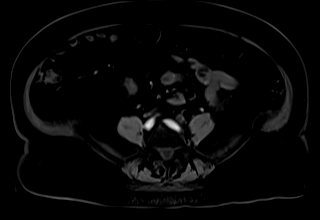
[im 44/88]
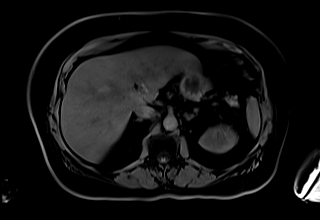
[im 88/88]
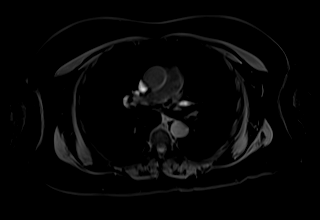

[Series 19: T1 dynamic · axial · 3.0mm · 1.38mm/px · z∈[-130,+131]mm · 3 of 88 slices shown (5 of 10)]
[im 1/88]
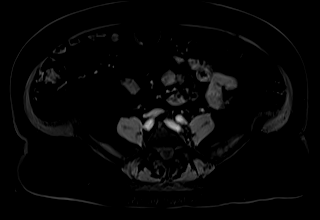
[im 44/88]
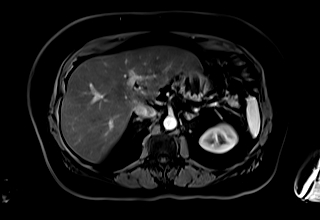
[im 88/88]
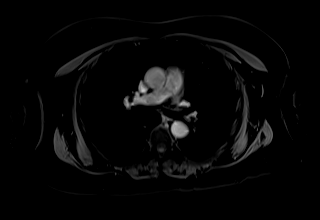

[Series 20: T1 dynamic · axial · 3.0mm · 1.38mm/px · z∈[-130,+131]mm · 3 of 88 slices shown (6 of 10)]
[im 1/88]
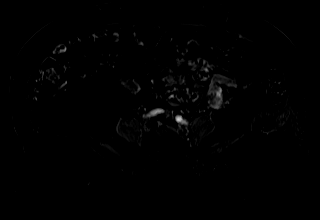
[im 44/88]
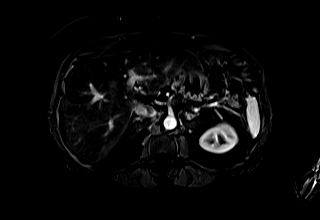
[im 88/88]
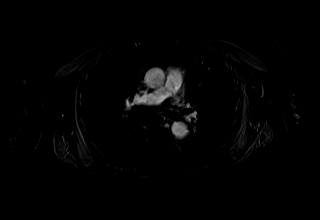

[Series 22: T1 dynamic · axial · 3.0mm · 1.38mm/px · z∈[-130,+131]mm · 3 of 88 slices shown (7 of 10)]
[im 1/88]
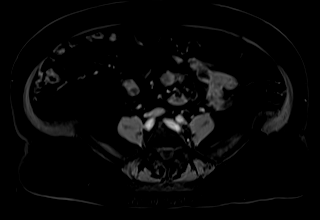
[im 44/88]
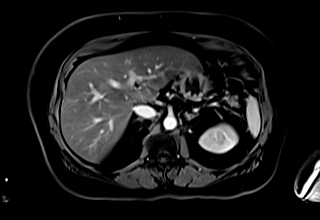
[im 88/88]
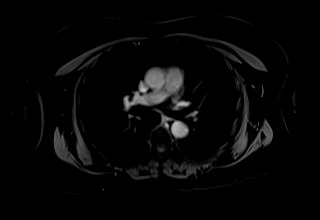

[Series 23: T1 dynamic · axial · 3.0mm · 1.38mm/px · z∈[-130,+131]mm · 3 of 88 slices shown (8 of 10)]
[im 1/88]
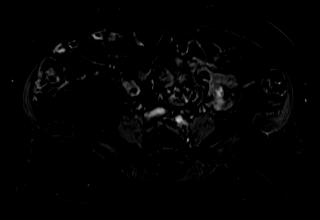
[im 44/88]
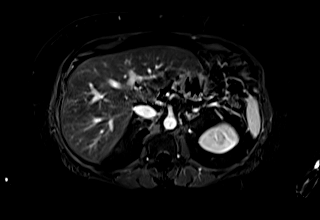
[im 88/88]
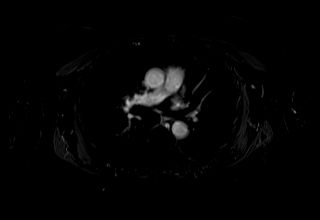

[Series 25: T1 dynamic · axial · 3.0mm · 1.38mm/px · z∈[-130,+131]mm · 3 of 88 slices shown (9 of 10)]
[im 1/88]
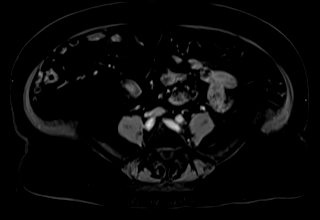
[im 44/88]
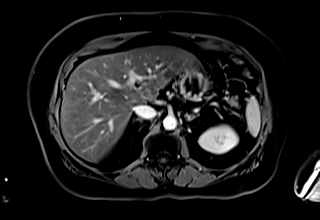
[im 88/88]
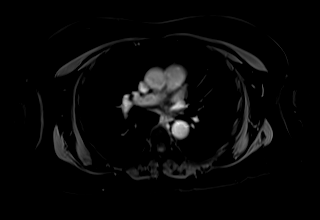

[Series 26: T1 dynamic · axial · 3.0mm · 1.38mm/px · z∈[-130,+131]mm · 3 of 88 slices shown (10 of 10)]
[im 1/88]
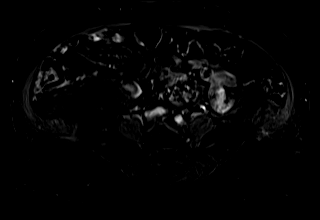
[im 44/88]
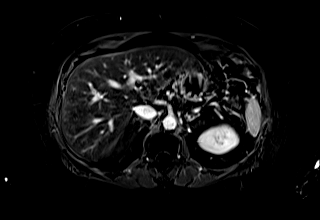
[im 88/88]
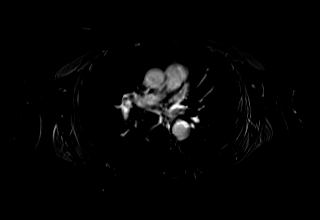

[Series 27: ax_haste_mbh · axial · 6.0mm · 1.38mm/px · 1 of 38 slices shown]
[im 1/38]
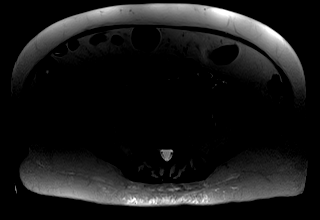

[Series 29: cor_vibe_dixon_delayed_w · coronal · 5.0mm · 1.76mm/px · 2 of 56 slices shown]
[im 1/56]
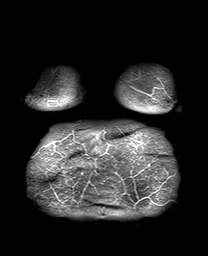
[im 56/56]
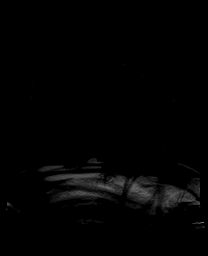

[Series 31: ax_dixon_delayed_w_reg · axial · 3.0mm · 1.38mm/px · z∈[-130,+131]mm · 3 of 88 slices shown]
[im 1/88]
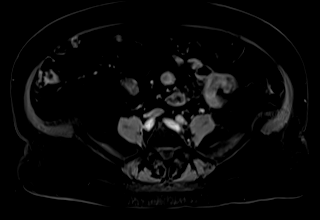
[im 44/88]
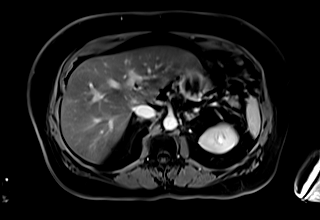
[im 88/88]
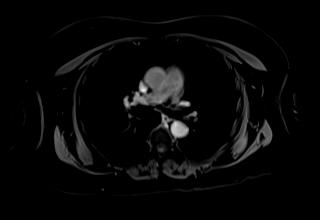

[Series 32: ax_dixon_delayed_w_reg_sub · axial · 3.0mm · 1.38mm/px · z∈[-130,+131]mm · 3 of 88 slices shown]
[im 1/88]
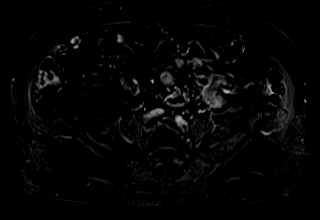
[im 44/88]
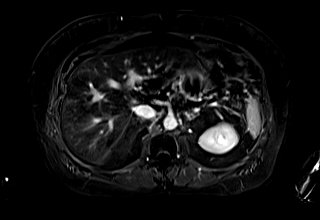
[im 88/88]
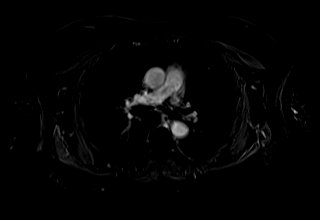

[48 of 48 positions shown; findings below may reference images not displayed]

FINDINGS: Lower chest: No acute findings.

Hepatobiliary: Liver is normal in size and contour. No definitely
suspicious hepatic mass identified. Appears to be an 8 mm
hyperintense T2 signal lesion in the left aspect of the left hepatic
lobe, versus volume averaging. No definite enhancement visualized.
Gallbladder is surgically absent. No biliary ductal dilatation.

Pancreas: No mass, inflammatory changes, or other parenchymal
abnormality identified.

Spleen:  Within normal limits in size and appearance.

Adrenals/Urinary Tract: Adrenal glands appear normal. A few renal
cortical cysts identified bilaterally measuring up to 1.3 cm in the
lower right kidney. No hydronephrosis or enhancing renal mass
visualized bilaterally.

Stomach/Bowel: Postsurgical changes of the stomach. There is a 2.7 x
2.3 cm solid enhancing mass in the proximal/cardia of the stomach
near the suture line as well as a focal enhancing gastric wall mass
at the proximal greater curvature of the stomach measuring 3.6 x
cm. A third questionable nodular mass versus postsurgical change.
Colonic diverticulosis. No evidence of bowel obstruction. Is
visualized at the gastroesophageal junction/distal esophagus
measuring 1.5 x 1.4 cm.

Vascular/Lymphatic: No pathologically enlarged lymph nodes
identified. No abdominal aortic aneurysm demonstrated.

Other:  No ascites.

Musculoskeletal: No suspicious bony lesions identified.
IMPRESSION: 1. Two enhancing gastric wall masses identified as described,
concerning for tumor recurrence. A third enhancing soft tissue
nodularity is visualized at the gastroesophageal junction/within the
distal most esophagus which could represent tumor or postsurgical
change. Consider upper endoscopy.
2. Lesion in the left hepatic lobe seen on prior CT scans is not
well visualized, not definitely enhancing.
3. Other ancillary findings as described.

## 2021-07-26 MED ORDER — GADOBUTROL 1 MMOL/ML IV SOLN
9.0000 mL | Freq: Once | INTRAVENOUS | Status: AC | PRN
  Administered 2021-07-26: 9 mL via INTRAVENOUS

## 2021-07-30 ENCOUNTER — Encounter: Payer: Self-pay | Admitting: Hematology

## 2021-07-30 ENCOUNTER — Telehealth: Payer: Self-pay | Admitting: Hematology

## 2021-07-30 ENCOUNTER — Ambulatory Visit (HOSPITAL_BASED_OUTPATIENT_CLINIC_OR_DEPARTMENT_OTHER): Payer: Medicare Other | Admitting: Hematology

## 2021-07-30 DIAGNOSIS — C49A2 Gastrointestinal stromal tumor of stomach: Secondary | ICD-10-CM

## 2021-07-30 NOTE — Progress Notes (Signed)
[place]   [phone] [phone]   Clinic Follow up Note   Patient Care Team: [name], MD as PCP - Charissa Bash, MD as Consulting Physician (Oncology) Jonnie Finner, RN (Inactive) as Oncology Nurse Navigator Stark Klein, MD as Consulting Physician (General Surgery) Milus Banister, MD as Attending Physician (Gastroenterology)  Date of Service:  07/30/2021  I connected with Bridget Grant on 07/30/2021 at  1:00 PM EST by telephone visit and verified that I am speaking with the correct person using two identifiers.  I discussed the limitations, risks, security and privacy concerns of performing an evaluation and management service by telephone and the availability of in person appointments. I also discussed with the patient that there may be a patient responsible charge related to this service. The patient expressed understanding and agreed to proceed.   Other persons participating in the visit and their role in the encounter:  none  Patient's location:  home Provider's location:  my office  CHIEF COMPLAINT: f/u of GIST  CURRENT THERAPY:  -Gleevec 429m once daily starting 01/27/20 -B12 injection monthly starting 04/14/20. Lost f/u after 11/2020, restart every 6 weeks in 03/2021  ASSESSMENT & PLAN:  Bridget SCHMELZLEis a 76y.o. female with   1. Gastric GIST, ypT3NxM0,  High grade, Kit mutation (+)  -diagnosed in 01/2020 with High grade gastrointestinal stromal tumor. Staging CT CAP from 01/13/20 did not show gastric mass (I think the greater curvature of gastric wall is thickened) but did show incidental findings of left breast mass and 6.5 mm sclerotic lesion at T7.  -Given her bleeding tumor, large size and other work up, I started her on Gleevec 4042mon 01/27/20, prior to surgery.  -She underwent surgery on 02/29/20 with Dr ByBarry DienesSurgical path shows: high-grade GIST spanning 8 cm infiltrating through most of the distal gastric  wall, with a positive distal resection margin, staged pT3; areas of rhabdomyosarcomatous differentiation but is not felt to represent a mixed rhabdomyosarcoma/GIST tumor. Unfortunately, given these features she is at very high risk for recurrence and metastasis.  -She underwent EUS on 04/13/20 which showed no evidence of malignancy or GIST. Although biopsy does not show GIST, Dr JaArdis Hughsas concern for residual disease.  -repeat EUS on 10/12/20 showed significant improvement. -most recent CT AP from 07/02/21 showed no evidence of recurrence.  She has a stable 15 mm liver lesion which has been stable, likely benign. -liver MRI on 07/26/21 showed: two enhancing gastric wall masses, concerning for tumor recurrence; a third enhancing soft tissue nodularity at GE junction/within distal-most esophagus, indeterminate; lesion in left hepatic lobe seen on CT is not well visualized, no definitely enhancing. I reviewed the results with her today. -I recommend she undergo repeat EUS with biopsy. I have already reached out to Dr. JaArdis Hughsnd Dr. ByBarry Dieneso update them. She is agreeable to repeat EUS; Dr. JaEugenia Pancoastffice will contact her to schedule. -[place] now.   2. Anemia secondary to GI bleeding from #1, B12 deficiency  -She was hospitalized on 01/12/20 due to severe anemia with Hg 7.9.  -She was treated with blood transfusion on 01/17/20 and 03/06/20 and IV Feraheme on 01/15/20 and 02/09/20 -she was previously on monthly B12 injections, last 12/05/20.  lost f/u after 11/2020 -B12 level 237 on 03/19/21, will restart at every 6 weeks      PLAN:  -Continue Gleevec 40054mnce daily for now  -EGD and biopsy with  Dr. Ardis Hughs to be done in next few weeks, I have sent a message to Dr. Ardis Hughs  -B12 injection every 6 weeks, next 08/20/21, f/u on 4/28 as scheduled,or sooner if needed    No problem-specific Assessment & Plan notes found for this encounter.   SUMMARY OF ONCOLOGIC HISTORY: Oncology History  Overview Note  Cancer Staging Malignant gastrointestinal stromal tumor (GIST) of stomach (HCC) Staging form: Gastrointestinal Stromal Tumor - Gastric and Omental GIST, AJCC 8th Edition - Pathologic stage from 02/29/2020: pT3, Mitotic Rate: High - Unsigned Histologic grade (G): High grade Histologic grading system: 2 grade system Location of the gastrointestinal stromal tumor: Stomach    Malignant gastrointestinal stromal tumor (GIST) of stomach ([place])  01/13/2020 Procedure   EGD/Upper Endoscopy by Dr Ardis Hughs 01/13/20  IMPRESSION - There was an ulcerated mass along the greater curvature of the stomach, proximal edge was about 5-6cm from the GE junction and the mass was 7-8cm long, 2-3cm wide. It was not circumferental. The mass was friable with spontaneous oozing, contact oozing, ulcerated and heaped up. Some parts of the mass were quite soft whereas other areas were more firm. This was very suspicious for a malignancy and it was biopsied extensively. There was mild distal gastritis, biopsied to check for H. pylori. - The examination was otherwise normal.   01/13/2020 Initial Biopsy   FINAL MICROSCOPIC DIAGNOSIS:  A. STOMACH, BIOPSY:  - High grade gastrointestinal stromal tumor, see comment.   B. STOMACH, DISTAL, BIOPSY:  - Mild reactive gastropathy with erosions.  - Warthin-Starry is negative for Helicobacter pylori.  - No intestinal metaplasia, dysplasia, or malignancy.  COMMENT:  A. The tumor has spindle and epithelioid components and appears high  grade (7 mitosis/20 hpf). Immunohistochemistry is positive for CD117 and  CD34. The cells are negative for cytokeratin 20, cytokeratin 7,  cytokeratin 5/6, CDX-2, SMA, and desmin. The findings are consistent  with a gastrointestinal stromal tumor. Dr. Vic Ripper has reviewed the  case. Dr. Ardis Hughs' nurse was notified on 01/18/2020.     01/13/2020 Imaging   CT CAP W contrast 01/13/20  IMPRESSION: 1. 2.1 cm x 1.1 cm well-defined area of low  attenuation within the soft tissues of the left breast. This may represent a lymph node, however, correlation with mammography is recommended. 2. 6.5 mm sclerotic focus within the posterior aspect of the T7 vertebral body. This may represent a bone island, however, correlation with follow-up abdomen pelvis CT is recommended to exclude the presence of a metastatic lesion. 3. Small hiatal hernia. 4. 2.6 cm x 2.0 cm fat containing umbilical hernia. 5. Aortic atherosclerosis.    01/23/2020 Initial Diagnosis   Malignant gastrointestinal stromal tumor (GIST) of stomach ([place])   01/26/2020 -  Chemotherapy   Gleevec 463m once daily starting 01/26/20    02/11/2020 PET scan   IMPRESSION: 1. Focal mass along the stomach fundus with maximum SUV 15.7, compatible with malignancy. No findings of metastatic spread. 2. Mildly increased sub solid nodularity in the left upper lobe with only very subtle associated activity, likely inflammatory. Incidental note is made of some secondary pulmonary lobular septal thickening in the lung apices, and mild interstitial edema is not excluded given the underlying cardiomegaly. 3. Other imaging findings of potential clinical significance: Aortic Atherosclerosis (ICD10-I70.0). Coronary atherosclerosis. Sigmoid colon diverticulosis. Small umbilical hernia contains adipose tissue. Physiologic activity along the costovertebral joints.   02/29/2020 Surgery   Partial gastrectomy by Dr. BBarry Dienes   02/29/2020 Pathology Results   FINAL MICROSCOPIC DIAGNOSIS:  A. STOMACH,  PARTIAL GASTRECTOMY:  - High grade gastrointestinal stromal tumor with dedifferentiation,  spanning approximately 8 cm  - Tumor infiltrates through most of the distal gastric wall and involves  the distal resection margin  - See oncology table  - See comment  Immunohistochemical stains show that the tumor cells are positive for  CD117, CD34 and SMA (patchy).  A component of the tumor shows very   high-grade features with loss of CD117 and CD34 expression, consistent  with dedifferentiation.  Tumor also shows areas with  rhabdomyosarcomatous differentiation with presence of desmin expression.    04/13/2020 Procedure   EUS  by Dr Ardis Hughs  IMPRESSION - The staple line from recent partial gastrectomy was noted in the proximal stomach, runs longitudinally along the gastric fundus. The mucosa immediately around the stable line was normal appearing however in the more distal gastric body (anterior wall likely) the mucosa was quite irregular appearing; friable, firm, slightly ulcerated for 4-5cm at least. This was not a discrete tumor, and these changes were about 5cm from the pylorus. I sampled this region extensively with forceps following EUS evaluation below.- The muscularis propria was abnormally thickened starting around the staple line and continuing throughout most of the stomach distally. This thickening was not circumferential.     FINAL MICROSCOPIC DIAGNOSIS:   A. GASTRIC BODY, BIOPSY:  - Gastric mucosa with changes consistent with reactive gastropathy.  - No spindle cell lesion, intestinal metaplasia, dysplasia or carcinoma.     10/12/2020 Procedure   EUS by Dr. Ardis Hughs  Impression: - The overall endoscopic appearance of the stomach, s/p wedge resection was [facility] from 04/2020 EGD/EUS. The wedge 'anastomosis' was normal appearing. The previously ulcerated mucosa was normal now. There was very mild, non-specific pangastritis. Given the significantly improved appearance, no biopsies were taken. EUS evaluation reveals expected anatomically abnormal findings at the wedge resection site (related to post operative changes) but no significanly thickened gastric layers or masses.   07/02/2021 Imaging   EXAM: CT CHEST, ABDOMEN, AND PELVIS WITH CONTRAST  IMPRESSION: 1. Stable exam. No new or progressive findings to suggest recurrent or metastatic disease. 2. Stable 15 mm  hypoattenuating lesion in the lateral segment left liver, likely benign. Continued attention on follow-up recommended. 3. Small hiatal hernia. 4. Left colonic diverticulosis without diverticulitis. 5. Aortic Atherosclerosis (ICD10-I70.0).   07/26/2021 Imaging   EXAM: MRI ABDOMEN WITHOUT AND WITH CONTRAST  IMPRESSION: 1. Two enhancing gastric wall masses identified as described, concerning for tumor recurrence. A third enhancing soft tissue nodularity is visualized at the gastroesophageal junction/within the distal most esophagus which could represent tumor or postsurgical change. Consider upper endoscopy. 2. Lesion in the left hepatic lobe seen on prior CT scans is not well visualized, not definitely enhancing. 3. Other ancillary findings as described.      INTERVAL HISTORY:  Bridget Grant was contacted for a follow up of GIST. She was last seen by me on 07/06/21.  She reports she is doing well overall, no new concerns since her last visit.   All other systems were reviewed with the patient and are negative.  MEDICAL HISTORY:  Past Medical History:  Diagnosis Date   Anxiety    Cancer ([place]) 2021   stomach   Complication of anesthesia    Depression    Hyperlipidemia    Hypertension    Migraine    Osteopenia    PONV (postoperative nausea and vomiting)    Type II or unspecified type diabetes mellitus without mention of complication,  uncontrolled 04/09/2013   Upper GI bleed 01/13/2020    SURGICAL HISTORY: Past Surgical History:  Procedure Laterality Date   ABDOMINAL HYSTERECTOMY     APPENDECTOMY     BIOPSY  01/13/2020   Procedure: BIOPSY;  Surgeon: Milus Banister, MD;  Location: [facility] ENDOSCOPY;  Service: Endoscopy;;   BIOPSY  04/13/2020   Procedure: BIOPSY;  Surgeon: Milus Banister, MD;  Location: [facility] ENDOSCOPY;  Service: Endoscopy;;   CHOLECYSTECTOMY     ESOPHAGOGASTRODUODENOSCOPY (EGD) WITH PROPOFOL N/A 01/13/2020   Procedure: ESOPHAGOGASTRODUODENOSCOPY (EGD) WITH  PROPOFOL;  Surgeon: Milus Banister, MD;  Location[facility] ENDOSCOPY;  Service: Endoscopy;  Laterality: N/A;   ESOPHAGOGASTRODUODENOSCOPY (EGD) WITH PROPOFOL N/A 04/13/2020   Procedure: ESOPHAGOGASTRODUODENOSCOPY (EGD) WITH PROPOFOL;  Surgeon: Milus Banister, MD;  Location: [facility] ENDOSCOPY;  Service: Endoscopy;  Laterality: N/A;   ESOPHAGOGASTRODUODENOSCOPY (EGD) WITH PROPOFOL N/A 10/12/2020   Procedure: ESOPHAGOGASTRODUODENOSCOPY (EGD) WITH PROPOFOL;  Surgeon: Milus Banister, MD;  Location: [facility] ENDOSCOPY;  Service: Endoscopy;  Laterality: N/A;   EUS N/A 04/13/2020   Procedure: UPPER ENDOSCOPIC ULTRASOUND (EUS) LINEAR;  Surgeon: Milus Banister, MD;  Location: [facility] ENDOSCOPY;  Service: Endoscopy;  Laterality: N/A;   EUS N/A 10/12/2020   Procedure: UPPER ENDOSCOPIC ULTRASOUND (EUS) RADIAL;  Surgeon: Milus Banister, MD;  Location: [facility] ENDOSCOPY;  Service: Endoscopy;  Laterality: N/A;   PARTIAL GASTRECTOMY  02/28/2020    I have reviewed the social history and family history with the patient and they are unchanged from previous note.  ALLERGIES:  has No Known Allergies.  MEDICATIONS:  Current Outpatient Medications  Medication Sig Dispense Refill   acetaminophen (TYLENOL) 500 MG tablet Take 1,000 mg by mouth every 6 (six) hours as needed. (Patient not taking: Reported on 05/17/2021)     atorvastatin (LIPITOR) 20 MG tablet TAKE 1 TABLET BY MOUTH EVERY DAY 90 tablet 3   diltiazem (CARDIZEM CD) 240 MG 24 hr capsule TAKE 1 CAPSULE(240 MG) BY MOUTH DAILY 90 capsule 3   gabapentin (NEURONTIN) 100 MG capsule 1-2 tab by mouth three times per day as needed for pain (Patient not taking: Reported on 05/17/2021) 180 capsule 5   hydrochlorothiazide (MICROZIDE) 12.5 MG capsule Take 1 capsule (12.5 mg total) by mouth daily. 90 capsule 3   imatinib (GLEEVEC) 400 MG tablet TAKE 1 TABLET BY MOUTH 1 TIME A DAY. 30 tablet 2   lisinopril (ZESTRIL) 20 MG tablet Take 0.5 tablets (10 mg total) by mouth daily. 45 tablet 3    metFORMIN (GLUCOPHAGE-XR) 500 MG 24 hr tablet Take 2 tablets (1,000 mg total) by mouth daily with breakfast. 180 tablet 3   ondansetron (ZOFRAN ODT) 8 MG disintegrating tablet Take 1 tablet (8 mg total) by mouth every 8 (eight) hours as needed for nausea or vomiting. 30 tablet 1   OVER THE COUNTER MEDICATION Take 1 capsule by mouth daily. Super Beets otc supplement     pantoprazole (PROTONIX) 40 MG tablet Take 1 tablet (40 mg total) by mouth 2 (two) times daily before a meal. TAKE 1 TABLET(40 MG) BY MOUTH TWICE DAILY BEFORE A MEAL 180 tablet 3   Prenatal Vit-Fe Fumarate-FA (PRENATAL PO) Take 3 tablets by mouth daily.     trolamine salicylate (ASPERCREME) 10 % cream Apply 1 application topically as needed for muscle pain.     No current facility-administered medications for this visit.    PHYSICAL EXAMINATION: ECOG PERFORMANCE STATUS: 0 - Asymptomatic  There were no vitals filed for this visit. Wt Readings from  Last 3 Encounters:  07/06/21 195 lb 3 oz (88.5 kg)  05/17/21 186 lb (84.4 kg)  03/19/21 181 lb (82.1 kg)     No vitals taken today, Exam not performed today  LABORATORY DATA:  I have reviewed the data as listed CBC Latest Ref Rng & Units 07/02/2021 03/19/2021 11/15/2020  WBC 4.0 - 10.5 K/uL 6.3 7.0 7.9  Hemoglobin 12.0 - 15.0 g/dL 11.1(L) 11.5(L) 12.6  Hematocrit 36.0 - 46.0 % 33.2(L) 32.8(L) 37.2  Platelets 150 - 400 K/uL 232 226 256.0     CMP Latest Ref Rng & Units 07/02/2021 03/19/2021 11/15/2020  Glucose 70 - 99 mg/dL 115(H) 109(H) 112(H)  BUN 8 - 23 mg/dL 12 12 17   Creatinine 0.44 - 1.00 mg/dL 0.86 0.93 0.81  Sodium 135 - 145 mmol/L 142 142 140  Potassium 3.5 - 5.1 mmol/L 4.0 3.6 4.0  Chloride 98 - 111 mmol/L 103 105 101  CO2 22 - 32 mmol/L 30 25 31   Calcium 8.9 - 10.3 mg/dL 9.5 9.4 9.7  Total Protein 6.5 - 8.1 g/dL 7.0 7.4 7.5  Total Bilirubin 0.3 - 1.2 mg/dL 0.7 0.5 0.4  Alkaline Phos 38 - 126 U/L 64 72 59  AST 15 - 41 U/L 18 22 17   ALT 0 - 44 U/L 16 19 14        RADIOGRAPHIC STUDIES: I have personally reviewed the radiological images as listed and agreed with the findings in the report. No results found.    No orders of the defined types were placed in this encounter.  All questions were answered. The patient knows to call the clinic with any problems, questions or concerns. No barriers to learning was detected. The total time spent in the appointment was 22 minutes.     Truitt Merle, MD 07/30/2021   I, Wilburn Mylar, am acting as scribe for Truitt Merle, MD.   I have reviewed the above documentation for accuracy and completeness, and I agree with the above.

## 2021-07-30 NOTE — Telephone Encounter (Signed)
Scheduled follow-up appointment per 2/18 schedule message. Patient is aware.

## 2021-07-30 NOTE — H&P (View-Only) (Signed)
Bridget Grant   Telephone:([phone] Fax:([phone]) [phone]   Clinic Follow up Note   Patient Care Team: [name], MD as PCP - Charissa Bash, MD as Consulting Physician (Oncology) Jonnie Finner, RN (Inactive) as Oncology Nurse Navigator Stark Klein, MD as Consulting Physician (General Surgery) Milus Banister, MD as Attending Physician (Gastroenterology)  Date of Service:  07/30/2021  I connected with [name] on 07/30/2021 at  1:00 PM EST by telephone visit and verified that I am speaking with the correct person using two identifiers.  I discussed the limitations, risks, security and privacy concerns of performing an evaluation and management service by telephone and the availability of in person appointments. I also discussed with the patient that there may be a patient responsible charge related to this service. The patient expressed understanding and agreed to proceed.   Other persons participating in the visit and their role in the encounter:  none  Patient's location:  home Provider's location:  my office  CHIEF COMPLAINT: f/u of GIST  CURRENT THERAPY:  -Gleevec 429m once daily starting 01/27/20 -B12 injection monthly starting 04/14/20. Lost f/u after 11/2020, restart every 6 weeks in 03/2021  ASSESSMENT & PLAN:  Bridget DIGERONIMOis a 76y.o. female with   1. Gastric GIST, ypT3NxM0,  High grade, Kit mutation (+)  -diagnosed in 01/2020 with High grade gastrointestinal stromal tumor. Staging CT CAP from 01/13/20 did not show gastric mass (I think the greater curvature of gastric wall is thickened) but did show incidental findings of left breast mass and 6.5 mm sclerotic lesion at T7.  -Given her bleeding tumor, large size and other work up, I started her on Gleevec 4061mon 01/27/20, prior to surgery.  -She underwent surgery on 02/29/20 with Dr ByBarry DienesSurgical path shows: high-grade GIST spanning 8 cm infiltrating through most of the distal gastric  wall, with a positive distal resection margin, staged pT3; areas of rhabdomyosarcomatous differentiation but is not felt to represent a mixed rhabdomyosarcoma/GIST tumor. Unfortunately, given these features she is at very high risk for recurrence and metastasis.  -She underwent EUS on 04/13/20 which showed no evidence of malignancy or GIST. Although biopsy does not show GIST, Dr JaArdis Hughsas concern for residual disease.  -repeat EUS on 10/12/20 showed significant improvement. -most recent CT AP from 07/02/21 showed no evidence of recurrence.  She has a stable 15 mm liver lesion which has been stable, likely benign. -liver MRI on 07/26/21 showed: two enhancing gastric wall masses, concerning for tumor recurrence; a third enhancing soft tissue nodularity at GE junction/within distal-most esophagus, indeterminate; lesion in left hepatic lobe seen on CT is not well visualized, no definitely enhancing. I reviewed the results with her today. -I recommend she undergo repeat EUS with biopsy. I have already reached out to Dr. JaArdis Hughsnd Dr. ByBarry Dieneso update them. She is agreeable to repeat EUS; Dr. JaEugenia Pancoastffice will contact her to schedule. -she will continue GlLehighor now.   2. Anemia secondary to GI bleeding from #1, B12 deficiency  -She was hospitalized on 01/12/20 due to severe anemia with Hg 7.9.  -She was treated with blood transfusion on 01/17/20 and 03/06/20 and IV Feraheme on 01/15/20 and 02/09/20 -she was previously on monthly B12 injections, last 12/05/20.  lost f/u after 11/2020 -B12 level 237 on 03/19/21, will restart at every 6 weeks      PLAN:  -Continue Gleevec 40021mnce daily for now  -EGD and biopsy with  Dr. Ardis Hughs to be done in next few weeks, I have sent a message to Dr. Ardis Hughs  -B12 injection every 6 weeks, next 08/20/21, f/u on 4/28 as scheduled,or sooner if needed    No problem-specific Assessment & Plan notes found for this encounter.   SUMMARY OF ONCOLOGIC HISTORY: Oncology History  Overview Note  Cancer Staging Malignant gastrointestinal stromal tumor (GIST) of stomach (HCC) Staging form: Gastrointestinal Stromal Tumor - Gastric and Omental GIST, AJCC 8th Edition - Pathologic stage from 02/29/2020: pT3, Mitotic Rate: High - Unsigned Histologic grade (G): High grade Histologic grading system: 2 grade system Location of the gastrointestinal stromal tumor: Stomach    Malignant gastrointestinal stromal tumor (GIST) of stomach ([place])  01/13/2020 Procedure   EGD/Upper Endoscopy by Dr Ardis Hughs 01/13/20  IMPRESSION - There was an ulcerated mass along the greater curvature of the stomach, proximal edge was about 5-6cm from the GE junction and the mass was 7-8cm long, 2-3cm wide. It was not circumferental. The mass was friable with spontaneous oozing, contact oozing, ulcerated and heaped up. Some parts of the mass were quite soft whereas other areas were more firm. This was very suspicious for a malignancy and it was biopsied extensively. There was mild distal gastritis, biopsied to check for H. pylori. - The examination was otherwise normal.   01/13/2020 Initial Biopsy   FINAL MICROSCOPIC DIAGNOSIS:  A. STOMACH, BIOPSY:  - High grade gastrointestinal stromal tumor, see comment.   B. STOMACH, DISTAL, BIOPSY:  - Mild reactive gastropathy with erosions.  - Warthin-Starry is negative for Helicobacter pylori.  - No intestinal metaplasia, dysplasia, or malignancy.  COMMENT:  A. The tumor has spindle and epithelioid components and appears high  grade (7 mitosis/20 hpf). Immunohistochemistry is positive for CD117 and  CD34. The cells are negative for cytokeratin 20, cytokeratin 7,  cytokeratin 5/6, CDX-2, SMA, and desmin. The findings are consistent  with a gastrointestinal stromal tumor. Dr. Vic Ripper has reviewed the  case. Dr. Ardis Hughs' nurse was notified on 01/18/2020.     01/13/2020 Imaging   CT CAP W contrast 01/13/20  IMPRESSION: 1. 2.1 cm x 1.1 cm well-defined area of low  attenuation within the soft tissues of the left breast. This may represent a lymph node, however, correlation with mammography is recommended. 2. 6.5 mm sclerotic focus within the posterior aspect of the T7 vertebral body. This may represent a bone island, however, correlation with follow-up abdomen pelvis CT is recommended to exclude the presence of a metastatic lesion. 3. Small hiatal hernia. 4. 2.6 cm x 2.0 cm fat containing umbilical hernia. 5. Aortic atherosclerosis.    01/23/2020 Initial Diagnosis   Malignant gastrointestinal stromal tumor (GIST) of stomach ([place])   01/26/2020 -  Chemotherapy   Gleevec 430m once daily starting 01/26/20    02/11/2020 PET scan   IMPRESSION: 1. Focal mass along the stomach fundus with maximum SUV 15.7, compatible with malignancy. No findings of metastatic spread. 2. Mildly increased sub solid nodularity in the left upper lobe with only very subtle associated activity, likely inflammatory. Incidental note is made of some secondary pulmonary lobular septal thickening in the lung apices, and mild interstitial edema is not excluded given the underlying cardiomegaly. 3. Other imaging findings of potential clinical significance: Aortic Atherosclerosis (ICD10-I70.0). Coronary atherosclerosis. Sigmoid colon diverticulosis. Small umbilical hernia contains adipose tissue. Physiologic activity along the costovertebral joints.   02/29/2020 Surgery   Partial gastrectomy by Dr. BBarry Dienes   02/29/2020 Pathology Results   FINAL MICROSCOPIC DIAGNOSIS:  A. STOMACH,  PARTIAL GASTRECTOMY:  - High grade gastrointestinal stromal tumor with dedifferentiation,  spanning approximately 8 cm  - Tumor infiltrates through most of the distal gastric wall and involves  the distal resection margin  - See oncology table  - See comment  Immunohistochemical stains show that the tumor cells are positive for  CD117, CD34 and SMA (patchy).  A component of the tumor shows very   high-grade features with loss of CD117 and CD34 expression, consistent  with dedifferentiation.  Tumor also shows areas with  rhabdomyosarcomatous differentiation with presence of desmin expression.    04/13/2020 Procedure   EUS  by Dr Ardis Hughs  IMPRESSION - The staple line from recent partial gastrectomy was noted in the proximal stomach, runs longitudinally along the gastric fundus. The mucosa immediately around the stable line was normal appearing however in the more distal gastric body (anterior wall likely) the mucosa was quite irregular appearing; friable, firm, slightly ulcerated for 4-5cm at least. This was not a discrete tumor, and these changes were about 5cm from the pylorus. I sampled this region extensively with forceps following EUS evaluation below.- The muscularis propria was abnormally thickened starting around the staple line and continuing throughout most of the stomach distally. This thickening was not circumferential.     FINAL MICROSCOPIC DIAGNOSIS:   A. GASTRIC BODY, BIOPSY:  - Gastric mucosa with changes consistent with reactive gastropathy.  - No spindle cell lesion, intestinal metaplasia, dysplasia or carcinoma.     10/12/2020 Procedure   EUS by Dr. Ardis Hughs  Impression: - The overall endoscopic appearance of the stomach, s/p wedge resection was [facility] IMPROVED from 04/2020 EGD/EUS. The wedge 'anastomosis' was normal appearing. The previously ulcerated mucosa was normal now. There was very mild, non-specific pangastritis. Given the significantly improved appearance, no biopsies were taken. EUS evaluation reveals expected anatomically abnormal findings at the wedge resection site (related to post operative changes) but no significanly thickened gastric layers or masses.   07/02/2021 Imaging   EXAM: CT CHEST, ABDOMEN, AND PELVIS WITH CONTRAST  IMPRESSION: 1. Stable exam. No new or progressive findings to suggest recurrent or metastatic disease. 2. Stable 15 mm  hypoattenuating lesion in the lateral segment left liver, likely benign. Continued attention on follow-up recommended. 3. Small hiatal hernia. 4. Left colonic diverticulosis without diverticulitis. 5. Aortic Atherosclerosis (ICD10-I70.0).   07/26/2021 Imaging   EXAM: MRI ABDOMEN WITHOUT AND WITH CONTRAST  IMPRESSION: 1. Two enhancing gastric wall masses identified as described, concerning for tumor recurrence. A third enhancing soft tissue nodularity is visualized at the gastroesophageal junction/within the distal most esophagus which could represent tumor or postsurgical change. Consider upper endoscopy. 2. Lesion in the left hepatic lobe seen on prior CT scans is not well visualized, not definitely enhancing. 3. Other ancillary findings as described.      INTERVAL HISTORY:  STORM DULSKI was contacted for a follow up of GIST. She was last seen by me on 07/06/21.  She reports she is doing well overall, no new concerns since her last visit.   All other systems were reviewed with the patient and are negative.  MEDICAL HISTORY:  Past Medical History:  Diagnosis Date   Anxiety    Cancer ([place]) 2021   stomach   Complication of anesthesia    Depression    Hyperlipidemia    Hypertension    Migraine    Osteopenia    PONV (postoperative nausea and vomiting)    Type II or unspecified type diabetes mellitus without mention of complication,  uncontrolled 04/09/2013   Upper GI bleed 01/13/2020    SURGICAL HISTORY: Past Surgical History:  Procedure Laterality Date   ABDOMINAL HYSTERECTOMY     APPENDECTOMY     BIOPSY  01/13/2020   Procedure: BIOPSY;  Surgeon: Milus Banister, MD;  Location: [facility] ENDOSCOPY;  Service: Endoscopy;;   BIOPSY  04/13/2020   Procedure: BIOPSY;  Surgeon: Milus Banister, MD;  Location: [facility] ENDOSCOPY;  Service: Endoscopy;;   CHOLECYSTECTOMY     ESOPHAGOGASTRODUODENOSCOPY (EGD) WITH PROPOFOL N/A 01/13/2020   Procedure: ESOPHAGOGASTRODUODENOSCOPY (EGD) WITH  PROPOFOL;  Surgeon: Milus Banister, MD;  Location[facility] ENDOSCOPY;  Service: Endoscopy;  Laterality: N/A;   ESOPHAGOGASTRODUODENOSCOPY (EGD) WITH PROPOFOL N/A 04/13/2020   Procedure: ESOPHAGOGASTRODUODENOSCOPY (EGD) WITH PROPOFOL;  Surgeon: Milus Banister, MD;  Location: [facility] ENDOSCOPY;  Service: Endoscopy;  Laterality: N/A;   ESOPHAGOGASTRODUODENOSCOPY (EGD) WITH PROPOFOL N/A 10/12/2020   Procedure: ESOPHAGOGASTRODUODENOSCOPY (EGD) WITH PROPOFOL;  Surgeon: Milus Banister, MD;  Location: [facility] ENDOSCOPY;  Service: Endoscopy;  Laterality: N/A;   EUS N/A 04/13/2020   Procedure: UPPER ENDOSCOPIC ULTRASOUND (EUS) LINEAR;  Surgeon: Milus Banister, MD;  Location: [facility] ENDOSCOPY;  Service: Endoscopy;  Laterality: N/A;   EUS N/A 10/12/2020   Procedure: UPPER ENDOSCOPIC ULTRASOUND (EUS) RADIAL;  Surgeon: Milus Banister, MD;  Location: [facility] ENDOSCOPY;  Service: Endoscopy;  Laterality: N/A;   PARTIAL GASTRECTOMY  02/28/2020    I have reviewed the social history and family history with the patient and they are unchanged from previous note.  ALLERGIES:  has No Known Allergies.  MEDICATIONS:  Current Outpatient Medications  Medication Sig Dispense Refill   acetaminophen (TYLENOL) 500 MG tablet Take 1,000 mg by mouth every 6 (six) hours as needed. (Patient not taking: Reported on 05/17/2021)     atorvastatin (LIPITOR) 20 MG tablet TAKE 1 TABLET BY MOUTH EVERY DAY 90 tablet 3   diltiazem (CARDIZEM CD) 240 MG 24 hr capsule TAKE 1 CAPSULE(240 MG) BY MOUTH DAILY 90 capsule 3   gabapentin (NEURONTIN) 100 MG capsule 1-2 tab by mouth three times per day as needed for pain (Patient not taking: Reported on 05/17/2021) 180 capsule 5   hydrochlorothiazide (MICROZIDE) 12.5 MG capsule Take 1 capsule (12.5 mg total) by mouth daily. 90 capsule 3   imatinib (GLEEVEC) 400 MG tablet TAKE 1 TABLET BY MOUTH 1 TIME A DAY. 30 tablet 2   lisinopril (ZESTRIL) 20 MG tablet Take 0.5 tablets (10 mg total) by mouth daily. 45 tablet 3    metFORMIN (GLUCOPHAGE-XR) 500 MG 24 hr tablet Take 2 tablets (1,000 mg total) by mouth daily with breakfast. 180 tablet 3   ondansetron (ZOFRAN ODT) 8 MG disintegrating tablet Take 1 tablet (8 mg total) by mouth every 8 (eight) hours as needed for nausea or vomiting. 30 tablet 1   OVER THE COUNTER MEDICATION Take 1 capsule by mouth daily. Super Beets otc supplement     pantoprazole (PROTONIX) 40 MG tablet Take 1 tablet (40 mg total) by mouth 2 (two) times daily before a meal. TAKE 1 TABLET(40 MG) BY MOUTH TWICE DAILY BEFORE A MEAL 180 tablet 3   Prenatal Vit-Fe Fumarate-FA (PRENATAL PO) Take 3 tablets by mouth daily.     trolamine salicylate (ASPERCREME) 10 % cream Apply 1 application topically as needed for muscle pain.     No current facility-administered medications for this visit.    PHYSICAL EXAMINATION: ECOG PERFORMANCE STATUS: 0 - Asymptomatic  There were no vitals filed for this visit. Wt Readings from  Last 3 Encounters:  07/06/21 195 lb 3 oz (88.5 kg)  05/17/21 186 lb (84.4 kg)  03/19/21 181 lb (82.1 kg)     No vitals taken today, Exam not performed today  LABORATORY DATA:  I have reviewed the data as listed CBC Latest Ref Rng & Units 07/02/2021 03/19/2021 11/15/2020  WBC 4.0 - 10.5 K/uL 6.3 7.0 7.9  Hemoglobin 12.0 - 15.0 g/dL 11.1(L) 11.5(L) 12.6  Hematocrit 36.0 - 46.0 % 33.2(L) 32.8(L) 37.2  Platelets 150 - 400 K/uL 232 226 256.0     CMP Latest Ref Rng & Units 07/02/2021 03/19/2021 11/15/2020  Glucose 70 - 99 mg/dL 115(H) 109(H) 112(H)  BUN 8 - 23 mg/dL 12 12 17   Creatinine 0.44 - 1.00 mg/dL 0.86 0.93 0.81  Sodium 135 - 145 mmol/L 142 142 140  Potassium 3.5 - 5.1 mmol/L 4.0 3.6 4.0  Chloride 98 - 111 mmol/L 103 105 101  CO2 22 - 32 mmol/L 30 25 31   Calcium 8.9 - 10.3 mg/dL 9.5 9.4 9.7  Total Protein 6.5 - 8.1 g/dL 7.0 7.4 7.5  Total Bilirubin 0.3 - 1.2 mg/dL 0.7 0.5 0.4  Alkaline Phos 38 - 126 U/L 64 72 59  AST 15 - 41 U/L 18 22 17   ALT 0 - 44 U/L 16 19 14        RADIOGRAPHIC STUDIES: I have personally reviewed the radiological images as listed and agreed with the findings in the report. No results found.    No orders of the defined types were placed in this encounter.  All questions were answered. The patient knows to call the clinic with any problems, questions or concerns. No barriers to learning was detected. The total time spent in the appointment was 22 minutes.     Truitt Merle, MD 07/30/2021   I, Wilburn Mylar, am acting as scribe for Truitt Merle, MD.   I have reviewed the above documentation for accuracy and completeness, and I agree with the above.

## 2021-07-31 ENCOUNTER — Other Ambulatory Visit: Payer: Self-pay

## 2021-07-31 ENCOUNTER — Telehealth: Payer: Self-pay

## 2021-07-31 DIAGNOSIS — C49A0 Gastrointestinal stromal tumor, unspecified site: Secondary | ICD-10-CM

## 2021-07-31 NOTE — Telephone Encounter (Signed)
-----   Message from Milus Banister, MD sent at 07/31/2021  5:49 AM EST ----- I agree, will get her in quickly for relook in her stomach.  Rumaisa Schnetzer, Can you see if endoscopy can support EUS add on to the end of my schedule this Thursday (23rd) or next Thursday.  Let me know.  Dx; gastric GIST.  Thanks  DJ ----- Message ----- From: Stark Klein, MD Sent: 07/30/2021  12:28 PM EST To: Milus Banister, MD, Truitt Merle, MD  This is a hard one.  I think EUS is a good idea.  The distal margin was the one that was positive, but the tumor was very high.  It looks like the stomach is a bit in the chest since I can see the staple line going above the diaphragm.  She is also the one where the whole gastric wall was a bit thick.    We'll see what the EUS shows.  If the "nodularity" at the GE junction is positive and the other two masses are there, would probably require total gastrectomy with potential for thoracic component.  Fb   ----- Message ----- From: Truitt Merle, MD Sent: 07/28/2021   3:56 PM EST To: Milus Banister, MD, Stark Klein, MD  Salley Scarlet Bari Mantis,  Her restaging CT in Jan 2023 was negative, now she has 2-3 recurrent tumor in stomach, last EGD was in 10/2020. I plan to see her back on Monday or Tuesday, and recommend repeating EGD first. Dorris Fetch, let me know if you would offer surgery, thanks   Krista Blue  ----- Message ----- From: Buel Ream, Rad Results In Sent: 07/27/2021   3:28 PM EST To: Truitt Merle, MD

## 2021-07-31 NOTE — Telephone Encounter (Signed)
EUS scheduled, pt instructed and medications reviewed.  Patient instructions mailed to home and sent to My Chart .  Patient to call with any questions or concerns.  

## 2021-07-31 NOTE — Telephone Encounter (Signed)
The EUS can't be added to 2/23 due to the block being inpatient only.  EUS scheduled on 08/09/21 at 1145 am at [facility] with DJ.    Left message on machine to call back

## 2021-08-01 ENCOUNTER — Encounter (HOSPITAL_COMMUNITY): Payer: Self-pay | Admitting: Gastroenterology

## 2021-08-08 ENCOUNTER — Other Ambulatory Visit (HOSPITAL_COMMUNITY): Payer: Self-pay

## 2021-08-09 ENCOUNTER — Encounter (HOSPITAL_COMMUNITY)
Admission: RE | Disposition: A | Discharge status: Home / Self Care | Payer: Self-pay | Source: Home / Self Care | Attending: Gastroenterology

## 2021-08-09 ENCOUNTER — Other Ambulatory Visit: Payer: Self-pay

## 2021-08-09 ENCOUNTER — Ambulatory Visit (HOSPITAL_BASED_OUTPATIENT_CLINIC_OR_DEPARTMENT_OTHER): Payer: Medicare Other | Admitting: Certified Registered Nurse Anesthetist

## 2021-08-09 ENCOUNTER — Encounter (HOSPITAL_COMMUNITY): Payer: Self-pay | Admitting: Gastroenterology

## 2021-08-09 ENCOUNTER — Ambulatory Visit (HOSPITAL_COMMUNITY): Payer: Medicare Other | Admitting: Certified Registered Nurse Anesthetist

## 2021-08-09 ENCOUNTER — Ambulatory Visit (HOSPITAL_COMMUNITY)
Admission: RE | Admit: 2021-08-09 | Discharge: 2021-08-09 | Disposition: A | Discharge status: Home / Self Care | Payer: Medicare Other | Attending: Gastroenterology | Admitting: Gastroenterology

## 2021-08-09 DIAGNOSIS — E119 Type 2 diabetes mellitus without complications: Secondary | ICD-10-CM

## 2021-08-09 DIAGNOSIS — E538 Deficiency of other specified B group vitamins: Secondary | ICD-10-CM | POA: Diagnosis not present

## 2021-08-09 DIAGNOSIS — C49A0 Gastrointestinal stromal tumor, unspecified site: Secondary | ICD-10-CM

## 2021-08-09 DIAGNOSIS — D490 Neoplasm of unspecified behavior of digestive system: Secondary | ICD-10-CM | POA: Diagnosis not present

## 2021-08-09 DIAGNOSIS — K3189 Other diseases of stomach and duodenum: Secondary | ICD-10-CM | POA: Diagnosis not present

## 2021-08-09 DIAGNOSIS — Z79899 Other long term (current) drug therapy: Secondary | ICD-10-CM | POA: Insufficient documentation

## 2021-08-09 DIAGNOSIS — K2970 Gastritis, unspecified, without bleeding: Secondary | ICD-10-CM

## 2021-08-09 DIAGNOSIS — C49A2 Gastrointestinal stromal tumor of stomach: Secondary | ICD-10-CM | POA: Insufficient documentation

## 2021-08-09 DIAGNOSIS — I10 Essential (primary) hypertension: Secondary | ICD-10-CM | POA: Diagnosis not present

## 2021-08-09 DIAGNOSIS — D500 Iron deficiency anemia secondary to blood loss (chronic): Secondary | ICD-10-CM | POA: Diagnosis not present

## 2021-08-09 HISTORY — PX: ESOPHAGOGASTRODUODENOSCOPY: SHX5428

## 2021-08-09 HISTORY — PX: BIOPSY: SHX5522

## 2021-08-09 HISTORY — PX: EUS: SHX5427

## 2021-08-09 HISTORY — PX: FINE NEEDLE ASPIRATION: SHX5430

## 2021-08-09 SURGERY — UPPER ENDOSCOPIC ULTRASOUND (EUS) RADIAL
Anesthesia: Monitor Anesthesia Care

## 2021-08-09 MED ORDER — SODIUM CHLORIDE 0.9 % IV SOLN: INTRAVENOUS | Status: DC

## 2021-08-09 MED ORDER — LACTATED RINGERS IV SOLN: INTRAVENOUS | Status: DC | PRN

## 2021-08-09 MED ORDER — PROPOFOL 500 MG/50ML IV EMUL
INTRAVENOUS | Status: DC | PRN
  Administered 2021-08-09: 150 ug/kg/min via INTRAVENOUS

## 2021-08-09 MED ORDER — PROPOFOL 1000 MG/100ML IV EMUL
INTRAVENOUS | Status: AC
  Filled 2021-08-09: qty 100

## 2021-08-09 MED ORDER — LIDOCAINE 2% (20 MG/ML) 5 ML SYRINGE
INTRAMUSCULAR | Status: DC | PRN
  Administered 2021-08-09: 80 mg via INTRAVENOUS

## 2021-08-09 MED ORDER — PROPOFOL 10 MG/ML IV BOLUS
INTRAVENOUS | Status: DC | PRN
  Administered 2021-08-09 (×3): 30 mg via INTRAVENOUS

## 2021-08-09 NOTE — Anesthesia Preprocedure Evaluation (Signed)
Anesthesia Evaluation  ?Patient identified by MRN, date of birth, ID band ?Patient awake ? ? ? ?Reviewed: ?Allergy & Precautions, NPO status , Patient's Chart, lab work & pertinent test results ? ?History of Anesthesia Complications ?(+) PONV and history of anesthetic complications ? ?Airway ?Mallampati: II ? ?TM Distance: >3 FB ?Neck ROM: Full ? ? ? Dental ?no notable dental hx. ? ?  ?Pulmonary ?neg pulmonary ROS,  ?  ?Pulmonary exam normal ?breath sounds clear to auscultation ? ? ? ? ? ? Cardiovascular ?hypertension, Normal cardiovascular exam ?Rhythm:Regular Rate:Normal ? ? ?  ?Neuro/Psych ?negative neurological ROS ? negative psych ROS  ? GI/Hepatic ?negative GI ROS, Neg liver ROS,   ?Endo/Other  ?diabetes, Type 2 ? Renal/GU ?negative Renal ROS  ?negative genitourinary ?  ?Musculoskeletal ?negative musculoskeletal ROS ?(+)  ? Abdominal ?  ?Peds ?negative pediatric ROS ?(+)  Hematology ?negative hematology ROS ?(+)   ?Anesthesia Other Findings ? ? Reproductive/Obstetrics ?negative OB ROS ? ?  ? ? ? ? ? ? ? ? ? ? ? ? ? ?  ?  ? ? ? ? ? ? ? ? ?Anesthesia Physical ?Anesthesia Plan ? ?ASA: 2 ? ?Anesthesia Plan: MAC  ? ?Post-op Pain Management: Minimal or no pain anticipated  ? ?Induction: Intravenous ? ?PONV Risk Score and Plan: 3 and Propofol infusion and Treatment may vary due to age or medical condition ? ?Airway Management Planned: Simple Face Mask ? ?Additional Equipment:  ? ?Intra-op Plan:  ? ?Post-operative Plan:  ? ?Informed Consent: I have reviewed the patients History and Physical, chart, labs and discussed the procedure including the risks, benefits and alternatives for the proposed anesthesia with the patient or authorized representative who has indicated his/her understanding and acceptance.  ? ? ? ?Dental advisory given ? ?Plan Discussed with: CRNA and Surgeon ? ?Anesthesia Plan Comments:   ? ? ? ? ? ? ?Anesthesia Quick Evaluation ? ?

## 2021-08-09 NOTE — Anesthesia Postprocedure Evaluation (Signed)
Anesthesia Post Note ? ?Patient: Bridget Grant ? ?Procedure(s) Performed: UPPER ENDOSCOPIC ULTRASOUND (EUS) RADIAL ?ESOPHAGOGASTRODUODENOSCOPY (EGD) ?FINE NEEDLE ASPIRATION (FNA) LINEAR ? ?  ? ?Patient location during evaluation: PACU ?Anesthesia Type: MAC ?Level of consciousness: awake and alert ?Pain management: pain level controlled ?Vital Signs Assessment: post-procedure vital signs reviewed and stable ?Respiratory status: spontaneous breathing, nonlabored ventilation, respiratory function stable and patient connected to nasal cannula oxygen ?Cardiovascular status: stable and blood pressure returned to baseline ?Postop Assessment: no apparent nausea or vomiting ?Anesthetic complications: no ? ? ?No notable events documented. ? ?Last Vitals:  ?Vitals:  ? 08/09/21 1148 08/09/21 1153  ?BP: 137/65 (!) 138/50  ?Pulse: 73 69  ?Resp: 17 15  ?Temp:    ?SpO2: 100% 98%  ?  ?Last Pain:  ?Vitals:  ? 08/09/21 1153  ?TempSrc:   ?PainSc: 0-No pain  ? ? ?  ?  ?  ?  ?  ?  ? ?Mabrey Howland S ? ? ? ? ?

## 2021-08-09 NOTE — Discharge Instructions (Signed)
YOU HAD AN ENDOSCOPIC PROCEDURE TODAY: Refer to the procedure report and other information in the discharge instructions given to you for any specific questions about what was found during the examination. If this information does not answer your questions, please call [place] office at [phone] to clarify.   YOU SHOULD EXPECT: Some feelings of bloating in the abdomen. Passage of more gas than usual. Walking can help get rid of the air that was put into your GI tract during the procedure and reduce the bloating. If you had a lower endoscopy (such as a colonoscopy or flexible sigmoidoscopy) you may notice spotting of blood in your stool or on the toilet paper. Some abdominal soreness may be present for a day or two, also.  DIET: Your first meal following the procedure should be a light meal and then it is ok to progress to your normal diet. A half-sandwich or bowl of soup is an example of a good first meal. Heavy or fried foods are harder to digest and may make you feel nauseous or bloated. Drink plenty of fluids but you should avoid alcoholic beverages for 24 hours. If you had a esophageal dilation, please see attached instructions for diet.    ACTIVITY: Your care partner should take you home directly after the procedure. You should plan to take it easy, moving slowly for the rest of the day. You can resume normal activity the day after the procedure however YOU SHOULD NOT DRIVE, use power tools, machinery or perform tasks that involve climbing or major physical exertion for 24 hours (because of the sedation medicines used during the test).   SYMPTOMS TO REPORT IMMEDIATELY: A gastroenterologist can be reached at any hour. Please call [phone]  for any of the following symptoms:   Following upper endoscopy (EGD, EUS, ERCP, esophageal dilation) Vomiting of blood or coffee ground material  New, significant abdominal pain  New, significant chest pain or pain under the shoulder blades  Painful or  persistently difficult swallowing  New shortness of breath  Black, tarry-looking or red, bloody stools  FOLLOW UP:  If any biopsies were taken you will be contacted by phone or by letter within the next 1-3 weeks. Call [phone]  if you have not heard about the biopsies in 3 weeks.  Please also call with any specific questions about appointments or follow up tests.  

## 2021-08-09 NOTE — Interval H&P Note (Signed)
History and Physical Interval Note: ? ?08/09/2021 ?10:58 AM ? ?Bridget Grant  has presented today for surgery, with the diagnosis of GIST.  The various methods of treatment have been discussed with the patient and family. After consideration of risks, benefits and other options for treatment, the patient has consented to  Procedure(s): ?UPPER ENDOSCOPIC ULTRASOUND (EUS) RADIAL (N/A) as a surgical intervention.  The patient's history has been reviewed, patient examined, no change in status, stable for surgery.  I have reviewed the patient's chart and labs.  Questions were answered to the patient's satisfaction.   ? ? ?Milus Banister ? ? ?

## 2021-08-09 NOTE — Op Note (Signed)
[facility] ?Patient Name: Bridget Grant ?Procedure Date: 08/09/2021 ?MRN: [id number] ?Attending MD: Milus Banister , MD ?Date of Birth: 04-16-1946 ?CSN: [id number] ?Age: 76 ?Admit Type: Inpatient ?Procedure:                Upper EUS ?Indications:              h/o high grade gastric GIST; now with two masses in  ?                          the stomach on recent imaging ?Providers:                Milus Banister, MD, Burtis Junes, RN, Charlean Merl  ?                          Purcell Nails, Technician, Tyna Jaksch Technician, Ron  ?                          [place] ?Referring MD:             Truitt Merle, MD; Stark Klein, MD ?Medicines:                Monitored Anesthesia Care ?Complications:            No immediate complications. Estimated blood loss:  ?                          None. ?Estimated Blood Loss:     Estimated blood loss: none. ?Procedure:                Pre-Anesthesia Assessment: ?                          - Prior to the procedure, a History and Physical  ?                          was performed, and patient medications and  ?                          allergies were reviewed. The patient's tolerance of  ?                          previous anesthesia was also reviewed. The risks  ?                          and benefits of the procedure and the sedation  ?                          options and risks were discussed with the patient.  ?                          All questions were answered, and informed consent  ?                          was obtained. Prior Anticoagulants: The patient has  ?  taken no previous anticoagulant or antiplatelet  ?                          agents. ASA Grade Assessment: II - A patient with  ?                          mild systemic disease. After reviewing the risks  ?                          and benefits, the patient was deemed in  ?                          satisfactory condition to undergo the procedure. ?                          After obtaining informed  consent, the endoscope was  ?                          passed under direct vision. Throughout the  ?                          procedure, the patient's blood pressure, pulse, and  ?                          oxygen saturations were monitored continuously. The  ?                          [id number]-[id number] ([id number]) Olympus endoscope was introduced  ?                          through the mouth, and advanced to the second part  ?                          of duodenum. The [id number] ([id number]) Olympus linear  ?                          ultrasound scope was introduced through the mouth,  ?                          and advanced to the second part of duodenum. The  ?                          upper EUS was accomplished without difficulty. The  ?                          patient tolerated the procedure well. ?Scope In: ?Scope Out: ?Findings: ?     ENDOSCOPIC FINDING (with linear EUS scope and standard adult  ?     gastroscope): : ?     1. The esophagus and GE junction were normal ?     2. There were 2 masses in the stomach. The more proximal was completely  ?     submucosal, located about 3 cm distal to the GE junction, measured about  ?     2 cm.  The more distal mass was bulging, abnormal overlying mucosa,  ?     approximately 3 cm, located at the distal aspect of the wedge resection  ?     "anastomosis." The distal mass was biopsied with forceps, bite on bite.  ?     Sent to pathology. The proximal mass was sampled by EUS, FNA. ?     Endosonographic findings with linear echoendoscope: ?     1. The proximal mass was hypoechoic, heterogeneous, measured 2.8 cm  ?     maximally, clearly communicated with the muscularis propria. I sampled  ?     this mass with 2 passes with an acquire 22-gauge EUS FNB needle. ?     2. The distal mass was hypoechoic, heterogeneous, measured 3 cm  ?     maximally. Also clearly communicated with the muscularis propria. I  ?     sampled this mass with biopsies because there clearly was a mucosal  ?      component to the lesion. ?     3. No perigastric adenopathy ?     4. Limited views of the liver, pancreas, spleen were all normal ?Impression:               - There were 2 masses in the stomach. The proximal  ?                          mass was located about the 3 cm distal to the GE  ?                          junction, measured 2.8 cm maximally, it was  ?                          completely submucosal. I sampled this mass with  ?                          EUS, FNB. The distal mass 3 cm maximally, it was  ?                          located at the distal aspect of the previous wedge  ?                          resection "anastomosis." I sampled this mass with  ?                          mucosal biopsies as there was clearly a mucosal  ?                          component. ?Moderate Sedation: ?     Not Applicable - Patient had care per Anesthesia. ?Recommendation:           - Discharge patient to home. ?                          - Await pathology and cytology results. ?Procedure Code(s):        --- Professional --- ?  94320, Esophagogastroduodenoscopy, flexible,  ?                          transoral; with biopsy, single or multiple ?Diagnosis Code(s):        --- Professional --- ?                          K29.70, Gastritis, unspecified, without bleeding ?                          R93.3, Abnormal findings on diagnostic imaging of  ?                          other parts of digestive tract ?CPT copyright 2019 American Medical Association. All rights reserved. ?The codes documented in this report are preliminary and upon coder review may  ?be revised to meet current compliance requirements. ?Milus Banister, MD ?08/09/2021 11:38:42 AM ?This report has been signed electronically. ?Number of Addenda: 0 ?

## 2021-08-09 NOTE — Transfer of Care (Signed)
Immediate Anesthesia Transfer of Care Note ? ?Patient: Bridget Grant ? ?Procedure(s) Performed: UPPER ENDOSCOPIC ULTRASOUND (EUS) RADIAL ?ESOPHAGOGASTRODUODENOSCOPY (EGD) ?FINE NEEDLE ASPIRATION (FNA) LINEAR ? ?Patient Location: PACU and Endoscopy Unit ? ?Anesthesia Type:MAC ? ?Level of Consciousness: drowsy and patient cooperative ? ?Airway & Oxygen Therapy: Patient Spontanous Breathing and Patient connected to face mask oxygen ? ?Post-op Assessment: Report given to RN and Post -op Vital signs reviewed and stable ? ?Post vital signs: Reviewed and stable ? ?Last Vitals:  ?Vitals Value Taken Time  ?BP    ?Temp    ?Pulse 73 08/09/21 1135  ?Resp 16 08/09/21 1135  ?SpO2 96 % 08/09/21 1135  ?Vitals shown include unvalidated device data. ? ?Last Pain:  ?Vitals:  ? 08/09/21 1024  ?TempSrc: Temporal  ?PainSc: 0-No pain  ?   ? ?  ? ?Complications: No notable events documented. ?

## 2021-08-12 ENCOUNTER — Encounter (HOSPITAL_COMMUNITY): Payer: Self-pay | Admitting: Gastroenterology

## 2021-08-13 LAB — CYTOLOGY - NON PAP

## 2021-08-13 LAB — SURGICAL PATHOLOGY

## 2021-08-15 ENCOUNTER — Telehealth: Payer: Self-pay | Admitting: Hematology

## 2021-08-15 NOTE — Telephone Encounter (Signed)
Called patient to schedule appointment per 3/7 inbasket, patient is aware of date and time.  ?

## 2021-08-20 ENCOUNTER — Inpatient Hospital Stay: Payer: Medicare Other

## 2021-08-22 ENCOUNTER — Other Ambulatory Visit: Payer: Self-pay

## 2021-08-22 NOTE — Progress Notes (Signed)
The proposed treatment discussed in conference is for discussion purpose only and is not a binding recommendation.  The patients have not been physically examined, or presented with their treatment options.  Therefore, final treatment plans cannot be decided.  

## 2021-08-24 DIAGNOSIS — C49A2 Gastrointestinal stromal tumor of stomach: Secondary | ICD-10-CM | POA: Diagnosis not present

## 2021-08-25 ENCOUNTER — Other Ambulatory Visit: Payer: Self-pay | Admitting: Hematology

## 2021-08-25 MED ORDER — SUNITINIB MALATE 50 MG PO CAPS: 50.0000 mg | ORAL_CAPSULE | Freq: Every day | ORAL | 0 refills | Status: DC

## 2021-08-27 ENCOUNTER — Ambulatory Visit: Payer: Medicare Other | Admitting: Hematology

## 2021-08-27 ENCOUNTER — Telehealth: Payer: Self-pay

## 2021-08-27 ENCOUNTER — Ambulatory Visit: Payer: Medicare Other

## 2021-08-27 ENCOUNTER — Telehealth: Payer: Self-pay | Admitting: Pharmacist

## 2021-08-27 NOTE — Telephone Encounter (Signed)
Oral Oncology Patient Advocate Encounter ?  ?Received notification from [facility] that prior authorization for Sutent is required. ?  ?PA submitted on CoverMyMeds ?[place] ?Status is pending ?  ?Oral Oncology Clinic will continue to follow. ? ?Wynn Maudlin CPHT ?Specialty Pharmacy Patient Advocate ?[place] ?Phone [phone] ?Fax [phone] ?08/27/2021 8:42 AM ? ? ?

## 2021-08-27 NOTE — Telephone Encounter (Signed)
Oral Oncology Patient Advocate Encounter ? ?Prior Authorization for Sutent has been approved.   ? ?PA# [id number] ?Effective dates: 07/28/21 through 08/27/22 ? ?Patient must fill at [facility] ? ?Oral Oncology Clinic will continue to follow.  ? ?Wynn Maudlin CPHT ?Specialty Pharmacy Patient Advocate ?[place] ?Phone [phone] ?Fax [phone] ?08/27/2021 9:18 AM ? ?

## 2021-08-27 NOTE — Telephone Encounter (Signed)
Oral Oncology Pharmacist Encounter ? ?Received new prescription for Sutent (sunitinib) for the treatment of progressive GIST, planned duration until disease progression or unacceptable drug toxicity. ? ?CBC w/ Diff and CMP from 07/02/21 assessed, no relevant lab abnormalities noted.  ? ?Current medication list in Epic reviewed, DDIs with Sutent identified: ?Category C drug-drug interaction between Sutent and Metformin - Metformin may enhance hypoglycemic effects of stuent (although hypoglycemia is reported at low incidence). Recommend continued monitoring while on therapy - no change in therapy warranted. ?Category C DDI between Sutent and Ondansetron due to risk of Qtc prolongation - Noted patient only taking PRN and PO route, risk higher with IV administration. No change in therapy warranted at this time.  ?Category C drug-drug interaction between Sutent and Diltiazem - Diltiazem, a moderate CYP3A4 inhibitor may cause increase in serum concentrations of Sutent leading to increased risk of ADEs - no change in therapy warranted at this time, recommend close monitoring for ADEs from Sutent ? ?Evaluated chart and no patient barriers to medication adherence noted.  ? ?Prescription has been e-scribed to [facility].  ? ?Oral Oncology Clinic will continue to follow for insurance authorization, copayment issues, initial counseling and start date. ? ?Leron Croak, PharmD, BCPS ?Hematology/Oncology Clinical Pharmacist ?[facility] Oral Chemotherapy Navigation Clinics ?[phone] ?08/27/2021 9:05 AM ? ? ? ?

## 2021-08-29 ENCOUNTER — Telehealth: Payer: Self-pay

## 2021-08-29 NOTE — Telephone Encounter (Signed)
This nurse spoke with this patient and advised that provider states that patient can keep her appointment on 3/23.  Patient acknowledged understanding.  No further concerns at this time.   ?

## 2021-08-30 ENCOUNTER — Other Ambulatory Visit: Payer: Self-pay

## 2021-08-30 ENCOUNTER — Inpatient Hospital Stay: Payer: Medicare Other

## 2021-08-30 ENCOUNTER — Inpatient Hospital Stay: Payer: Medicare Other | Attending: Hematology | Admitting: Hematology

## 2021-08-30 VITALS — BP 157/69 | HR 74 | Temp 98.6°F | Resp 18 | Ht 62.0 in | Wt 190.5 lb

## 2021-08-30 DIAGNOSIS — C49A2 Gastrointestinal stromal tumor of stomach: Secondary | ICD-10-CM | POA: Diagnosis not present

## 2021-08-30 DIAGNOSIS — Z1329 Encounter for screening for other suspected endocrine disorder: Secondary | ICD-10-CM | POA: Diagnosis not present

## 2021-08-30 DIAGNOSIS — E538 Deficiency of other specified B group vitamins: Secondary | ICD-10-CM | POA: Insufficient documentation

## 2021-08-30 DIAGNOSIS — D500 Iron deficiency anemia secondary to blood loss (chronic): Secondary | ICD-10-CM

## 2021-08-30 MED ORDER — CYANOCOBALAMIN 1000 MCG/ML IJ SOLN
1000.0000 ug | Freq: Once | INTRAMUSCULAR | Status: AC
  Administered 2021-08-30: 1000 ug via INTRAMUSCULAR
  Filled 2021-08-30: qty 1

## 2021-08-30 NOTE — Telephone Encounter (Signed)
Oral Chemotherapy Pharmacist Encounter ? ?I spoke with patient for overview of: Sutent (sunitinib) for the treatment of progressive GIST, planned duration until disease progression or unacceptable toxicity.  ? ?Counseled patient on administration, dosing, side effects, monitoring, drug-food interactions, safe handling, storage, and disposal. ? ?Patient will take Sutent '50mg'$  capsules, 1 capsule by mouth once daily with or without food. ? ?Sutent will be administered 4 weeks on, 2 weeks off, repeat every 6 weeks. ? ?Patient knows to aviod grapefruit and grapefruit juice while on Sutent. ? ?Sutent start date: 09/03/21 ? ?Adverse effects include but are not limited to: fatigue, diarrhea, nausea, vomiting, mouth sores, rash, hand-foot syndrome, bleeding events, and altered cardiac function. ? ?Patient has anti-emetic on hand and knows to take it if nausea develops.   ?Patient will obtain anti diarrheal and alert the office of 4 or more loose stools above baseline. ? ?Patient informed Sutent should be temporarily interrupted for major surgical procedures and can be restarted upon recovery from surgery.    ? ?Reviewed with patient importance of keeping a medication schedule and plan for any missed doses. No barriers to medication adherence identified. ? ?Medication reconciliation performed and medication/allergy list updated. ? ?Insurance authorization for Sutent has been obtained. Patient's insurance requires this to be filled through [facility]. This will be delivered to patient's home on 09/03/21. ? ?Patient stated her copay currently is $80 for 28 tablets of Sutent. She states this is currently affordable and was the same cost as her previous therapy (imatinib). She knows to reach out if this cost becomes unaffordable. There are currently no open foundation grants for GIST at this time. ? ?All questions answered. ? ?Bridget Grant voiced understanding and appreciation.  ? ?Medication education handout given to  patient. Patient knows to call the office with questions or concerns. Oral Chemotherapy Clinic phone number provided to patient.  ? ?Leron Croak, PharmD, BCPS ?Hematology/Oncology Clinical Pharmacist ?[facility] Oral Chemotherapy Navigation Clinics ?[phone] ?08/30/2021 2:45 PM ? ?

## 2021-08-30 NOTE — Progress Notes (Signed)
?[place]   ?[phone] [phone]   ?Clinic Follow up Note  ? ?Patient Care Team: ?[name], MD as PCP - General ?Truitt Merle, MD as Consulting Physician (Oncology) ?Jonnie Finner, RN (Inactive) as Oncology Nurse Navigator ?Stark Klein, MD as Consulting Physician (General Surgery) ?Milus Banister, MD as Attending Physician (Gastroenterology) ? ?Date of Service:  08/30/2021 ? ?CHIEF COMPLAINT: f/u of GIST ? ?CURRENT THERAPY:  ?-to start sunitinib 50 mg, 4 weeks on/2 weeks off ?-B12 injection, monthly, 04/14/20-12/05/20. Restarted q6weeks on 03/23/21. ? ?ASSESSMENT & PLAN:  ?Bridget Grant is a 76 y.o. female with  ? ?1. Gastric GIST, ypT3NxM0,  High grade, Kit mutation (+),local recurrence in 07/2021 ?-She was diagnosed in 01/2020 with High grade gastrointestinal stromal tumor.  ?-Her staging CT CAP from 01/13/20 did not show gastric mass (I think the greater curvature of gastric wall is thickened) but did show incidental findings of left breast mass and 6.74m sclerotic lesion at T7.  ?-Given her bleeding tumor, large size and other work up, I started her on Gleevec 404mon 01/27/20, prior to surgery.  ?-She underwent surgery on 02/29/20 with Dr ByBarry DienesSurgical path shows: high-grade GIST spanning 8 cm infiltrating through most of the distal gastric wall, with a positive distal resection margin, staged pT3; areas of rhabdomyosarcomatous differentiation but is not felt to represent a mixed rhabdomyosarcoma/GIST tumor. Unfortunately, given these features she is at very high risk for recurrence and metastasis.  ?-most recent CT AP from 07/02/21 showed no evidence of recurrence.  She has a stable 15 mm liver lesion which has been stable, likely benign. ?-liver MRI on 07/26/21 showed: two enhancing gastric wall masses, concerning for tumor recurrence; a third enhancing soft tissue nodularity at GE junction/within distal-most esophagus, indeterminate; lesion in left hepatic lobe  seen on CT is not well visualized, no definitely enhancing. ?-repeat EUS on 08/09/21 with Dr. JaArdis Hughshowed the two stomach masses. Pathology confirmed spindle cell proliferation consistent with GIST. ?-she met with Dr. ByBarry Dienesn 08/24/21 to discuss surgery. They discussed that she would require a total gastrectomy, which is a big surgery. She is reluctant to consider surgery and declined  ?-I discussed systemic therapy options.  Since she progresses through imatinib, I recommend her to switch to sunitinib.  The potential benefit and side effects were discussed with her in detail, she agrees to proceed.  She has already discussed this medication with our pharmacist ReWells Guileswho met with her again today. She will start when she receives the medicine. ?-We discussed that if she does not tolerate the standard dose 50 mg daily 4 weeks on, 2 weeks off, then we will switch to 37.5 mg daily ?-we will monitor her thyroid function on treatment. ?  ?2. Anemia secondary to GI bleeding from #1, B12 deficiency  ?-She was hospitalized on 01/12/20 due to severe anemia with Hg 7.9.  ?-She was treated with blood transfusion on 01/17/20 and 03/06/20 and IV Feraheme on 01/15/20 and 02/09/20 ?-she was previously on monthly B12 injections, lost to f/u 12/2020-02/2021, restarted B12 injections 03/23/21 q6weeks. ?  ?  ?PLAN:  ?-proceed with B12 injection today ?-return to lab for baseline thyroid function labs ?-start sunitinib soon ?-B12 injection every 4-6 weeks  ?-phone visit week of 4/3 ?-lab and f/u on 4/25 or 4/26 ? ? ?No problem-specific Assessment & Plan notes found for this encounter. ? ? ?SUMMARY OF ONCOLOGIC HISTORY: ?Oncology History Overview Note  ?Cancer Staging ?Malignant gastrointestinal stromal  tumor (GIST) of stomach ([place]) ?Staging form: Gastrointestinal Stromal Tumor - Gastric and Omental GIST, AJCC 8th Edition ?- Pathologic stage from 02/29/2020: pT3, Mitotic Rate: High - Unsigned ?Histologic grade (G): High grade ?Histologic grading  system: 2 grade system ?Location of the gastrointestinal stromal tumor: Stomach  ?  ?Malignant gastrointestinal stromal tumor (GIST) of stomach ([place])  ?01/13/2020 Procedure  ? EGD/Upper Endoscopy by Dr Ardis Hughs 01/13/20  ?IMPRESSION ?- There was an ulcerated mass along the greater curvature of the stomach, proximal edge ?was about 5-6cm from the GE junction and the mass was 7-8cm long, 2-3cm wide. It was ?not circumferental. The mass was friable with spontaneous oozing, contact oozing, ?ulcerated and heaped up. Some parts of the mass were quite soft whereas other areas ?were more firm. This was very suspicious for a malignancy and it was biopsied extensively. ?There was mild distal gastritis, biopsied to check for H. pylori. ?- The examination was otherwise normal. ?  ?01/13/2020 Initial Biopsy  ? FINAL MICROSCOPIC DIAGNOSIS:  ?A. STOMACH, BIOPSY:  ?- High grade gastrointestinal stromal tumor, see comment.  ? ?B. STOMACH, DISTAL, BIOPSY:  ?- Mild reactive gastropathy with erosions.  ?- Warthin-Starry is negative for Helicobacter pylori.  ?- No intestinal metaplasia, dysplasia, or malignancy.  ?COMMENT:  ?A. The tumor has spindle and epithelioid components and appears high  ?grade (7 mitosis/20 hpf). Immunohistochemistry is positive for CD117 and  ?CD34. The cells are negative for cytokeratin 20, cytokeratin 7,  ?cytokeratin 5/6, CDX-2, SMA, and desmin. The findings are consistent  ?with a gastrointestinal stromal tumor. Dr. Vic Ripper has reviewed the  ?case. Dr. Ardis Hughs' nurse was notified on 01/18/2020.  ? ?  ?01/13/2020 Imaging  ? CT CAP W contrast 01/13/20  ?IMPRESSION: ?1. 2.1 cm x 1.1 cm well-defined area of low attenuation within the ?soft tissues of the left breast. This may represent a lymph node, ?however, correlation with mammography is recommended. ?2. 6.5 mm sclerotic focus within the posterior aspect of the T7 ?vertebral body. This may represent a bone island, however, ?correlation with follow-up abdomen pelvis CT is  recommended to ?exclude the presence of a metastatic lesion. ?3. Small hiatal hernia. ?4. 2.6 cm x 2.0 cm fat containing umbilical hernia. ?5. Aortic atherosclerosis. ? ?  ?01/23/2020 Initial Diagnosis  ? Malignant gastrointestinal stromal tumor (GIST) of stomach ([place]) ?  ?01/26/2020 -  Chemotherapy  ? Gleevec 45m once daily starting 01/26/20 ? ?  ?02/11/2020 PET scan  ? IMPRESSION: ?1. Focal mass along the stomach fundus with maximum SUV 15.7, ?compatible with malignancy. No findings of metastatic spread. ?2. Mildly increased sub solid nodularity in the left upper lobe with ?only very subtle associated activity, likely inflammatory. ?Incidental note is made of some secondary pulmonary lobular septal ?thickening in the lung apices, and mild interstitial edema is not ?excluded given the underlying cardiomegaly. ?3. Other imaging findings of potential clinical significance: Aortic ?Atherosclerosis (ICD10-I70.0). Coronary atherosclerosis. Sigmoid ?colon diverticulosis. Small umbilical hernia contains adipose ?tissue. Physiologic activity along the costovertebral joints. ?  ?02/29/2020 Surgery  ? Partial gastrectomy by Dr. BBarry Dienes ?  ?02/29/2020 Pathology Results  ? FINAL MICROSCOPIC DIAGNOSIS:  ?A. STOMACH, PARTIAL GASTRECTOMY:  ?- High grade gastrointestinal stromal tumor with dedifferentiation,  ?spanning approximately 8 cm  ?- Tumor infiltrates through most of the distal gastric wall and involves  ?the distal resection margin  ?- See oncology table  ?- See comment  ?Immunohistochemical stains show that the tumor cells are positive for  ?CD117, CD34 and SMA (patchy).  A  component of the tumor shows very  ?high-grade features with loss of CD117 and CD34 expression, consistent  ?with dedifferentiation.  Tumor also shows areas with  ?rhabdomyosarcomatous differentiation with presence of desmin expression.  ?  ?04/13/2020 Procedure  ? EUS  by Dr Ardis Hughs  ?IMPRESSION ?- The staple line from recent partial gastrectomy was noted  in the proximal stomach, runs ?longitudinally along the gastric fundus. The mucosa immediately around the stable line was ?normal appearing however in the more distal gastric body (anterior wall likel

## 2021-09-01 ENCOUNTER — Encounter: Payer: Self-pay | Admitting: Hematology

## 2021-09-14 ENCOUNTER — Encounter: Payer: Self-pay | Admitting: Hematology

## 2021-09-14 ENCOUNTER — Inpatient Hospital Stay: Payer: Medicare Other | Attending: Hematology | Admitting: Hematology

## 2021-09-14 DIAGNOSIS — E538 Deficiency of other specified B group vitamins: Secondary | ICD-10-CM | POA: Insufficient documentation

## 2021-09-14 DIAGNOSIS — C49A2 Gastrointestinal stromal tumor of stomach: Secondary | ICD-10-CM | POA: Insufficient documentation

## 2021-09-14 DIAGNOSIS — Z79899 Other long term (current) drug therapy: Secondary | ICD-10-CM | POA: Insufficient documentation

## 2021-09-14 NOTE — Progress Notes (Signed)
?[place]   ?Telephone:([phone] [phone]   ?Clinic Follow up Note  ? ?Patient Care Team: ?[name], MD as PCP - General ?Truitt Merle, MD as Consulting Physician (Oncology) ?Jonnie Finner, RN (Inactive) as Oncology Nurse Navigator ?Stark Klein, MD as Consulting Physician (General Surgery) ?Milus Banister, MD as Attending Physician (Gastroenterology) ? ?Date of Service:  09/14/2021 ? ?I connected with Bridget Grant on 09/14/2021 at  3:40 PM EDT by telephone visit and verified that I am speaking with the correct person using two identifiers.  ?I discussed the limitations, risks, security and privacy concerns of performing an evaluation and management service by telephone and the availability of in person appointments. I also discussed with the patient that there may be a patient responsible charge related to this service. The patient expressed understanding and agreed to proceed.  ? ?Other persons participating in the visit and their role in the encounter:  none ? ?Patient's location:  home ?Provider's location:  my office ? ?CHIEF COMPLAINT: f/u of GIST ? ?CURRENT THERAPY:  ?-Sunitinib 50 mg, 4 weeks on/2 weeks off, started 09/03/21 ?-B12 injection, monthly, 04/14/20-12/05/20. Restarted q6weeks on 03/23/21. ? ?ASSESSMENT & PLAN:  ?Bridget Grant is a 76 y.o. female with  ? ?1. Gastric GIST, ypT3NxM0,  High grade, Kit mutation (+),local recurrence in 07/2021 ?-She was diagnosed in 01/2020 with High grade gastrointestinal stromal tumor.  ?-Her staging CT CAP from 01/13/20 did not show gastric mass (I think the greater curvature of gastric wall is thickened) but did show incidental findings of left breast mass and 6.6m sclerotic lesion at T7.  ?-Given her bleeding tumor, large size and other work up, I started her on Gleevec 4073mon 01/27/20, prior to surgery.  ?-She underwent surgery on 02/29/20 with Dr ByBarry DienesSurgical path shows: high-grade GIST spanning 8 cm infiltrating through  most of the distal gastric wall, with a positive distal resection margin, staged pT3; areas of rhabdomyosarcomatous differentiation but is not felt to represent a mixed rhabdomyosarcoma/GIST tumor. Unfortunately, given these features she is at very high risk for recurrence and metastasis.  ?-most recent CT AP from 07/02/21 showed no evidence of recurrence.  She has a stable 15 mm liver lesion which has been stable, likely benign. ?-liver MRI on 07/26/21 showed: two enhancing gastric wall masses, concerning for tumor recurrence; a third enhancing soft tissue nodularity at GE junction/within distal-most esophagus, indeterminate; lesion in left hepatic lobe seen on CT is not well visualized, no definitely enhancing. ?-repeat EUS on 08/09/21 with Dr. JaArdis Hughshowed the two stomach masses. Pathology confirmed spindle cell proliferation consistent with GIST. ?-she met with Dr. ByBarry Dienesn 08/24/21 to discuss surgery. They discussed that she would require a total gastrectomy, which is a big surgery. She is reluctant to consider surgery and declined  ?-she switched to sunitinib on 09/03/21, 50 mg daily 4 weeks on, 2 weeks off. She started on 3/27 and has been tolerating well thus far with no noticeable side effects. We will monitor her thyroid function on treatment. ?-she will return for lab, f/u, and B12 on 4/25 ?  ?2. Anemia secondary to GI bleeding from #1, B12 deficiency  ?-She was hospitalized on 01/12/20 due to severe anemia with Hg 7.9.  ?-She was treated with blood transfusion on 01/17/20 and 03/06/20 and IV Feraheme on 01/15/20 and 02/09/20 ?-she was previously on monthly B12 injections, lost to f/u 12/2020-02/2021, restarted B12 injections 03/23/21 q6weeks. ?  ?  ?PLAN:  ?-continue  sunitinib  ?-lab, f/u, and B12 injection on 4/25 as scheduled ? ? ?No problem-specific Assessment & Plan notes found for this encounter. ? ? ?SUMMARY OF ONCOLOGIC HISTORY: ?Oncology History Overview Note  ?Cancer Staging ?Malignant gastrointestinal stromal  tumor (GIST) of stomach ([place]) ?Staging form: Gastrointestinal Stromal Tumor - Gastric and Omental GIST, AJCC 8th Edition ?- Pathologic stage from 02/29/2020: pT3, Mitotic Rate: High - Unsigned ?Histologic grade (G): High grade ?Histologic grading system: 2 grade system ?Location of the gastrointestinal stromal tumor: Stomach  ?  ?Malignant gastrointestinal stromal tumor (GIST) of stomach ([place])  ?01/13/2020 Procedure  ? EGD/Upper Endoscopy by Dr Ardis Hughs 01/13/20  ?IMPRESSION ?- There was an ulcerated mass along the greater curvature of the stomach, proximal edge ?was about 5-6cm from the GE junction and the mass was 7-8cm long, 2-3cm wide. It was ?not circumferental. The mass was friable with spontaneous oozing, contact oozing, ?ulcerated and heaped up. Some parts of the mass were quite soft whereas other areas ?were more firm. This was very suspicious for a malignancy and it was biopsied extensively. ?There was mild distal gastritis, biopsied to check for H. pylori. ?- The examination was otherwise normal. ?  ?01/13/2020 Initial Biopsy  ? FINAL MICROSCOPIC DIAGNOSIS:  ?A. STOMACH, BIOPSY:  ?- High grade gastrointestinal stromal tumor, see comment.  ? ?B. STOMACH, DISTAL, BIOPSY:  ?- Mild reactive gastropathy with erosions.  ?- Warthin-Starry is negative for Helicobacter pylori.  ?- No intestinal metaplasia, dysplasia, or malignancy.  ?COMMENT:  ?A. The tumor has spindle and epithelioid components and appears high  ?grade (7 mitosis/20 hpf). Immunohistochemistry is positive for CD117 and  ?CD34. The cells are negative for cytokeratin 20, cytokeratin 7,  ?cytokeratin 5/6, CDX-2, SMA, and desmin. The findings are consistent  ?with a gastrointestinal stromal tumor. Dr. Vic Ripper has reviewed the  ?case. Dr. Ardis Hughs' nurse was notified on 01/18/2020.  ? ?  ?01/13/2020 Imaging  ? CT CAP W contrast 01/13/20  ?IMPRESSION: ?1. 2.1 cm x 1.1 cm well-defined area of low attenuation within the ?soft tissues of the left breast. This may  represent a lymph node, ?however, correlation with mammography is recommended. ?2. 6.5 mm sclerotic focus within the posterior aspect of the T7 ?vertebral body. This may represent a bone island, however, ?correlation with follow-up abdomen pelvis CT is recommended to ?exclude the presence of a metastatic lesion. ?3. Small hiatal hernia. ?4. 2.6 cm x 2.0 cm fat containing umbilical hernia. ?5. Aortic atherosclerosis. ? ?  ?01/23/2020 Initial Diagnosis  ? Malignant gastrointestinal stromal tumor (GIST) of stomach ([place]) ?  ?01/26/2020 -  Chemotherapy  ? Gleevec 421m once daily starting 01/26/20 ? ?  ?02/11/2020 PET scan  ? IMPRESSION: ?1. Focal mass along the stomach fundus with maximum SUV 15.7, ?compatible with malignancy. No findings of metastatic spread. ?2. Mildly increased sub solid nodularity in the left upper lobe with ?only very subtle associated activity, likely inflammatory. ?Incidental note is made of some secondary pulmonary lobular septal ?thickening in the lung apices, and mild interstitial edema is not ?excluded given the underlying cardiomegaly. ?3. Other imaging findings of potential clinical significance: Aortic ?Atherosclerosis (ICD10-I70.0). Coronary atherosclerosis. Sigmoid ?colon diverticulosis. Small umbilical hernia contains adipose ?tissue. Physiologic activity along the costovertebral joints. ?  ?02/29/2020 Surgery  ? Partial gastrectomy by Dr. BBarry Dienes ?  ?02/29/2020 Pathology Results  ? FINAL MICROSCOPIC DIAGNOSIS:  ?A. STOMACH, PARTIAL GASTRECTOMY:  ?- High grade gastrointestinal stromal tumor with dedifferentiation,  ?spanning approximately 8 cm  ?- Tumor infiltrates through most of  the distal gastric wall and involves  ?the distal resection margin  ?- See oncology table  ?- See comment  ?Immunohistochemical stains show that the tumor cells are positive for  ?CD117, CD34 and SMA (patchy).  A component of the tumor shows very  ?high-grade features with loss of CD117 and CD34 expression,  consistent  ?with dedifferentiation.  Tumor also shows areas with  ?rhabdomyosarcomatous differentiation with presence of desmin expression.  ?  ?04/13/2020 Procedure  ? EUS  by Dr Ardis Hughs  ?IMPRESSION ?- The staple lin

## 2021-09-21 ENCOUNTER — Other Ambulatory Visit: Payer: Self-pay | Admitting: Hematology

## 2021-10-02 ENCOUNTER — Inpatient Hospital Stay: Payer: Medicare Other

## 2021-10-02 ENCOUNTER — Inpatient Hospital Stay (HOSPITAL_BASED_OUTPATIENT_CLINIC_OR_DEPARTMENT_OTHER): Payer: Medicare Other | Admitting: Hematology

## 2021-10-02 ENCOUNTER — Other Ambulatory Visit: Payer: Self-pay

## 2021-10-02 ENCOUNTER — Encounter: Payer: Self-pay | Admitting: Hematology

## 2021-10-02 VITALS — BP 160/66 | HR 61 | Temp 97.7°F | Resp 18 | Ht 62.0 in | Wt 181.7 lb

## 2021-10-02 DIAGNOSIS — C49A2 Gastrointestinal stromal tumor of stomach: Secondary | ICD-10-CM | POA: Diagnosis not present

## 2021-10-02 DIAGNOSIS — Z1329 Encounter for screening for other suspected endocrine disorder: Secondary | ICD-10-CM

## 2021-10-02 DIAGNOSIS — D500 Iron deficiency anemia secondary to blood loss (chronic): Secondary | ICD-10-CM

## 2021-10-02 DIAGNOSIS — E538 Deficiency of other specified B group vitamins: Secondary | ICD-10-CM | POA: Diagnosis not present

## 2021-10-02 DIAGNOSIS — Z79899 Other long term (current) drug therapy: Secondary | ICD-10-CM | POA: Diagnosis not present

## 2021-10-02 LAB — RETIC PANEL
Immature Retic Fract: 19.2 % — ABNORMAL HIGH (ref 2.3–15.9)
RBC.: 3.36 MIL/uL — ABNORMAL LOW (ref 3.87–5.11)
Retic Count, Absolute: 98.8 10*3/uL (ref 19.0–186.0)
Retic Ct Pct: 2.9 % (ref 0.4–3.1)
Reticulocyte Hemoglobin: 41.4 pg (ref >27.9)

## 2021-10-02 LAB — CMP (CANCER CENTER ONLY)
ALT: 39 U/L (ref 0–44)
AST: 40 U/L (ref 15–41)
Albumin: 4.1 g/dL (ref 3.5–5.0)
Alkaline Phosphatase: 69 U/L (ref 38–126)
Anion gap: 7 (ref 5–15)
BUN: 11 mg/dL (ref 8–23)
CO2: 33 mmol/L — ABNORMAL HIGH (ref 22–32)
Calcium: 9 mg/dL (ref 8.9–10.3)
Chloride: 101 mmol/L (ref 98–111)
Creatinine: 0.75 mg/dL (ref 0.44–1.00)
GFR, Estimated: >60 mL/min (ref >60)
Glucose, Bld: 117 mg/dL — ABNORMAL HIGH (ref 70–99)
Potassium: 3.3 mmol/L — ABNORMAL LOW (ref 3.5–5.1)
Sodium: 141 mmol/L (ref 135–145)
Total Bilirubin: 0.7 mg/dL (ref 0.3–1.2)
Total Protein: 6.9 g/dL (ref 6.5–8.1)

## 2021-10-02 LAB — CBC WITH DIFFERENTIAL (CANCER CENTER ONLY)
Abs Immature Granulocytes: 0.01 10*3/uL (ref 0.00–0.07)
Basophils Absolute: 0 10*3/uL (ref 0.0–0.1)
Basophils Relative: 1 %
Eosinophils Absolute: 0.4 10*3/uL (ref 0.0–0.5)
Eosinophils Relative: 9 %
HCT: 34.2 % — ABNORMAL LOW (ref 36.0–46.0)
Hemoglobin: 11.8 g/dL — ABNORMAL LOW (ref 12.0–15.0)
Immature Granulocytes: 0 %
Lymphocytes Relative: 44 %
Lymphs Abs: 2 10*3/uL (ref 0.7–4.0)
MCH: 34.9 pg — ABNORMAL HIGH (ref 26.0–34.0)
MCHC: 34.5 g/dL (ref 30.0–36.0)
MCV: 101.2 fL — ABNORMAL HIGH (ref 80.0–100.0)
Monocytes Absolute: 0.3 10*3/uL (ref 0.1–1.0)
Monocytes Relative: 7 %
Neutro Abs: 1.8 10*3/uL (ref 1.7–7.7)
Neutrophils Relative %: 39 %
Platelet Count: 145 10*3/uL — ABNORMAL LOW (ref 150–400)
RBC: 3.38 MIL/uL — ABNORMAL LOW (ref 3.87–5.11)
RDW: 15.4 % (ref 11.5–15.5)
WBC Count: 4.6 10*3/uL (ref 4.0–10.5)
nRBC: 0 % (ref 0.0–0.2)

## 2021-10-02 LAB — TSH: TSH: 16.076 u[IU]/mL — ABNORMAL HIGH (ref 0.350–4.500)

## 2021-10-02 LAB — FERRITIN: Ferritin: 625 ng/mL — ABNORMAL HIGH (ref 11–307)

## 2021-10-02 LAB — T4, FREE: Free T4: 0.76 ng/dL (ref 0.61–1.12)

## 2021-10-02 MED ORDER — CYANOCOBALAMIN 1000 MCG/ML IJ SOLN
1000.0000 ug | Freq: Once | INTRAMUSCULAR | Status: AC
  Administered 2021-10-02: 1000 ug via INTRAMUSCULAR
  Filled 2021-10-02: qty 1

## 2021-10-02 MED ORDER — SUNITINIB MALATE 37.5 MG PO CAPS: 37.5000 mg | ORAL_CAPSULE | Freq: Every day | ORAL | 1 refills | Status: DC

## 2021-10-02 NOTE — Progress Notes (Signed)
?[place]   ?Telephone:(336) [phone] [phone]   ?Clinic Follow up Note  ? ?Patient Care Team: ?[name], MD as PCP - General ?Truitt Merle, MD as Consulting Physician (Oncology) ?Jonnie Finner, RN (Inactive) as Oncology Nurse Navigator ?Stark Klein, MD as Consulting Physician (General Surgery) ?Milus Banister, MD as Attending Physician (Gastroenterology) ? ?Date of Service:  10/02/2021 ? ?CHIEF COMPLAINT: f/u of GIST ? ?CURRENT THERAPY:  ?-Sunitinib, started 09/03/21, currently 37.5 mg daily ?-B12 injection, monthly, 04/14/20-12/05/20. Restarted q6weeks on 03/23/21. ? ?ASSESSMENT & PLAN:  ?[name] is a 76 y.o. female with  ? ?1. Gastric GIST, ypT3NxM0,  High grade, Kit mutation (+),local recurrence in 07/2021 ?-She was diagnosed in 01/2020 with High grade gastrointestinal stromal tumor.  ?-Her staging CT CAP from 01/13/20 did not show gastric mass (I think the greater curvature of gastric wall is thickened) but did show incidental findings of left breast mass and 6.49m sclerotic lesion at T7.  ?-Given her bleeding tumor, large size and other work up, I started her on Gleevec 40933mon 01/27/20, prior to surgery.  ?-She underwent surgery on 02/29/20 with Dr ByBarry DienesSurgical path shows: high-grade GIST spanning 8 cm infiltrating through most of the distal gastric wall, with a positive distal resection margin, staged pT3; areas of rhabdomyosarcomatous differentiation but is not felt to represent a mixed rhabdomyosarcoma/GIST tumor. Unfortunately, given these features she is at very high risk for recurrence and metastasis.  ?-most recent CT AP from 07/02/21 showed no evidence of recurrence.  She has a stable 15 mm liver lesion which has been stable, likely benign. ?-liver MRI on 07/26/21 showed: two enhancing gastric wall masses, concerning for tumor recurrence; a third enhancing soft tissue nodularity at GE junction/within distal-most esophagus, indeterminate; lesion in left hepatic  lobe seen on CT is not well visualized, no definitely enhancing. ?-repeat EUS on 08/09/21 with Dr. JaArdis Hughshowed the two stomach masses. Pathology confirmed spindle cell proliferation consistent with GIST. ?-she met with Dr. ByBarry Dienesn 08/24/21 to discuss surgery. They discussed that she would require a total gastrectomy, which is a big surgery. She is reluctant to consider surgery and declined  ?-she switched to sunitinib on 09/03/21; we will monitor her thyroid function on treatment. She started at 50 mg daily 4 weeks on, 2 weeks off. She has developed some stomach discomfort and fatigue. I recommend lowering her dose and taking daily; she is agreeable. She completed her first cycle yesterday, 09/30/21. I advised her to take her two weeks off as scheduled then start the new lower dose. ?-I will see her back in 6 weeks, and we will plan for restaging scan in 3 months. ?  ?2. Anemia secondary to GI bleeding from #1, B12 deficiency  ?-She was hospitalized on 01/12/20 due to severe anemia with Hg 7.9.  ?-She was treated with blood transfusion on 01/17/20 and 03/06/20 and IV Feraheme on 01/15/20 and 02/09/20 ?-she was previously on monthly B12 injections, lost to f/u 12/2020-02/2021, restarted B12 injections 03/23/21 q6weeks. She will receive a dose today (10/02/21). ?  ?  ?PLAN:  ?-B12 injection today ?-continue sunitinib (she started 2 weeks break today), will change dose to 37.33m21maily starting on 4/9 after her break due to GI side effects   ?-lab, f/u, and B12 injection in 6 weeks ? ? ?No problem-specific Assessment & Plan notes found for this encounter. ? ? ?SUMMARY OF ONCOLOGIC HISTORY: ?Oncology History Overview Note  ?Cancer Staging ?Malignant gastrointestinal stromal tumor (  GIST) of stomach ([place]) ?Staging form: Gastrointestinal Stromal Tumor - Gastric and Omental GIST, AJCC 8th Edition ?- Pathologic stage from 02/29/2020: pT3, Mitotic Rate: High - Unsigned ?Histologic grade (G): High grade ?Histologic grading system: 2 grade  system ?Location of the gastrointestinal stromal tumor: Stomach  ?  ?Malignant gastrointestinal stromal tumor (GIST) of stomach ([place])  ?01/13/2020 Procedure  ? EGD/Upper Endoscopy by Dr Ardis Hughs 01/13/20  ?IMPRESSION ?- There was an ulcerated mass along the greater curvature of the stomach, proximal edge ?was about 5-6cm from the GE junction and the mass was 7-8cm long, 2-3cm wide. It was ?not circumferental. The mass was friable with spontaneous oozing, contact oozing, ?ulcerated and heaped up. Some parts of the mass were quite soft whereas other areas ?were more firm. This was very suspicious for a malignancy and it was biopsied extensively. ?There was mild distal gastritis, biopsied to check for H. pylori. ?- The examination was otherwise normal. ?  ?01/13/2020 Initial Biopsy  ? FINAL MICROSCOPIC DIAGNOSIS:  ?A. STOMACH, BIOPSY:  ?- High grade gastrointestinal stromal tumor, see comment.  ? ?B. STOMACH, DISTAL, BIOPSY:  ?- Mild reactive gastropathy with erosions.  ?- Warthin-Starry is negative for Helicobacter pylori.  ?- No intestinal metaplasia, dysplasia, or malignancy.  ?COMMENT:  ?A. The tumor has spindle and epithelioid components and appears high  ?grade (7 mitosis/20 hpf). Immunohistochemistry is positive for CD117 and  ?CD34. The cells are negative for cytokeratin 20, cytokeratin 7,  ?cytokeratin 5/6, CDX-2, SMA, and desmin. The findings are consistent  ?with a gastrointestinal stromal tumor. Dr. Vic Ripper has reviewed the  ?case. Dr. Ardis Hughs' nurse was notified on 01/18/2020.  ? ?  ?01/13/2020 Imaging  ? CT CAP W contrast 01/13/20  ?IMPRESSION: ?1. 2.1 cm x 1.1 cm well-defined area of low attenuation within the ?soft tissues of the left breast. This may represent a lymph node, ?however, correlation with mammography is recommended. ?2. 6.5 mm sclerotic focus within the posterior aspect of the T7 ?vertebral body. This may represent a bone island, however, ?correlation with follow-up abdomen pelvis CT is recommended  to ?exclude the presence of a metastatic lesion. ?3. Small hiatal hernia. ?4. 2.6 cm x 2.0 cm fat containing umbilical hernia. ?5. Aortic atherosclerosis. ? ?  ?01/23/2020 Initial Diagnosis  ? Malignant gastrointestinal stromal tumor (GIST) of stomach ([place]) ?  ?01/26/2020 -  Chemotherapy  ? Gleevec 47m once daily starting 01/26/20 ? ?  ?02/11/2020 PET scan  ? IMPRESSION: ?1. Focal mass along the stomach fundus with maximum SUV 15.7, ?compatible with malignancy. No findings of metastatic spread. ?2. Mildly increased sub solid nodularity in the left upper lobe with ?only very subtle associated activity, likely inflammatory. ?Incidental note is made of some secondary pulmonary lobular septal ?thickening in the lung apices, and mild interstitial edema is not ?excluded given the underlying cardiomegaly. ?3. Other imaging findings of potential clinical significance: Aortic ?Atherosclerosis (ICD10-I70.0). Coronary atherosclerosis. Sigmoid ?colon diverticulosis. Small umbilical hernia contains adipose ?tissue. Physiologic activity along the costovertebral joints. ?  ?02/29/2020 Surgery  ? Partial gastrectomy by Dr. BBarry Dienes ?  ?02/29/2020 Pathology Results  ? FINAL MICROSCOPIC DIAGNOSIS:  ?A. STOMACH, PARTIAL GASTRECTOMY:  ?- High grade gastrointestinal stromal tumor with dedifferentiation,  ?spanning approximately 8 cm  ?- Tumor infiltrates through most of the distal gastric wall and involves  ?the distal resection margin  ?- See oncology table  ?- See comment  ?Immunohistochemical stains show that the tumor cells are positive for  ?CD117, CD34 and SMA (patchy).  A component  of the tumor shows very  ?high-grade features with loss of CD117 and CD34 expression, consistent  ?with dedifferentiation.  Tumor also shows areas with  ?rhabdomyosarcomatous differentiation with presence of desmin expression.  ?  ?04/13/2020 Procedure  ? EUS  by Dr Ardis Hughs  ?IMPRESSION ?- The staple line from recent partial gastrectomy was noted in the  proximal stomach, runs ?longitudinally along the gastric fundus. The mucosa immediately around the stable line was ?normal appearing however in the more distal gastric body (anterior wall likely) the mucosa ?was qu

## 2021-10-04 ENCOUNTER — Telehealth: Payer: Self-pay

## 2021-10-04 NOTE — Telephone Encounter (Signed)
Spoke with pt regarding recent lab results.  Informed pt that her Thyroid functions is slightly off and Dr. Burr Medico thinks this is related to the pt taking Sutent.  Pt stated she will complete this cycle of Sutent next week.  Informed pt that Dr. Burr Medico will be redrawing her labs on 10/25/2021 '@2pm'$ .  If pt's Thyroid functions remains slightly off, Dr. Burr Medico will reduce her Sutent dose.  Pt verbalized understanding.  Instructed pt to please contact Dr. Ernestina Penna office should she develop any new symptoms.  Pt verbalized understanding and had no further questions or concerns. ?

## 2021-10-05 ENCOUNTER — Other Ambulatory Visit: Payer: Medicare Other

## 2021-10-05 ENCOUNTER — Ambulatory Visit: Payer: Medicare Other | Admitting: Hematology

## 2021-10-05 ENCOUNTER — Ambulatory Visit: Payer: Medicare Other

## 2021-10-08 ENCOUNTER — Ambulatory Visit (INDEPENDENT_AMBULATORY_CARE_PROVIDER_SITE_OTHER): Payer: Medicare Other | Admitting: Internal Medicine

## 2021-10-08 ENCOUNTER — Encounter: Payer: Self-pay | Admitting: Internal Medicine

## 2021-10-08 ENCOUNTER — Other Ambulatory Visit: Payer: Self-pay | Admitting: Internal Medicine

## 2021-10-08 VITALS — BP 158/62 | HR 66 | Temp 98.0°F | Ht 62.0 in | Wt 181.2 lb

## 2021-10-08 DIAGNOSIS — E559 Vitamin D deficiency, unspecified: Secondary | ICD-10-CM

## 2021-10-08 DIAGNOSIS — I10 Essential (primary) hypertension: Secondary | ICD-10-CM

## 2021-10-08 DIAGNOSIS — I872 Venous insufficiency (chronic) (peripheral): Secondary | ICD-10-CM | POA: Diagnosis not present

## 2021-10-08 DIAGNOSIS — E1165 Type 2 diabetes mellitus with hyperglycemia: Secondary | ICD-10-CM | POA: Diagnosis not present

## 2021-10-08 DIAGNOSIS — R21 Rash and other nonspecific skin eruption: Secondary | ICD-10-CM | POA: Diagnosis not present

## 2021-10-08 LAB — POCT GLYCOSYLATED HEMOGLOBIN (HGB A1C): Hemoglobin A1C: 5.9 % — AB (ref 4.0–5.6)

## 2021-10-08 MED ORDER — PANTOPRAZOLE SODIUM 40 MG PO TBEC: 40.0000 mg | DELAYED_RELEASE_TABLET | Freq: Two times a day (BID) | ORAL | 0 refills | Status: DC

## 2021-10-08 MED ORDER — HYDROCHLOROTHIAZIDE 25 MG PO TABS: 25.0000 mg | ORAL_TABLET | Freq: Every day | ORAL | 3 refills | Status: DC

## 2021-10-08 MED ORDER — TRIAMCINOLONE ACETONIDE 0.1 % EX CREA: 1.0000 "application " | TOPICAL_CREAM | Freq: Two times a day (BID) | CUTANEOUS | 1 refills | Status: DC

## 2021-10-08 NOTE — Telephone Encounter (Signed)
Please refill as per office routine med refill policy (all routine meds to be refilled for 3 mo or monthly (per pt preference) up to one year from last visit, then month to month grace period for 3 mo, then further med refills will have to be denied) ? ?

## 2021-10-08 NOTE — Patient Instructions (Addendum)
Your A1c test was done today ? ?Ok to increase the HCT to 25 mg per day for BP and swelling ? ?Please take all new medication as prescribed - the steroid cream for the rash ? ?Please continue all other medications as before, and refills have been done if requested. ? ?Please have the pharmacy call with any other refills you may need. ? ?Please keep your appointments with your specialists as you may have planned ? ?Please make an Appointment to return in 6 months, or sooner if needed - ok to CANCEL the June 8 appt if you like ? ? ? ?

## 2021-10-08 NOTE — Assessment & Plan Note (Signed)
Last vitamin D ?Lab Results  ?Component Value Date  ? VD25OH 18.03 (L) 11/15/2020  ? ?Low, to start oral replacement ? ?

## 2021-10-08 NOTE — Progress Notes (Signed)
Patient ID: Bridget Grant, female   DOB: December 10, 1945, 76 y.o.   MRN: [id number]        Chief Complaint: follow up new rash to left distal leg, left > right leg swelling, htn, dm, low vit d       HPI:  Bridget Grant is a 76 y.o. female here with c/o redness x 1 wk to distal LLE plaquelike slight itchy redness area with worsening swelling left > right legs, no fever or pain.  Pt denies chest pain, increased sob or doe, wheezing, orthopnea, PND, palpitations, dizziness or syncope.   Pt denies polydipsia, polyuria, or new focal neuro s/s.   Pt denies fever, wt loss, night sweats, loss of appetite, or other constitutional symptoms         Wt Readings from Last 3 Encounters:  10/08/21 181 lb 3.2 oz (82.2 kg)  10/02/21 181 lb 11.2 oz (82.4 kg)  08/30/21 190 lb 8 oz (86.4 kg)   BP Readings from Last 3 Encounters:  10/08/21 (!) 158/62  10/02/21 (!) 160/66  08/30/21 (!) 157/69         Past Medical History:  Diagnosis Date   Anxiety    Cancer (HCC) 2021   stomach   Complication of anesthesia    Depression    Hyperlipidemia    Hypertension    Migraine    Osteopenia    PONV (postoperative nausea and vomiting)    Type II or unspecified type diabetes mellitus without mention of complication, uncontrolled 04/09/2013   Upper GI bleed 01/13/2020   Past Surgical History:  Procedure Laterality Date   ABDOMINAL HYSTERECTOMY     APPENDECTOMY     BIOPSY  01/13/2020   Procedure: BIOPSY;  Surgeon: Rachael Fee, MD;  Location: [facility] ENDOSCOPY;  Service: Endoscopy;;   BIOPSY  04/13/2020   Procedure: BIOPSY;  Surgeon: Rachael Fee, MD;  Location: [facility] ENDOSCOPY;  Service: Endoscopy;;   BIOPSY  08/09/2021   Procedure: BIOPSY;  Surgeon: Rachael Fee, MD;  Location: [facility] ENDOSCOPY;  Service: Gastroenterology;;   CHOLECYSTECTOMY     ESOPHAGOGASTRODUODENOSCOPY N/A 08/09/2021   Procedure: ESOPHAGOGASTRODUODENOSCOPY (EGD);  Surgeon: Rachael Fee, MD;  Location: [facility] ENDOSCOPY;  Service: Gastroenterology;   Laterality: N/A;   ESOPHAGOGASTRODUODENOSCOPY (EGD) WITH PROPOFOL N/A 01/13/2020   Procedure: ESOPHAGOGASTRODUODENOSCOPY (EGD) WITH PROPOFOL;  Surgeon: Rachael Fee, MD;  Location: [facility] ENDOSCOPY;  Service: Endoscopy;  Laterality: N/A;   ESOPHAGOGASTRODUODENOSCOPY (EGD) WITH PROPOFOL N/A 04/13/2020   Procedure: ESOPHAGOGASTRODUODENOSCOPY (EGD) WITH PROPOFOL;  Surgeon: Rachael Fee, MD;  Location: [facility] ENDOSCOPY;  Service: Endoscopy;  Laterality: N/A;   ESOPHAGOGASTRODUODENOSCOPY (EGD) WITH PROPOFOL N/A 10/12/2020   Procedure: ESOPHAGOGASTRODUODENOSCOPY (EGD) WITH PROPOFOL;  Surgeon: Rachael Fee, MD;  Location: [facility] ENDOSCOPY;  Service: Endoscopy;  Laterality: N/A;   EUS N/A 04/13/2020   Procedure: UPPER ENDOSCOPIC ULTRASOUND (EUS) LINEAR;  Surgeon: Rachael Fee, MD;  Location: [facility] ENDOSCOPY;  Service: Endoscopy;  Laterality: N/A;   EUS N/A 10/12/2020   Procedure: UPPER ENDOSCOPIC ULTRASOUND (EUS) RADIAL;  Surgeon: Rachael Fee, MD;  Location: [facility] ENDOSCOPY;  Service: Endoscopy;  Laterality: N/A;   EUS N/A 08/09/2021   Procedure: UPPER ENDOSCOPIC ULTRASOUND (EUS) RADIAL;  Surgeon: Rachael Fee, MD;  Location: [facility] ENDOSCOPY;  Service: Gastroenterology;  Laterality: N/A;   FINE NEEDLE ASPIRATION N/A 08/09/2021   Procedure: FINE NEEDLE ASPIRATION (FNA) LINEAR;  Surgeon: Rachael Fee, MD;  Location: [facility] ENDOSCOPY;  Service: Gastroenterology;  Laterality: N/A;   PARTIAL GASTRECTOMY  02/28/2020    reports that she has never smoked. She has never used smokeless tobacco. She reports that she does not drink alcohol and does not use drugs. family history includes Breast cancer (age of onset: 29) in her mother; Cancer in her maternal grandmother; Cancer (age of onset: 11) in her mother; Hypertension in her father. No Known Allergies Current Outpatient Medications on File Prior to Visit  Medication Sig Dispense Refill   acetaminophen (TYLENOL) 500 MG tablet Take 1,000 mg by mouth every 6 (six) hours  as needed for moderate pain.     atorvastatin (LIPITOR) 20 MG tablet TAKE 1 TABLET BY MOUTH EVERY DAY 90 tablet 3   diltiazem (CARDIZEM CD) 240 MG 24 hr capsule TAKE 1 CAPSULE(240 MG) BY MOUTH DAILY 90 capsule 3   Homeopathic Products (THERAWORX RELIEF EX) Apply 1 application topically daily as needed (cramping).     metFORMIN (GLUCOPHAGE-XR) 500 MG 24 hr tablet Take 2 tablets (1,000 mg total) by mouth daily with breakfast. 180 tablet 3   ondansetron (ZOFRAN ODT) 8 MG disintegrating tablet Take 1 tablet (8 mg total) by mouth every 8 (eight) hours as needed for nausea or vomiting. 30 tablet 1   OVER THE COUNTER MEDICATION Take 1 capsule by mouth daily. Super Beets otc supplement     Prenatal Vit-Fe Fumarate-FA (PRENATAL PO) Take 3 tablets by mouth daily.     SUNItinib (SUTENT) 37.5 MG capsule Take 1 capsule (37.5 mg total) by mouth daily. 30 capsule 1   trolamine salicylate (ASPERCREME) 10 % cream Apply 1 application topically as needed for muscle pain.     [DISCONTINUED] diltiazem (TIAZAC) 240 MG 24 hr capsule Take 1 capsule (240 mg total) by mouth daily. 90 capsule 3   No current facility-administered medications on file prior to visit.        ROS:  All others reviewed and negative.  Objective        PE:  BP (!) 158/62 (BP Location: Left Arm, Patient Position: Sitting, Cuff Size: Large)   Pulse 66   Temp 98 F (36.7 C) (Oral)   Ht 5\' 2"  (1.575 m)   Wt 181 lb 3.2 oz (82.2 kg)   SpO2 98%   BMI 33.14 kg/m                 Constitutional: Pt appears in NAD               HENT: Head: NCAT.                Right Ear: External ear normal.                 Left Ear: External ear normal.                Eyes: . Pupils are equal, round, and reactive to light. Conjunctivae and EOM are normal               Nose: without d/c or deformity               Neck: Neck supple. Gross normal ROM               Cardiovascular: Normal rate and regular rhythm.                 Pulmonary/Chest: Effort normal and  breath sounds without rales or wheezing.                Abd:  Soft, NT, ND, +  BS, no organomegaly               Neurological: Pt is alert. At baseline orientation, motor grossly intact               Skin: LE edema - trace to 1+ left > right, with 6 x 3 cm area non tender erythema to distal LLE just prox to left ankle                Psychiatric: Pt behavior is normal without agitation   Micro: none  Cardiac tracings I have personally interpreted today:  none  Pertinent Radiological findings (summarize): none   Lab Results  Component Value Date   WBC 4.6 10/02/2021   HGB 11.8 (L) 10/02/2021   HCT 34.2 (L) 10/02/2021   PLT 145 (L) 10/02/2021   GLUCOSE 117 (H) 10/02/2021   CHOL 174 11/15/2020   TRIG 263.0 (H) 11/15/2020   HDL 49.50 11/15/2020   LDLDIRECT 100.0 11/15/2020   LDLCALC 62 11/16/2019   ALT 39 10/02/2021   AST 40 10/02/2021   NA 141 10/02/2021   K 3.3 (L) 10/02/2021   CL 101 10/02/2021   CREATININE 0.75 10/02/2021   BUN 11 10/02/2021   CO2 33 (H) 10/02/2021   TSH 16.076 (H) 10/02/2021   INR 0.9 01/11/2020   HGBA1C 5.9 (A) 10/08/2021   MICROALBUR 1.1 11/16/2019   Hemoglobin A1C 4.0 - 5.6 % 5.9 Abnormal   6.6 Abnormal   6.4 R, CM  5.9 Abnormal    Assessment/Plan:  Bridget Grant is a 76 y.o. White or Caucasian [1] female with  has a past medical history of Anxiety, Cancer (HCC) (2021), Complication of anesthesia, Depression, Hyperlipidemia, Hypertension, Migraine, Osteopenia, PONV (postoperative nausea and vomiting), Type II or unspecified type diabetes mellitus without mention of complication, uncontrolled (04/09/2013), and Upper GI bleed (01/13/2020).  Vitamin D deficiency Last vitamin D Lab Results  Component Value Date   VD25OH 18.03 (L) 11/15/2020   Low, to start oral replacement   Essential hypertension BP Readings from Last 3 Encounters:  10/08/21 (!) 158/62  10/02/21 (!) 160/66  08/30/21 (!) 157/69   Uncontrolled. To increase the HCT to 25  mg   Venous (peripheral) insufficiency For increased hct as above, also leg elevation, low salt diet, wt control, compression stocking  Diabetes (HCC) Lab Results  Component Value Date   HGBA1C 5.9 (A) 10/08/2021   Stable, pt to continue current medical treatment metformin   Rash and nonspecific skin eruption Likely possible venous stasis dermatitis, ok for trial triam cr prn Followup: No follow-ups on file.  Oliver Barre, MD 10/09/2021 9:11 PM [facility]

## 2021-10-09 ENCOUNTER — Telehealth: Payer: Self-pay

## 2021-10-09 DIAGNOSIS — R21 Rash and other nonspecific skin eruption: Secondary | ICD-10-CM | POA: Insufficient documentation

## 2021-10-09 NOTE — Assessment & Plan Note (Signed)
BP Readings from Last 3 Encounters:  ?10/08/21 (!) 158/62  ?10/02/21 (!) 160/66  ?08/30/21 (!) 157/69  ? ?Uncontrolled. To increase the HCT to 25 mg ? ?

## 2021-10-09 NOTE — Assessment & Plan Note (Signed)
Lab Results  ?Component Value Date  ? HGBA1C 5.9 (A) 10/08/2021  ? ?Stable, pt to continue current medical treatment metformin ? ?

## 2021-10-09 NOTE — Telephone Encounter (Signed)
Pt LVM to inform Dr.Feng that she had a skin flare-up on her left lower leg near her ankle.  Pt stated she saw Dr. Cathlean Cower and he prescribed her medication for her breakout.  Pt wanted Dr. Ernestina Penna office to be aware. ?

## 2021-10-09 NOTE — Assessment & Plan Note (Signed)
Likely possible venous stasis dermatitis, ok for trial triam cr prn ?

## 2021-10-09 NOTE — Assessment & Plan Note (Addendum)
For increased hct as above, also leg elevation, low salt diet, wt control, compression stocking ?

## 2021-10-16 ENCOUNTER — Other Ambulatory Visit: Payer: Self-pay

## 2021-10-16 MED ORDER — SUNITINIB MALATE 37.5 MG PO CAPS: 37.5000 mg | ORAL_CAPSULE | Freq: Every day | ORAL | 1 refills | Status: DC

## 2021-10-16 NOTE — Progress Notes (Signed)
Spoke with Roderic Palau with [facility] Prior Authorization Dept [phone]) regarding pt's Sutent.  Pt's Sutent required PA when Dr. Burr Medico initially reduced pt's dose from '50mg'$ /day to 37.'5mg'$ /day.  Dr. Ernestina Penna office was never notified by [facility] that the medication required a PA.  New refill was sent today to [facility] and PA was done.  Roderic Palau said the PA was approved and will take 24hrs to upload into their system.  Notified pt of the PA approval.  Pt stated she was supposed to restart her Sutent today at the 37.'5mg'$  dose but does not have any medication.  Pt stated she will contact [facility] on 10/17/2021 to confirm shipping arrangements.   ?

## 2021-10-18 NOTE — Progress Notes (Signed)
Spoke with Bethena Roys with [facility] Prior Authorization Dept [phone]) regarding pt's Sutent.   She verified PA on file for  Sutent 37.5 mg approved until 10/17/2022.  New refill was sent today to [facility] and PA was done.  Bethena Roys transferred this nurse to Rep named Tonette with [facility] who states that the medication order can now be placed.  Rep called patient and set up delivery with the patient. No further questions or concerns at this time.   ?

## 2021-10-25 ENCOUNTER — Inpatient Hospital Stay: Payer: Medicare Other | Attending: Hematology

## 2021-10-25 ENCOUNTER — Other Ambulatory Visit: Payer: Self-pay

## 2021-10-25 DIAGNOSIS — D500 Iron deficiency anemia secondary to blood loss (chronic): Secondary | ICD-10-CM

## 2021-10-25 DIAGNOSIS — C49A2 Gastrointestinal stromal tumor of stomach: Secondary | ICD-10-CM | POA: Insufficient documentation

## 2021-10-25 DIAGNOSIS — Z79899 Other long term (current) drug therapy: Secondary | ICD-10-CM | POA: Diagnosis not present

## 2021-10-25 DIAGNOSIS — Z1329 Encounter for screening for other suspected endocrine disorder: Secondary | ICD-10-CM

## 2021-10-25 LAB — CBC WITH DIFFERENTIAL (CANCER CENTER ONLY)
Abs Immature Granulocytes: 0.05 10*3/uL (ref 0.00–0.07)
Basophils Absolute: 0 10*3/uL (ref 0.0–0.1)
Basophils Relative: 1 %
Eosinophils Absolute: 0.2 10*3/uL (ref 0.0–0.5)
Eosinophils Relative: 3 %
HCT: 32.3 % — ABNORMAL LOW (ref 36.0–46.0)
Hemoglobin: 11.5 g/dL — ABNORMAL LOW (ref 12.0–15.0)
Immature Granulocytes: 1 %
Lymphocytes Relative: 34 %
Lymphs Abs: 1.6 10*3/uL (ref 0.7–4.0)
MCH: 37.3 pg — ABNORMAL HIGH (ref 26.0–34.0)
MCHC: 35.6 g/dL (ref 30.0–36.0)
MCV: 104.9 fL — ABNORMAL HIGH (ref 80.0–100.0)
Monocytes Absolute: 0.6 10*3/uL (ref 0.1–1.0)
Monocytes Relative: 12 %
Neutro Abs: 2.4 10*3/uL (ref 1.7–7.7)
Neutrophils Relative %: 49 %
Platelet Count: 172 10*3/uL (ref 150–400)
RBC: 3.08 MIL/uL — ABNORMAL LOW (ref 3.87–5.11)
RDW: 16.4 % — ABNORMAL HIGH (ref 11.5–15.5)
WBC Count: 4.8 10*3/uL (ref 4.0–10.5)
nRBC: 0 % (ref 0.0–0.2)

## 2021-10-25 LAB — RETIC PANEL
Immature Retic Fract: 27 % — ABNORMAL HIGH (ref 2.3–15.9)
RBC.: 3.06 MIL/uL — ABNORMAL LOW (ref 3.87–5.11)
Retic Count, Absolute: 63 10*3/uL (ref 19.0–186.0)
Retic Ct Pct: 2.1 % (ref 0.4–3.1)
Reticulocyte Hemoglobin: 39.7 pg (ref >27.9)

## 2021-10-25 LAB — CMP (CANCER CENTER ONLY)
ALT: 19 U/L (ref 0–44)
AST: 22 U/L (ref 15–41)
Albumin: 4.3 g/dL (ref 3.5–5.0)
Alkaline Phosphatase: 66 U/L (ref 38–126)
Anion gap: 7 (ref 5–15)
BUN: 16 mg/dL (ref 8–23)
CO2: 29 mmol/L (ref 22–32)
Calcium: 9.4 mg/dL (ref 8.9–10.3)
Chloride: 103 mmol/L (ref 98–111)
Creatinine: 0.92 mg/dL (ref 0.44–1.00)
GFR, Estimated: >60 mL/min (ref >60)
Glucose, Bld: 142 mg/dL — ABNORMAL HIGH (ref 70–99)
Potassium: 3.9 mmol/L (ref 3.5–5.1)
Sodium: 139 mmol/L (ref 135–145)
Total Bilirubin: 0.5 mg/dL (ref 0.3–1.2)
Total Protein: 7.6 g/dL (ref 6.5–8.1)

## 2021-10-25 LAB — FERRITIN: Ferritin: 325 ng/mL — ABNORMAL HIGH (ref 11–307)

## 2021-10-25 LAB — TSH: TSH: 3.255 u[IU]/mL (ref 0.350–4.500)

## 2021-10-25 LAB — T4, FREE: Free T4: 0.8 ng/dL (ref 0.61–1.12)

## 2021-11-15 ENCOUNTER — Ambulatory Visit: Payer: Medicare Other | Admitting: Internal Medicine

## 2021-11-20 ENCOUNTER — Telehealth: Payer: Self-pay | Admitting: Hematology

## 2021-11-20 NOTE — Telephone Encounter (Signed)
Patient called to reschedule upcoming appointment. Patient is aware of changes. 

## 2021-11-22 ENCOUNTER — Inpatient Hospital Stay: Payer: Medicare Other

## 2021-11-22 ENCOUNTER — Inpatient Hospital Stay: Payer: Medicare Other | Admitting: Hematology

## 2021-11-26 ENCOUNTER — Other Ambulatory Visit: Payer: Self-pay | Admitting: Hematology

## 2021-12-05 ENCOUNTER — Ambulatory Visit (INDEPENDENT_AMBULATORY_CARE_PROVIDER_SITE_OTHER): Payer: Medicare Other

## 2021-12-05 DIAGNOSIS — Z0000 Encounter for general adult medical examination without abnormal findings: Secondary | ICD-10-CM

## 2021-12-05 NOTE — Progress Notes (Signed)
I connected with Bridget Grant today by telephone and verified that I am speaking with the correct person using two identifiers. Location patient: home Location provider: work Persons participating in the virtual visit: patient, provider.   I discussed the limitations, risks, security and privacy concerns of performing an evaluation and management service by telephone and the availability of in person appointments. I also discussed with the patient that there may be a patient responsible charge related to this service. The patient expressed understanding and verbally consented to this telephonic visit.    Interactive audio and video telecommunications were attempted between this provider and patient, however failed, due to patient having technical difficulties OR patient did not have access to video capability.  We continued and completed visit with audio only.  Some vital signs may be absent or patient reported.   Time Spent with patient on telephone encounter: 30 minutes  Subjective:   Bridget Grant is a 76 y.o. female who presents for Medicare Annual (Subsequent) preventive examination.  Review of Systems     Cardiac Risk Factors include: advanced age (>102mn, >>58women);diabetes mellitus;dyslipidemia;hypertension;family history of premature cardiovascular disease;obesity (BMI >30kg/m2)     Objective:    There were no vitals filed for this visit. There is no height or weight on file to calculate BMI.     12/05/2021    3:39 PM 08/09/2021   10:19 AM 10/12/2020    6:48 AM 04/13/2020    8:46 AM 03/01/2020    8:43 AM 02/23/2020   11:00 AM 02/09/2020    8:51 AM  Advanced Directives  Does Patient Have a Medical Advance Directive? No No No No No No No  Would patient like information on creating a medical advance directive? No - Patient declined No - Patient declined No - Patient declined  No - Patient declined No - Patient declined No - Patient declined    Current Medications  (verified) Outpatient Encounter Medications as of 12/05/2021  Medication Sig   acetaminophen (TYLENOL) 500 MG tablet Take 1,000 mg by mouth every 6 (six) hours as needed for moderate pain.   atorvastatin (LIPITOR) 20 MG tablet TAKE 1 TABLET BY MOUTH EVERY DAY   diltiazem (CARDIZEM CD) 240 MG 24 hr capsule TAKE 1 CAPSULE(240 MG) BY MOUTH DAILY   Homeopathic Products (THERAWORX RELIEF EX) Apply 1 application topically daily as needed (cramping).   hydrochlorothiazide (HYDRODIURIL) 25 MG tablet Take 1 tablet (25 mg total) by mouth daily.   lisinopril (ZESTRIL) 20 MG tablet TAKE 1 TABLET BY MOUTH EVERY DAY   metFORMIN (GLUCOPHAGE-XR) 500 MG 24 hr tablet Take 2 tablets (1,000 mg total) by mouth daily with breakfast.   ondansetron (ZOFRAN ODT) 8 MG disintegrating tablet Take 1 tablet (8 mg total) by mouth every 8 (eight) hours as needed for nausea or vomiting.   OVER THE COUNTER MEDICATION Take 1 capsule by mouth daily. Super Beets otc supplement   pantoprazole (PROTONIX) 40 MG tablet Take 1 tablet (40 mg total) by mouth 2 (two) times daily before a meal. TAKE 1 TABLET(40 MG) BY MOUTH TWICE DAILY BEFORE A MEAL   Prenatal Vit-Fe Fumarate-FA (PRENATAL PO) Take 3 tablets by mouth daily.   SUNItinib (SUTENT) 37.5 MG capsule TAKE 1 CAPSULE BY MOUTH 1 TIME A DAY   triamcinolone cream (KENALOG) 0.1 % Apply 1 application. topically 2 (two) times daily.   trolamine salicylate (ASPERCREME) 10 % cream Apply 1 application topically as needed for muscle pain.   [DISCONTINUED] diltiazem (TIAZAC) 240 MG  24 hr capsule Take 1 capsule (240 mg total) by mouth daily.   No facility-administered encounter medications on file as of 12/05/2021.    Allergies (verified) Patient has no known allergies.   History: Past Medical History:  Diagnosis Date   Anxiety    Cancer ([place]) 2021   stomach   Complication of anesthesia    Depression    Hyperlipidemia    Hypertension    Migraine    Osteopenia    PONV (postoperative  nausea and vomiting)    Type II or unspecified type diabetes mellitus without mention of complication, uncontrolled 04/09/2013   Upper GI bleed 01/13/2020   Past Surgical History:  Procedure Laterality Date   ABDOMINAL HYSTERECTOMY     APPENDECTOMY     BIOPSY  01/13/2020   Procedure: BIOPSY;  Surgeon: Milus Banister, MD;  Location: [facility] ENDOSCOPY;  Service: Endoscopy;;   BIOPSY  04/13/2020   Procedure: BIOPSY;  Surgeon: Milus Banister, MD;  Location: [facility] ENDOSCOPY;  Service: Endoscopy;;   BIOPSY  08/09/2021   Procedure: BIOPSY;  Surgeon: Milus Banister, MD;  Location: [facility] ENDOSCOPY;  Service: Gastroenterology;;   CHOLECYSTECTOMY     ESOPHAGOGASTRODUODENOSCOPY N/A 08/09/2021   Procedure: ESOPHAGOGASTRODUODENOSCOPY (EGD);  Surgeon: Milus Banister, MD;  Location: [facility] ENDOSCOPY;  Service: Gastroenterology;  Laterality: N/A;   ESOPHAGOGASTRODUODENOSCOPY (EGD) WITH PROPOFOL N/A 01/13/2020   Procedure: ESOPHAGOGASTRODUODENOSCOPY (EGD) WITH PROPOFOL;  Surgeon: Milus Banister, MD;  Location: [facility] ENDOSCOPY;  Service: Endoscopy;  Laterality: N/A;   ESOPHAGOGASTRODUODENOSCOPY (EGD) WITH PROPOFOL N/A 04/13/2020   Procedure: ESOPHAGOGASTRODUODENOSCOPY (EGD) WITH PROPOFOL;  Surgeon: Milus Banister, MD;  Location: [facility] ENDOSCOPY;  Service: Endoscopy;  Laterality: N/A;   ESOPHAGOGASTRODUODENOSCOPY (EGD) WITH PROPOFOL N/A 10/12/2020   Procedure: ESOPHAGOGASTRODUODENOSCOPY (EGD) WITH PROPOFOL;  Surgeon: Milus Banister, MD;  Location: [facility] ENDOSCOPY;  Service: Endoscopy;  Laterality: N/A;   EUS N/A 04/13/2020   Procedure: UPPER ENDOSCOPIC ULTRASOUND (EUS) LINEAR;  Surgeon: Milus Banister, MD;  Location: [facility] ENDOSCOPY;  Service: Endoscopy;  Laterality: N/A;   EUS N/A 10/12/2020   Procedure: UPPER ENDOSCOPIC ULTRASOUND (EUS) RADIAL;  Surgeon: Milus Banister, MD;  Location: [facility] ENDOSCOPY;  Service: Endoscopy;  Laterality: N/A;   EUS N/A 08/09/2021   Procedure: UPPER ENDOSCOPIC ULTRASOUND (EUS) RADIAL;  Surgeon: Milus Banister, MD;  Location: [facility] ENDOSCOPY;  Service: Gastroenterology;  Laterality: N/A;   FINE NEEDLE ASPIRATION N/A 08/09/2021   Procedure: FINE NEEDLE ASPIRATION (FNA) LINEAR;  Surgeon: Milus Banister, MD;  Location: [facility] ENDOSCOPY;  Service: Gastroenterology;  Laterality: N/A;   PARTIAL GASTRECTOMY  02/28/2020   Family History  Problem Relation Age of Onset   Cancer Mother 86       breast cancer and ovarian cancer    Breast cancer Mother 15   Hypertension Father    Cancer Maternal Grandmother        unknown type of cancer    Social History   Socioeconomic History   Marital status: Married    Spouse name: Not on file   Number of children: 0   Years of education: Not on file   Highest education level: Not on file  Occupational History   Occupation: retired   Tobacco Use   Smoking status: Never   Smokeless tobacco: Never  Vaping Use   Vaping Use: Never used  Substance and Sexual Activity   Alcohol use: No   Drug use: No   Sexual activity: Not on file  Other Topics Concern  Not on file  Social History Narrative   Not on file   Social Determinants of Health   Financial Resource Strain: Low Risk  (12/05/2021)   Overall Financial Resource Strain (CARDIA)    Difficulty of Paying Living Expenses: Not hard at all  Food Insecurity: No Food Insecurity (12/05/2021)   Hunger Vital Sign    Worried About Running Out of Food in the Last Year: Never true    Ran Out of Food in the Last Year: Never true  Transportation Needs: No Transportation Needs (12/05/2021)   PRAPARE - Hydrologist (Medical): No    Lack of Transportation (Non-Medical): No  Physical Activity: Sufficiently Active (12/05/2021)   Exercise Vital Sign    Days of Exercise per Week: 5 days    Minutes of Exercise per Session: 30 min  Stress: No Stress Concern Present (12/05/2021)   [place]    Feeling of Stress : Not at all   Social Connections: Unknown (12/05/2021)   Social Connection and Isolation Panel [NHANES]    Frequency of Communication with Friends and Family: More than three times a week    Frequency of Social Gatherings with Friends and Family: More than three times a week    Attends Religious Services: Patient refused    Marine scientist or Organizations: Patient refused    Attends Music therapist: Patient refused    Marital Status: Married    Tobacco Counseling Counseling given: Not Answered   Clinical Intake:  Pre-visit preparation completed: Yes  Pain : No/denies pain     BMI - recorded: 33.14 Nutritional Status: BMI > 30  Obese Nutritional Risks: None Diabetes: Yes CBG done?: No Did pt. bring in CBG monitor from home?: No  How often do you need to have someone help you when you read instructions, pamphlets, or other written materials from your doctor or pharmacy?: 1 - Never What is the last grade level you completed in school?: HSG  Diabetic? yes  Interpreter Needed?: No  Information entered by :: Lisette Abu, LPN.   Activities of Daily Living    12/05/2021    3:41 PM 05/17/2021    2:12 PM  In your present state of health, do you have any difficulty performing the following activities:  Hearing? 0 0  Vision? 0 0  Difficulty concentrating or making decisions? 0 0  Walking or climbing stairs? 0 0  Dressing or bathing? 0 0  Doing errands, shopping? 0 0  Preparing Food and eating ? N   Using the Toilet? N   In the past six months, have you accidently leaked urine? N   Do you have problems with loss of bowel control? N   Managing your Medications? N   Managing your Finances? N   Housekeeping or managing your Housekeeping? N     Patient Care Team: [name], MD as PCP - Charissa Bash, MD as Consulting Physician (Oncology) Jonnie Finner, RN (Inactive) as Oncology Nurse Navigator Stark Klein, MD as Consulting Physician (General  Surgery) Milus Banister, MD as Attending Physician (Gastroenterology)  Indicate any recent Medical Services you may have received from other than [facility] providers in the past year (date may be approximate).     Assessment:   This is a routine wellness examination for [place].  Hearing/Vision screen Hearing Screening - Comments:: Patient denied any hearing difficulty.   No hearing aids.  Vision Screening -  Comments:: Patient does wears readers for fine print.     Dietary issues and exercise activities discussed: Current Exercise Habits: Home exercise routine, Type of exercise: walking, Time (Minutes): 30, Frequency (Times/Week): 5, Weekly Exercise (Minutes/Week): 150, Intensity: Moderate, Exercise limited by: orthopedic condition(s)   Goals Addressed             This Visit's Progress    Just to live each day.        Depression Screen    12/05/2021    3:41 PM 10/08/2021    2:41 PM 11/15/2020    3:38 PM 05/17/2020    2:56 PM 11/16/2019    2:45 PM 11/16/2019    2:14 PM 10/26/2018    1:14 PM  PHQ 2/9 Scores  PHQ - 2 Score 0 0 0 1 0 0 0  PHQ- 9 Score  1         Fall Risk    12/05/2021    3:40 PM 10/08/2021    2:40 PM 11/15/2020    3:38 PM 05/17/2020    2:56 PM 11/16/2019    2:45 PM  [place] in the past year? 1 1 0 0 0  Number falls in past yr: 0 0 0 0   Injury with Fall? 1 1 0 0   Follow up Falls evaluation completed        FALL RISK PREVENTION PERTAINING TO THE HOME:  Any stairs in or around the home? No  If so, are there any without handrails? No  Home free of loose throw rugs in walkways, pet beds, electrical cords, etc? Yes  Adequate lighting in your home to reduce risk of falls? Yes   ASSISTIVE DEVICES UTILIZED TO PREVENT FALLS:  Life alert? No  Use of a cane, walker or w/c? No  Grab bars in the bathroom? Yes  Shower chair or bench in shower? Yes  Elevated toilet seat or a handicapped toilet? No   TIMED UP AND GO:  Was the test performed? No .  Length  of time to ambulate 10 feet: n/a sec.   Appearance of gait: Patient not evaluated for gait during this visit.  Cognitive Function:        12/05/2021    3:45 PM 11/16/2019    3:04 PM  6CIT Screen  What Year? 0 points 0 points  What month? 0 points 0 points  What time? 0 points 0 points  Count back from 20 0 points 0 points  Months in reverse 0 points 0 points  Repeat phrase 0 points 0 points  Total Score 0 points 0 points    Immunizations Immunization History  Administered Date(s) Administered   Fluad Quad(high Dose 65+) 03/04/2020, 02/19/2021   Influenza Split 03/04/2011, 03/03/2012   Influenza Whole 03/08/2009, 03/07/2010   Influenza, High Dose Seasonal PF 04/06/2013, 04/23/2017, 02/02/2019   Influenza,inj,Quad PF,6+ Mos 02/24/2014, 04/14/2015   Influenza-Unspecified 02/03/2018   PFIZER(Purple Top)SARS-COV-2 Vaccination 06/11/2019, 07/05/2019   Pneumococcal Conjugate-13 04/13/2014   Pneumococcal Polysaccharide-23 05/11/2019   Td 08/03/2008   Tdap 10/26/2018   Zoster Recombinat (Shingrix) 10/31/2017, 01/23/2018, 03/25/2018   Zoster, Live 08/03/2008    TDAP status: Up to date  Flu Vaccine status: Up to date  Pneumococcal vaccine status: Up to date  Covid-19 vaccine status: Completed vaccines  Qualifies for Shingles Vaccine? Yes   Zostavax completed Yes   Shingrix Completed?: Yes  Screening Tests Health Maintenance  Topic Date Due   OPHTHALMOLOGY EXAM  Never done  COLONOSCOPY (Pts 45-60yr Insurance coverage will need to be confirmed)  Never done   COVID-19 Vaccine (3 - Pfizer risk series) 08/02/2019   FOOT EXAM  11/15/2021   INFLUENZA VACCINE  01/08/2022   MAMMOGRAM  02/06/2022   HEMOGLOBIN A1C  04/10/2022   TETANUS/TDAP  10/25/2028   Pneumonia Vaccine 75 Years old  Completed   DEXA SCAN  Completed   Hepatitis C Screening  Completed   Zoster Vaccines- Shingrix  Completed   HPV VACCINES  Aged Out    Health Maintenance  Health Maintenance Due  Topic  Date Due   OPHTHALMOLOGY EXAM  Never done   COLONOSCOPY (Pts 45-48yrInsurance coverage will need to be confirmed)  Never done   COVID-19 Vaccine (3 - Pfizer risk series) 08/02/2019   FOOT EXAM  11/15/2021    Colorectal cancer screening: No longer required.  (Patient declined)  Mammogram status: Completed 02/07/2020. Repeat every year  Bone Density status: Completed 10/23/2016. Results reflect: Bone density results: OSTEOPENIA. Repeat every 2-3 years.  Lung Cancer Screening: (Low Dose CT Chest recommended if Age 75-80ears, 30 pack-year currently smoking OR have quit w/in 15years.) does not qualify.   Lung Cancer Screening Referral: no  Additional Screening:  Hepatitis C Screening: does qualify; Completed 04/14/2015  Vision Screening: Recommended annual ophthalmology exams for early detection of glaucoma and other disorders of the eye. Is the patient up to date with their annual eye exam?  No  Who is the provider or what is the name of the office in which the patient attends annual eye exams? Patient declined/no eye doctor If pt is not established with a provider, would they like to be referred to a provider to establish care? No .   Dental Screening: Recommended annual dental exams for proper oral hygiene  Community Resource Referral / Chronic Care Management: CRR required this visit?  No   CCM required this visit?  No      Plan:     I have personally reviewed and noted the following in the patient's chart:   Medical and social history Use of alcohol, tobacco or illicit drugs  Current medications and supplements including opioid prescriptions.  Functional ability and status Nutritional status Physical activity Advanced directives List of other physicians Hospitalizations, surgeries, and ER visits in previous 12 months Vitals Screenings to include cognitive, depression, and falls Referrals and appointments  In addition, I have reviewed and discussed with patient  certain preventive protocols, quality metrics, and best practice recommendations. A written personalized care plan for preventive services as well as general preventive health recommendations were provided to patient.     ShSheral FlowLPN   11/11/12/2395 Nurse Notes:  There were no vitals filed for this visit. There is no height or weight on file to calculate BMI. Patient stated that she has no issues with gait or balance; does not use any assistive devices.

## 2021-12-05 NOTE — Patient Instructions (Signed)
Bridget Grant , Thank you for taking time to come for your Medicare Wellness Visit. I appreciate your ongoing commitment to your health goals. Please review the following plan we discussed and let me know if I can assist you in the future.   Screening recommendations/referrals: Colonoscopy: declined Mammogram: 02/07/2020; due every 1-2 years Bone Density: 10/23/2016; due every 2-3 years Recommended yearly ophthalmology/optometry visit for glaucoma screening and checkup Recommended yearly dental visit for hygiene and checkup  Vaccinations: Influenza vaccine: 02/19/2021 Pneumococcal vaccine: 04/13/2014, 05/11/2019 Tdap vaccine: 10/26/2018; due every 10 years Shingles vaccine: 01/23/2018, 03/25/2018   Covid-19: 06/11/2019, 07/05/2019  Advanced directives: No  Conditions/risks identified: Yes; Client understands the importance of follow-up appointments with providers by attending scheduled visits and discussed goals to eat healthier, increase physical activity 5 times a week for 30 minutes each, exercise the brain by doing stimulating brain exercises (reading, adult coloring, crafting, listening to music, puzzles, etc.), socialize and enjoy life more, get enough sleep at least 8-9 hours average per night and make time for laughter.   Next appointment: Please schedule your next Medicare Wellness Visit with your Nurse Health Advisor in 1 year by calling [phone].   Preventive Care 35 Years and Older, Female Preventive care refers to lifestyle choices and visits with your health care provider that can promote health and wellness. What does preventive care include? A yearly physical exam. This is also called an annual well check. Dental exams once or twice a year. Routine eye exams. Ask your health care provider how often you should have your eyes checked. Personal lifestyle choices, including: Daily care of your teeth and gums. Regular physical activity. Eating a healthy diet. Avoiding tobacco and  drug use. Limiting alcohol use. Practicing safe sex. Taking low-dose aspirin every day. Taking vitamin and mineral supplements as recommended by your health care provider. What happens during an annual well check? The services and screenings done by your health care provider during your annual well check will depend on your age, overall health, lifestyle risk factors, and family history of disease. Counseling  Your health care provider may ask you questions about your: Alcohol use. Tobacco use. Drug use. Emotional well-being. Home and relationship well-being. Sexual activity. Eating habits. History of falls. Memory and ability to understand (cognition). Work and work Statistician. Reproductive health. Screening  You may have the following tests or measurements: Height, weight, and BMI. Blood pressure. Lipid and cholesterol levels. These may be checked every 5 years, or more frequently if you are over 46 years old. Skin check. Lung cancer screening. You may have this screening every year starting at age 73 if you have a 30-pack-year history of smoking and currently smoke or have quit within the past 15 years. Fecal occult blood test (FOBT) of the stool. You may have this test every year starting at age 7. Flexible sigmoidoscopy or colonoscopy. You may have a sigmoidoscopy every 5 years or a colonoscopy every 10 years starting at age 9. Hepatitis C blood test. Hepatitis B blood test. Sexually transmitted disease (STD) testing. Diabetes screening. This is done by checking your blood sugar (glucose) after you have not eaten for a while (fasting). You may have this done every 1-3 years. Bone density scan. This is done to screen for osteoporosis. You may have this done starting at age 60. Mammogram. This may be done every 1-2 years. Talk to your health care provider about how often you should have regular mammograms. Talk with your health care provider about your test  results, treatment  options, and if necessary, the need for more tests. Vaccines  Your health care provider may recommend certain vaccines, such as: Influenza vaccine. This is recommended every year. Tetanus, diphtheria, and acellular pertussis (Tdap, Td) vaccine. You may need a Td booster every 10 years. Zoster vaccine. You may need this after age 80. Pneumococcal 13-valent conjugate (PCV13) vaccine. One dose is recommended after age 51. Pneumococcal polysaccharide (PPSV23) vaccine. One dose is recommended after age 69. Talk to your health care provider about which screenings and vaccines you need and how often you need them. This information is not intended to replace advice given to you by your health care provider. Make sure you discuss any questions you have with your health care provider. Document Released: 06/23/2015 Document Revised: 02/14/2016 Document Reviewed: 03/28/2015 Elsevier Interactive Patient Education  2017 [place] Prevention in the Home Falls can cause injuries. They can happen to people of all ages. There are many things you can do to make your home safe and to help prevent falls. What can I do on the outside of my home? Regularly fix the edges of walkways and driveways and fix any cracks. Remove anything that might make you trip as you walk through a door, such as a raised step or threshold. Trim any bushes or trees on the path to your home. Use bright outdoor lighting. Clear any walking paths of anything that might make someone trip, such as rocks or tools. Regularly check to see if handrails are loose or broken. Make sure that both sides of any steps have handrails. Any raised decks and porches should have guardrails on the edges. Have any leaves, snow, or ice cleared regularly. Use sand or salt on walking paths during winter. Clean up any spills in your garage right away. This includes oil or grease spills. What can I do in the bathroom? Use night lights. Install grab  bars by the toilet and in the tub and shower. Do not use towel bars as grab bars. Use non-skid mats or decals in the tub or shower. If you need to sit down in the shower, use a plastic, non-slip stool. Keep the floor dry. Clean up any water that spills on the floor as soon as it happens. Remove soap buildup in the tub or shower regularly. Attach bath mats securely with double-sided non-slip rug tape. Do not have throw rugs and other things on the floor that can make you trip. What can I do in the bedroom? Use night lights. Make sure that you have a light by your bed that is easy to reach. Do not use any sheets or blankets that are too big for your bed. They should not hang down onto the floor. Have a firm chair that has side arms. You can use this for support while you get dressed. Do not have throw rugs and other things on the floor that can make you trip. What can I do in the kitchen? Clean up any spills right away. Avoid walking on wet floors. Keep items that you use a lot in easy-to-reach places. If you need to reach something above you, use a strong step stool that has a grab bar. Keep electrical cords out of the way. Do not use floor polish or wax that makes floors slippery. If you must use wax, use non-skid floor wax. Do not have throw rugs and other things on the floor that can make you trip. What can I do with my stairs?  Do not leave any items on the stairs. Make sure that there are handrails on both sides of the stairs and use them. Fix handrails that are broken or loose. Make sure that handrails are as long as the stairways. Check any carpeting to make sure that it is firmly attached to the stairs. Fix any carpet that is loose or worn. Avoid having throw rugs at the top or bottom of the stairs. If you do have throw rugs, attach them to the floor with carpet tape. Make sure that you have a light switch at the top of the stairs and the bottom of the stairs. If you do not have them,  ask someone to add them for you. What else can I do to help prevent falls? Wear shoes that: Do not have high heels. Have rubber bottoms. Are comfortable and fit you well. Are closed at the toe. Do not wear sandals. If you use a stepladder: Make sure that it is fully opened. Do not climb a closed stepladder. Make sure that both sides of the stepladder are locked into place. Ask someone to hold it for you, if possible. Clearly mark and make sure that you can see: Any grab bars or handrails. First and last steps. Where the edge of each step is. Use tools that help you move around (mobility aids) if they are needed. These include: Canes. Walkers. Scooters. Crutches. Turn on the lights when you go into a dark area. Replace any light bulbs as soon as they burn out. Set up your furniture so you have a clear path. Avoid moving your furniture around. If any of your floors are uneven, fix them. If there are any pets around you, be aware of where they are. Review your medicines with your doctor. Some medicines can make you feel dizzy. This can increase your chance of falling. Ask your doctor what other things that you can do to help prevent falls. This information is not intended to replace advice given to you by your health care provider. Make sure you discuss any questions you have with your health care provider. Document Released: 03/23/2009 Document Revised: 11/02/2015 Document Reviewed: 07/01/2014 Elsevier Interactive Patient Education  2017 [employer].

## 2021-12-14 ENCOUNTER — Other Ambulatory Visit: Payer: Self-pay

## 2021-12-14 ENCOUNTER — Inpatient Hospital Stay: Payer: Medicare Other

## 2021-12-14 ENCOUNTER — Encounter: Payer: Self-pay | Admitting: Hematology

## 2021-12-14 ENCOUNTER — Inpatient Hospital Stay (HOSPITAL_BASED_OUTPATIENT_CLINIC_OR_DEPARTMENT_OTHER): Payer: Medicare Other | Admitting: Hematology

## 2021-12-14 ENCOUNTER — Inpatient Hospital Stay: Payer: Medicare Other | Attending: Hematology

## 2021-12-14 VITALS — BP 142/60 | HR 72 | Temp 98.6°F | Resp 18 | Ht 62.0 in | Wt 168.2 lb

## 2021-12-14 DIAGNOSIS — Z79899 Other long term (current) drug therapy: Secondary | ICD-10-CM | POA: Diagnosis not present

## 2021-12-14 DIAGNOSIS — E538 Deficiency of other specified B group vitamins: Secondary | ICD-10-CM | POA: Insufficient documentation

## 2021-12-14 DIAGNOSIS — C49A2 Gastrointestinal stromal tumor of stomach: Secondary | ICD-10-CM

## 2021-12-14 DIAGNOSIS — Z1329 Encounter for screening for other suspected endocrine disorder: Secondary | ICD-10-CM

## 2021-12-14 DIAGNOSIS — D500 Iron deficiency anemia secondary to blood loss (chronic): Secondary | ICD-10-CM

## 2021-12-14 LAB — CBC WITH DIFFERENTIAL (CANCER CENTER ONLY)
Abs Immature Granulocytes: 0.01 10*3/uL (ref 0.00–0.07)
Basophils Absolute: 0 10*3/uL (ref 0.0–0.1)
Basophils Relative: 0 %
Eosinophils Absolute: 0.6 10*3/uL — ABNORMAL HIGH (ref 0.0–0.5)
Eosinophils Relative: 12 %
HCT: 26.9 % — ABNORMAL LOW (ref 36.0–46.0)
Hemoglobin: 9.1 g/dL — ABNORMAL LOW (ref 12.0–15.0)
Immature Granulocytes: 0 %
Lymphocytes Relative: 34 %
Lymphs Abs: 1.5 10*3/uL (ref 0.7–4.0)
MCH: 39.7 pg — ABNORMAL HIGH (ref 26.0–34.0)
MCHC: 33.8 g/dL (ref 30.0–36.0)
MCV: 117.5 fL — ABNORMAL HIGH (ref 80.0–100.0)
Monocytes Absolute: 0.5 10*3/uL (ref 0.1–1.0)
Monocytes Relative: 11 %
Neutro Abs: 1.9 10*3/uL (ref 1.7–7.7)
Neutrophils Relative %: 43 %
Platelet Count: 204 10*3/uL (ref 150–400)
RBC: 2.29 MIL/uL — ABNORMAL LOW (ref 3.87–5.11)
RDW: 16.3 % — ABNORMAL HIGH (ref 11.5–15.5)
WBC Count: 4.5 10*3/uL (ref 4.0–10.5)
nRBC: 0 % (ref 0.0–0.2)

## 2021-12-14 LAB — RETIC PANEL
Immature Retic Fract: 27.3 % — ABNORMAL HIGH (ref 2.3–15.9)
RBC.: 2.28 MIL/uL — ABNORMAL LOW (ref 3.87–5.11)
Retic Count, Absolute: 43.8 10*3/uL (ref 19.0–186.0)
Retic Ct Pct: 1.9 % (ref 0.4–3.1)
Reticulocyte Hemoglobin: 38.6 pg (ref >27.9)

## 2021-12-14 LAB — CMP (CANCER CENTER ONLY)
ALT: 11 U/L (ref 0–44)
AST: 15 U/L (ref 15–41)
Albumin: 3.9 g/dL (ref 3.5–5.0)
Alkaline Phosphatase: 53 U/L (ref 38–126)
Anion gap: 6 (ref 5–15)
BUN: 12 mg/dL (ref 8–23)
CO2: 31 mmol/L (ref 22–32)
Calcium: 9.4 mg/dL (ref 8.9–10.3)
Chloride: 103 mmol/L (ref 98–111)
Creatinine: 0.82 mg/dL (ref 0.44–1.00)
GFR, Estimated: >60 mL/min (ref >60)
Glucose, Bld: 117 mg/dL — ABNORMAL HIGH (ref 70–99)
Potassium: 4.2 mmol/L (ref 3.5–5.1)
Sodium: 140 mmol/L (ref 135–145)
Total Bilirubin: 0.4 mg/dL (ref 0.3–1.2)
Total Protein: 6.8 g/dL (ref 6.5–8.1)

## 2021-12-14 LAB — T4, FREE: Free T4: 0.79 ng/dL (ref 0.61–1.12)

## 2021-12-14 LAB — TSH: TSH: 5.722 u[IU]/mL — ABNORMAL HIGH (ref 0.350–4.500)

## 2021-12-14 LAB — FERRITIN: Ferritin: 234 ng/mL (ref 11–307)

## 2021-12-14 MED ORDER — LORATADINE 10 MG PO TABS: 10.0000 mg | ORAL_TABLET | Freq: Once | ORAL | Status: DC

## 2021-12-14 MED ORDER — DIPHENHYDRAMINE HCL 50 MG/ML IJ SOLN: 50.0000 mg | Freq: Once | INTRAMUSCULAR | Status: DC | PRN

## 2021-12-14 MED ORDER — SODIUM CHLORIDE 0.9% FLUSH: 3.0000 mL | Freq: Once | INTRAVENOUS | Status: DC | PRN

## 2021-12-14 MED ORDER — HEPARIN SOD (PORK) LOCK FLUSH 100 UNIT/ML IV SOLN: 500.0000 [IU] | Freq: Once | INTRAVENOUS | Status: DC | PRN

## 2021-12-14 MED ORDER — SODIUM CHLORIDE 0.9% FLUSH: 10.0000 mL | Freq: Once | INTRAVENOUS | Status: DC | PRN

## 2021-12-14 MED ORDER — ALBUTEROL SULFATE (2.5 MG/3ML) 0.083% IN NEBU: 2.5000 mg | INHALATION_SOLUTION | Freq: Once | RESPIRATORY_TRACT | Status: DC | PRN

## 2021-12-14 MED ORDER — SODIUM CHLORIDE 0.9 % IV SOLN: Freq: Once | INTRAVENOUS | Status: DC | PRN

## 2021-12-14 MED ORDER — HEPARIN SOD (PORK) LOCK FLUSH 100 UNIT/ML IV SOLN: 250.0000 [IU] | Freq: Once | INTRAVENOUS | Status: DC | PRN

## 2021-12-14 MED ORDER — METHYLPREDNISOLONE SODIUM SUCC 125 MG IJ SOLR: 125.0000 mg | Freq: Once | INTRAMUSCULAR | Status: DC | PRN

## 2021-12-14 MED ORDER — EPINEPHRINE 0.3 MG/0.3ML IJ SOAJ: 0.3000 mg | Freq: Once | INTRAMUSCULAR | Status: DC | PRN

## 2021-12-14 MED ORDER — SODIUM CHLORIDE 0.9 % IV SOLN: Freq: Once | INTRAVENOUS | Status: DC

## 2021-12-14 MED ORDER — SODIUM CHLORIDE 0.9 % IV SOLN: 510.0000 mg | Freq: Once | INTRAVENOUS | Status: DC

## 2021-12-14 MED ORDER — FAMOTIDINE IN NACL 20-0.9 MG/50ML-% IV SOLN: 20.0000 mg | Freq: Once | INTRAVENOUS | Status: DC | PRN

## 2021-12-14 MED ORDER — ACETAMINOPHEN 325 MG PO TABS: 650.0000 mg | ORAL_TABLET | Freq: Once | ORAL | Status: DC

## 2021-12-14 MED ORDER — CYANOCOBALAMIN 1000 MCG/ML IJ SOLN
1000.0000 ug | Freq: Once | INTRAMUSCULAR | Status: AC
  Administered 2021-12-14: 1000 ug via INTRAMUSCULAR
  Filled 2021-12-14: qty 1

## 2021-12-14 MED ORDER — ALTEPLASE 2 MG IJ SOLR: 2.0000 mg | Freq: Once | INTRAMUSCULAR | Status: DC | PRN

## 2021-12-14 NOTE — Progress Notes (Signed)
[place]   [phone] Fax:([phone]) [phone]   Clinic Follow up Note  Patient Care Team: [name], MD as PCP - Charissa Bash, MD as Consulting Physician (Oncology) Jonnie Finner, RN (Inactive) as Oncology Nurse Navigator Stark Klein, MD as Consulting Physician (General Surgery) Milus Banister, MD as Attending Physician (Gastroenterology)  Date of Service:  12/14/2021  CHIEF COMPLAINT: f/u of GIST  CURRENT THERAPY:  -Sunitinib, started 09/03/21, currently 37.5 mg daily -B12 injection, monthly, 04/14/20-12/05/20. Restarted q6weeks on 03/23/21.  ASSESSMENT & PLAN:  Bridget Grant is a 76 y.o. female with   1. Gastric GIST, ypT3NxM0,  High grade, Kit mutation (+),local recurrence in 07/2021 -She was diagnosed in 01/2020 with High grade gastrointestinal stromal tumor.  -Her staging CT CAP from 01/13/20 did not show gastric mass (I think the greater curvature of gastric wall is thickened) but did show incidental findings of left breast mass and 6.49m sclerotic lesion at T7.  -Given her bleeding tumor, large size and other work up, I started her on Gleevec 4067mon 01/27/20, prior to surgery.  -s/p resection on 02/29/20 with Dr ByBarry DienesPath showed: high-grade GIST spanning 8 cm infiltrating through most of the distal gastric wall, with a positive distal resection margin, staged pT3; areas of rhabdomyosarcomatous differentiation but is not felt to represent a mixed rhabdomyosarcoma/GIST tumor. Unfortunately, given these features she is at very high risk for recurrence and metastasis.  -most recent CT AP from 07/02/21 showed no evidence of recurrence.  She has a stable 15 mm liver lesion which has been stable, likely benign. -liver MRI on 07/26/21 showed: two enhancing gastric wall masses, concerning for tumor recurrence; a third enhancing soft tissue nodularity at GEDenairunction/within distal-most esophagus, indeterminate. -repeat EUS on 08/09/21 with Dr. JaArdis Hughsshowed the two stomach masses. Pathology confirmed spindle cell proliferation consistent with GIST. -she met with Dr. ByBarry Dienesn 08/24/21 to discuss surgery. They discussed that she would require a total gastrectomy, which is a big surgery. She is reluctant to consider surgery and declined  -she switched to sunitinib on 09/03/21; we will monitor her thyroid function on treatment. She started at 50 mg daily 4 weeks on, 2 weeks off. She developed some stomach discomfort and fatigue, and we decreased her dose to 37.20m89maily. After 5-6 weeks treatment, she developed a severe diarrhea, and continues to change, and stopped on her own. -We discussed dose adjustment, she does not want to try the high dose again, she is willing to take Sutent 37.5 mg daily, 3 weeks on and 1 week off.  She was started in about 2 weeks when she feels better -Follow-up in 6 weeks with abdominal MRI a few days before.   2. Anemia secondary to GI bleeding from #1, B12 deficiency  -She was hospitalized on 01/12/20 due to severe anemia with Hg 7.9.  -She was treated with blood transfusion on 01/17/20 and 03/06/20 and IV Feraheme on 01/15/20 and 02/09/20 -she was previously on monthly B12 injections, lost to f/u 12/2020-02/2021, restarted B12 injections 03/23/21 q6weeks. She will receive a dose today.     PLAN:  -B12 injection today -restart sunitinib in 2 weeks, at 37.20mg19mily, 3 weeks on and 1 week off -f/u, and B12 injection in 6 weeks with lab and abdominal MRI w wo contrast for restaging.     No problem-specific Assessment & Plan notes found for this encounter.   SUMMARY OF ONCOLOGIC HISTORY: Oncology History Overview Note  Cancer  Staging Malignant gastrointestinal stromal tumor (GIST) of stomach (HCC) Staging form: Gastrointestinal Stromal Tumor - Gastric and Omental GIST, AJCC 8th Edition - Pathologic stage from 02/29/2020: pT3, Mitotic Rate: High - Unsigned Histologic grade (G): High grade Histologic grading system: 2 grade  system Location of the gastrointestinal stromal tumor: Stomach    Malignant gastrointestinal stromal tumor (GIST) of stomach ([place])  01/13/2020 Procedure   EGD/Upper Endoscopy by Dr Ardis Hughs 01/13/20  IMPRESSION - There was an ulcerated mass along the greater curvature of the stomach, proximal edge was about 5-6cm from the GE junction and the mass was 7-8cm long, 2-3cm wide. It was not circumferental. The mass was friable with spontaneous oozing, contact oozing, ulcerated and heaped up. Some parts of the mass were quite soft whereas other areas were more firm. This was very suspicious for a malignancy and it was biopsied extensively. There was mild distal gastritis, biopsied to check for H. pylori. - The examination was otherwise normal.   01/13/2020 Initial Biopsy   FINAL MICROSCOPIC DIAGNOSIS:  A. STOMACH, BIOPSY:  - High grade gastrointestinal stromal tumor, see comment.   B. STOMACH, DISTAL, BIOPSY:  - Mild reactive gastropathy with erosions.  - Warthin-Starry is negative for Helicobacter pylori.  - No intestinal metaplasia, dysplasia, or malignancy.  COMMENT:  A. The tumor has spindle and epithelioid components and appears high  grade (7 mitosis/20 hpf). Immunohistochemistry is positive for CD117 and  CD34. The cells are negative for cytokeratin 20, cytokeratin 7,  cytokeratin 5/6, CDX-2, SMA, and desmin. The findings are consistent  with a gastrointestinal stromal tumor. Dr. Vic Ripper has reviewed the  case. Dr. Ardis Hughs' nurse was notified on 01/18/2020.     01/13/2020 Imaging   CT CAP W contrast 01/13/20  IMPRESSION: 1. 2.1 cm x 1.1 cm well-defined area of low attenuation within the soft tissues of the left breast. This may represent a lymph node, however, correlation with mammography is recommended. 2. 6.5 mm sclerotic focus within the posterior aspect of the T7 vertebral body. This may represent a bone island, however, correlation with follow-up abdomen pelvis CT is recommended  to exclude the presence of a metastatic lesion. 3. Small hiatal hernia. 4. 2.6 cm x 2.0 cm fat containing umbilical hernia. 5. Aortic atherosclerosis.    01/23/2020 Initial Diagnosis   Malignant gastrointestinal stromal tumor (GIST) of stomach ([place])   01/26/2020 -  Chemotherapy   Gleevec 458m once daily starting 01/26/20    02/11/2020 PET scan   IMPRESSION: 1. Focal mass along the stomach fundus with maximum SUV 15.7, compatible with malignancy. No findings of metastatic spread. 2. Mildly increased sub solid nodularity in the left upper lobe with only very subtle associated activity, likely inflammatory. Incidental note is made of some secondary pulmonary lobular septal thickening in the lung apices, and mild interstitial edema is not excluded given the underlying cardiomegaly. 3. Other imaging findings of potential clinical significance: Aortic Atherosclerosis (ICD10-I70.0). Coronary atherosclerosis. Sigmoid colon diverticulosis. Small umbilical hernia contains adipose tissue. Physiologic activity along the costovertebral joints.   02/29/2020 Surgery   Partial gastrectomy by Dr. BBarry Dienes   02/29/2020 Pathology Results   FINAL MICROSCOPIC DIAGNOSIS:  A. STOMACH, PARTIAL GASTRECTOMY:  - High grade gastrointestinal stromal tumor with dedifferentiation,  spanning approximately 8 cm  - Tumor infiltrates through most of the distal gastric wall and involves  the distal resection margin  - See oncology table  - See comment  Immunohistochemical stains show that the tumor cells are positive for  CD117, CD34 and  SMA (patchy).  A component of the tumor shows very  high-grade features with loss of CD117 and CD34 expression, consistent  with dedifferentiation.  Tumor also shows areas with  rhabdomyosarcomatous differentiation with presence of desmin expression.    04/13/2020 Procedure   EUS  by Dr Ardis Hughs  IMPRESSION - The staple line from recent partial gastrectomy was noted in the  proximal stomach, runs longitudinally along the gastric fundus. The mucosa immediately around the stable line was normal appearing however in the more distal gastric body (anterior wall likely) the mucosa was quite irregular appearing; friable, firm, slightly ulcerated for 4-5cm at least. This was not a discrete tumor, and these changes were about 5cm from the pylorus. I sampled this region extensively with forceps following EUS evaluation below.- The muscularis propria was abnormally thickened starting around the staple line and continuing throughout most of the stomach distally. This thickening was not circumferential.     FINAL MICROSCOPIC DIAGNOSIS:   A. GASTRIC BODY, BIOPSY:  - Gastric mucosa with changes consistent with reactive gastropathy.  - No spindle cell lesion, intestinal metaplasia, dysplasia or carcinoma.     10/12/2020 Procedure   EUS by Dr. Ardis Hughs  Impression: - The overall endoscopic appearance of the stomach, s/p wedge resection was [facility] IMPROVED from 04/2020 EGD/EUS. The wedge 'anastomosis' was normal appearing. The previously ulcerated mucosa was normal now. There was very mild, non-specific pangastritis. Given the significantly improved appearance, no biopsies were taken. EUS evaluation reveals expected anatomically abnormal findings at the wedge resection site (related to post operative changes) but no significanly thickened gastric layers or masses.   07/02/2021 Imaging   EXAM: CT CHEST, ABDOMEN, AND PELVIS WITH CONTRAST  IMPRESSION: 1. Stable exam. No new or progressive findings to suggest recurrent or metastatic disease. 2. Stable 15 mm hypoattenuating lesion in the lateral segment left liver, likely benign. Continued attention on follow-up recommended. 3. Small hiatal hernia. 4. Left colonic diverticulosis without diverticulitis. 5. Aortic Atherosclerosis (ICD10-I70.0).   07/26/2021 Imaging   EXAM: MRI ABDOMEN WITHOUT AND WITH CONTRAST  IMPRESSION: 1.  Two enhancing gastric wall masses identified as described, concerning for tumor recurrence. A third enhancing soft tissue nodularity is visualized at the gastroesophageal junction/within the distal most esophagus which could represent tumor or postsurgical change. Consider upper endoscopy. 2. Lesion in the left hepatic lobe seen on prior CT scans is not well visualized, not definitely enhancing. 3. Other ancillary findings as described.   08/09/2021 Pathology Results   FINAL MICROSCOPIC DIAGNOSIS:   A. STOMACH, DISTAL MASS, BIOPSY:  - Spindle cell proliferation consistent with gastrointestinal stromal  tumor (GIST).  - See comment.   COMMENT:  There are fragments of gastric mucosa and fragments composed of spindle cell proliferation with intervening collagenous stroma.  Immunohistochemistry shows strong positivity with CD117 and patchy positivity with CD34.  Cytokeratin AE1/AE3 is negative in the spindle cell proliferation.  Ki-67 shows a high proliferation index.  The findings are consistent with gastrointestinal stromal tumor (GIST).   Specimen Submitted:  A. GASTRIC, PROXIMAL SUBMUCOSAL MASS, FINE NEEDLE ASPIRATION:  FINAL MICROSCOPIC DIAGNOSIS:  - Spindle cell proliferation    08/09/2021 Procedure   Upper EUS, Dr. Ardis Hughs  Impression: - There were 2 masses in the stomach. The proximal mass was located about the 3 cm distal to the GE junction, measured 2.8 cm maximally, it was completely submucosal. I sampled this mass with EUS, FNB. The distal mass 3 cm maximally, it was located at the distal aspect of the previous wedge  resection "anastomosis." I sampled this mass with mucosal biopsies as there was clearly a mucosal component.      INTERVAL HISTORY:  Bridget Grant is here for a follow up of GIST. She was last seen by me on 10/02/21. She presents to the clinic alone.  She started Sutent 37.5 mg daily in early May, she initially tolerated well, but developed severe diarrhea and  taste loss about a week ago, so she stopped it.  Her diarrhea resolved the next day, but her taste has not come back yet.  She has lost about 7 pounds in the past few months.  Fatigue is also improved after she came off the medicine.  She is able to function well at home.  No other new complaints.   All other systems were reviewed with the patient and are negative.  MEDICAL HISTORY:  Past Medical History:  Diagnosis Date   Anxiety    Cancer ([place]) 2021   stomach   Complication of anesthesia    Depression    Hyperlipidemia    Hypertension    Migraine    Osteopenia    PONV (postoperative nausea and vomiting)    Type II or unspecified type diabetes mellitus without mention of complication, uncontrolled 04/09/2013   Upper GI bleed 01/13/2020    SURGICAL HISTORY: Past Surgical History:  Procedure Laterality Date   ABDOMINAL HYSTERECTOMY     APPENDECTOMY     BIOPSY  01/13/2020   Procedure: BIOPSY;  Surgeon: Milus Banister, MD;  Location: [facility] ENDOSCOPY;  Service: Endoscopy;;   BIOPSY  04/13/2020   Procedure: BIOPSY;  Surgeon: Milus Banister, MD;  Location: [facility] ENDOSCOPY;  Service: Endoscopy;;   BIOPSY  08/09/2021   Procedure: BIOPSY;  Surgeon: Milus Banister, MD;  Location: [facility] ENDOSCOPY;  Service: Gastroenterology;;   CHOLECYSTECTOMY     ESOPHAGOGASTRODUODENOSCOPY N/A 08/09/2021   Procedure: ESOPHAGOGASTRODUODENOSCOPY (EGD);  Surgeon: Milus Banister, MD;  Location: [facility] ENDOSCOPY;  Service: Gastroenterology;  Laterality: N/A;   ESOPHAGOGASTRODUODENOSCOPY (EGD) WITH PROPOFOL N/A 01/13/2020   Procedure: ESOPHAGOGASTRODUODENOSCOPY (EGD) WITH PROPOFOL;  Surgeon: Milus Banister, MD;  Location: [facility] ENDOSCOPY;  Service: Endoscopy;  Laterality: N/A;   ESOPHAGOGASTRODUODENOSCOPY (EGD) WITH PROPOFOL N/A 04/13/2020   Procedure: ESOPHAGOGASTRODUODENOSCOPY (EGD) WITH PROPOFOL;  Surgeon: Milus Banister, MD;  Location: [facility] ENDOSCOPY;  Service: Endoscopy;  Laterality: N/A;   ESOPHAGOGASTRODUODENOSCOPY  (EGD) WITH PROPOFOL N/A 10/12/2020   Procedure: ESOPHAGOGASTRODUODENOSCOPY (EGD) WITH PROPOFOL;  Surgeon: Milus Banister, MD;  Location: [facility] ENDOSCOPY;  Service: Endoscopy;  Laterality: N/A;   EUS N/A 04/13/2020   Procedure: UPPER ENDOSCOPIC ULTRASOUND (EUS) LINEAR;  Surgeon: Milus Banister, MD;  Location: [facility] ENDOSCOPY;  Service: Endoscopy;  Laterality: N/A;   EUS N/A 10/12/2020   Procedure: UPPER ENDOSCOPIC ULTRASOUND (EUS) RADIAL;  Surgeon: Milus Banister, MD;  Location: [facility] ENDOSCOPY;  Service: Endoscopy;  Laterality: N/A;   EUS N/A 08/09/2021   Procedure: UPPER ENDOSCOPIC ULTRASOUND (EUS) RADIAL;  Surgeon: Milus Banister, MD;  Location: [facility] ENDOSCOPY;  Service: Gastroenterology;  Laterality: N/A;   FINE NEEDLE ASPIRATION N/A 08/09/2021   Procedure: FINE NEEDLE ASPIRATION (FNA) LINEAR;  Surgeon: Milus Banister, MD;  Location: [facility] ENDOSCOPY;  Service: Gastroenterology;  Laterality: N/A;   PARTIAL GASTRECTOMY  02/28/2020    I have reviewed the social history and family history with the patient and they are unchanged from previous note.  ALLERGIES:  has No Known Allergies.  MEDICATIONS:  Current Outpatient Medications  Medication Sig Dispense Refill  acetaminophen (TYLENOL) 500 MG tablet Take 1,000 mg by mouth every 6 (six) hours as needed for moderate pain.     atorvastatin (LIPITOR) 20 MG tablet TAKE 1 TABLET BY MOUTH EVERY DAY 90 tablet 3   diltiazem (CARDIZEM CD) 240 MG 24 hr capsule TAKE 1 CAPSULE(240 MG) BY MOUTH DAILY 90 capsule 3   Homeopathic Products (THERAWORX RELIEF EX) Apply 1 application topically daily as needed (cramping).     hydrochlorothiazide (HYDRODIURIL) 25 MG tablet Take 1 tablet (25 mg total) by mouth daily. 90 tablet 3   lisinopril (ZESTRIL) 20 MG tablet TAKE 1 TABLET BY MOUTH EVERY DAY 90 tablet 0   metFORMIN (GLUCOPHAGE-XR) 500 MG 24 hr tablet Take 2 tablets (1,000 mg total) by mouth daily with breakfast. 180 tablet 3   ondansetron (ZOFRAN ODT) 8 MG disintegrating  tablet Take 1 tablet (8 mg total) by mouth every 8 (eight) hours as needed for nausea or vomiting. 30 tablet 1   OVER THE COUNTER MEDICATION Take 1 capsule by mouth daily. Super Beets otc supplement     pantoprazole (PROTONIX) 40 MG tablet Take 1 tablet (40 mg total) by mouth 2 (two) times daily before a meal. TAKE 1 TABLET(40 MG) BY MOUTH TWICE DAILY BEFORE A MEAL 180 tablet 0   Prenatal Vit-Fe Fumarate-FA (PRENATAL PO) Take 3 tablets by mouth daily.     SUNItinib (SUTENT) 37.5 MG capsule TAKE 1 CAPSULE BY MOUTH 1 TIME A DAY 30 capsule 1   triamcinolone cream (KENALOG) 0.1 % Apply 1 application. topically 2 (two) times daily. 30 g 1   trolamine salicylate (ASPERCREME) 10 % cream Apply 1 application topically as needed for muscle pain.     No current facility-administered medications for this visit.    PHYSICAL EXAMINATION: ECOG PERFORMANCE STATUS: 1 - Symptomatic but completely ambulatory  Vitals:   12/14/21 1458  BP: (!) 142/60  Pulse: 72  Resp: 18  Temp: 98.6 F (37 C)  SpO2: 100%   Wt Readings from Last 3 Encounters:  12/14/21 168 lb 3.2 oz (76.3 kg)  10/08/21 181 lb 3.2 oz (82.2 kg)  10/02/21 181 lb 11.2 oz (82.4 kg)     GENERAL:alert, no distress and comfortable SKIN: skin color, texture, turgor are normal, no rashes or significant lesions EYES: normal, Conjunctiva are pink and non-injected, sclera clear NECK: supple, thyroid normal size, non-tender, without nodularity LYMPH:  no palpable lymphadenopathy in the cervical, axillary  LUNGS: clear to auscultation and percussion with normal breathing effort HEART: regular rate & rhythm and no murmurs and no lower extremity edema ABDOMEN:abdomen soft, non-tender and normal bowel sounds Musculoskeletal:no cyanosis of digits and no clubbing  NEURO: alert & oriented x 3 with fluent speech, no focal motor/sensory deficits  LABORATORY DATA:  I have reviewed the data as listed    Latest Ref Rng & Units 12/14/2021    2:13 PM  10/25/2021    1:53 PM 10/02/2021   12:32 PM  CBC  WBC 4.0 - 10.5 K/uL 4.5  4.8  4.6   Hemoglobin 12.0 - 15.0 g/dL 9.1  11.5  11.8   Hematocrit 36.0 - 46.0 % 26.9  32.3  34.2   Platelets 150 - 400 K/uL 204  172  145         Latest Ref Rng & Units 12/14/2021    2:13 PM 10/25/2021    1:53 PM 10/02/2021   12:32 PM  CMP  Glucose 70 - 99 mg/dL 117  142  117  BUN 8 - 23 mg/dL 12  16  11    Creatinine 0.44 - 1.00 mg/dL 0.82  0.92  0.75   Sodium 135 - 145 mmol/L 140  139  141   Potassium 3.5 - 5.1 mmol/L 4.2  3.9  3.3   Chloride 98 - 111 mmol/L 103  103  101   CO2 22 - 32 mmol/L 31  29  33   Calcium 8.9 - 10.3 mg/dL 9.4  9.4  9.0   Total Protein 6.5 - 8.1 g/dL 6.8  7.6  6.9   Total Bilirubin 0.3 - 1.2 mg/dL 0.4  0.5  0.7   Alkaline Phos 38 - 126 U/L 53  66  69   AST 15 - 41 U/L 15  22  40   ALT 0 - 44 U/L 11  19  39       RADIOGRAPHIC STUDIES: I have personally reviewed the radiological images as listed and agreed with the findings in the report. No results found.    Orders Placed This Encounter  Procedures   MR Abdomen W Wo Contrast    Standing Status:   Future    Standing Expiration Date:   12/15/2022    Order Specific Question:   If indicated for the ordered procedure, I authorize the administration of contrast media per Radiology protocol    Answer:   Yes    Order Specific Question:   What is the patient's sedation requirement?    Answer:   No Sedation    Order Specific Question:   Does the patient have a pacemaker or implanted devices?    Answer:   No    Order Specific Question:   Preferred imaging location?    Answer:   [facility] (table limit - 550 lbs)   All questions were answered. The patient knows to call the clinic with any problems, questions or concerns. No barriers to learning was detected. The total time spent in the appointment was 30 minutes.     Truitt Merle, MD 12/14/2021   I, Wilburn Mylar, am acting as scribe for Truitt Merle, MD.   I have  reviewed the above documentation for accuracy and completeness, and I agree with the above.

## 2021-12-17 ENCOUNTER — Telehealth: Payer: Self-pay

## 2021-12-17 NOTE — Telephone Encounter (Signed)
-----   Message from Truitt Merle, MD sent at 12/17/2021  7:42 AM EDT ----- Please let pt know her thyroid function is slightly off, but does not require treatment. Please forward the lab results to her PCP, thanks  Truitt Merle  12/17/2021

## 2021-12-17 NOTE — Telephone Encounter (Signed)
This nurse reached out to patient and made aware of lab results and MD recommendations.  Patient also made aware that lab results have been sent over to her primary care physician for review.  Patient acknowledged understanding and has no questions or concerns at this time.

## 2021-12-18 ENCOUNTER — Other Ambulatory Visit: Payer: Self-pay

## 2022-01-16 ENCOUNTER — Telehealth: Payer: Self-pay | Admitting: Hematology

## 2022-01-16 NOTE — Telephone Encounter (Signed)
Rescheduled upcoming appointment due to provider's PAL. Patient is aware of changes. ?

## 2022-01-17 ENCOUNTER — Ambulatory Visit: Payer: Self-pay | Admitting: Licensed Clinical Social Worker

## 2022-01-17 NOTE — Patient Outreach (Signed)
  Care Coordination  Initial Visit Note   01/17/2022 Name: Bridget Grant MRN: [id number] DOB: 1946/03/20  Bridget Grant is a 76 y.o. year old female who sees [name], MD for primary care. I spoke with  Bridget Grant by phone today  What matters to the patients health and wellness today?  Patient reports no concerns or needs with health and wellness related to physical or mental heath. .    Goals Addressed             This Visit's Progress    COMPLETED: Care Coordination Activities/ No Follow up Required       Care Coordination Interventions: Medicare AWV completed 12/05/2021 Reconciliation sent to QC to update           SDOH assessments and interventions completed:  No completed during AWV no needs or changes per patient.   Care Coordination Interventions Activated:  Yes  Care Coordination Interventions:  Yes, provided   Follow up plan: No further intervention required. Referral made to QC to update completion of AWV    Encounter Outcome:  Pt. Visit Completed   Casimer Lanius, [place] [phone]

## 2022-01-17 NOTE — Patient Instructions (Signed)
Visit Information  Thank you for taking time to visit with me today. Please don't hesitate to contact me if I can be of assistance to you.   No goals were established today:  Care Coordination support offered, No needs identified   Goals Addressed             This Visit's Progress    COMPLETED: Care Coordination Activities/ No Follow up Required       Care Coordination Interventions: Medicare AWV completed 12/05/2021 Reconciliation sent to QC to update           Please call the care guide team at [phone] if you need to cancel or reschedule your appointment.   The patient verbalized understanding of instructions, educational materials, and care plan provided today and DECLINED offer to receive copy of patient instructions, educational materials, and care plan.   No further follow up required: by care coordination, no needs identified   Casimer Lanius, [place] [phone]

## 2022-01-20 ENCOUNTER — Ambulatory Visit (HOSPITAL_COMMUNITY)
Admission: RE | Admit: 2022-01-20 | Discharge: 2022-01-20 | Disposition: A | Discharge status: Home / Self Care | Payer: Medicare Other | Source: Ambulatory Visit | Attending: Hematology | Admitting: Hematology

## 2022-01-20 DIAGNOSIS — C787 Secondary malignant neoplasm of liver and intrahepatic bile duct: Secondary | ICD-10-CM | POA: Diagnosis not present

## 2022-01-20 DIAGNOSIS — C49A2 Gastrointestinal stromal tumor of stomach: Secondary | ICD-10-CM | POA: Diagnosis not present

## 2022-01-20 DIAGNOSIS — K7689 Other specified diseases of liver: Secondary | ICD-10-CM | POA: Diagnosis not present

## 2022-01-20 DIAGNOSIS — N281 Cyst of kidney, acquired: Secondary | ICD-10-CM | POA: Diagnosis not present

## 2022-01-20 MED ORDER — GADOBUTROL 1 MMOL/ML IV SOLN
8.0000 mL | Freq: Once | INTRAVENOUS | Status: AC | PRN
  Administered 2022-01-20: 8 mL via INTRAVENOUS

## 2022-01-22 ENCOUNTER — Telehealth: Payer: Self-pay | Admitting: Hematology

## 2022-01-22 ENCOUNTER — Telehealth: Payer: Self-pay

## 2022-01-22 NOTE — Telephone Encounter (Signed)
Scheduled per 8/15 in basket, pt called and confirmed

## 2022-01-22 NOTE — Telephone Encounter (Signed)
Spoke with pt via telephone regarding appointment change.  Informed pt that her appt will be moved to Friday 01/25/2022 with Cassandra H. PA-C and Dr. Burr Medico to go over her MRI results.  Informed pt that someone from our Scheduling Team will be contacting her to schedule that appt.  Pt verbalized understanding and had no further questions at this time.

## 2022-01-23 NOTE — Progress Notes (Signed)
[facility] OFFICE PROGRESS NOTE  [address], [place] [address]  DIAGNOSIS:  f/u of GIST  Oncology History Overview Note  Cancer Staging Malignant gastrointestinal stromal tumor (GIST) of stomach ([place]) Staging form: Gastrointestinal Stromal Tumor - Gastric and Omental GIST, AJCC 8th Edition - Pathologic stage from 02/29/2020: pT3, Mitotic Rate: High - Unsigned Histologic grade (G): High grade Histologic grading system: 2 grade system Location of the gastrointestinal stromal tumor: Stomach    Malignant gastrointestinal stromal tumor (GIST) of stomach ([place])  01/13/2020 Procedure   EGD/Upper Endoscopy by Dr Ardis Hughs 01/13/20  IMPRESSION - There was an ulcerated mass along the greater curvature of the stomach, proximal edge was about 5-6cm from the GE junction and the mass was 7-8cm long, 2-3cm wide. It was not circumferental. The mass was friable with spontaneous oozing, contact oozing, ulcerated and heaped up. Some parts of the mass were quite soft whereas other areas were more firm. This was very suspicious for a malignancy and it was biopsied extensively. There was mild distal gastritis, biopsied to check for H. pylori. - The examination was otherwise normal.   01/13/2020 Initial Biopsy   FINAL MICROSCOPIC DIAGNOSIS:  A. STOMACH, BIOPSY:  - High grade gastrointestinal stromal tumor, see comment.   B. STOMACH, DISTAL, BIOPSY:  - Mild reactive gastropathy with erosions.  - Warthin-Starry is negative for Helicobacter pylori.  - No intestinal metaplasia, dysplasia, or malignancy.  COMMENT:  A. The tumor has spindle and epithelioid components and appears high  grade (7 mitosis/20 hpf). Immunohistochemistry is positive for CD117 and  CD34. The cells are negative for cytokeratin 20, cytokeratin 7,  cytokeratin 5/6, CDX-2, SMA, and desmin. The findings are consistent  with a gastrointestinal stromal tumor. Dr. Vic Ripper has reviewed the  case. Dr.  Ardis Hughs' nurse was notified on 01/18/2020.     01/13/2020 Imaging   CT CAP W contrast 01/13/20  IMPRESSION: 1. 2.1 cm x 1.1 cm well-defined area of low attenuation within the soft tissues of the left breast. This may represent a lymph node, however, correlation with mammography is recommended. 2. 6.5 mm sclerotic focus within the posterior aspect of the T7 vertebral body. This may represent a bone island, however, correlation with follow-up abdomen pelvis CT is recommended to exclude the presence of a metastatic lesion. 3. Small hiatal hernia. 4. 2.6 cm x 2.0 cm fat containing umbilical hernia. 5. Aortic atherosclerosis.    01/23/2020 Initial Diagnosis   Malignant gastrointestinal stromal tumor (GIST) of stomach ([place])   01/26/2020 -  Chemotherapy   Gleevec 47m once daily starting 01/26/20    02/11/2020 PET scan   IMPRESSION: 1. Focal mass along the stomach fundus with maximum SUV 15.7, compatible with malignancy. No findings of metastatic spread. 2. Mildly increased sub solid nodularity in the left upper lobe with only very subtle associated activity, likely inflammatory. Incidental note is made of some secondary pulmonary lobular septal thickening in the lung apices, and mild interstitial edema is not excluded given the underlying cardiomegaly. 3. Other imaging findings of potential clinical significance: Aortic Atherosclerosis (ICD10-I70.0). Coronary atherosclerosis. Sigmoid colon diverticulosis. Small umbilical hernia contains adipose tissue. Physiologic activity along the costovertebral joints.   02/29/2020 Surgery   Partial gastrectomy by Dr. BBarry Dienes   02/29/2020 Pathology Results   FINAL MICROSCOPIC DIAGNOSIS:  A. STOMACH, PARTIAL GASTRECTOMY:  - High grade gastrointestinal stromal tumor with dedifferentiation,  spanning approximately 8 cm  - Tumor infiltrates through most of the distal gastric wall  and involves  the distal resection margin  - See oncology table  - See  comment  Immunohistochemical stains show that the tumor cells are positive for  CD117, CD34 and SMA (patchy).  A component of the tumor shows very  high-grade features with loss of CD117 and CD34 expression, consistent  with dedifferentiation.  Tumor also shows areas with  rhabdomyosarcomatous differentiation with presence of desmin expression.    04/13/2020 Procedure   EUS  by Dr Ardis Hughs  IMPRESSION - The staple line from recent partial gastrectomy was noted in the proximal stomach, runs longitudinally along the gastric fundus. The mucosa immediately around the stable line was normal appearing however in the more distal gastric body (anterior wall likely) the mucosa was quite irregular appearing; friable, firm, slightly ulcerated for 4-5cm at least. This was not a discrete tumor, and these changes were about 5cm from the pylorus. I sampled this region extensively with forceps following EUS evaluation below.- The muscularis propria was abnormally thickened starting around the staple line and continuing throughout most of the stomach distally. This thickening was not circumferential.     FINAL MICROSCOPIC DIAGNOSIS:   A. GASTRIC BODY, BIOPSY:  - Gastric mucosa with changes consistent with reactive gastropathy.  - No spindle cell lesion, intestinal metaplasia, dysplasia or carcinoma.     10/12/2020 Procedure   EUS by Dr. Ardis Hughs  Impression: - The overall endoscopic appearance of the stomach, s/p wedge resection was [facility] IMPROVED from 04/2020 EGD/EUS. The wedge 'anastomosis' was normal appearing. The previously ulcerated mucosa was normal now. There was very mild, non-specific pangastritis. Given the significantly improved appearance, no biopsies were taken. EUS evaluation reveals expected anatomically abnormal findings at the wedge resection site (related to post operative changes) but no significanly thickened gastric layers or masses.   07/02/2021 Imaging   EXAM: CT CHEST, ABDOMEN, AND  PELVIS WITH CONTRAST  IMPRESSION: 1. Stable exam. No new or progressive findings to suggest recurrent or metastatic disease. 2. Stable 15 mm hypoattenuating lesion in the lateral segment left liver, likely benign. Continued attention on follow-up recommended. 3. Small hiatal hernia. 4. Left colonic diverticulosis without diverticulitis. 5. Aortic Atherosclerosis (ICD10-I70.0).   07/26/2021 Imaging   EXAM: MRI ABDOMEN WITHOUT AND WITH CONTRAST  IMPRESSION: 1. Two enhancing gastric wall masses identified as described, concerning for tumor recurrence. A third enhancing soft tissue nodularity is visualized at the gastroesophageal junction/within the distal most esophagus which could represent tumor or postsurgical change. Consider upper endoscopy. 2. Lesion in the left hepatic lobe seen on prior CT scans is not well visualized, not definitely enhancing. 3. Other ancillary findings as described.   08/09/2021 Pathology Results   FINAL MICROSCOPIC DIAGNOSIS:   A. STOMACH, DISTAL MASS, BIOPSY:  - Spindle cell proliferation consistent with gastrointestinal stromal  tumor (GIST).  - See comment.   COMMENT:  There are fragments of gastric mucosa and fragments composed of spindle cell proliferation with intervening collagenous stroma.  Immunohistochemistry shows strong positivity with CD117 and patchy positivity with CD34.  Cytokeratin AE1/AE3 is negative in the spindle cell proliferation.  Ki-67 shows a high proliferation index.  The findings are consistent with gastrointestinal stromal tumor (GIST).   Specimen Submitted:  A. GASTRIC, PROXIMAL SUBMUCOSAL MASS, FINE NEEDLE ASPIRATION:  FINAL MICROSCOPIC DIAGNOSIS:  - Spindle cell proliferation    08/09/2021 Procedure   Upper EUS, Dr. Ardis Hughs  Impression: - There were 2 masses in the stomach. The proximal mass was located about the 3 cm distal to the GE junction, measured  2.8 cm maximally, it was completely submucosal. I sampled this mass  with EUS, FNB. The distal mass 3 cm maximally, it was located at the distal aspect of the previous wedge resection "anastomosis." I sampled this mass with mucosal biopsies as there was clearly a mucosal component.      CURRENT THERAPY: -Sunitinib, started 09/03/21, currently 37.5 mg daily -B12 injection, monthly, 04/14/20-12/05/20. Restarted q6weeks on 03/23/21. She self held her treatment from Late June 2023-Present  INTERVAL HISTORY: Bridget Grant 76 y.o. female returns to the clinic today for a follow-up visit.  The patient was last seen by Dr. Burr Medico on 12/14/2021.  The patient was undergoing treatment with sunitinib.  She had been having some difficulty with tolerance to this.  Her dose had been reduced to 37.5 mg and she continued to have some difficulty.  She then self discontinued her medication for period of time approximately 1 week before her appointment with Dr. Burr Medico on 12/14/21. When she last saw Dr. Burr Medico on 12/14/2021 the patient agreed to restart taking this medication 3 weeks on 1 week off.  She planned to started mid July after giving herself time to feel better.  Today, the patient states she never restarted treatment because she was "feeling too good".    Since last being seen, the patient is feeling well and has no complaints today.  She is wanting to know what her prognosis is.  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  She is worried about who is going to take care of him if something happens to her.  She denies any fever, chills, or night sweats.  She has been progressively losing weight over the last couple months.  She states this is due to the side effects when she was on Sunitinib, which caused some taste alterations.  She states her taste is still not quite back to baseline.  She reports meats and breads still do not taste correctly.  She does have an appetite.  Denies any thrush. She denies any nausea, vomiting, diarrhea, or constipation.  She denies any unusual  abdominal pain.  Denies any abnormal bleeding or bruising including hemoptysis, hematemesis, melena, or hematochezia.  Denies any rashes or skin changes.  Denies any chest pain, shortness of breath, cough, or hemoptysis.  She recently had a repeat MRI performed.  She is here today for evaluation to review her scan and for more detailed discussion about her current condition and recommended treatment options.     MEDICAL HISTORY: Past Medical History:  Diagnosis Date   Anxiety    Cancer ([place]) 2021   stomach   Complication of anesthesia    Depression    Hyperlipidemia    Hypertension    Migraine    Osteopenia    PONV (postoperative nausea and vomiting)    Type II or unspecified type diabetes mellitus without mention of complication, uncontrolled 04/09/2013   Upper GI bleed 01/13/2020    ALLERGIES:  has No Known Allergies.  MEDICATIONS:  Current Outpatient Medications  Medication Sig Dispense Refill   acetaminophen (TYLENOL) 500 MG tablet Take 1,000 mg by mouth every 6 (six) hours as needed for moderate pain.     atorvastatin (LIPITOR) 20 MG tablet TAKE 1 TABLET BY MOUTH EVERY DAY 90 tablet 3   diltiazem (CARDIZEM CD) 240 MG 24 hr capsule TAKE 1 CAPSULE(240 MG) BY MOUTH DAILY 90 capsule 3   Homeopathic Products (THERAWORX RELIEF EX) Apply 1 application topically daily as needed (cramping).  hydrochlorothiazide (HYDRODIURIL) 25 MG tablet Take 1 tablet (25 mg total) by mouth daily. 90 tablet 3   lisinopril (ZESTRIL) 20 MG tablet TAKE 1 TABLET BY MOUTH EVERY DAY 90 tablet 0   metFORMIN (GLUCOPHAGE-XR) 500 MG 24 hr tablet Take 2 tablets (1,000 mg total) by mouth daily with breakfast. 180 tablet 3   ondansetron (ZOFRAN ODT) 8 MG disintegrating tablet Take 1 tablet (8 mg total) by mouth every 8 (eight) hours as needed for nausea or vomiting. 30 tablet 1   OVER THE COUNTER MEDICATION Take 1 capsule by mouth daily. Super Beets otc supplement     pantoprazole (PROTONIX) 40 MG tablet Take 1  tablet (40 mg total) by mouth 2 (two) times daily before a meal. TAKE 1 TABLET(40 MG) BY MOUTH TWICE DAILY BEFORE A MEAL 180 tablet 0   Prenatal Vit-Fe Fumarate-FA (PRENATAL PO) Take 3 tablets by mouth daily.     triamcinolone cream (KENALOG) 0.1 % Apply 1 application. topically 2 (two) times daily. 30 g 1   trolamine salicylate (ASPERCREME) 10 % cream Apply 1 application topically as needed for muscle pain.     SUNItinib (SUTENT) 37.5 MG capsule TAKE 1 CAPSULE BY MOUTH 1 TIME A DAY (Patient not taking: Reported on 01/25/2022) 30 capsule 1   No current facility-administered medications for this visit.    SURGICAL HISTORY:  Past Surgical History:  Procedure Laterality Date   ABDOMINAL HYSTERECTOMY     APPENDECTOMY     BIOPSY  01/13/2020   Procedure: BIOPSY;  Surgeon: Milus Banister, MD;  Location: [facility] ENDOSCOPY;  Service: Endoscopy;;   BIOPSY  04/13/2020   Procedure: BIOPSY;  Surgeon: Milus Banister, MD;  Location: [facility] ENDOSCOPY;  Service: Endoscopy;;   BIOPSY  08/09/2021   Procedure: BIOPSY;  Surgeon: Milus Banister, MD;  Location: [facility] ENDOSCOPY;  Service: Gastroenterology;;   CHOLECYSTECTOMY     ESOPHAGOGASTRODUODENOSCOPY N/A 08/09/2021   Procedure: ESOPHAGOGASTRODUODENOSCOPY (EGD);  Surgeon: Milus Banister, MD;  Location: [facility] ENDOSCOPY;  Service: Gastroenterology;  Laterality: N/A;   ESOPHAGOGASTRODUODENOSCOPY (EGD) WITH PROPOFOL N/A 01/13/2020   Procedure: ESOPHAGOGASTRODUODENOSCOPY (EGD) WITH PROPOFOL;  Surgeon: Milus Banister, MD;  Location: [facility] ENDOSCOPY;  Service: Endoscopy;  Laterality: N/A;   ESOPHAGOGASTRODUODENOSCOPY (EGD) WITH PROPOFOL N/A 04/13/2020   Procedure: ESOPHAGOGASTRODUODENOSCOPY (EGD) WITH PROPOFOL;  Surgeon: Milus Banister, MD;  Location: [facility] ENDOSCOPY;  Service: Endoscopy;  Laterality: N/A;   ESOPHAGOGASTRODUODENOSCOPY (EGD) WITH PROPOFOL N/A 10/12/2020   Procedure: ESOPHAGOGASTRODUODENOSCOPY (EGD) WITH PROPOFOL;  Surgeon: Milus Banister, MD;  Location: [facility] ENDOSCOPY;   Service: Endoscopy;  Laterality: N/A;   EUS N/A 04/13/2020   Procedure: UPPER ENDOSCOPIC ULTRASOUND (EUS) LINEAR;  Surgeon: Milus Banister, MD;  Location: [facility] ENDOSCOPY;  Service: Endoscopy;  Laterality: N/A;   EUS N/A 10/12/2020   Procedure: UPPER ENDOSCOPIC ULTRASOUND (EUS) RADIAL;  Surgeon: Milus Banister, MD;  Location: [facility] ENDOSCOPY;  Service: Endoscopy;  Laterality: N/A;   EUS N/A 08/09/2021   Procedure: UPPER ENDOSCOPIC ULTRASOUND (EUS) RADIAL;  Surgeon: Milus Banister, MD;  Location: [facility] ENDOSCOPY;  Service: Gastroenterology;  Laterality: N/A;   FINE NEEDLE ASPIRATION N/A 08/09/2021   Procedure: FINE NEEDLE ASPIRATION (FNA) LINEAR;  Surgeon: Milus Banister, MD;  Location: [facility] ENDOSCOPY;  Service: Gastroenterology;  Laterality: N/A;   PARTIAL GASTRECTOMY  02/28/2020    REVIEW OF SYSTEMS:   Review of Systems  Constitutional: Positive for taste changes and weight loss.  Negative for chills, fatigue, and fever. HENT: Positive for taste alterations.  Negative for mouth sores, nosebleeds, sore throat and trouble swallowing.   Eyes: Negative for eye problems and icterus.  Respiratory: Negative for cough, hemoptysis, shortness of breath and wheezing.   Cardiovascular: Negative for chest pain and leg swelling.  Gastrointestinal: Negative for abdominal pain, constipation, diarrhea, nausea and vomiting.  Genitourinary: Negative for bladder incontinence, difficulty urinating, dysuria, frequency and hematuria.   Musculoskeletal: Negative for back pain, gait problem, neck pain and neck stiffness.  Skin: Negative for itching and rash.  Neurological: Negative for dizziness, extremity weakness, gait problem, headaches, light-headedness and seizures.  Hematological: Negative for adenopathy. Does not bruise/bleed easily.  Psychiatric/Behavioral: Negative for confusion, depression and sleep disturbance. The patient is not nervous/anxious.     PHYSICAL EXAMINATION:  Blood pressure (!) 131/96, pulse 72,  temperature 97.7 F (36.5 C), resp. rate 18, weight 160 lb 3.2 oz (72.7 kg), SpO2 98 %.  ECOG PERFORMANCE STATUS: 1  Physical Exam  Constitutional: Oriented to person, place, and time and well-developed, well-nourished, and in no distress.  HENT:  Head: Normocephalic and atraumatic.  Mouth/Throat: Oropharynx is clear and moist. No oropharyngeal exudate.  Eyes: Conjunctivae are normal. Right eye exhibits no discharge. Left eye exhibits no discharge. No scleral icterus.  Neck: Normal range of motion. Neck supple.  Cardiovascular: Normal rate, regular rhythm, normal heart sounds and intact distal pulses.   Pulmonary/Chest: Effort normal and breath sounds normal. No respiratory distress. No wheezes. No rales.  Abdominal: Soft. Bowel sounds are normal. Exhibits no distension and no mass. There is no tenderness.  Musculoskeletal: Normal range of motion. Exhibits no edema.  Lymphadenopathy:    No cervical adenopathy.  Neurological: Alert and oriented to person, place, and time. Exhibits normal muscle tone. Gait normal. Coordination normal.  Skin: Skin is warm and dry. No rash noted. Not diaphoretic. No erythema. No pallor.  Psychiatric: Mood, memory and judgment normal.  Vitals reviewed.  LABORATORY DATA: Lab Results  Component Value Date   WBC 7.2 01/25/2022   HGB 9.9 (L) 01/25/2022   HCT 29.6 (L) 01/25/2022   MCV 110.4 (H) 01/25/2022   PLT 296 01/25/2022      Chemistry      Component Value Date/Time   NA 139 01/25/2022 1033   K 4.3 01/25/2022 1033   CL 103 01/25/2022 1033   CO2 29 01/25/2022 1033   BUN 18 01/25/2022 1033   CREATININE 0.85 01/25/2022 1033   CREATININE 0.81 01/11/2020 1436      Component Value Date/Time   CALCIUM 9.9 01/25/2022 1033   ALKPHOS 60 01/25/2022 1033   AST 15 01/25/2022 1033   ALT 9 01/25/2022 1033   BILITOT 0.5 01/25/2022 1033       RADIOGRAPHIC STUDIES:  MR Abdomen W Wo Contrast  Result Date: 01/21/2022 CLINICAL DATA:  GI stromal tumor  of the stomach with liver lesion. Evaluate treatment response. EXAM: MRI ABDOMEN WITHOUT AND WITH CONTRAST TECHNIQUE: Multiplanar multisequence MR imaging of the abdomen was performed both before and after the administration of intravenous contrast. CONTRAST:  32m GADAVIST GADOBUTROL 1 MMOL/ML IV SOLN COMPARISON:  07/27/2021 FINDINGS: Portions of exam are minimally motion degraded. Lower chest: Normal heart size without pericardial or pleural effusion. Tiny hiatal hernia. Hepatobiliary: Development of bilateral hepatic metastasis. Example high right hepatic lobe lesion of 2.1 x 1.9 cm on 11/04. Index segment 3 2.0 x 1.7 cm lesion on 19/4. On the order of 30 lesions total. Cholecystectomy, without biliary ductal dilatation. Pancreas:  Normal, without mass or ductal dilatation. Spleen:  Normal in size, without focal abnormality. Adrenals/Urinary Tract: Mild left adrenal thickening. Normal right adrenal gland. Bilateral renal cysts on the order of 1.0 cm. Stomach/Bowel: Multiple enhancing gastric masses. The gastric cardia mass measures 4.5 x 3.8 cm on 32/16 versus 2.7 x 2.3 cm on the prior MRI. Greater curvature gastric body wall mass measures 4.8 x 5.7 cm on 58/16 versus 3.6 x 2.5 cm on the prior exam. This demonstrates direct extension anteriorly into the gastrocolic ligament and greater omentum including on 57/16. A gastric fundal mass measures 2.0 x 1.9 cm on 57/16 and is new since the prior MRI. No gastric outlet obstruction. Normal caliber of large and small bowel loops. Vascular/Lymphatic: Aortic atherosclerosis. Patent portal and splenic veins. No retroperitoneal or retrocrural adenopathy. Other: No ascites. A greater omental enhancing 1.7 by 0.9 cm implant on 68/16. Musculoskeletal: No acute osseous abnormality. Cystic lesion within the left breast of 2.7 cm was present on the prior exam and could represent a cyst or seroma if there has been prior instrumentation or surgery. IMPRESSION: 1. Marked progression  of disease, as evidenced by new and enlarging gastric masses, extensive hepatic metastasis, and peritoneal spread. 2. No gastric obstruction or other acute complication. Electronically Signed   By: Abigail Miyamoto M.D.   On: 01/21/2022 08:45     ASSESSMENT/PLAN:  GRASIELA JONSSON is a 76 y.o. female with    1. Gastric GIST, ypT3NxM0,  High grade, Kit mutation (+),local recurrence in 07/2021 -She was diagnosed in 01/2020 with High grade gastrointestinal stromal tumor.  -Her staging CT CAP from 01/13/20 did not show gastric mass (Dr. Burr Medico thinks the greater curvature of gastric wall is thickened) but did show incidental findings of left breast mass and 6.40m sclerotic lesion at T7.  -Given her bleeding tumor, large size and other work up, Dr. FBurr Medicostarted her on [id number] 01/27/20, prior to surgery.  -s/p resection on 02/29/20 with Dr ByBarry DienesPath showed: high-grade GIST spanning 8 cm infiltrating through most of the distal gastric wall, with a positive distal resection margin, staged pT3; areas of rhabdomyosarcomatous differentiation but is not felt to represent a mixed rhabdomyosarcoma/GIST tumor. Unfortunately, given these features she is at very high risk for recurrence and metastasis.  -CT AP from 07/02/21 showed no evidence of recurrence.  She has a stable 15 mm liver lesion which has been stable, likely benign. -liver MRI on 07/26/21 showed: two enhancing gastric wall masses, concerning for tumor recurrence; a third enhancing soft tissue nodularity at GEPomonaunction/within distal-most esophagus, indeterminate. -repeat EUS on 08/09/21 with Dr. JaArdis Hughshowed the two stomach masses. Pathology confirmed spindle cell proliferation consistent with GIST. -she met with Dr. ByBarry Dienesn 08/24/21 to discuss surgery. They discussed that she would require a total gastrectomy, which is a big surgery. She is reluctant to consider surgery and declined  -she switched to sunitinib on 09/03/21; She started at 50 mg daily 4 weeks  on, 2 weeks off. She developed some stomach discomfort and fatigue, and Dr. FeBurr Medicoecreased her dose to 37.5m37maily. After 5-6 weeks treatment, she developed a severe diarrhea, and stopped treatment on her own.  The patient estimates she started this approximately 1 week before her appointment with Dr. FenBurr Medico 12/14/2021.  Dr. FenBurr Medicoen discussed dose adjustment, she did not want to try the high dose again but was willing to take Sunitinib 37.5 mg daily, 3 weeks on and 1 week off.  The patient never restarted this because she was "feeling too  good". -She had repeat MRI on 01/20/22, this unfortunately showed marked progression of disease with new and enlarging gastric masses, extensive hepatic metastases, and peritoneal spread. -Today, 01/25/2022, I discussed with the patient that the MRI showed concern for progression.  I also reviewed this with Dr. Burr Medico.  Unclear if the progression is due to feeling her current treatment or because she was off treatment for several weeks.   -Dr. Burr Medico recommends arranging for a dotatate PET scan.  I have placed the order.  Dr. Burr Medico will then decide if biopsy is necessary based on this result.   -The meantime, Dr. Burr Medico recommended that the patient restart her treatment if she is agreeable.  The patient states she has a refill and will call the pharmacy upon returning home today.  The patient was in agreement to restart her Sunitinib.  -Dr. Burr Medico will then see the patient back for a follow-up visit in 2 to 3 weeks after she has her PET scan to review the results.  Of course if the patient has any significant adverse side effects to her treatment she was advised to call the clinic immediately for further instructions.   -Her CBC and CMP are unremarkable today.  She continues to have stable/slightly improving anemia. -Patient was asking about her prognosis today.  She is concerned about who will take care of her husband if something happened to her.  Per Dr. Burr Medico, average life  expectancy with her condition is 2 to 3 years.   2. Anemia secondary to GI bleeding from #1, B12 deficiency  -She was hospitalized on 01/12/20 due to severe anemia with Hg 7.9.  -She was treated with blood transfusion on 01/17/20 and 03/06/20 and IV Feraheme on 01/15/20 and 02/09/20 -she was previously on monthly B12 injections, lost to f/u 12/2020-02/2021, restarted B12 injections 03/23/21 q6weeks. Last B12 given on 12/14/21, due today -Unfortunately, the patient was scheduled for B12 injection today.  She left the clinic before this can be administered.  I will arrange for her to receive this injection upon her next follow-up visit.   3.  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 -The patient was asking about her prognosis today as she would like time to find a caregiver for her husband.  The patient does not want to put her husband in a nursing home. -I encouraged the patient to reach out to her husband's PCP for recommendations.  Additionally I will reach out to the social worker to see if she has any resources that we can share with the patient.     PLAN:  -Patient left before receiving her B12 injection today.  We will arrange for her to receive her injection at her next appointment -Order dotatate PET scan -Restart Sunitinib -Follow with Dr. Burr Medico in 2 to 3 weeks after getting PET scan to review the results. -Reach out to social work for recommendations   Orders Placed This Encounter  Procedures   NM PET (CU-64 DETECTNET)SKULL TO MID THIGH    Standing Status:   Future    Standing Expiration Date:   01/26/2023    Order Specific Question:   If indicated for the ordered procedure, I authorize the administration of a radiopharmaceutical per Radiology protocol    Answer:   Yes    Order Specific Question:   Preferred imaging location?    Answer:   [facility]   CBC with Differential (Cancer Center Only)    Standing Status:  Future    Standing Expiration  Date:   01/26/2023   CMP ([facility] only)    Standing Status:   Future    Standing Expiration Date:   01/26/2023    The total time spent in the appointment was 30-39 minutes.   Lilliemae Fruge L Surah Pelley, PA-C 01/25/22

## 2022-01-25 ENCOUNTER — Inpatient Hospital Stay: Payer: Medicare Other

## 2022-01-25 ENCOUNTER — Encounter: Payer: Self-pay | Admitting: Physician Assistant

## 2022-01-25 ENCOUNTER — Other Ambulatory Visit: Payer: Self-pay

## 2022-01-25 ENCOUNTER — Inpatient Hospital Stay: Payer: Medicare Other | Attending: Hematology | Admitting: Physician Assistant

## 2022-01-25 ENCOUNTER — Other Ambulatory Visit: Payer: Self-pay | Admitting: Physician Assistant

## 2022-01-25 VITALS — BP 131/96 | HR 72 | Temp 97.7°F | Resp 18 | Wt 160.2 lb

## 2022-01-25 DIAGNOSIS — E538 Deficiency of other specified B group vitamins: Secondary | ICD-10-CM | POA: Insufficient documentation

## 2022-01-25 DIAGNOSIS — K922 Gastrointestinal hemorrhage, unspecified: Secondary | ICD-10-CM | POA: Diagnosis not present

## 2022-01-25 DIAGNOSIS — C49A0 Gastrointestinal stromal tumor, unspecified site: Secondary | ICD-10-CM

## 2022-01-25 DIAGNOSIS — D500 Iron deficiency anemia secondary to blood loss (chronic): Secondary | ICD-10-CM | POA: Diagnosis not present

## 2022-01-25 DIAGNOSIS — C787 Secondary malignant neoplasm of liver and intrahepatic bile duct: Secondary | ICD-10-CM | POA: Diagnosis not present

## 2022-01-25 DIAGNOSIS — Z1329 Encounter for screening for other suspected endocrine disorder: Secondary | ICD-10-CM

## 2022-01-25 DIAGNOSIS — Z79899 Other long term (current) drug therapy: Secondary | ICD-10-CM | POA: Diagnosis not present

## 2022-01-25 DIAGNOSIS — C49A2 Gastrointestinal stromal tumor of stomach: Secondary | ICD-10-CM | POA: Diagnosis not present

## 2022-01-25 LAB — CMP (CANCER CENTER ONLY)
ALT: 9 U/L (ref 0–44)
AST: 15 U/L (ref 15–41)
Albumin: 4.1 g/dL (ref 3.5–5.0)
Alkaline Phosphatase: 60 U/L (ref 38–126)
Anion gap: 7 (ref 5–15)
BUN: 18 mg/dL (ref 8–23)
CO2: 29 mmol/L (ref 22–32)
Calcium: 9.9 mg/dL (ref 8.9–10.3)
Chloride: 103 mmol/L (ref 98–111)
Creatinine: 0.85 mg/dL (ref 0.44–1.00)
GFR, Estimated: >60 mL/min (ref >60)
Glucose, Bld: 115 mg/dL — ABNORMAL HIGH (ref 70–99)
Potassium: 4.3 mmol/L (ref 3.5–5.1)
Sodium: 139 mmol/L (ref 135–145)
Total Bilirubin: 0.5 mg/dL (ref 0.3–1.2)
Total Protein: 7 g/dL (ref 6.5–8.1)

## 2022-01-25 LAB — CBC WITH DIFFERENTIAL (CANCER CENTER ONLY)
Abs Immature Granulocytes: 0.11 10*3/uL — ABNORMAL HIGH (ref 0.00–0.07)
Basophils Absolute: 0 10*3/uL (ref 0.0–0.1)
Basophils Relative: 1 %
Eosinophils Absolute: 0.2 10*3/uL (ref 0.0–0.5)
Eosinophils Relative: 3 %
HCT: 29.6 % — ABNORMAL LOW (ref 36.0–46.0)
Hemoglobin: 9.9 g/dL — ABNORMAL LOW (ref 12.0–15.0)
Immature Granulocytes: 2 %
Lymphocytes Relative: 16 %
Lymphs Abs: 1.2 10*3/uL (ref 0.7–4.0)
MCH: 36.9 pg — ABNORMAL HIGH (ref 26.0–34.0)
MCHC: 33.4 g/dL (ref 30.0–36.0)
MCV: 110.4 fL — ABNORMAL HIGH (ref 80.0–100.0)
Monocytes Absolute: 0.8 10*3/uL (ref 0.1–1.0)
Monocytes Relative: 12 %
Neutro Abs: 4.9 10*3/uL (ref 1.7–7.7)
Neutrophils Relative %: 66 %
Platelet Count: 296 10*3/uL (ref 150–400)
RBC: 2.68 MIL/uL — ABNORMAL LOW (ref 3.87–5.11)
RDW: 12.4 % (ref 11.5–15.5)
WBC Count: 7.2 10*3/uL (ref 4.0–10.5)
nRBC: 0 % (ref 0.0–0.2)

## 2022-01-25 LAB — RETIC PANEL
Immature Retic Fract: 28.9 % — ABNORMAL HIGH (ref 2.3–15.9)
RBC.: 2.67 MIL/uL — ABNORMAL LOW (ref 3.87–5.11)
Retic Count, Absolute: 60.6 10*3/uL (ref 19.0–186.0)
Retic Ct Pct: 2.3 % (ref 0.4–3.1)
Reticulocyte Hemoglobin: 33.4 pg (ref >27.9)

## 2022-01-25 LAB — TSH: TSH: 3.874 u[IU]/mL (ref 0.350–4.500)

## 2022-01-25 LAB — FERRITIN: Ferritin: 158 ng/mL (ref 11–307)

## 2022-01-25 LAB — T4, FREE: Free T4: 0.86 ng/dL (ref 0.61–1.12)

## 2022-01-28 ENCOUNTER — Ambulatory Visit: Payer: Medicare Other

## 2022-01-28 ENCOUNTER — Other Ambulatory Visit: Payer: Medicare Other

## 2022-01-28 ENCOUNTER — Ambulatory Visit: Payer: Medicare Other | Admitting: Hematology

## 2022-01-28 ENCOUNTER — Inpatient Hospital Stay: Payer: Medicare Other | Admitting: Licensed Clinical Social Worker

## 2022-01-28 DIAGNOSIS — C49A0 Gastrointestinal stromal tumor, unspecified site: Secondary | ICD-10-CM

## 2022-01-28 NOTE — Progress Notes (Signed)
[facility]  Initial Assessment   Bridget Grant is a 76 y.o. year old female contacted by phone. Clinical Social Work was referred by medical provider for assessment of psychosocial needs.   SDOH (Social Determinants of Health) assessments performed: Yes   SDOH Screenings   Alcohol Screen: Low Risk  (12/05/2021)   Alcohol Screen    Last Alcohol Screening Score (AUDIT): 0  Depression (PHQ2-9): Low Risk  (12/05/2021)   Depression (PHQ2-9)    PHQ-2 Score: 0  Financial Resource Strain: Low Risk  (12/05/2021)   Overall Financial Resource Strain (CARDIA)    Difficulty of Paying Living Expenses: Not hard at all  Food Insecurity: No Food Insecurity (12/05/2021)   Hunger Vital Sign    Worried About Running Out of Food in the Last Year: Never true    Alger in the Last Year: Never true  Housing: Low Risk  (12/05/2021)   Housing    Last Housing Risk Score: 0  Physical Activity: Sufficiently Active (12/05/2021)   Exercise Vital Sign    Days of Exercise per Week: 5 days    Minutes of Exercise per Session: 30 min  Social Connections: Unknown (12/05/2021)   Social Connection and Isolation Panel [NHANES]    Frequency of Communication with Friends and Family: More than three times a week    Frequency of Social Gatherings with Friends and Family: More than three times a week    Attends Religious Services: Patient refused    Active Member of Clubs or Organizations: Patient refused    Attends Archivist Meetings: Patient refused    Marital Status: Married  Stress: No Stress Concern Present (12/05/2021)   Jessie    Feeling of Stress : Not at all  Tobacco Use: Low Risk  (01/25/2022)   Patient History    Smoking Tobacco Use: Never    Smokeless Tobacco Use: Never    Passive Exposure: Not on file  Transportation Needs: No Transportation Needs (12/05/2021)   PRAPARE - Radiographer, therapeutic (Medical): No    Lack of Transportation (Non-Medical): No     Distress Screen completed: No     No data to display            Family/Social Information:  Housing Arrangement: patient lives with spouse,  who is a paraplegic due to a tumor which was removed from the spine a number of years ago, whom pt is the primary caretaker for. Family members/support persons in your life? Pt and spouse are reliant on one another.  Pt does have a brother who is sometimes able to assist, but day to day it is primary just pt and her spouse. Transportation concerns: no  Employment: Retired pt's spouse is retired from the post office.  Income source: Pension and [place] concerns:  Not for day to day expenses, but for long term planning regarding the care pt's spouse will require should pt pass away. Type of concern:  long term care Food access concerns: no Religious or spiritual practice: Yes-Christian Services Currently in place:  none  Coping/ Adjustment to diagnosis: Patient understands treatment plan and what happens next? yes Concerns about diagnosis and/or treatment: How will I care for myself Patient reported stressors: Adjusting to my illness, Facing my mortality, and ensuring spouse receives the care he needs given his physical limitations should pt pass prior to her. Hopes and/or priorities: Pt's  priority is to set up a long term plan w/ the hope of the  Patient enjoys  Current coping skills/ strengths: Capable of independent living  and Physical Health     SUMMARY: Current SDOH Barriers:  Limited social support  Clinical Social Work Clinical Goal(s):  No clinical social work goals at this time  Interventions: Discussed common feeling and emotions when being diagnosed with cancer, and the importance of support during treatment Informed patient of the support team roles and support services at [facility] Provided [place] contact information and  encouraged patient to call with any questions or concerns CSW discussed long term care planning at length with pt with regards to different options and ensuring should anything happen to pt, her spouse is set up with adequate support.  Advanced directives and assignment of a HCP for spouse also discussed as the couple does not have family close by with the exception of pt's brother.  Pt given the telephone number for [facility] as a resource to explore long term care options.   Follow Up Plan: Patient will contact CSW with any support or resource needs Patient verbalizes understanding of plan: Yes    Henriette Combs, LCSW

## 2022-01-29 ENCOUNTER — Ambulatory Visit: Payer: Medicare Other | Admitting: Hematology

## 2022-01-29 ENCOUNTER — Other Ambulatory Visit: Payer: Medicare Other

## 2022-01-29 ENCOUNTER — Ambulatory Visit: Payer: Medicare Other

## 2022-02-14 ENCOUNTER — Ambulatory Visit (HOSPITAL_COMMUNITY)
Admission: RE | Admit: 2022-02-14 | Discharge: 2022-02-14 | Disposition: A | Discharge status: Home / Self Care | Payer: Medicare Other | Source: Ambulatory Visit | Attending: Physician Assistant | Admitting: Physician Assistant

## 2022-02-14 DIAGNOSIS — C49A0 Gastrointestinal stromal tumor, unspecified site: Secondary | ICD-10-CM | POA: Diagnosis not present

## 2022-02-14 DIAGNOSIS — C787 Secondary malignant neoplasm of liver and intrahepatic bile duct: Secondary | ICD-10-CM | POA: Insufficient documentation

## 2022-02-14 MED ORDER — COPPER CU 64 DOTATATE 1 MCI/ML IV SOLN
4.0000 | Freq: Once | INTRAVENOUS | Status: AC
  Administered 2022-02-14: 4.27 via INTRAVENOUS

## 2022-02-15 ENCOUNTER — Other Ambulatory Visit: Payer: Self-pay

## 2022-02-15 ENCOUNTER — Inpatient Hospital Stay: Payer: Medicare Other

## 2022-02-15 ENCOUNTER — Other Ambulatory Visit (HOSPITAL_COMMUNITY): Payer: Self-pay

## 2022-02-15 ENCOUNTER — Encounter: Payer: Self-pay | Admitting: Hematology

## 2022-02-15 ENCOUNTER — Inpatient Hospital Stay: Payer: Medicare Other | Attending: Hematology | Admitting: Hematology

## 2022-02-15 VITALS — BP 158/71 | HR 74 | Temp 98.8°F | Resp 15 | Wt 159.3 lb

## 2022-02-15 DIAGNOSIS — E538 Deficiency of other specified B group vitamins: Secondary | ICD-10-CM

## 2022-02-15 DIAGNOSIS — D500 Iron deficiency anemia secondary to blood loss (chronic): Secondary | ICD-10-CM

## 2022-02-15 DIAGNOSIS — C49A2 Gastrointestinal stromal tumor of stomach: Secondary | ICD-10-CM | POA: Diagnosis not present

## 2022-02-15 MED ORDER — CYANOCOBALAMIN 1000 MCG/ML IJ SOLN
1000.0000 ug | Freq: Once | INTRAMUSCULAR | Status: AC
  Administered 2022-02-15: 1000 ug via INTRAMUSCULAR
  Filled 2022-02-15: qty 1

## 2022-02-15 MED ORDER — SUNITINIB MALATE 37.5 MG PO CAPS: ORAL_CAPSULE | ORAL | 1 refills | Status: DC

## 2022-02-15 NOTE — Progress Notes (Signed)
[name]   [phone] [phone]   Clinic Follow up Note   Patient Care Team: [name], MD as PCP - Charissa Bash, MD as Consulting Physician (Oncology) Jonnie Finner, RN (Inactive) as Oncology Nurse Navigator Stark Klein, MD as Consulting Physician (General Surgery) Milus Banister, MD as Attending Physician (Gastroenterology)  Date of Service:  02/15/2022  CHIEF COMPLAINT: f/u of GIST  CURRENT THERAPY:  -Sunitinib, started 09/03/21, currently 37.5 mg daily -B12 injection, currently q6weeks  ASSESSMENT & PLAN:  Bridget Grant is a 76 y.o. female with   1. Gastric GIST, [id number],  High grade, Kit mutation (+), local recurrence in 07/2021, liver metastasis in 01/2022 -diagnosed in 01/2020. Staging CT CAP from 01/13/20 did not show gastric mass but did show incidental findings of left breast mass and 6.46m sclerotic lesion at T7.  -she started neoadjuvant Gleevec 4031mon 01/27/20, prior to surgery.  -s/p resection on 02/29/20 with Dr ByBarry DienesPath showed: high-grade GIST spanning 8 cm infiltrating through most of the distal gastric wall, with a positive distal resection margin, staged pT3. Given these features she is at very high risk for recurrence and metastasis.  -she developed local recurrence in 07/2021 -she met with Dr. ByBarry Dienesn 08/24/21 to discuss surgery. They discussed that she would require a total gastrectomy, which is a big surgery. She is reluctant to consider surgery and declined  -she switched to sunitinib on 09/03/21; She started at 50 mg daily 4 weeks on, 2 weeks off. She developed some stomach discomfort and fatigue, and I decreased her dose to 37.1m57maily starting 10/16/21. After 5-6 weeks treatment, she developed a severe diarrhea, and stopped treatment around 12/08/21. At visit on 12/14/21, she agreed to restart Sutent 37.5 mg daily, 3 weeks on and 1 week off, but she never restarted. -repeat MRI 01/20/22 showed marked disease  progression with liver metastasis. She restarted Sutent following visit on 01/25/22. -DOTATATE PET on 02/14/22 showed: evidence of tumor recurrence with local extension and multiple liver metastasis, not avid for somatostatin receptor. I reviewed the results with her today. While this was the incorrect type of PET scan, which was a wrong order by mistake, it still definitively shows disease progression. -I again explained that it is unclear if the progression is due to failing her current treatment or because she was off treatment for several weeks. I discussed that this is no longer curable but remains very treatable. I recommend continuing Sutent for a few more months, if she can tolerate, then rescanning her. If she has any significant adverse side effects to her treatment, she was advised to call the clinic immediately for further instructions.     2. Anemia secondary to GI bleeding from #1, B12 deficiency  -She was hospitalized on 01/12/20 due to severe anemia with Hg 7.9.  -She was treated with blood transfusion and IV Feraheme in 8-02/2020 -she restarted B12 injections 03/23/21 q6weeks. Last B12 given on 12/14/21, due today.    3.  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 -she is in touch with our socEducation officer, museum   PLAN:  -B12 injection today -continue Sunitinib at 37.1mg53mily  -lab and f/u in 1 month, will order restaging scan on next visit    No problem-specific Assessment & Plan notes found for this encounter.   SUMMARY OF ONCOLOGIC HISTORY: Oncology History Overview Note  Cancer Staging Malignant gastrointestinal stromal tumor (GIST) of  stomach ([place]) Staging form: Gastrointestinal Stromal Tumor - Gastric and Omental GIST, AJCC 8th Edition - Pathologic stage from 02/29/2020: pT3, Mitotic Rate: High - Unsigned Histologic grade (G): High grade Histologic grading system: 2 grade system Location of the gastrointestinal stromal tumor: Stomach     Malignant gastrointestinal stromal tumor (GIST) of stomach ([place])  01/13/2020 Procedure   EGD/Upper Endoscopy by Dr Ardis Hughs 01/13/20  IMPRESSION - There was an ulcerated mass along the greater curvature of the stomach, proximal edge was about 5-6cm from the GE junction and the mass was 7-8cm long, 2-3cm wide. It was not circumferental. The mass was friable with spontaneous oozing, contact oozing, ulcerated and heaped up. Some parts of the mass were quite soft whereas other areas were more firm. This was very suspicious for a malignancy and it was biopsied extensively. There was mild distal gastritis, biopsied to check for H. pylori. - The examination was otherwise normal.   01/13/2020 Initial Biopsy   FINAL MICROSCOPIC DIAGNOSIS:  A. STOMACH, BIOPSY:  - High grade gastrointestinal stromal tumor, see comment.   B. STOMACH, DISTAL, BIOPSY:  - Mild reactive gastropathy with erosions.  - Warthin-Starry is negative for Helicobacter pylori.  - No intestinal metaplasia, dysplasia, or malignancy.  COMMENT:  A. The tumor has spindle and epithelioid components and appears high  grade (7 mitosis/20 hpf). Immunohistochemistry is positive for CD117 and  CD34. The cells are negative for cytokeratin 20, cytokeratin 7,  cytokeratin 5/6, CDX-2, SMA, and desmin. The findings are consistent  with a gastrointestinal stromal tumor. Dr. Vic Ripper has reviewed the  case. Dr. Ardis Hughs' nurse was notified on 01/18/2020.     01/13/2020 Imaging   CT CAP W contrast 01/13/20  IMPRESSION: 1. 2.1 cm x 1.1 cm well-defined area of low attenuation within the soft tissues of the left breast. This may represent a lymph node, however, correlation with mammography is recommended. 2. 6.5 mm sclerotic focus within the posterior aspect of the T7 vertebral body. This may represent a bone island, however, correlation with follow-up abdomen pelvis CT is recommended to exclude the presence of a metastatic lesion. 3. Small hiatal  hernia. 4. 2.6 cm x 2.0 cm fat containing umbilical hernia. 5. Aortic atherosclerosis.    01/23/2020 Initial Diagnosis   Malignant gastrointestinal stromal tumor (GIST) of stomach ([place])   01/26/2020 -  Chemotherapy   Gleevec 491m once daily starting 01/26/20    02/11/2020 PET scan   IMPRESSION: 1. Focal mass along the stomach fundus with maximum SUV 15.7, compatible with malignancy. No findings of metastatic spread. 2. Mildly increased sub solid nodularity in the left upper lobe with only very subtle associated activity, likely inflammatory. Incidental note is made of some secondary pulmonary lobular septal thickening in the lung apices, and mild interstitial edema is not excluded given the underlying cardiomegaly. 3. Other imaging findings of potential clinical significance: Aortic Atherosclerosis (ICD10-I70.0). Coronary atherosclerosis. Sigmoid colon diverticulosis. Small umbilical hernia contains adipose tissue. Physiologic activity along the costovertebral joints.   02/29/2020 Surgery   Partial gastrectomy by Dr. BBarry Dienes   02/29/2020 Pathology Results   FINAL MICROSCOPIC DIAGNOSIS:  A. STOMACH, PARTIAL GASTRECTOMY:  - High grade gastrointestinal stromal tumor with dedifferentiation,  spanning approximately 8 cm  - Tumor infiltrates through most of the distal gastric wall and involves  the distal resection margin  - See oncology table  - See comment  Immunohistochemical stains show that the tumor cells are positive for  CD117, CD34 and SMA (patchy).  A component of the  tumor shows very  high-grade features with loss of CD117 and CD34 expression, consistent  with dedifferentiation.  Tumor also shows areas with  rhabdomyosarcomatous differentiation with presence of desmin expression.    04/13/2020 Procedure   EUS  by Dr Ardis Hughs  IMPRESSION - The staple line from recent partial gastrectomy was noted in the proximal stomach, runs longitudinally along the gastric fundus. The  mucosa immediately around the stable line was normal appearing however in the more distal gastric body (anterior wall likely) the mucosa was quite irregular appearing; friable, firm, slightly ulcerated for 4-5cm at least. This was not a discrete tumor, and these changes were about 5cm from the pylorus. I sampled this region extensively with forceps following EUS evaluation below.- The muscularis propria was abnormally thickened starting around the staple line and continuing throughout most of the stomach distally. This thickening was not circumferential.     FINAL MICROSCOPIC DIAGNOSIS:   A. GASTRIC BODY, BIOPSY:  - Gastric mucosa with changes consistent with reactive gastropathy.  - No spindle cell lesion, intestinal metaplasia, dysplasia or carcinoma.     10/12/2020 Procedure   EUS by Dr. Ardis Hughs  Impression: - The overall endoscopic appearance of the stomach, s/p wedge resection was [facility] IMPROVED from 04/2020 EGD/EUS. The wedge 'anastomosis' was normal appearing. The previously ulcerated mucosa was normal now. There was very mild, non-specific pangastritis. Given the significantly improved appearance, no biopsies were taken. EUS evaluation reveals expected anatomically abnormal findings at the wedge resection site (related to post operative changes) but no significanly thickened gastric layers or masses.   07/02/2021 Imaging   EXAM: CT CHEST, ABDOMEN, AND PELVIS WITH CONTRAST  IMPRESSION: 1. Stable exam. No new or progressive findings to suggest recurrent or metastatic disease. 2. Stable 15 mm hypoattenuating lesion in the lateral segment left liver, likely benign. Continued attention on follow-up recommended. 3. Small hiatal hernia. 4. Left colonic diverticulosis without diverticulitis. 5. Aortic Atherosclerosis (ICD10-I70.0).   07/26/2021 Imaging   EXAM: MRI ABDOMEN WITHOUT AND WITH CONTRAST  IMPRESSION: 1. Two enhancing gastric wall masses identified as described, concerning  for tumor recurrence. A third enhancing soft tissue nodularity is visualized at the gastroesophageal junction/within the distal most esophagus which could represent tumor or postsurgical change. Consider upper endoscopy. 2. Lesion in the left hepatic lobe seen on prior CT scans is not well visualized, not definitely enhancing. 3. Other ancillary findings as described.   08/09/2021 Pathology Results   FINAL MICROSCOPIC DIAGNOSIS:   A. STOMACH, DISTAL MASS, BIOPSY:  - Spindle cell proliferation consistent with gastrointestinal stromal  tumor (GIST).  - See comment.   COMMENT:  There are fragments of gastric mucosa and fragments composed of spindle cell proliferation with intervening collagenous stroma.  Immunohistochemistry shows strong positivity with CD117 and patchy positivity with CD34.  Cytokeratin AE1/AE3 is negative in the spindle cell proliferation.  Ki-67 shows a high proliferation index.  The findings are consistent with gastrointestinal stromal tumor (GIST).   Specimen Submitted:  A. GASTRIC, PROXIMAL SUBMUCOSAL MASS, FINE NEEDLE ASPIRATION:  FINAL MICROSCOPIC DIAGNOSIS:  - Spindle cell proliferation    08/09/2021 Procedure   Upper EUS, Dr. Ardis Hughs  Impression: - There were 2 masses in the stomach. The proximal mass was located about the 3 cm distal to the GE junction, measured 2.8 cm maximally, it was completely submucosal. I sampled this mass with EUS, FNB. The distal mass 3 cm maximally, it was located at the distal aspect of the previous wedge resection "anastomosis." I sampled this mass with  mucosal biopsies as there was clearly a mucosal component.      INTERVAL HISTORY:  Bridget Grant is here for a follow up of now metastatic GIST. She was last seen by PA Cassie on 01/25/22. She presents to the clinic alone.   All other systems were reviewed with the patient and are negative.  MEDICAL HISTORY:  Past Medical History:  Diagnosis Date   Anxiety    Cancer ([place]) 2021    stomach   Complication of anesthesia    Depression    Hyperlipidemia    Hypertension    Migraine    Osteopenia    PONV (postoperative nausea and vomiting)    Type II or unspecified type diabetes mellitus without mention of complication, uncontrolled 04/09/2013   Upper GI bleed 01/13/2020    SURGICAL HISTORY: Past Surgical History:  Procedure Laterality Date   ABDOMINAL HYSTERECTOMY     APPENDECTOMY     BIOPSY  01/13/2020   Procedure: BIOPSY;  Surgeon: Milus Banister, MD;  Location: [facility] ENDOSCOPY;  Service: Endoscopy;;   BIOPSY  04/13/2020   Procedure: BIOPSY;  Surgeon: Milus Banister, MD;  Location: [facility] ENDOSCOPY;  Service: Endoscopy;;   BIOPSY  08/09/2021   Procedure: BIOPSY;  Surgeon: Milus Banister, MD;  Location: [facility] ENDOSCOPY;  Service: Gastroenterology;;   CHOLECYSTECTOMY     ESOPHAGOGASTRODUODENOSCOPY N/A 08/09/2021   Procedure: ESOPHAGOGASTRODUODENOSCOPY (EGD);  Surgeon: Milus Banister, MD;  Location: [facility] ENDOSCOPY;  Service: Gastroenterology;  Laterality: N/A;   ESOPHAGOGASTRODUODENOSCOPY (EGD) WITH PROPOFOL N/A 01/13/2020   Procedure: ESOPHAGOGASTRODUODENOSCOPY (EGD) WITH PROPOFOL;  Surgeon: Milus Banister, MD;  Location: [facility] ENDOSCOPY;  Service: Endoscopy;  Laterality: N/A;   ESOPHAGOGASTRODUODENOSCOPY (EGD) WITH PROPOFOL N/A 04/13/2020   Procedure: ESOPHAGOGASTRODUODENOSCOPY (EGD) WITH PROPOFOL;  Surgeon: Milus Banister, MD;  Location: [facility] ENDOSCOPY;  Service: Endoscopy;  Laterality: N/A;   ESOPHAGOGASTRODUODENOSCOPY (EGD) WITH PROPOFOL N/A 10/12/2020   Procedure: ESOPHAGOGASTRODUODENOSCOPY (EGD) WITH PROPOFOL;  Surgeon: Milus Banister, MD;  Location: [facility] ENDOSCOPY;  Service: Endoscopy;  Laterality: N/A;   EUS N/A 04/13/2020   Procedure: UPPER ENDOSCOPIC ULTRASOUND (EUS) LINEAR;  Surgeon: Milus Banister, MD;  Location: [facility] ENDOSCOPY;  Service: Endoscopy;  Laterality: N/A;   EUS N/A 10/12/2020   Procedure: UPPER ENDOSCOPIC ULTRASOUND (EUS) RADIAL;  Surgeon: Milus Banister,  MD;  Location: [facility] ENDOSCOPY;  Service: Endoscopy;  Laterality: N/A;   EUS N/A 08/09/2021   Procedure: UPPER ENDOSCOPIC ULTRASOUND (EUS) RADIAL;  Surgeon: Milus Banister, MD;  Location: [facility] ENDOSCOPY;  Service: Gastroenterology;  Laterality: N/A;   FINE NEEDLE ASPIRATION N/A 08/09/2021   Procedure: FINE NEEDLE ASPIRATION (FNA) LINEAR;  Surgeon: Milus Banister, MD;  Location: [facility] ENDOSCOPY;  Service: Gastroenterology;  Laterality: N/A;   PARTIAL GASTRECTOMY  02/28/2020    I have reviewed the social history and family history with the patient and they are unchanged from previous note.  ALLERGIES:  has No Known Allergies.  MEDICATIONS:  Current Outpatient Medications  Medication Sig Dispense Refill   acetaminophen (TYLENOL) 500 MG tablet Take 1,000 mg by mouth every 6 (six) hours as needed for moderate pain.     atorvastatin (LIPITOR) 20 MG tablet TAKE 1 TABLET BY MOUTH EVERY DAY 90 tablet 3   diltiazem (CARDIZEM CD) 240 MG 24 hr capsule TAKE 1 CAPSULE(240 MG) BY MOUTH DAILY 90 capsule 3   Homeopathic Products (THERAWORX RELIEF EX) Apply 1 application topically daily as needed (cramping).     hydrochlorothiazide (HYDRODIURIL) 25 MG tablet Take 1  tablet (25 mg total) by mouth daily. 90 tablet 3   lisinopril (ZESTRIL) 20 MG tablet TAKE 1 TABLET BY MOUTH EVERY DAY 90 tablet 0   metFORMIN (GLUCOPHAGE-XR) 500 MG 24 hr tablet Take 2 tablets (1,000 mg total) by mouth daily with breakfast. 180 tablet 3   ondansetron (ZOFRAN ODT) 8 MG disintegrating tablet Take 1 tablet (8 mg total) by mouth every 8 (eight) hours as needed for nausea or vomiting. 30 tablet 1   OVER THE COUNTER MEDICATION Take 1 capsule by mouth daily. Super Beets otc supplement     pantoprazole (PROTONIX) 40 MG tablet Take 1 tablet (40 mg total) by mouth 2 (two) times daily before a meal. TAKE 1 TABLET(40 MG) BY MOUTH TWICE DAILY BEFORE A MEAL 180 tablet 0   Prenatal Vit-Fe Fumarate-FA (PRENATAL PO) Take 3 tablets by mouth daily.      SUNItinib (SUTENT) 37.5 MG capsule TAKE 1 CAPSULE BY MOUTH 1 TIME A DAY 30 capsule 1   triamcinolone cream (KENALOG) 0.1 % Apply 1 application. topically 2 (two) times daily. 30 g 1   trolamine salicylate (ASPERCREME) 10 % cream Apply 1 application topically as needed for muscle pain.     No current facility-administered medications for this visit.    PHYSICAL EXAMINATION: ECOG PERFORMANCE STATUS: 1 - Symptomatic but completely ambulatory  Vitals:   02/15/22 1414  BP: (!) 158/71  Pulse: 74  Resp: 15  Temp: 98.8 F (37.1 C)  SpO2: 100%   Wt Readings from Last 3 Encounters:  02/15/22 159 lb 4.8 oz (72.3 kg)  01/25/22 160 lb 3.2 oz (72.7 kg)  12/14/21 168 lb 3.2 oz (76.3 kg)     GENERAL:alert, no distress and comfortable SKIN: skin color normal, no rashes or significant lesions EYES: normal, Conjunctiva are pink and non-injected, sclera clear  NEURO: alert & oriented x 3 with fluent speech  LABORATORY DATA:  I have reviewed the data as listed    Latest Ref Rng & Units 01/25/2022   10:33 AM 12/14/2021    2:13 PM 10/25/2021    1:53 PM  CBC  WBC 4.0 - 10.5 K/uL 7.2  4.5  4.8   Hemoglobin 12.0 - 15.0 g/dL 9.9  9.1  11.5   Hematocrit 36.0 - 46.0 % 29.6  26.9  32.3   Platelets 150 - 400 K/uL 296  204  172         Latest Ref Rng & Units 01/25/2022   10:33 AM 12/14/2021    2:13 PM 10/25/2021    1:53 PM  CMP  Glucose 70 - 99 mg/dL 115  117  142   BUN 8 - 23 mg/dL 18  12  16    Creatinine 0.44 - 1.00 mg/dL 0.85  0.82  0.92   Sodium 135 - 145 mmol/L 139  140  139   Potassium 3.5 - 5.1 mmol/L 4.3  4.2  3.9   Chloride 98 - 111 mmol/L 103  103  103   CO2 22 - 32 mmol/L 29  31  29    Calcium 8.9 - 10.3 mg/dL 9.9  9.4  9.4   Total Protein 6.5 - 8.1 g/dL 7.0  6.8  7.6   Total Bilirubin 0.3 - 1.2 mg/dL 0.5  0.4  0.5   Alkaline Phos 38 - 126 U/L 60  53  66   AST 15 - 41 U/L 15  15  22    ALT 0 - 44 U/L 9  11  19  RADIOGRAPHIC STUDIES: I have personally reviewed the  radiological images as listed and agreed with the findings in the report. NM PET (CU-64 DETECTNET)SKULL TO MID THIGH  Result Date: 02/14/2022 CLINICAL DATA:  Progressive GIST tumor EXAM: NUCLEAR MEDICINE PET SKULL BASE TO THIGH TECHNIQUE: 4.27 mCi copper 53 DOTATATE was injected intravenously. Full-ring PET imaging was performed from the skull base to thigh after the radiotracer. CT data was obtained and used for attenuation correction and anatomic localization. COMPARISON:  FDG PET scan 02/11/2020, MRI abdomen 01/20/2022. FINDINGS: NECK No radiotracer activity in neck lymph nodes. Incidental CT findings: None CHEST No radiotracer accumulation within mediastinal or hilar lymph nodes. No suspicious pulmonary nodules on the CT scan. Incidental CT finding:18 mm soft tissue density in the posterior LEFT breast (image 95/4). This soft tissue lesion was not hypermetabolic on comparison FDG PET scan in 2021. ABDOMEN/PELVIS Multiple photopenic lesions within the liver correspond to enhancing lesions on comparison MRI. Round mass at the gastric cardiac region measuring 5.8 x 4.6 cm also does NOT have radiotracer activity. Infiltration of tumor into the lesser omentum along the greater curvature of the stomach (image 139/series 4 ) does not have radiotracer activity. No abnormal radiotracer activity in the abdomen pelvis. Physiologic activity noted in the liver, spleen, adrenal glands and kidneys. Incidental CT findings:Small volume free fluid the pelvis. Post hysterectomy SKELETON No focal activity to suggest skeletal metastasis. Incidental CT findings:None IMPRESSION: 1. Evidence of tumor recurrence within the stomach with local extension into the lesser omentum and multiple liver metastasis. These malignant lesions are NOT avid for the somatostatin receptor analog DOTATATE which identifies well differentiated neuroendocrine tumor. 2. Of note, lesion in the gastric fundus on comparison FDG PET scan from 02/11/2020 was  hypermetabolic. Consider FDG PET scan if clinically warranted. Electronically Signed   By: Suzy Bouchard M.D.   On: 02/14/2022 15:24      Orders Placed This Encounter  Procedures   Vitamin B12    Standing Status:   Standing    Number of Occurrences:   5    Standing Expiration Date:   02/16/2023   All questions were answered. The patient knows to call the clinic with any problems, questions or concerns. No barriers to learning was detected. The total time spent in the appointment was 30 minutes.     Truitt Merle, MD 02/15/2022   I, Wilburn Mylar, am acting as scribe for Truitt Merle, MD.   I have reviewed the above documentation for accuracy and completeness, and I agree with the above.

## 2022-02-20 ENCOUNTER — Ambulatory Visit: Payer: Medicare Other | Admitting: Hematology

## 2022-02-20 ENCOUNTER — Ambulatory Visit: Payer: Medicare Other

## 2022-02-21 ENCOUNTER — Telehealth: Payer: Self-pay | Admitting: Hematology

## 2022-02-21 NOTE — Telephone Encounter (Signed)
Scheduled follow-up appointment per 9/8 los. Patient is aware.

## 2022-03-19 ENCOUNTER — Other Ambulatory Visit: Payer: Self-pay

## 2022-03-19 ENCOUNTER — Encounter: Payer: Self-pay | Admitting: Hematology

## 2022-03-19 ENCOUNTER — Inpatient Hospital Stay: Payer: Medicare Other

## 2022-03-19 ENCOUNTER — Inpatient Hospital Stay: Payer: Medicare Other | Attending: Hematology | Admitting: Hematology

## 2022-03-19 VITALS — BP 139/66 | HR 81 | Temp 98.4°F | Resp 18 | Ht 62.0 in | Wt 150.5 lb

## 2022-03-19 DIAGNOSIS — D500 Iron deficiency anemia secondary to blood loss (chronic): Secondary | ICD-10-CM

## 2022-03-19 DIAGNOSIS — E538 Deficiency of other specified B group vitamins: Secondary | ICD-10-CM | POA: Diagnosis not present

## 2022-03-19 DIAGNOSIS — C787 Secondary malignant neoplasm of liver and intrahepatic bile duct: Secondary | ICD-10-CM | POA: Diagnosis not present

## 2022-03-19 DIAGNOSIS — C49A2 Gastrointestinal stromal tumor of stomach: Secondary | ICD-10-CM | POA: Diagnosis not present

## 2022-03-19 DIAGNOSIS — Z79899 Other long term (current) drug therapy: Secondary | ICD-10-CM | POA: Diagnosis not present

## 2022-03-19 DIAGNOSIS — Z1329 Encounter for screening for other suspected endocrine disorder: Secondary | ICD-10-CM

## 2022-03-19 LAB — RETIC PANEL
Immature Retic Fract: 24.1 % — ABNORMAL HIGH (ref 2.3–15.9)
RBC.: 3.03 MIL/uL — ABNORMAL LOW (ref 3.87–5.11)
Retic Count, Absolute: 37.9 10*3/uL (ref 19.0–186.0)
Retic Ct Pct: 1.3 % (ref 0.4–3.1)
Reticulocyte Hemoglobin: 34.8 pg (ref >27.9)

## 2022-03-19 LAB — CBC WITH DIFFERENTIAL (CANCER CENTER ONLY)
Abs Immature Granulocytes: 0.02 10*3/uL (ref 0.00–0.07)
Basophils Absolute: 0 10*3/uL (ref 0.0–0.1)
Basophils Relative: 0 %
Eosinophils Absolute: 0.1 10*3/uL (ref 0.0–0.5)
Eosinophils Relative: 1 %
HCT: 30.2 % — ABNORMAL LOW (ref 36.0–46.0)
Hemoglobin: 10 g/dL — ABNORMAL LOW (ref 12.0–15.0)
Immature Granulocytes: 0 %
Lymphocytes Relative: 20 %
Lymphs Abs: 1.4 10*3/uL (ref 0.7–4.0)
MCH: 33.8 pg (ref 26.0–34.0)
MCHC: 33.1 g/dL (ref 30.0–36.0)
MCV: 102 fL — ABNORMAL HIGH (ref 80.0–100.0)
Monocytes Absolute: 0.6 10*3/uL (ref 0.1–1.0)
Monocytes Relative: 8 %
Neutro Abs: 5 10*3/uL (ref 1.7–7.7)
Neutrophils Relative %: 71 %
Platelet Count: 474 10*3/uL — ABNORMAL HIGH (ref 150–400)
RBC: 2.96 MIL/uL — ABNORMAL LOW (ref 3.87–5.11)
RDW: 14.5 % (ref 11.5–15.5)
WBC Count: 7.2 10*3/uL (ref 4.0–10.5)
nRBC: 0 % (ref 0.0–0.2)

## 2022-03-19 LAB — CMP (CANCER CENTER ONLY)
ALT: 9 U/L (ref 0–44)
AST: 19 U/L (ref 15–41)
Albumin: 3.9 g/dL (ref 3.5–5.0)
Alkaline Phosphatase: 61 U/L (ref 38–126)
Anion gap: 8 (ref 5–15)
BUN: 19 mg/dL (ref 8–23)
CO2: 30 mmol/L (ref 22–32)
Calcium: 9.2 mg/dL (ref 8.9–10.3)
Chloride: 98 mmol/L (ref 98–111)
Creatinine: 1.03 mg/dL — ABNORMAL HIGH (ref 0.44–1.00)
GFR, Estimated: 57 mL/min — ABNORMAL LOW (ref >60)
Glucose, Bld: 102 mg/dL — ABNORMAL HIGH (ref 70–99)
Potassium: 4.2 mmol/L (ref 3.5–5.1)
Sodium: 136 mmol/L (ref 135–145)
Total Bilirubin: 0.4 mg/dL (ref 0.3–1.2)
Total Protein: 7.4 g/dL (ref 6.5–8.1)

## 2022-03-19 LAB — TSH: TSH: 4.905 u[IU]/mL — ABNORMAL HIGH (ref 0.350–4.500)

## 2022-03-19 LAB — T4, FREE: Free T4: 0.98 ng/dL (ref 0.61–1.12)

## 2022-03-19 LAB — VITAMIN B12: Vitamin B-12: 288 pg/mL (ref 180–914)

## 2022-03-19 LAB — FERRITIN: Ferritin: 419 ng/mL — ABNORMAL HIGH (ref 11–307)

## 2022-03-19 MED ORDER — SUNITINIB MALATE 37.5 MG PO CAPS: ORAL_CAPSULE | ORAL | 1 refills | Status: DC

## 2022-03-19 NOTE — Progress Notes (Signed)
[place]   Telephone:([phone]) [phone] Fax:(336) [phone]   Clinic Follow up Note   Patient Care Team: [name], MD as PCP - Charissa Bash, MD as Consulting Physician (Oncology) Jonnie Finner, RN (Inactive) as Oncology Nurse Navigator Stark Klein, MD as Consulting Physician (General Surgery) Milus Banister, MD as Attending Physician (Gastroenterology)  Date of Service:  03/19/2022  CHIEF COMPLAINT: f/u of GIST  CURRENT THERAPY:  -Sunitinib, started 09/03/21, currently 37.5 mg daily -B12 injection, currently q6-8weeks  ASSESSMENT & PLAN:  Bridget Grant is a 76 y.o. female with   1. Gastric GIST, ypT3NxM0,  High grade, Kit mutation (+), local recurrence in 07/2021, liver metastasis in 01/2022 -diagnosed in 01/2020. Staging CT CAP from 01/13/20 did not show gastric mass but did show incidental findings of left breast mass and 6.9m sclerotic lesion at T7.  -she started neoadjuvant Gleevec 406mon 01/27/20, prior to surgery.  -s/p resection on 02/29/20 with Dr ByBarry DienesPath showed: high-grade GIST spanning 8 cm infiltrating through most of the distal gastric wall, with a positive distal resection margin, staged pT3. Given these features she is at very high risk for recurrence and metastasis.  -she developed local recurrence in 07/2021, she declined surgery  -she switched to sunitinib on 09/03/21; She started at 50 mg daily 4 weeks on, 2 weeks off. She developed some stomach discomfort and fatigue, and I decreased her dose to 37.24m56maily starting 10/16/21. After 5-6 weeks treatment, she developed a severe diarrhea, and stopped treatment around 12/08/21. At visit on 12/14/21, she agreed to restart Sutent 37.5 mg daily, 3 weeks on and 1 week off, but she never restarted. -repeat MRI 01/20/22 showed marked disease progression with liver metastasis. She restarted Sutent following visit on 01/25/22. -She is not tolerating Sutent well and has been taking intermittent breaks.  She  feels much better when she is off treatment.  After lengthy discussion of benefit from treatment, versus off treatment and her GIST will progresses, she agrees to continue Stutent.  Will change sutent to 37.24mg24mery other day.  -will repeat MRI next month  -We also discussed third line option of regorafenib     2. Anemia secondary to GI bleeding from #1, B12 deficiency  -She was hospitalized on 01/12/20 due to severe anemia with Hg 7.9.  -She was treated with blood transfusion and IV Feraheme in 8-02/2020 -she restarted B12 injections 03/23/21 q6weeks. Last B12 given on 02/15/22.    3.  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 -she is in touch with our sociEducation officer, museum  PLAN:  -continue Sunitinib at 37.24mg 28m change to every other day due to her poor tolerance, I refilled for her today -Follow-up in 1 months with lab and abdominal MRI with and without contrast a few days before -B12 on next visit.   No problem-specific Assessment & Plan notes found for this encounter.   SUMMARY OF ONCOLOGIC HISTORY: Oncology History Overview Note  Cancer Staging Malignant gastrointestinal stromal tumor (GIST) of stomach (HCC) Staging form: Gastrointestinal Stromal Tumor - Gastric and Omental GIST, AJCC 8th Edition - Pathologic stage from 02/29/2020: pT3, Mitotic Rate: High - Unsigned Histologic grade (G): High grade Histologic grading system: 2 grade system Location of the gastrointestinal stromal tumor: Stomach    Malignant gastrointestinal stromal tumor (GIST) of stomach (HCC) [place]5/2021 Procedure   EGD/Upper Endoscopy by Dr JacobArdis Hughs21  IMPRESSION - There was an  ulcerated mass along the greater curvature of the stomach, proximal edge was about 5-6cm from the GE junction and the mass was 7-8cm long, 2-3cm wide. It was not circumferental. The mass was friable with spontaneous oozing, contact oozing, ulcerated and heaped up. Some parts of the mass  were quite soft whereas other areas were more firm. This was very suspicious for a malignancy and it was biopsied extensively. There was mild distal gastritis, biopsied to check for H. pylori. - The examination was otherwise normal.   01/13/2020 Initial Biopsy   FINAL MICROSCOPIC DIAGNOSIS:  A. STOMACH, BIOPSY:  - High grade gastrointestinal stromal tumor, see comment.   B. STOMACH, DISTAL, BIOPSY:  - Mild reactive gastropathy with erosions.  - Warthin-Starry is negative for Helicobacter pylori.  - No intestinal metaplasia, dysplasia, or malignancy.  COMMENT:  A. The tumor has spindle and epithelioid components and appears high  grade (7 mitosis/20 hpf). Immunohistochemistry is positive for CD117 and  CD34. The cells are negative for cytokeratin 20, cytokeratin 7,  cytokeratin 5/6, CDX-2, SMA, and desmin. The findings are consistent  with a gastrointestinal stromal tumor. Dr. Vic Ripper has reviewed the  case. Dr. Ardis Hughs' nurse was notified on 01/18/2020.     01/13/2020 Imaging   CT CAP W contrast 01/13/20  IMPRESSION: 1. 2.1 cm x 1.1 cm well-defined area of low attenuation within the soft tissues of the left breast. This may represent a lymph node, however, correlation with mammography is recommended. 2. 6.5 mm sclerotic focus within the posterior aspect of the T7 vertebral body. This may represent a bone island, however, correlation with follow-up abdomen pelvis CT is recommended to exclude the presence of a metastatic lesion. 3. Small hiatal hernia. 4. 2.6 cm x 2.0 cm fat containing umbilical hernia. 5. Aortic atherosclerosis.    01/23/2020 Initial Diagnosis   Malignant gastrointestinal stromal tumor (GIST) of stomach ([place])   01/26/2020 -  Chemotherapy   Gleevec 434m once daily starting 01/26/20    02/11/2020 PET scan   IMPRESSION: 1. Focal mass along the stomach fundus with maximum SUV 15.7, compatible with malignancy. No findings of metastatic spread. 2. Mildly increased sub  solid nodularity in the left upper lobe with only very subtle associated activity, likely inflammatory. Incidental note is made of some secondary pulmonary lobular septal thickening in the lung apices, and mild interstitial edema is not excluded given the underlying cardiomegaly. 3. Other imaging findings of potential clinical significance: Aortic Atherosclerosis (ICD10-I70.0). Coronary atherosclerosis. Sigmoid colon diverticulosis. Small umbilical hernia contains adipose tissue. Physiologic activity along the costovertebral joints.   02/29/2020 Surgery   Partial gastrectomy by Dr. BBarry Dienes   02/29/2020 Pathology Results   FINAL MICROSCOPIC DIAGNOSIS:  A. STOMACH, PARTIAL GASTRECTOMY:  - High grade gastrointestinal stromal tumor with dedifferentiation,  spanning approximately 8 cm  - Tumor infiltrates through most of the distal gastric wall and involves  the distal resection margin  - See oncology table  - See comment  Immunohistochemical stains show that the tumor cells are positive for  CD117, CD34 and SMA (patchy).  A component of the tumor shows very  high-grade features with loss of CD117 and CD34 expression, consistent  with dedifferentiation.  Tumor also shows areas with  rhabdomyosarcomatous differentiation with presence of desmin expression.    04/13/2020 Procedure   EUS  by Dr JArdis Hughs IMPRESSION - The staple line from recent partial gastrectomy was noted in the proximal stomach, runs longitudinally along the gastric fundus. The mucosa immediately around the stable line  was normal appearing however in the more distal gastric body (anterior wall likely) the mucosa was quite irregular appearing; friable, firm, slightly ulcerated for 4-5cm at least. This was not a discrete tumor, and these changes were about 5cm from the pylorus. I sampled this region extensively with forceps following EUS evaluation below.- The muscularis propria was abnormally thickened starting around the  staple line and continuing throughout most of the stomach distally. This thickening was not circumferential.     FINAL MICROSCOPIC DIAGNOSIS:   A. GASTRIC BODY, BIOPSY:  - Gastric mucosa with changes consistent with reactive gastropathy.  - No spindle cell lesion, intestinal metaplasia, dysplasia or carcinoma.     10/12/2020 Procedure   EUS by Dr. Ardis Hughs  Impression: - The overall endoscopic appearance of the stomach, s/p wedge resection was [facility] IMPROVED from 04/2020 EGD/EUS. The wedge 'anastomosis' was normal appearing. The previously ulcerated mucosa was normal now. There was very mild, non-specific pangastritis. Given the significantly improved appearance, no biopsies were taken. EUS evaluation reveals expected anatomically abnormal findings at the wedge resection site (related to post operative changes) but no significanly thickened gastric layers or masses.   07/02/2021 Imaging   EXAM: CT CHEST, ABDOMEN, AND PELVIS WITH CONTRAST  IMPRESSION: 1. Stable exam. No new or progressive findings to suggest recurrent or metastatic disease. 2. Stable 15 mm hypoattenuating lesion in the lateral segment left liver, likely benign. Continued attention on follow-up recommended. 3. Small hiatal hernia. 4. Left colonic diverticulosis without diverticulitis. 5. Aortic Atherosclerosis (ICD10-I70.0).   07/26/2021 Imaging   EXAM: MRI ABDOMEN WITHOUT AND WITH CONTRAST  IMPRESSION: 1. Two enhancing gastric wall masses identified as described, concerning for tumor recurrence. A third enhancing soft tissue nodularity is visualized at the gastroesophageal junction/within the distal most esophagus which could represent tumor or postsurgical change. Consider upper endoscopy. 2. Lesion in the left hepatic lobe seen on prior CT scans is not well visualized, not definitely enhancing. 3. Other ancillary findings as described.   08/09/2021 Pathology Results   FINAL MICROSCOPIC DIAGNOSIS:   A. STOMACH,  DISTAL MASS, BIOPSY:  - Spindle cell proliferation consistent with gastrointestinal stromal  tumor (GIST).  - See comment.   COMMENT:  There are fragments of gastric mucosa and fragments composed of spindle cell proliferation with intervening collagenous stroma.  Immunohistochemistry shows strong positivity with CD117 and patchy positivity with CD34.  Cytokeratin AE1/AE3 is negative in the spindle cell proliferation.  Ki-67 shows a high proliferation index.  The findings are consistent with gastrointestinal stromal tumor (GIST).   Specimen Submitted:  A. GASTRIC, PROXIMAL SUBMUCOSAL MASS, FINE NEEDLE ASPIRATION:  FINAL MICROSCOPIC DIAGNOSIS:  - Spindle cell proliferation    08/09/2021 Procedure   Upper EUS, Dr. Ardis Hughs  Impression: - There were 2 masses in the stomach. The proximal mass was located about the 3 cm distal to the GE junction, measured 2.8 cm maximally, it was completely submucosal. I sampled this mass with EUS, FNB. The distal mass 3 cm maximally, it was located at the distal aspect of the previous wedge resection "anastomosis." I sampled this mass with mucosal biopsies as there was clearly a mucosal component.      INTERVAL HISTORY:  KYNLEIGH ARTZ is here for a follow up of GIST. She was last seen by me on 02/15/22. She presents to the clinic alone.  She is not very compliant with Sutent due to side effects, including fatigue, increased taste sensitivity, intermittent nausea.  Has lost some weight.  She feels her normal self  when she came off for a few days.  Her last dose was last week. No other new complains.    All other systems were reviewed with the patient and are negative.  MEDICAL HISTORY:  Past Medical History:  Diagnosis Date   Anxiety    Cancer ([place]) 2021   stomach   Complication of anesthesia    Depression    Hyperlipidemia    Hypertension    Migraine    Osteopenia    PONV (postoperative nausea and vomiting)    Type II or unspecified type diabetes  mellitus without mention of complication, uncontrolled 04/09/2013   Upper GI bleed 01/13/2020    SURGICAL HISTORY: Past Surgical History:  Procedure Laterality Date   ABDOMINAL HYSTERECTOMY     APPENDECTOMY     BIOPSY  01/13/2020   Procedure: BIOPSY;  Surgeon: Milus Banister, MD;  Location: [facility] ENDOSCOPY;  Service: Endoscopy;;   BIOPSY  04/13/2020   Procedure: BIOPSY;  Surgeon: Milus Banister, MD;  Location: [facility] ENDOSCOPY;  Service: Endoscopy;;   BIOPSY  08/09/2021   Procedure: BIOPSY;  Surgeon: Milus Banister, MD;  Location: [facility] ENDOSCOPY;  Service: Gastroenterology;;   CHOLECYSTECTOMY     ESOPHAGOGASTRODUODENOSCOPY N/A 08/09/2021   Procedure: ESOPHAGOGASTRODUODENOSCOPY (EGD);  Surgeon: Milus Banister, MD;  Location: [facility] ENDOSCOPY;  Service: Gastroenterology;  Laterality: N/A;   ESOPHAGOGASTRODUODENOSCOPY (EGD) WITH PROPOFOL N/A 01/13/2020   Procedure: ESOPHAGOGASTRODUODENOSCOPY (EGD) WITH PROPOFOL;  Surgeon: Milus Banister, MD;  Location: [facility] ENDOSCOPY;  Service: Endoscopy;  Laterality: N/A;   ESOPHAGOGASTRODUODENOSCOPY (EGD) WITH PROPOFOL N/A 04/13/2020   Procedure: ESOPHAGOGASTRODUODENOSCOPY (EGD) WITH PROPOFOL;  Surgeon: Milus Banister, MD;  Location: [facility] ENDOSCOPY;  Service: Endoscopy;  Laterality: N/A;   ESOPHAGOGASTRODUODENOSCOPY (EGD) WITH PROPOFOL N/A 10/12/2020   Procedure: ESOPHAGOGASTRODUODENOSCOPY (EGD) WITH PROPOFOL;  Surgeon: Milus Banister, MD;  Location: [facility] ENDOSCOPY;  Service: Endoscopy;  Laterality: N/A;   EUS N/A 04/13/2020   Procedure: UPPER ENDOSCOPIC ULTRASOUND (EUS) LINEAR;  Surgeon: Milus Banister, MD;  Location: [facility] ENDOSCOPY;  Service: Endoscopy;  Laterality: N/A;   EUS N/A 10/12/2020   Procedure: UPPER ENDOSCOPIC ULTRASOUND (EUS) RADIAL;  Surgeon: Milus Banister, MD;  Location: [facility] ENDOSCOPY;  Service: Endoscopy;  Laterality: N/A;   EUS N/A 08/09/2021   Procedure: UPPER ENDOSCOPIC ULTRASOUND (EUS) RADIAL;  Surgeon: Milus Banister, MD;  Location: [facility] ENDOSCOPY;   Service: Gastroenterology;  Laterality: N/A;   FINE NEEDLE ASPIRATION N/A 08/09/2021   Procedure: FINE NEEDLE ASPIRATION (FNA) LINEAR;  Surgeon: Milus Banister, MD;  Location: [facility] ENDOSCOPY;  Service: Gastroenterology;  Laterality: N/A;   PARTIAL GASTRECTOMY  02/28/2020    I have reviewed the social history and family history with the patient and they are unchanged from previous note.  ALLERGIES:  has No Known Allergies.  MEDICATIONS:  Current Outpatient Medications  Medication Sig Dispense Refill   acetaminophen (TYLENOL) 500 MG tablet Take 1,000 mg by mouth every 6 (six) hours as needed for moderate pain.     atorvastatin (LIPITOR) 20 MG tablet TAKE 1 TABLET BY MOUTH EVERY DAY 90 tablet 3   diltiazem (CARDIZEM CD) 240 MG 24 hr capsule TAKE 1 CAPSULE(240 MG) BY MOUTH DAILY 90 capsule 3   Homeopathic Products (THERAWORX RELIEF EX) Apply 1 application topically daily as needed (cramping).     hydrochlorothiazide (HYDRODIURIL) 25 MG tablet Take 1 tablet (25 mg total) by mouth daily. 90 tablet 3   lisinopril (ZESTRIL) 20 MG tablet TAKE 1 TABLET BY MOUTH EVERY DAY 90  tablet 0   metFORMIN (GLUCOPHAGE-XR) 500 MG 24 hr tablet Take 2 tablets (1,000 mg total) by mouth daily with breakfast. 180 tablet 3   ondansetron (ZOFRAN ODT) 8 MG disintegrating tablet Take 1 tablet (8 mg total) by mouth every 8 (eight) hours as needed for nausea or vomiting. 30 tablet 1   OVER THE COUNTER MEDICATION Take 1 capsule by mouth daily. Super Beets otc supplement     pantoprazole (PROTONIX) 40 MG tablet Take 1 tablet (40 mg total) by mouth 2 (two) times daily before a meal. TAKE 1 TABLET(40 MG) BY MOUTH TWICE DAILY BEFORE A MEAL 180 tablet 0   Prenatal Vit-Fe Fumarate-FA (PRENATAL PO) Take 3 tablets by mouth daily.     SUNItinib (SUTENT) 37.5 MG capsule TAKE 1 CAPSULE BY MOUTH EVERY OTHER DAY 20 capsule 1   triamcinolone cream (KENALOG) 0.1 % Apply 1 application. topically 2 (two) times daily. 30 g 1   trolamine  salicylate (ASPERCREME) 10 % cream Apply 1 application topically as needed for muscle pain.     No current facility-administered medications for this visit.    PHYSICAL EXAMINATION: ECOG PERFORMANCE STATUS: 1 - Symptomatic but completely ambulatory  Vitals:   03/19/22 1446  BP: 139/66  Pulse: 81  Resp: 18  Temp: 98.4 F (36.9 C)  SpO2: 98%   Wt Readings from Last 3 Encounters:  03/19/22 150 lb 8 oz (68.3 kg)  02/15/22 159 lb 4.8 oz (72.3 kg)  01/25/22 160 lb 3.2 oz (72.7 kg)     GENERAL:alert, no distress and comfortable SKIN: skin color, texture, turgor are normal, no rashes or significant lesions EYES: normal, Conjunctiva are pink and non-injected, sclera clear NECK: supple, thyroid normal size, non-tender, without nodularity LYMPH:  no palpable lymphadenopathy in the cervical, axillary  LUNGS: clear to auscultation and percussion with normal breathing effort HEART: regular rate & rhythm and no murmurs and no lower extremity edema ABDOMEN:abdomen soft, non-tender and normal bowel sounds Musculoskeletal:no cyanosis of digits and no clubbing  NEURO: alert & oriented x 3 with fluent speech, no focal motor/sensory deficits  LABORATORY DATA:  I have reviewed the data as listed    Latest Ref Rng & Units 03/19/2022    2:08 PM 01/25/2022   10:33 AM 12/14/2021    2:13 PM  CBC  WBC 4.0 - 10.5 K/uL 7.2  7.2  4.5   Hemoglobin 12.0 - 15.0 g/dL 10.0  9.9  9.1   Hematocrit 36.0 - 46.0 % 30.2  29.6  26.9   Platelets 150 - 400 K/uL 474  296  204         Latest Ref Rng & Units 03/19/2022    2:08 PM 01/25/2022   10:33 AM 12/14/2021    2:13 PM  CMP  Glucose 70 - 99 mg/dL 102  115  117   BUN 8 - 23 mg/dL 19  18  12    Creatinine 0.44 - 1.00 mg/dL 1.03  0.85  0.82   Sodium 135 - 145 mmol/L 136  139  140   Potassium 3.5 - 5.1 mmol/L 4.2  4.3  4.2   Chloride 98 - 111 mmol/L 98  103  103   CO2 22 - 32 mmol/L 30  29  31    Calcium 8.9 - 10.3 mg/dL 9.2  9.9  9.4   Total Protein 6.5 -  8.1 g/dL 7.4  7.0  6.8   Total Bilirubin 0.3 - 1.2 mg/dL 0.4  0.5  0.4   Alkaline  Phos 38 - 126 U/L 61  60  53   AST 15 - 41 U/L 19  15  15    ALT 0 - 44 U/L 9  9  11        RADIOGRAPHIC STUDIES: I have personally reviewed the radiological images as listed and agreed with the findings in the report. No results found.    Orders Placed This Encounter  Procedures   MR Abdomen W Wo Contrast    Standing Status:   Future    Standing Expiration Date:   03/20/2023    Order Specific Question:   If indicated for the ordered procedure, I authorize the administration of contrast media per Radiology protocol    Answer:   Yes    Order Specific Question:   What is the patient's sedation requirement?    Answer:   No Sedation    Order Specific Question:   Does the patient have a pacemaker or implanted devices?    Answer:   No    Order Specific Question:   Preferred imaging location?    Answer:   [facility] (table limit - 550 lbs)   All questions were answered. The patient knows to call the clinic with any problems, questions or concerns. No barriers to learning was detected. The total time spent in the appointment was 30 minutes.     Truitt Merle, MD 03/19/2022   I, Wilburn Mylar, am acting as scribe for Truitt Merle, MD.   I have reviewed the above documentation for accuracy and completeness, and I agree with the above.

## 2022-03-22 ENCOUNTER — Other Ambulatory Visit: Payer: Self-pay

## 2022-03-22 MED ORDER — SUNITINIB MALATE 37.5 MG PO CAPS: ORAL_CAPSULE | ORAL | 1 refills | Status: DC

## 2022-04-01 ENCOUNTER — Emergency Department (HOSPITAL_COMMUNITY): Payer: Medicare Other

## 2022-04-01 ENCOUNTER — Other Ambulatory Visit: Payer: Self-pay

## 2022-04-01 ENCOUNTER — Encounter (HOSPITAL_COMMUNITY): Payer: Self-pay | Admitting: Internal Medicine

## 2022-04-01 ENCOUNTER — Inpatient Hospital Stay (HOSPITAL_COMMUNITY)
Admission: EM | Admit: 2022-04-01 | Discharge: 2022-04-04 | DRG: 375 | Disposition: A | Discharge status: Hospice | Payer: Medicare Other | Attending: Internal Medicine | Admitting: Internal Medicine

## 2022-04-01 DIAGNOSIS — Z8041 Family history of malignant neoplasm of ovary: Secondary | ICD-10-CM

## 2022-04-01 DIAGNOSIS — Z7984 Long term (current) use of oral hypoglycemic drugs: Secondary | ICD-10-CM

## 2022-04-01 DIAGNOSIS — Z7189 Other specified counseling: Secondary | ICD-10-CM | POA: Diagnosis not present

## 2022-04-01 DIAGNOSIS — Z66 Do not resuscitate: Secondary | ICD-10-CM | POA: Diagnosis present

## 2022-04-01 DIAGNOSIS — K3189 Other diseases of stomach and duodenum: Secondary | ICD-10-CM | POA: Diagnosis not present

## 2022-04-01 DIAGNOSIS — Z515 Encounter for palliative care: Secondary | ICD-10-CM

## 2022-04-01 DIAGNOSIS — R52 Pain, unspecified: Secondary | ICD-10-CM | POA: Diagnosis not present

## 2022-04-01 DIAGNOSIS — D500 Iron deficiency anemia secondary to blood loss (chronic): Secondary | ICD-10-CM | POA: Diagnosis present

## 2022-04-01 DIAGNOSIS — R1084 Generalized abdominal pain: Secondary | ICD-10-CM | POA: Diagnosis not present

## 2022-04-01 DIAGNOSIS — R140 Abdominal distension (gaseous): Secondary | ICD-10-CM | POA: Diagnosis not present

## 2022-04-01 DIAGNOSIS — E1165 Type 2 diabetes mellitus with hyperglycemia: Secondary | ICD-10-CM | POA: Diagnosis present

## 2022-04-01 DIAGNOSIS — K255 Chronic or unspecified gastric ulcer with perforation: Secondary | ICD-10-CM | POA: Diagnosis not present

## 2022-04-01 DIAGNOSIS — M8580 Other specified disorders of bone density and structure, unspecified site: Secondary | ICD-10-CM | POA: Diagnosis not present

## 2022-04-01 DIAGNOSIS — C799 Secondary malignant neoplasm of unspecified site: Secondary | ICD-10-CM | POA: Diagnosis not present

## 2022-04-01 DIAGNOSIS — E872 Acidosis, unspecified: Secondary | ICD-10-CM | POA: Diagnosis not present

## 2022-04-01 DIAGNOSIS — D638 Anemia in other chronic diseases classified elsewhere: Secondary | ICD-10-CM | POA: Diagnosis present

## 2022-04-01 DIAGNOSIS — R112 Nausea with vomiting, unspecified: Secondary | ICD-10-CM | POA: Diagnosis not present

## 2022-04-01 DIAGNOSIS — R404 Transient alteration of awareness: Secondary | ICD-10-CM | POA: Diagnosis not present

## 2022-04-01 DIAGNOSIS — R109 Unspecified abdominal pain: Secondary | ICD-10-CM | POA: Diagnosis not present

## 2022-04-01 DIAGNOSIS — C786 Secondary malignant neoplasm of retroperitoneum and peritoneum: Secondary | ICD-10-CM | POA: Diagnosis present

## 2022-04-01 DIAGNOSIS — K429 Umbilical hernia without obstruction or gangrene: Secondary | ICD-10-CM | POA: Diagnosis present

## 2022-04-01 DIAGNOSIS — F32A Depression, unspecified: Secondary | ICD-10-CM | POA: Diagnosis not present

## 2022-04-01 DIAGNOSIS — F419 Anxiety disorder, unspecified: Secondary | ICD-10-CM | POA: Diagnosis present

## 2022-04-01 DIAGNOSIS — I10 Essential (primary) hypertension: Secondary | ICD-10-CM | POA: Diagnosis present

## 2022-04-01 DIAGNOSIS — R180 Malignant ascites: Secondary | ICD-10-CM | POA: Diagnosis present

## 2022-04-01 DIAGNOSIS — R188 Other ascites: Secondary | ICD-10-CM | POA: Diagnosis not present

## 2022-04-01 DIAGNOSIS — N179 Acute kidney failure, unspecified: Secondary | ICD-10-CM | POA: Diagnosis present

## 2022-04-01 DIAGNOSIS — C49A0 Gastrointestinal stromal tumor, unspecified site: Secondary | ICD-10-CM

## 2022-04-01 DIAGNOSIS — K631 Perforation of intestine (nontraumatic): Secondary | ICD-10-CM | POA: Diagnosis not present

## 2022-04-01 DIAGNOSIS — C49A2 Gastrointestinal stromal tumor of stomach: Principal | ICD-10-CM | POA: Diagnosis present

## 2022-04-01 DIAGNOSIS — Z8249 Family history of ischemic heart disease and other diseases of the circulatory system: Secondary | ICD-10-CM | POA: Diagnosis not present

## 2022-04-01 DIAGNOSIS — R4589 Other symptoms and signs involving emotional state: Secondary | ICD-10-CM | POA: Diagnosis not present

## 2022-04-01 DIAGNOSIS — E860 Dehydration: Secondary | ICD-10-CM | POA: Diagnosis present

## 2022-04-01 DIAGNOSIS — Z7401 Bed confinement status: Secondary | ICD-10-CM | POA: Diagnosis not present

## 2022-04-01 DIAGNOSIS — C787 Secondary malignant neoplasm of liver and intrahepatic bile duct: Secondary | ICD-10-CM | POA: Diagnosis present

## 2022-04-01 DIAGNOSIS — E43 Unspecified severe protein-calorie malnutrition: Secondary | ICD-10-CM | POA: Diagnosis not present

## 2022-04-01 DIAGNOSIS — E119 Type 2 diabetes mellitus without complications: Secondary | ICD-10-CM | POA: Diagnosis not present

## 2022-04-01 DIAGNOSIS — Z803 Family history of malignant neoplasm of breast: Secondary | ICD-10-CM

## 2022-04-01 DIAGNOSIS — E785 Hyperlipidemia, unspecified: Secondary | ICD-10-CM | POA: Diagnosis present

## 2022-04-01 DIAGNOSIS — I959 Hypotension, unspecified: Secondary | ICD-10-CM | POA: Diagnosis not present

## 2022-04-01 DIAGNOSIS — R000 Tachycardia, unspecified: Secondary | ICD-10-CM | POA: Diagnosis not present

## 2022-04-01 DIAGNOSIS — Z79899 Other long term (current) drug therapy: Secondary | ICD-10-CM

## 2022-04-01 LAB — CBC WITH DIFFERENTIAL/PLATELET
Abs Immature Granulocytes: 0.1 10*3/uL — ABNORMAL HIGH (ref 0.00–0.07)
Basophils Absolute: 0.1 10*3/uL (ref 0.0–0.1)
Basophils Relative: 1 %
Eosinophils Absolute: 0 10*3/uL (ref 0.0–0.5)
Eosinophils Relative: 0 %
HCT: 33.2 % — ABNORMAL LOW (ref 36.0–46.0)
Hemoglobin: 10.5 g/dL — ABNORMAL LOW (ref 12.0–15.0)
Immature Granulocytes: 2 %
Lymphocytes Relative: 8 %
Lymphs Abs: 0.5 10*3/uL — ABNORMAL LOW (ref 0.7–4.0)
MCH: 32.6 pg (ref 26.0–34.0)
MCHC: 31.6 g/dL (ref 30.0–36.0)
MCV: 103.1 fL — ABNORMAL HIGH (ref 80.0–100.0)
Monocytes Absolute: 0.3 10*3/uL (ref 0.1–1.0)
Monocytes Relative: 4 %
Neutro Abs: 5 10*3/uL (ref 1.7–7.7)
Neutrophils Relative %: 85 %
Platelets: 406 10*3/uL — ABNORMAL HIGH (ref 150–400)
RBC: 3.22 MIL/uL — ABNORMAL LOW (ref 3.87–5.11)
RDW: 15.6 % — ABNORMAL HIGH (ref 11.5–15.5)
WBC: 5.9 10*3/uL (ref 4.0–10.5)
nRBC: 0.3 % — ABNORMAL HIGH (ref 0.0–0.2)

## 2022-04-01 LAB — COMPREHENSIVE METABOLIC PANEL
ALT: 18 U/L (ref 0–44)
AST: 37 U/L (ref 15–41)
Albumin: 3 g/dL — ABNORMAL LOW (ref 3.5–5.0)
Alkaline Phosphatase: 48 U/L (ref 38–126)
Anion gap: 15 (ref 5–15)
BUN: 41 mg/dL — ABNORMAL HIGH (ref 8–23)
CO2: 22 mmol/L (ref 22–32)
Calcium: 8.9 mg/dL (ref 8.9–10.3)
Chloride: 99 mmol/L (ref 98–111)
Creatinine, Ser: 2.92 mg/dL — ABNORMAL HIGH (ref 0.44–1.00)
GFR, Estimated: 16 mL/min — ABNORMAL LOW (ref >60)
Glucose, Bld: 136 mg/dL — ABNORMAL HIGH (ref 70–99)
Potassium: 4.5 mmol/L (ref 3.5–5.1)
Sodium: 136 mmol/L (ref 135–145)
Total Bilirubin: 0.8 mg/dL (ref 0.3–1.2)
Total Protein: 7.2 g/dL (ref 6.5–8.1)

## 2022-04-01 LAB — LACTIC ACID, PLASMA
Lactic Acid, Venous: 3.5 mmol/L (ref 0.5–1.9)
Lactic Acid, Venous: 2.9 mmol/L (ref 0.5–1.9)

## 2022-04-01 LAB — LIPASE, BLOOD: Lipase: 17 U/L (ref 11–51)

## 2022-04-01 MED ORDER — LORAZEPAM 2 MG/ML IJ SOLN: 0.5000 mg | Freq: Four times a day (QID) | INTRAMUSCULAR | Status: DC | PRN

## 2022-04-01 MED ORDER — CHLORHEXIDINE GLUCONATE CLOTH 2 % EX PADS
6.0000 | MEDICATED_PAD | Freq: Every day | CUTANEOUS | Status: DC
  Administered 2022-04-02 – 2022-04-04 (×3): 6 via TOPICAL

## 2022-04-01 MED ORDER — SODIUM CHLORIDE 0.9 % IV BOLUS
1000.0000 mL | Freq: Once | INTRAVENOUS | Status: AC
  Administered 2022-04-01: 1000 mL via INTRAVENOUS

## 2022-04-01 MED ORDER — HYDROMORPHONE HCL 1 MG/ML IJ SOLN
0.5000 mg | INTRAMUSCULAR | Status: DC | PRN
  Administered 2022-04-01 – 2022-04-02 (×3): 1 mg via INTRAVENOUS
  Filled 2022-04-01 (×3): qty 1

## 2022-04-01 MED ORDER — ONDANSETRON HCL 4 MG/2ML IJ SOLN
4.0000 mg | Freq: Once | INTRAMUSCULAR | Status: AC
  Administered 2022-04-01: 4 mg via INTRAVENOUS
  Filled 2022-04-01: qty 2

## 2022-04-01 MED ORDER — LACTATED RINGERS IV BOLUS
1000.0000 mL | Freq: Once | INTRAVENOUS | Status: AC
  Administered 2022-04-01: 1000 mL via INTRAVENOUS

## 2022-04-01 MED ORDER — PIPERACILLIN-TAZOBACTAM 3.375 G IVPB 30 MIN
3.3750 g | Freq: Once | INTRAVENOUS | Status: AC
  Administered 2022-04-01: 3.375 g via INTRAVENOUS
  Filled 2022-04-01: qty 50

## 2022-04-01 MED ORDER — HYDROMORPHONE HCL 1 MG/ML IJ SOLN
0.5000 mg | Freq: Once | INTRAMUSCULAR | Status: AC
  Administered 2022-04-01: 0.5 mg via INTRAVENOUS
  Filled 2022-04-01: qty 1

## 2022-04-01 MED ORDER — MORPHINE SULFATE (PF) 4 MG/ML IV SOLN
4.0000 mg | Freq: Once | INTRAVENOUS | Status: AC
  Administered 2022-04-01: 4 mg via INTRAVENOUS
  Filled 2022-04-01: qty 1

## 2022-04-01 MED ORDER — FLUCONAZOLE IN SODIUM CHLORIDE 200-0.9 MG/100ML-% IV SOLN: 200.0000 mg | INTRAVENOUS | Status: DC

## 2022-04-01 MED ORDER — SODIUM CHLORIDE 0.9 % IV BOLUS
500.0000 mL | Freq: Once | INTRAVENOUS | Status: AC
  Administered 2022-04-01: 500 mL via INTRAVENOUS

## 2022-04-01 MED ORDER — ORAL CARE MOUTH RINSE
15.0000 mL | OROMUCOSAL | Status: DC
  Administered 2022-04-02 – 2022-04-04 (×8): 15 mL via OROMUCOSAL

## 2022-04-01 MED ORDER — DEXTROSE-NACL 5-0.9 % IV SOLN: INTRAVENOUS | Status: DC

## 2022-04-01 MED ORDER — FLUCONAZOLE IN SODIUM CHLORIDE 400-0.9 MG/200ML-% IV SOLN
400.0000 mg | Freq: Once | INTRAVENOUS | Status: AC
  Administered 2022-04-01: 400 mg via INTRAVENOUS
  Filled 2022-04-01: qty 200

## 2022-04-01 MED ORDER — PROMETHAZINE HCL 25 MG PO TABS
12.5000 mg | ORAL_TABLET | Freq: Four times a day (QID) | ORAL | Status: DC | PRN
  Administered 2022-04-01: 25 mg via ORAL
  Filled 2022-04-01: qty 1

## 2022-04-01 MED ORDER — PROMETHAZINE HCL 25 MG RE SUPP: 25.0000 mg | Freq: Four times a day (QID) | RECTAL | Status: DC | PRN

## 2022-04-01 MED ORDER — ORAL CARE MOUTH RINSE: 15.0000 mL | OROMUCOSAL | Status: DC | PRN

## 2022-04-01 MED ORDER — SODIUM CHLORIDE 0.9 % IV SOLN
6.2500 mg | Freq: Four times a day (QID) | INTRAVENOUS | Status: DC | PRN
  Filled 2022-04-01: qty 0.25

## 2022-04-01 NOTE — ED Provider Triage Note (Signed)
Emergency Medicine Provider Triage Evaluation Note  QUINTASHA GREN , a 76 y.o. female  was evaluated in triage.  Pt complains of abdominal pain.  Started today while sitting watching TV.  Patient states that the pain is all over.  States she has also had persistent nausea vomiting and diarrhea.  Denies hematemesis and bloody stools.  States that she does have stomach cancer and is actively receiving chemotherapy.  Endorses a fever at home.  Review of Systems  Positive: See above Negative: See above  Physical Exam  BP 104/71 (BP Location: Left Arm)   Pulse (!) 128   Temp 99.1 F (37.3 C) (Oral)   Resp 16   SpO2 99%  Gen:   Awake, no distress   Resp:  Normal effort  MSK:   Moves extremities without difficulty  Other:    Medical Decision Making  Medically screening exam initiated at 3:32 PM.  Appropriate orders placed.  KONI KANNAN was informed that the remainder of the evaluation will be completed by another provider, this initial triage assessment does not replace that evaluation, and the importance of remaining in the ED until their evaluation is complete.  Work up initiated   Harriet Pho, PA-C 04/01/22 1537

## 2022-04-01 NOTE — Progress Notes (Signed)
Pharmacy Antibiotic Note  Bridget Grant is a 76 y.o. female admitted on 04/01/2022 with intra-abdominal infection.  Pharmacy has been consulted for fluconazole dosing for suspected GI perforation per [facility].  She has AKI with admit SCr elevated at 2.9 and CrCl ~ 15 ml/min.   Plan: Fluconazole renally dosed: '400mg'$  IV loading dose followed by '200mg'$  IV q24h  Follow up renal function, culture results, and clinical course.      Temp (24hrs), Avg:99.1 F (37.3 C), Min:99.1 F (37.3 C), Max:99.1 F (37.3 C)  Recent Labs  Lab 04/01/22 1508 04/01/22 1510 04/01/22 1748  WBC  --  5.9  --   CREATININE  --  2.92*  --   LATICACIDVEN 3.5*  --  2.9*    Estimated Creatinine Clearance: 15.3 mL/min (A) (by C-G formula based on SCr of 2.92 mg/dL (H)).    No Known Allergies  Antimicrobials this admission: 10/23 Zosyn x1 10/23 Fluconazole   Dose adjustments this admission:   Microbiology results: 10/23 BCx:   Thank you for allowing pharmacy to be a part of this patient's care.  Gretta Arab PharmD, BCPS [facility] main pharmacy [phone] 04/01/2022 7:00 PM

## 2022-04-01 NOTE — Consult Note (Signed)
Reason for Consult:gastric perforation Referring Physician: Regenia Skeeter, MD  Bridget Grant is an 76 y.o. female.  HPI:  Pt is a 76 yo F who presents with 2 days of severe abdominal pain.  The patient has a history of metastatic gastric GIST.  The patient is known to me as I did a robotic partial gastrectomy on the patient in September 2021.  She had an unusual thickened stomach diffusely.  This ended up being a positive distal margin.  Because the way this looked on endoscopic ultrasound and visually, it felt like she would need a total gastrectomy to get a negative margin.  We discussed this in clinic and she did not want to do this.  She took adjuvant Gleevec.  She developed what appeared to be gastric recurrence that was seen on scan in February 2023.  We discussed the potential for surgery at that time but she declined.  There had been a potential liver lesion at that point but did not show up on MRI.  She was started on sunitinib malate in March.  Her dose was reduced in May.  She had a break and then resumed at 37.5.  Unfortunately, repeat MRI in August showed marked disease progression with liver metastases.  She and Dr. Burr Medico were discussing third line treatment.  Unfortunately, she came to the emergency department today, 04/01/2022 with severe abdominal pain and bloating.  She has lost weight and has not been able to eat.  She feels like her taste buds have been suppressed as well.  She has also been anemic.  She is accompanied by her sister-in-law, Bridget Grant, who works at [employer].    Past Medical History:  Diagnosis Date   Anxiety    Cancer ([place]) 2021   stomach   Complication of anesthesia    Depression    Hyperlipidemia    Hypertension    Migraine    Osteopenia    PONV (postoperative nausea and vomiting)    Type II or unspecified type diabetes mellitus without mention of complication, uncontrolled 04/09/2013   Upper GI bleed 01/13/2020    Past Surgical History:  Procedure  Laterality Date   ABDOMINAL HYSTERECTOMY     APPENDECTOMY     BIOPSY  01/13/2020   Procedure: BIOPSY;  Surgeon: Milus Banister, MD;  Location: [facility] ENDOSCOPY;  Service: Endoscopy;;   BIOPSY  04/13/2020   Procedure: BIOPSY;  Surgeon: Milus Banister, MD;  Location: [facility] ENDOSCOPY;  Service: Endoscopy;;   BIOPSY  08/09/2021   Procedure: BIOPSY;  Surgeon: Milus Banister, MD;  Location: [facility] ENDOSCOPY;  Service: Gastroenterology;;   CHOLECYSTECTOMY     ESOPHAGOGASTRODUODENOSCOPY N/A 08/09/2021   Procedure: ESOPHAGOGASTRODUODENOSCOPY (EGD);  Surgeon: Milus Banister, MD;  Location: [facility] ENDOSCOPY;  Service: Gastroenterology;  Laterality: N/A;   ESOPHAGOGASTRODUODENOSCOPY (EGD) WITH PROPOFOL N/A 01/13/2020   Procedure: ESOPHAGOGASTRODUODENOSCOPY (EGD) WITH PROPOFOL;  Surgeon: Milus Banister, MD;  Location: [facility] ENDOSCOPY;  Service: Endoscopy;  Laterality: N/A;   ESOPHAGOGASTRODUODENOSCOPY (EGD) WITH PROPOFOL N/A 04/13/2020   Procedure: ESOPHAGOGASTRODUODENOSCOPY (EGD) WITH PROPOFOL;  Surgeon: Milus Banister, MD;  Location: [facility] ENDOSCOPY;  Service: Endoscopy;  Laterality: N/A;   ESOPHAGOGASTRODUODENOSCOPY (EGD) WITH PROPOFOL N/A 10/12/2020   Procedure: ESOPHAGOGASTRODUODENOSCOPY (EGD) WITH PROPOFOL;  Surgeon: Milus Banister, MD;  Location: [facility] ENDOSCOPY;  Service: Endoscopy;  Laterality: N/A;   EUS N/A 04/13/2020   Procedure: UPPER ENDOSCOPIC ULTRASOUND (EUS) LINEAR;  Surgeon: Milus Banister, MD;  Location: [facility] ENDOSCOPY;  Service: Endoscopy;  Laterality: N/A;  EUS N/A 10/12/2020   Procedure: UPPER ENDOSCOPIC ULTRASOUND (EUS) RADIAL;  Surgeon: Milus Banister, MD;  Location: [facility] ENDOSCOPY;  Service: Endoscopy;  Laterality: N/A;   EUS N/A 08/09/2021   Procedure: UPPER ENDOSCOPIC ULTRASOUND (EUS) RADIAL;  Surgeon: Milus Banister, MD;  Location: [facility] ENDOSCOPY;  Service: Gastroenterology;  Laterality: N/A;   FINE NEEDLE ASPIRATION N/A 08/09/2021   Procedure: FINE NEEDLE ASPIRATION (FNA) LINEAR;  Surgeon: Milus Banister, MD;  Location: [facility] ENDOSCOPY;  Service: Gastroenterology;  Laterality: N/A;   PARTIAL GASTRECTOMY  02/28/2020    Family History  Problem Relation Age of Onset   Cancer Mother 67       breast cancer and ovarian cancer    Breast cancer Mother 33   Hypertension Father    Cancer Maternal Grandmother        unknown type of cancer     Social History:  reports that she has never smoked. She has never used smokeless tobacco. She reports that she does not drink alcohol and does not use drugs.  Allergies: No Known Allergies  Medications:  acetaminophen (TYLENOL) 500 MG tablet atorvastatin (LIPITOR) 20 MG tablet diltiazem (CARDIZEM CD) 240 MG 24 hr capsule Homeopathic Products (THERAWORX RELIEF EX) hydrochlorothiazide (HYDRODIURIL) 25 MG tablet lisinopril (ZESTRIL) 20 MG tablet metFORMIN (GLUCOPHAGE-XR) 500 MG 24 hr tablet ondansetron (ZOFRAN ODT) 8 MG disintegrating tablet OVER THE COUNTER MEDICATION pantoprazole (PROTONIX) 40 MG tablet Prenatal Vit-Fe Fumarate-FA (PRENATAL PO) SUNItinib (SUTENT) 37.5 MG capsule triamcinolone cream (KENALOG) 0.1 % trolamine salicylate (ASPERCREME) 10 % cream   Results for orders placed or performed during the hospital encounter of 04/01/22 (from the past 48 hour(s))  Lactic acid, plasma     Status: Abnormal   Collection Time: 04/01/22  3:08 PM  Result Value Ref Range   Lactic Acid, Venous 3.5 (HH) 0.5 - 1.9 mmol/L    Comment: CRITICAL RESULT CALLED TO, READ BACK BY AND VERIFIED WITH DOWD P. RN '@1701'$  ON 10/23 BY KERLANDIA C. Performed at  [facility], [place] [address], [place], [place] [address]   CBC with Differential     Status: Abnormal   Collection Time: 04/01/22  3:10 PM  Result Value Ref Range   WBC 5.9 4.0 - 10.5 K/uL   RBC 3.22 (L) 3.87 - 5.11 MIL/uL   Hemoglobin 10.5 (L) 12.0 - 15.0 g/dL   HCT 33.2 (L) 36.0 - 46.0 %   MCV 103.1 (H) 80.0 - 100.0 fL   MCH 32.6 26.0 - 34.0 pg   MCHC 31.6 30.0 - 36.0 g/dL    RDW 15.6 (H) 11.5 - 15.5 %   Platelets 406 (H) 150 - 400 K/uL   nRBC 0.3 (H) 0.0 - 0.2 %   Neutrophils Relative % 85 %   Neutro Abs 5.0 1.7 - 7.7 K/uL   Lymphocytes Relative 8 %   Lymphs Abs 0.5 (L) 0.7 - 4.0 K/uL   Monocytes Relative 4 %   Monocytes Absolute 0.3 0.1 - 1.0 K/uL   Eosinophils Relative 0 %   Eosinophils Absolute 0.0 0.0 - 0.5 K/uL   Basophils Relative 1 %   Basophils Absolute 0.1 0.0 - 0.1 K/uL   WBC Morphology DOHLE BODIES     Comment: INCREASED BANDS (>20% BANDS) MILD LEFT SHIFT (1-5% METAS, OCC MYELO, OCC BANDS) VACUOLATED NEUTROPHILS    Immature Granulocytes 2 %   Abs Immature Granulocytes 0.10 (H) 0.00 - 0.07 K/uL   Polychromasia PRESENT     Comment: Performed at [facility]  [place] [address], [place], [place] [address]  Comprehensive metabolic panel     Status: Abnormal   Collection Time: 04/01/22  3:10 PM  Result Value Ref Range   Sodium 136 135 - 145 mmol/L   Potassium 4.5 3.5 - 5.1 mmol/L   Chloride 99 98 - 111 mmol/L   CO2 22 22 - 32 mmol/L   Glucose, Bld 136 (H) 70 - 99 mg/dL    Comment: Glucose reference range applies only to samples taken after fasting for at least 8 hours.   BUN 41 (H) 8 - 23 mg/dL   Creatinine, Ser 2.92 (H) 0.44 - 1.00 mg/dL   Calcium 8.9 8.9 - 10.3 mg/dL   Total Protein 7.2 6.5 - 8.1 g/dL   Albumin 3.0 (L) 3.5 - 5.0 g/dL   AST 37 15 - 41 U/L   ALT 18 0 - 44 U/L   Alkaline Phosphatase 48 38 - 126 U/L   Total Bilirubin 0.8 0.3 - 1.2 mg/dL   GFR, Estimated 16 (L) >60 mL/min    Comment: (NOTE) Calculated using the CKD-EPI Creatinine Equation (2021)    Anion gap 15 5 - 15    Comment: Performed at [facility], [place] [address], [place], [place] [address]  Lipase, blood     Status: None   Collection Time: 04/01/22  3:10 PM  Result Value Ref Range   Lipase 17 11 - 51 U/L    Comment: Performed at [facility], [place] [address] [place], [place] [address]  Lactic acid, plasma      Status: Abnormal   Collection Time: 04/01/22  5:48 PM  Result Value Ref Range   Lactic Acid, Venous 2.9 (HH) 0.5 - 1.9 mmol/L    Comment: CRITICAL RESULT CALLED TO, READ BACK BY AND VERIFIED WITH EAPON M. RN '@1823'$  ON 10/23 BY KERLANDIA C. Performed at [facility], [place] [address], [place], [place] [address]     CT Abdomen Pelvis Wo Contrast  Addendum Date: 04/01/2022   ADDENDUM REPORT: 04/01/2022 18:06 ADDENDUM: The original report was by Dr. Van Clines. The following addendum is by Dr. Van Clines: Critical Value/emergent results were called by telephone at the time of interpretation on 04/01/2022 at 6:02 pm to provider Joanette Gula , who verbally acknowledged these results. Electronically Signed   By: Van Clines M.D.   On: 04/01/2022 18:06   Result Date: 04/01/2022 CLINICAL DATA:  Abdominal pain for 3 days. Progressive GI stromal tumor with omental and liver metastases. * Tracking Code: BO * EXAM: CT ABDOMEN AND PELVIS WITHOUT CONTRAST TECHNIQUE: Multidetector CT imaging of the abdomen and pelvis was performed following the standard protocol without IV contrast. RADIATION DOSE REDUCTION: This exam was performed according to the departmental dose-optimization program which includes automated exposure control, adjustment of the mA and/or kV according to patient size and/or use of iterative reconstruction technique. COMPARISON:  PET-CT 02/14/2022 FINDINGS: Lower chest: Subpleural reticulation in the lung bases compatible with scarring and fibrosis. Solid-appearing left breast nodule 2.2 by 1.4 cm on image 8 of series 2, formerly the same on 02/14/2022. Dilated esophagus, fluid-filled. Left anterior descending and right coronary artery atherosclerotic vascular disease. Hepatobiliary: Extensive hepatic metastatic disease with tumors in all hepatic segments. Index lesion in the anterior dome of the liver measures 4.2 by 3.8 cm on image 16 series 2, formerly 3.4  by 3.1 cm. Cholecystectomy noted. Pancreas: Atrophic pancreas, otherwise unremarkable. Spleen: Unremarkable Adrenals/Urinary Tract: 1.4 by 1.0  cm mass of the lateral limb left adrenal gland, internal density 8 Hounsfield units favoring adenoma. The kidneys appear normal. Stomach/Bowel: Marked wall thickening of the stomach with postoperative findings and confluent nodular tumor anterior to the stomach and caking of the omentum. Suspected perforation of the greater curvature of the stomach anteriorly in the vicinity of images 43 through 50 of series 2, with associated moderate scattered free intraperitoneal gas in non dependent regions. There is some fluid anterior to the gastric wall. No dilated bowel. There is descending and sigmoid colon diverticulosis. Vascular/Lymphatic: Atherosclerosis is present, including aortoiliac atherosclerotic disease. Reproductive: Uterus absent. Adnexa unremarkable. Other: Extensive omental caking of tumor, worsened compared to 02/14/2022. Moderate amount of ascites, likely malignant. Musculoskeletal: Grade 1 degenerative anterolisthesis at L4-5. Small umbilical hernia contains adipose tissue and a small amount of fluid or nodularity. IMPRESSION: 1. Suspected perforation of the greater curvature of the stomach anteriorly in the vicinity of the presume GI stromal tumor, with moderate free intraperitoneal gas in non dependent regions and moderate new likely malignant ascites. 2. Extensive hepatic metastatic disease, worsened compared to 02/14/2022. 3. Extensive omental caking of tumor, worsened compared to 02/14/2022. 4. Dilated fluid-filled esophagus, query dysmotility or gastroesophageal reflux. 5. Solid-appearing left breast nodule, formerly the same on 02/14/2022. This was not hypermetabolic on the PET-CT from 02/11/2020. 6. Other imaging findings of potential clinical significance: Coronary atherosclerosis. Small umbilical hernia contains adipose tissue and a small amount of fluid  or nodularity. Subpleural reticulation in the lung bases compatible with scarring and fibrosis. Small left adrenal lesion favoring adenoma based on density. 7.  Aortic Atherosclerosis (ICD10-I70.0).  Coronary atherosclerosis. Radiology assistant personnel have been notified to put me in telephone contact with the referring physician or the referring physician's clinical representative in order to discuss these findings. Once this communication is established I will issue an addendum to this report for documentation purposes. Electronically Signed: By: Van Clines M.D. On: 04/01/2022 17:59    Review of Systems  All other systems reviewed and are negative.  Blood pressure 114/71, pulse (!) 112, temperature 99 F (37.2 C), resp. rate 16, SpO2 94 %. Physical Exam Vitals reviewed.  Constitutional:      General: She is in acute distress.     Appearance: She is ill-appearing and toxic-appearing. She is not diaphoretic.  HENT:     Head: Normocephalic and atraumatic.     Mouth/Throat:     Comments: Dry mouth Eyes:     General: No scleral icterus.    Extraocular Movements: Extraocular movements intact.     Pupils: Pupils are equal, round, and reactive to light.  Cardiovascular:     Rate and Rhythm: Regular rhythm. Tachycardia present.     Heart sounds: Normal heart sounds. No murmur heard. Pulmonary:     Effort: Pulmonary effort is normal. No respiratory distress.     Breath sounds: Normal breath sounds. No stridor. No wheezing or rhonchi.  Abdominal:     General: Abdomen is protuberant. Bowel sounds are decreased. There is distension. There is no abdominal bruit.     Palpations: Abdomen is soft. There is hepatomegaly. There is no shifting dullness, fluid wave or splenomegaly. Mass: firmness appreciated in Left abdomen.    Tenderness: There is generalized abdominal tenderness. There is guarding.  Skin:    General: Skin is warm and dry.     Capillary Refill: Capillary refill takes more  than 3 seconds.     Coloration: Skin is pale. Skin is not cyanotic, jaundiced  or mottled.  Neurological:     General: No focal deficit present.     Mental Status: She is alert and oriented to person, place, and time.  Psychiatric:        Mood and Affect: Mood is anxious.        Behavior: Behavior normal.     Assessment/Plan: Metastatic gastric GIST Gastric perforation Lactic acidosis Sepsis Acute kidney injury and dehydration Severe protein calorie malnutrition Chronic blood loss anemia as well as anemia of chronic disease.     Unfortunately, the patient has a situation that cannot be fixed surgically.  There is no visible normal stomach.  She has a large mass with omental caking that appears to be fixed or invading the abdominal wall on the left hemiabdomen.  She has numerous peritoneal metastases on the anterior abdominal wall on both sides.  To repair a gastric perforation, one must be able to get to the stomach and mobilize it in order to locate the perforation.  At that point, one must be able to patch it, primarily repair, or resect the area of perforation.  The stomach does not appear to be able to be mobilized.  There does not appear to be omentum not involved with tumor to patch it. There also is not any normal stomach to secure the patch to.  Necrotic tumor will not hold sutures.  Also, the entire stomach appears to be involved and so this cannot be resected.   I would support her with IV fluids, pain medication, nausea medication, and antibiotics.  There does not appear to be a large amount of free air.  Occasionally these will wall off and possibly create an abscess.  I think this is unlikely.  I think this is a terminal event.  I recommend palliative care consult and potentially hospice.  I think if she does not have a significant improvement, that this is unlikely to have any resolution that results in her short-term survival beyond a few days to a few weeks.  I discussed  this with the patient and with Colleen.  I did advise the patient to stay as there is no way she can get IV pain medication at home without involvement of hospice.  Stark Klein 04/01/2022, 10:03 PM

## 2022-04-01 NOTE — ED Provider Notes (Signed)
[facility] DEPT Provider Note   CSN: [id number] Arrival date & time: 04/01/22  1506     History  Chief Complaint  Patient presents with   Abdominal Pain    Bridget Grant is a 76 y.o. female.  HPI 77 year old female with a history of gastric cancer currently on oral chemo presents with abdominal pain, vomiting and diarrhea. Her abdominal pain is severe. Originally started 2 days ago. Seemed to come on all of a sudden. No fevers. Has not had any blood when vomiting. Prior to me seeing her she has had some morphine and zofran and is feeling better but it's wearing off.   Home Medications Prior to Admission medications   Medication Sig Start Date End Date Taking? Authorizing Provider  acetaminophen (TYLENOL) 500 MG tablet Take 1,000 mg by mouth every 6 (six) hours as needed for moderate pain.    [provider]  atorvastatin (LIPITOR) 20 MG tablet TAKE 1 TABLET BY MOUTH EVERY DAY 06/27/21   [name], MD  diltiazem (CARDIZEM CD) 240 MG 24 hr capsule TAKE 1 CAPSULE(240 MG) BY MOUTH DAILY 01/11/21   [name], MD  Homeopathic Products [facility] RELIEF EX) Apply 1 application topically daily as needed (cramping).    [provider]  hydrochlorothiazide (HYDRODIURIL) 25 MG tablet Take 1 tablet (25 mg total) by mouth daily. 10/08/21   [name], MD  lisinopril (ZESTRIL) 20 MG tablet TAKE 1 TABLET BY MOUTH EVERY DAY 10/08/21   [name], MD  metFORMIN (GLUCOPHAGE-XR) 500 MG 24 hr tablet Take 2 tablets (1,000 mg total) by mouth daily with breakfast. 03/21/20   [name], MD  ondansetron (ZOFRAN ODT) 8 MG disintegrating tablet Take 1 tablet (8 mg total) by mouth every 8 (eight) hours as needed for nausea or vomiting. 03/19/21   Truitt Merle, MD  OVER THE COUNTER MEDICATION Take 1 capsule by mouth daily. Super Beets otc supplement    [provider]  pantoprazole (PROTONIX) 40 MG tablet Take 1 tablet (40 mg total) by mouth 2  (two) times daily before a meal. TAKE 1 TABLET(40 MG) BY MOUTH TWICE DAILY BEFORE A MEAL 10/08/21   [name], MD  Prenatal Vit-Fe Fumarate-FA (PRENATAL PO) Take 3 tablets by mouth daily.    [provider]  SUNItinib (SUTENT) 37.5 MG capsule TAKE 1 CAPSULE BY MOUTH EVERY OTHER DAY 03/22/22   Truitt Merle, MD  triamcinolone cream (KENALOG) 0.1 % Apply 1 application. topically 2 (two) times daily. 10/08/21 10/08/22  [name], MD  trolamine salicylate (ASPERCREME) 10 % cream Apply 1 application topically as needed for muscle pain.    [provider]  diltiazem (TIAZAC) 240 MG 24 hr capsule Take 1 capsule (240 mg total) by mouth daily. 03/18/12 03/11/13  [name], MD      Allergies    Patient has no known allergies.    Review of Systems   Review of Systems  Constitutional:  Negative for fever.  Gastrointestinal:  Positive for abdominal pain, diarrhea, nausea and vomiting.    Physical Exam Updated Vital Signs BP (!) 113/54   Pulse (!) 112   Temp 99 F (37.2 C)   Resp 16   SpO2 93%  Physical Exam Vitals and nursing note reviewed.  Constitutional:      Appearance: She is well-developed. She is ill-appearing.  HENT:     Head: Normocephalic and atraumatic.     Mouth/Throat:  Mouth: Mucous membranes are dry.  Cardiovascular:     Rate and Rhythm: Regular rhythm. Tachycardia present.     Heart sounds: Normal heart sounds.  Pulmonary:     Effort: Pulmonary effort is normal.  Abdominal:     Palpations: Abdomen is soft.     Tenderness: There is generalized abdominal tenderness. There is guarding.  Skin:    General: Skin is warm and dry.  Neurological:     Mental Status: She is alert.     ED Results / Procedures / Treatments   Labs (all labs ordered are listed, but only abnormal results are displayed) Labs Reviewed  CBC WITH DIFFERENTIAL/PLATELET - Abnormal; Notable for the following components:      Result Value   RBC 3.22 (*)    Hemoglobin 10.5 (*)     HCT 33.2 (*)    MCV 103.1 (*)    RDW 15.6 (*)    Platelets 406 (*)    nRBC 0.3 (*)    All other components within normal limits  COMPREHENSIVE METABOLIC PANEL - Abnormal; Notable for the following components:   Glucose, Bld 136 (*)    BUN 41 (*)    Creatinine, Ser 2.92 (*)    Albumin 3.0 (*)    GFR, Estimated 16 (*)    All other components within normal limits  LACTIC ACID, PLASMA - Abnormal; Notable for the following components:   Lactic Acid, Venous 3.5 (*)    All other components within normal limits  LACTIC ACID, PLASMA - Abnormal; Notable for the following components:   Lactic Acid, Venous 2.9 (*)    All other components within normal limits  CULTURE, BLOOD (ROUTINE X 2)  CULTURE, BLOOD (ROUTINE X 2)  LIPASE, BLOOD  URINALYSIS, ROUTINE W REFLEX MICROSCOPIC    EKG None  Radiology CT Abdomen Pelvis Wo Contrast  Addendum Date: 04/01/2022   ADDENDUM REPORT: 04/01/2022 18:06 ADDENDUM: The original report was by Dr. Van Clines. The following addendum is by Dr. Van Clines: Critical Value/emergent results were called by telephone at the time of interpretation on 04/01/2022 at 6:02 pm to provider Joanette Gula , who verbally acknowledged these results. Electronically Signed   By: Van Clines M.D.   On: 04/01/2022 18:06   Result Date: 04/01/2022 CLINICAL DATA:  Abdominal pain for 3 days. Progressive GI stromal tumor with omental and liver metastases. * Tracking Code: BO * EXAM: CT ABDOMEN AND PELVIS WITHOUT CONTRAST TECHNIQUE: Multidetector CT imaging of the abdomen and pelvis was performed following the standard protocol without IV contrast. RADIATION DOSE REDUCTION: This exam was performed according to the departmental dose-optimization program which includes automated exposure control, adjustment of the mA and/or kV according to patient size and/or use of iterative reconstruction technique. COMPARISON:  PET-CT 02/14/2022 FINDINGS: Lower chest: Subpleural  reticulation in the lung bases compatible with scarring and fibrosis. Solid-appearing left breast nodule 2.2 by 1.4 cm on image 8 of series 2, formerly the same on 02/14/2022. Dilated esophagus, fluid-filled. Left anterior descending and right coronary artery atherosclerotic vascular disease. Hepatobiliary: Extensive hepatic metastatic disease with tumors in all hepatic segments. Index lesion in the anterior dome of the liver measures 4.2 by 3.8 cm on image 16 series 2, formerly 3.4 by 3.1 cm. Cholecystectomy noted. Pancreas: Atrophic pancreas, otherwise unremarkable. Spleen: Unremarkable Adrenals/Urinary Tract: 1.4 by 1.0 cm mass of the lateral limb left adrenal gland, internal density 8 Hounsfield units favoring adenoma. The kidneys appear normal. Stomach/Bowel: Marked wall thickening of the stomach with postoperative  findings and confluent nodular tumor anterior to the stomach and caking of the omentum. Suspected perforation of the greater curvature of the stomach anteriorly in the vicinity of images 43 through 50 of series 2, with associated moderate scattered free intraperitoneal gas in non dependent regions. There is some fluid anterior to the gastric wall. No dilated bowel. There is descending and sigmoid colon diverticulosis. Vascular/Lymphatic: Atherosclerosis is present, including aortoiliac atherosclerotic disease. Reproductive: Uterus absent. Adnexa unremarkable. Other: Extensive omental caking of tumor, worsened compared to 02/14/2022. Moderate amount of ascites, likely malignant. Musculoskeletal: Grade 1 degenerative anterolisthesis at L4-5. Small umbilical hernia contains adipose tissue and a small amount of fluid or nodularity. IMPRESSION: 1. Suspected perforation of the greater curvature of the stomach anteriorly in the vicinity of the presume GI stromal tumor, with moderate free intraperitoneal gas in non dependent regions and moderate new likely malignant ascites. 2. Extensive hepatic metastatic  disease, worsened compared to 02/14/2022. 3. Extensive omental caking of tumor, worsened compared to 02/14/2022. 4. Dilated fluid-filled esophagus, query dysmotility or gastroesophageal reflux. 5. Solid-appearing left breast nodule, formerly the same on 02/14/2022. This was not hypermetabolic on the PET-CT from 02/11/2020. 6. Other imaging findings of potential clinical significance: Coronary atherosclerosis. Small umbilical hernia contains adipose tissue and a small amount of fluid or nodularity. Subpleural reticulation in the lung bases compatible with scarring and fibrosis. Small left adrenal lesion favoring adenoma based on density. 7.  Aortic Atherosclerosis (ICD10-I70.0).  Coronary atherosclerosis. Radiology assistant personnel have been notified to put me in telephone contact with the referring physician or the referring physician's clinical representative in order to discuss these findings. Once this communication is established I will issue an addendum to this report for documentation purposes. Electronically Signed: By: Van Clines M.D. On: 04/01/2022 17:59    Procedures .Critical Care  Performed by: Sherwood Gambler, MD Authorized by: Sherwood Gambler, MD   Critical care provider statement:    Critical care time (minutes):  40   Critical care time was exclusive of:  Separately billable procedures and treating other patients   Critical care was necessary to treat or prevent imminent or life-threatening deterioration of the following conditions:  Sepsis and renal failure   Critical care was time spent personally by me on the following activities:  Development of treatment plan with patient or surrogate, discussions with consultants, evaluation of patient's response to treatment, examination of patient, ordering and review of laboratory studies, ordering and review of radiographic studies, ordering and performing treatments and interventions, pulse oximetry, re-evaluation of patient's  condition and review of old charts     Medications Ordered in ED Medications  lactated ringers bolus 1,000 mL (has no administration in time range)  fluconazole (DIFLUCAN) IVPB 400 mg (has no administration in time range)    Followed by  fluconazole (DIFLUCAN) IVPB 200 mg (has no administration in time range)  ondansetron (ZOFRAN) injection 4 mg (4 mg Intravenous Given 04/01/22 1547)  morphine (PF) 4 MG/ML injection 4 mg (4 mg Intravenous Given 04/01/22 1547)  sodium chloride 0.9 % bolus 500 mL (0 mLs Intravenous Stopped 04/01/22 1719)  sodium chloride 0.9 % bolus 1,000 mL (0 mLs Intravenous Stopped 04/01/22 1835)  piperacillin-tazobactam (ZOSYN) IVPB 3.375 g (3.375 g Intravenous New Bag/Given 04/01/22 1830)  HYDROmorphone (DILAUDID) injection 0.5 mg (0.5 mg Intravenous Given 04/01/22 1828)  ondansetron (ZOFRAN) injection 4 mg (4 mg Intravenous Given 04/01/22 1828)    ED Course/ Medical Decision Making/ A&P  Medical Decision Making Amount and/or Complexity of Data Reviewed Labs: ordered.    Details: New AKI, lactic acidosis Radiology: independent interpretation performed.    Details: Diffuse fluid with mild free air in abdomen  Risk Prescription drug management. Decision regarding hospitalization.   Patient presents with diffuse abdominal pain.  Found to have both malignant ascites but also a likely gastric perforation.  This is probably from her cancer.  She has a new AKI with a creatinine of 2.9 and a lactic acidosis that is probably more from dehydration than sepsis.  However with her free air/perforation she will need antibiotics.  I discussed with Dr. Barry Dienes who will consult but given how complicated this is not with her AKI she is asking for hospitalist admission.  She also asked for Diflucan as well.  Patient was given IV Dilaudid, Zofran, and IV fluids in addition to the Zosyn. Discussed with Dr. [place] Crane for admission.        Final Clinical  Impression(s) / ED Diagnoses Final diagnoses:  Gastric perforation ([place])  Acute kidney injury [facility])    Rx / DC Orders ED Discharge Orders     None         Sherwood Gambler, MD 04/01/22 1942

## 2022-04-01 NOTE — ED Triage Notes (Signed)
Patient BIB EMS from home c/o abdominal pain x3days. Pt report N/V/D.  Hx of Liver CA.  BP 106/58 HR 130 RR 22 O2sat 99% on RA

## 2022-04-02 DIAGNOSIS — R4589 Other symptoms and signs involving emotional state: Secondary | ICD-10-CM

## 2022-04-02 DIAGNOSIS — K255 Chronic or unspecified gastric ulcer with perforation: Secondary | ICD-10-CM | POA: Diagnosis not present

## 2022-04-02 DIAGNOSIS — N179 Acute kidney failure, unspecified: Secondary | ICD-10-CM

## 2022-04-02 DIAGNOSIS — R52 Pain, unspecified: Secondary | ICD-10-CM

## 2022-04-02 DIAGNOSIS — Z79899 Other long term (current) drug therapy: Secondary | ICD-10-CM

## 2022-04-02 DIAGNOSIS — Z515 Encounter for palliative care: Secondary | ICD-10-CM

## 2022-04-02 DIAGNOSIS — C49A0 Gastrointestinal stromal tumor, unspecified site: Secondary | ICD-10-CM

## 2022-04-02 DIAGNOSIS — Z7189 Other specified counseling: Secondary | ICD-10-CM

## 2022-04-02 DIAGNOSIS — C799 Secondary malignant neoplasm of unspecified site: Secondary | ICD-10-CM

## 2022-04-02 LAB — CBC WITH DIFFERENTIAL/PLATELET
Abs Immature Granulocytes: 0 10*3/uL (ref 0.00–0.07)
Basophils Absolute: 0.1 10*3/uL (ref 0.0–0.1)
Basophils Relative: 2 %
Eosinophils Absolute: 0 10*3/uL (ref 0.0–0.5)
Eosinophils Relative: 0 %
HCT: 29.4 % — ABNORMAL LOW (ref 36.0–46.0)
Hemoglobin: 9.1 g/dL — ABNORMAL LOW (ref 12.0–15.0)
Immature Granulocytes: 0 %
Lymphocytes Relative: 15 %
Lymphs Abs: 0.4 10*3/uL — ABNORMAL LOW (ref 0.7–4.0)
MCH: 32.4 pg (ref 26.0–34.0)
MCHC: 31 g/dL (ref 30.0–36.0)
MCV: 104.6 fL — ABNORMAL HIGH (ref 80.0–100.0)
Monocytes Absolute: 0.1 10*3/uL (ref 0.1–1.0)
Monocytes Relative: 5 %
Neutro Abs: 2 10*3/uL (ref 1.7–7.7)
Neutrophils Relative %: 78 %
Platelets: 293 10*3/uL (ref 150–400)
RBC: 2.81 MIL/uL — ABNORMAL LOW (ref 3.87–5.11)
RDW: 15.7 % — ABNORMAL HIGH (ref 11.5–15.5)
WBC: 2.6 10*3/uL — ABNORMAL LOW (ref 4.0–10.5)
nRBC: 0 % (ref 0.0–0.2)

## 2022-04-02 LAB — COMPREHENSIVE METABOLIC PANEL
ALT: 22 U/L (ref 0–44)
AST: 45 U/L — ABNORMAL HIGH (ref 15–41)
Albumin: 2.3 g/dL — ABNORMAL LOW (ref 3.5–5.0)
Alkaline Phosphatase: 36 U/L — ABNORMAL LOW (ref 38–126)
Anion gap: 13 (ref 5–15)
BUN: 46 mg/dL — ABNORMAL HIGH (ref 8–23)
CO2: 18 mmol/L — ABNORMAL LOW (ref 22–32)
Calcium: 7.8 mg/dL — ABNORMAL LOW (ref 8.9–10.3)
Chloride: 106 mmol/L (ref 98–111)
Creatinine, Ser: 1.92 mg/dL — ABNORMAL HIGH (ref 0.44–1.00)
GFR, Estimated: 27 mL/min — ABNORMAL LOW (ref >60)
Glucose, Bld: 131 mg/dL — ABNORMAL HIGH (ref 70–99)
Potassium: 5 mmol/L (ref 3.5–5.1)
Sodium: 137 mmol/L (ref 135–145)
Total Bilirubin: 0.9 mg/dL (ref 0.3–1.2)
Total Protein: 5.7 g/dL — ABNORMAL LOW (ref 6.5–8.1)

## 2022-04-02 LAB — MAGNESIUM: Magnesium: 1.8 mg/dL (ref 1.7–2.4)

## 2022-04-02 LAB — GLUCOSE, CAPILLARY: Glucose-Capillary: 126 mg/dL — ABNORMAL HIGH (ref 70–99)

## 2022-04-02 LAB — PHOSPHORUS: Phosphorus: 4 mg/dL (ref 2.5–4.6)

## 2022-04-02 LAB — MRSA NEXT GEN BY PCR, NASAL: MRSA by PCR Next Gen: NOT DETECTED

## 2022-04-02 MED ORDER — ACETAMINOPHEN 650 MG RE SUPP: 650.0000 mg | Freq: Four times a day (QID) | RECTAL | Status: DC | PRN

## 2022-04-02 MED ORDER — PIPERACILLIN-TAZOBACTAM 3.375 G IVPB: 3.3750 g | Freq: Three times a day (TID) | INTRAVENOUS | Status: DC

## 2022-04-02 MED ORDER — PIPERACILLIN-TAZOBACTAM 3.375 G IVPB 30 MIN
3.3750 g | Freq: Three times a day (TID) | INTRAVENOUS | Status: DC
  Administered 2022-04-02: 3.375 g via INTRAVENOUS
  Filled 2022-04-02 (×2): qty 50

## 2022-04-02 MED ORDER — POLYVINYL ALCOHOL 1.4 % OP SOLN: 1.0000 [drp] | Freq: Four times a day (QID) | OPHTHALMIC | Status: DC | PRN

## 2022-04-02 MED ORDER — GLYCOPYRROLATE 0.2 MG/ML IJ SOLN: 0.2000 mg | INTRAMUSCULAR | Status: DC | PRN

## 2022-04-02 MED ORDER — ONDANSETRON HCL 4 MG/2ML IJ SOLN
4.0000 mg | Freq: Four times a day (QID) | INTRAMUSCULAR | Status: DC | PRN
  Administered 2022-04-03 – 2022-04-04 (×3): 4 mg via INTRAVENOUS
  Filled 2022-04-02 (×3): qty 2

## 2022-04-02 MED ORDER — POLYETHYLENE GLYCOL 3350 17 G PO PACK: 17.0000 g | PACK | Freq: Every day | ORAL | Status: DC | PRN

## 2022-04-02 MED ORDER — ONDANSETRON HCL 4 MG/2ML IJ SOLN
4.0000 mg | Freq: Once | INTRAMUSCULAR | Status: AC
  Administered 2022-04-02: 4 mg via INTRAVENOUS
  Filled 2022-04-02: qty 2

## 2022-04-02 MED ORDER — HYDROMORPHONE HCL 1 MG/ML IJ SOLN
1.0000 mg | INTRAMUSCULAR | Status: DC | PRN
  Administered 2022-04-02 – 2022-04-04 (×10): 1 mg via INTRAVENOUS
  Filled 2022-04-02 (×10): qty 1

## 2022-04-02 MED ORDER — ONDANSETRON HCL 4 MG/2ML IJ SOLN: 4.0000 mg | Freq: Once | INTRAMUSCULAR | Status: DC | PRN

## 2022-04-02 MED ORDER — ENOXAPARIN SODIUM 30 MG/0.3ML IJ SOSY: 30.0000 mg | PREFILLED_SYRINGE | INTRAMUSCULAR | Status: DC

## 2022-04-02 MED ORDER — BISACODYL 10 MG RE SUPP: 10.0000 mg | Freq: Every day | RECTAL | Status: DC | PRN

## 2022-04-02 MED ORDER — OXYCODONE HCL 5 MG PO TABS: 10.0000 mg | ORAL_TABLET | Freq: Four times a day (QID) | ORAL | Status: DC | PRN

## 2022-04-02 NOTE — H&P (Addendum)
History and Physical  Bridget Grant GGE:[id number] DOB: 06-27-1945 DOA: 04/01/2022  Referring physician: Dr. Regenia Skeeter, EDP  PCP: [name], MD  Outpatient Specialists: Medical oncology, general surgery Patient coming from: Home  Chief Complaint: Severe abdominal pain x 2 days.   HPI: Bridget Grant is a 76 y.o. 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  Associated with nausea, vomiting, and diarrhea.  No reported subjective fevers.  No hematemesis or hematochezia.  In the ED, work-up revealed CT findings of gastric perforation, a large mass with omental caking that appears to be fixed or invading the abdominal wall on the left hemiabdomen, numerous peritoneum metastases on the anterior abdominal wall bilaterally.  Seen by general surgery, findings not amenable to surgical repair.  Recommended palliative care and potentially hospice.  The patient was admitted by [facility], hospitalist service.  ED Course: Tmax 99.1.  BP 104/56, pulse 110, respiratory 23, O2 saturation 90% on room air.  Lab studies remarkable for serum glucose 126, BUN 21, creatinine 2.92, GFR 60, with baseline creatinine 0.8.  Hemoglobin 10.5.  MCV 103.  Platelet 206.  Review of Systems: Review of systems as noted in the HPI. All other systems reviewed and are negative.   Past Medical History:  Diagnosis Date   Anxiety    Cancer ([place]) 2021   stomach   Complication of anesthesia    Depression    Hyperlipidemia    Hypertension    Migraine    Osteopenia    PONV (postoperative nausea and vomiting)    Type II or unspecified type diabetes mellitus without mention of complication, uncontrolled 04/09/2013   Upper GI bleed 01/13/2020   Past Surgical History:  Procedure  Laterality Date   ABDOMINAL HYSTERECTOMY     APPENDECTOMY     BIOPSY  01/13/2020   Procedure: BIOPSY;  Surgeon: Milus Banister, MD;  Location: [facility] ENDOSCOPY;  Service: Endoscopy;;   BIOPSY  04/13/2020   Procedure: BIOPSY;  Surgeon: Milus Banister, MD;  Location: [facility] ENDOSCOPY;  Service: Endoscopy;;   BIOPSY  08/09/2021   Procedure: BIOPSY;  Surgeon: Milus Banister, MD;  Location: [facility] ENDOSCOPY;  Service: Gastroenterology;;   CHOLECYSTECTOMY     ESOPHAGOGASTRODUODENOSCOPY N/A 08/09/2021   Procedure: ESOPHAGOGASTRODUODENOSCOPY (EGD);  Surgeon: Milus Banister, MD;  Location: [facility] ENDOSCOPY;  Service: Gastroenterology;  Laterality: N/A;   ESOPHAGOGASTRODUODENOSCOPY (EGD) WITH PROPOFOL N/A 01/13/2020   Procedure: ESOPHAGOGASTRODUODENOSCOPY (EGD) WITH PROPOFOL;  Surgeon: Milus Banister, MD;  Location: [facility] ENDOSCOPY;  Service: Endoscopy;  Laterality: N/A;   ESOPHAGOGASTRODUODENOSCOPY (EGD) WITH PROPOFOL N/A 04/13/2020   Procedure: ESOPHAGOGASTRODUODENOSCOPY (EGD) WITH PROPOFOL;  Surgeon: Milus Banister, MD;  Location: [facility] ENDOSCOPY;  Service: Endoscopy;  Laterality: N/A;   ESOPHAGOGASTRODUODENOSCOPY (EGD) WITH PROPOFOL N/A 10/12/2020   Procedure: ESOPHAGOGASTRODUODENOSCOPY (EGD) WITH PROPOFOL;  Surgeon: Milus Banister, MD;  Location: [facility] ENDOSCOPY;  Service: Endoscopy;  Laterality: N/A;   EUS N/A 04/13/2020   Procedure: UPPER ENDOSCOPIC ULTRASOUND (EUS) LINEAR;  Surgeon: Milus Banister, MD;  Location: [facility] ENDOSCOPY;  Service: Endoscopy;  Laterality: N/A;   EUS N/A 10/12/2020   Procedure: UPPER ENDOSCOPIC ULTRASOUND (EUS) RADIAL;  Surgeon: Milus Banister, MD;  Location: [facility] ENDOSCOPY;  Service: Endoscopy;  Laterality: N/A;   EUS N/A 08/09/2021   Procedure: UPPER ENDOSCOPIC ULTRASOUND (EUS)  RADIAL;  Surgeon: Milus Banister, MD;  Location: [facility] ENDOSCOPY;  Service: Gastroenterology;  Laterality: N/A;   FINE NEEDLE ASPIRATION N/A 08/09/2021   Procedure: FINE NEEDLE ASPIRATION (FNA) LINEAR;  Surgeon: Milus Banister, MD;  Location: [facility] ENDOSCOPY;  Service: Gastroenterology;  Laterality: N/A;   PARTIAL GASTRECTOMY  02/28/2020    Social History:  reports that she has never smoked. She has never used smokeless tobacco. She reports that she does not drink alcohol and does not use drugs.   No Known Allergies  Family History  Problem Relation Age of Onset   Cancer Mother 56       breast cancer and ovarian cancer    Breast cancer Mother 10   Hypertension Father    Cancer Maternal Grandmother        unknown type of cancer       Prior to Admission medications   Medication Sig Start Date End Date Taking? Authorizing Provider  acetaminophen (TYLENOL) 500 MG tablet Take 1,000 mg by mouth every 6 (six) hours as needed for moderate pain.   Yes [provider]  atorvastatin (LIPITOR) 20 MG tablet TAKE 1 TABLET BY MOUTH EVERY DAY Patient taking differently: Take 20 mg by mouth daily. 06/27/21  Yes [name], MD  diltiazem (CARDIZEM CD) 240 MG 24 hr capsule TAKE 1 CAPSULE(240 MG) BY MOUTH DAILY Patient taking differently: Take 240 mg by mouth daily. 01/11/21  Yes [name], MD  Homeopathic Products ([place]) Apply 1 application topically daily as needed (cramping).   Yes [provider]  lisinopril (ZESTRIL) 20 MG tablet TAKE 1 TABLET BY MOUTH EVERY DAY Patient taking differently: Take 20 mg by mouth daily. 10/08/21  Yes [name], MD  metFORMIN (GLUCOPHAGE-XR) 500 MG 24 hr tablet Take 2 tablets (1,000 mg total) by mouth daily with breakfast. 03/21/20  Yes [name], MD  OVER THE COUNTER MEDICATION Take 1 capsule by mouth daily. Super Beets otc supplement   Yes [provider]  pantoprazole (PROTONIX) 40 MG tablet Take 1 tablet (40 mg total) by mouth 2 (two) times daily before a meal. TAKE 1 TABLET(40 MG) BY MOUTH TWICE DAILY BEFORE A MEAL 10/08/21  Yes [name], MD  Prenatal Vit-Fe Fumarate-FA (PRENATAL PO) Take 3 tablets by mouth daily.   Yes  [provider]  SUNItinib (SUTENT) 37.5 MG capsule TAKE 1 CAPSULE BY MOUTH EVERY OTHER DAY Patient taking differently: Take 37.5 mg by mouth every other day. 03/22/22  Yes Truitt Merle, MD  trolamine salicylate (ASPERCREME) 10 % cream Apply 1 application topically as needed for muscle pain.   Yes [provider]  hydrochlorothiazide (HYDRODIURIL) 25 MG tablet Take 1 tablet (25 mg total) by mouth daily. 10/08/21   [name], MD  ondansetron (ZOFRAN ODT) 8 MG disintegrating tablet Take 1 tablet (8 mg total) by mouth every 8 (eight) hours as needed for nausea or vomiting. Patient not taking: Reported on 04/01/2022 03/19/21   Truitt Merle, MD  triamcinolone cream (KENALOG) 0.1 % Apply 1 application. topically 2 (two) times daily. Patient not taking: Reported on 04/01/2022 10/08/21 10/08/22  [name], MD  diltiazem [facility]) 240 MG 24 hr capsule Take 1 capsule (240 mg total) by mouth daily. 03/18/12 03/11/13  [name], MD    Physical Exam: BP (!) 114/56 (BP Location: Right Arm)   Pulse (!) 109   Temp 97.9 F (36.6 C) (Oral)   Resp (!) 27  SpO2 94%   General: 76 y.o. year-old female well developed well nourished in no acute distress.  Alert and oriented x3. Cardiovascular: Regular rate and rhythm with no rubs or gallops.  No thyromegaly or JVD noted.  No lower extremity edema. 2/4 pulses in all 4 extremities. Respiratory: Clear to auscultation with no wheezes or rales. Good inspiratory effort. Abdomen: Diffusely tender with normal bowel sounds x4 quadrants. Muskuloskeletal: No cyanosis, clubbing or edema noted bilaterally Neuro: CN II-XII intact, strength, sensation, reflexes Skin: No ulcerative lesions noted or rashes Psychiatry: Judgement and insight appear normal. Mood is appropriate for condition and setting          Labs on Admission:  Basic Metabolic Panel: Recent Labs  Lab 04/01/22 1510  NA 136  K 4.5  CL 99  CO2 22  GLUCOSE 136*  BUN 41*  CREATININE  2.92*  CALCIUM 8.9   Liver Function Tests: Recent Labs  Lab 04/01/22 1510  AST 37  ALT 18  ALKPHOS 48  BILITOT 0.8  PROT 7.2  ALBUMIN 3.0*   Recent Labs  Lab 04/01/22 1510  LIPASE 17   No results for input(s): "AMMONIA" in the last 168 hours. CBC: Recent Labs  Lab 04/01/22 1510  WBC 5.9  NEUTROABS 5.0  HGB 10.5*  HCT 33.2*  MCV 103.1*  PLT 406*   Cardiac Enzymes: No results for input(s): "CKTOTAL", "CKMB", "CKMBINDEX", "TROPONINI" in the last 168 hours.  BNP (last 3 results) No results for input(s): "BNP" in the last 8760 hours.  ProBNP (last 3 results) No results for input(s): "PROBNP" in the last 8760 hours.  CBG: No results for input(s): "GLUCAP" in the last 168 hours.  Radiological Exams on Admission: CT Abdomen Pelvis Wo Contrast  Addendum Date: 04/01/2022   ADDENDUM REPORT: 04/01/2022 18:06 ADDENDUM: The original report was by Dr. Van Clines. The following addendum is by Dr. Van Clines: Critical Value/emergent results were called by telephone at the time of interpretation on 04/01/2022 at 6:02 pm to provider Joanette Gula , who verbally acknowledged these results. Electronically Signed   By: Van Clines M.D.   On: 04/01/2022 18:06   Result Date: 04/01/2022 CLINICAL DATA:  Abdominal pain for 3 days. Progressive GI stromal tumor with omental and liver metastases. * Tracking Code: BO * EXAM: CT ABDOMEN AND PELVIS WITHOUT CONTRAST TECHNIQUE: Multidetector CT imaging of the abdomen and pelvis was performed following the standard protocol without IV contrast. RADIATION DOSE REDUCTION: This exam was performed according to the departmental dose-optimization program which includes automated exposure control, adjustment of the mA and/or kV according to patient size and/or use of iterative reconstruction technique. COMPARISON:  PET-CT 02/14/2022 FINDINGS: Lower chest: Subpleural reticulation in the lung bases compatible with scarring and fibrosis.  Solid-appearing left breast nodule 2.2 by 1.4 cm on image 8 of series 2, formerly the same on 02/14/2022. Dilated esophagus, fluid-filled. Left anterior descending and right coronary artery atherosclerotic vascular disease. Hepatobiliary: Extensive hepatic metastatic disease with tumors in all hepatic segments. Index lesion in the anterior dome of the liver measures 4.2 by 3.8 cm on image 16 series 2, formerly 3.4 by 3.1 cm. Cholecystectomy noted. Pancreas: Atrophic pancreas, otherwise unremarkable. Spleen: Unremarkable Adrenals/Urinary Tract: 1.4 by 1.0 cm mass of the lateral limb left adrenal gland, internal density 8 Hounsfield units favoring adenoma. The kidneys appear normal. Stomach/Bowel: Marked wall thickening of the stomach with postoperative findings and confluent nodular tumor anterior to the stomach and caking of the omentum. Suspected perforation of the greater  curvature of the stomach anteriorly in the vicinity of images 43 through 50 of series 2, with associated moderate scattered free intraperitoneal gas in non dependent regions. There is some fluid anterior to the gastric wall. No dilated bowel. There is descending and sigmoid colon diverticulosis. Vascular/Lymphatic: Atherosclerosis is present, including aortoiliac atherosclerotic disease. Reproductive: Uterus absent. Adnexa unremarkable. Other: Extensive omental caking of tumor, worsened compared to 02/14/2022. Moderate amount of ascites, likely malignant. Musculoskeletal: Grade 1 degenerative anterolisthesis at L4-5. Small umbilical hernia contains adipose tissue and a small amount of fluid or nodularity. IMPRESSION: 1. Suspected perforation of the greater curvature of the stomach anteriorly in the vicinity of the presume GI stromal tumor, with moderate free intraperitoneal gas in non dependent regions and moderate new likely malignant ascites. 2. Extensive hepatic metastatic disease, worsened compared to 02/14/2022. 3. Extensive omental caking  of tumor, worsened compared to 02/14/2022. 4. Dilated fluid-filled esophagus, query dysmotility or gastroesophageal reflux. 5. Solid-appearing left breast nodule, formerly the same on 02/14/2022. This was not hypermetabolic on the PET-CT from 02/11/2020. 6. Other imaging findings of potential clinical significance: Coronary atherosclerosis. Small umbilical hernia contains adipose tissue and a small amount of fluid or nodularity. Subpleural reticulation in the lung bases compatible with scarring and fibrosis. Small left adrenal lesion favoring adenoma based on density. 7.  Aortic Atherosclerosis (ICD10-I70.0).  Coronary atherosclerosis. Radiology assistant personnel have been notified to put me in telephone contact with the referring physician or the referring physician's clinical representative in order to discuss these findings. Once this communication is established I will issue an addendum to this report for documentation purposes. Electronically Signed: By: Van Clines M.D. On: 04/01/2022 17:59    EKG: I independently viewed the EKG done and my findings are as followed: None available at the time of this visit.  Assessment/Plan Present on Admission:  Gastric perforation [facility])  Principal Problem:   Gastric perforation (HCC)  Gastric perforation in the setting of metastatic gastric GIST, POA Per General surgery, not amenable to surgical repair, recommendation for palliative care and potentially hospice Palliative care team consulted to assist with establishing goals of care. DNR/DNI per the patient. Supportive care IV fluconazole IV antibiotics, Zosyn Obtain MRSA screening test Follow cultures IV fluid hydration IV analgesics along with bowel regimen as needed IV antiemetics  Prerenal AKI from poor oral intake, dehydration Baseline creatinine 0.8 with GFR greater than 60 Presented with creatinine of 2.9 with GFR of 60 Continue IV fluid hydration Repeat chemistry panel in the  morning  Type 2 diabetes with hyperglycemia Mildly hyperglycemia, serum glucose 136 Strict n.p.o. due to gastric perforation On IV fluid maintenance D5 normal saline  Anemia of chronic disease Hemoglobin at baseline 10.5.   No overt bleeding. Monitor H&H  Goals of care Likely this is a terminal event Palliative care team consulted DNI/DNI     DVT prophylaxis: Subcu Lovenox daily  Code Status: DNR/DNI  Family Communication: Family member at bedside  Disposition Plan: Admitted to stepdown unit  Consults called: General surgery, palliative care team.  Admission status: Inpatient status.   Status is: Inpatient The patient requires at least 2 midnights for further evaluation and treatment of present condition.   Kayleen Memos MD [facility] Pager [phone]  If 7PM-7AM, please contact night-coverage [email or web address] Password [email or web address]  04/02/2022, 12:42 AM

## 2022-04-02 NOTE — Progress Notes (Signed)
PHARMACY NOTE:  ANTIMICROBIAL RENAL DOSAGE ADJUSTMENT  Current antimicrobial regimen includes a mismatch between antimicrobial dosage and estimated renal function.  As per policy approved by the Pharmacy & Therapeutics and Medical Executive Committees, the antimicrobial dosage will be adjusted accordingly.  Current antimicrobial dosage:  Zosyn 3.375gm IV q8h infused over 30 min   Indication: IAI  Renal Function:  Estimated Creatinine Clearance: 23.4 mL/min (A) (by C-G formula based on SCr of 1.92 mg/dL (H)). '[]'$      On intermittent HD, scheduled: '[]'$      On CRRT    Antimicrobial dosage has been changed to:  Zosyn 3.375gm IV Q8h to be infused over 4hrs   Additional comments:   Thank you for allowing pharmacy to be a part of this patient's care.  Netta Cedars, [facility] 04/02/2022 5:49 AM

## 2022-04-02 NOTE — Progress Notes (Signed)
  Daily Progress Note   Patient Name: SANVI EHLER       Date: 04/02/2022 DOB: 1945-07-02  Age: 76 y.o. MRN#: [id number] Attending Physician: Hosie Poisson, MD Primary Care Physician: [name], MD Admit Date: 04/01/2022 Length of Stay: 1 day  This provider will complete full consult note as soon as able. Placing note for update regarding patient's current care. Patient has decided to transition to full comfort care. Discontinuing all medications not focused on symptom management including abx. TOC consult placed as patient open to referral to residential hospice (gastric perforation with underlying met GIST cancer no long on abx, AKI, needing IV medications for symptom management, prognosis of days to short week(s)).   Chelsea Aus, DO Palliative Care Provider PMT # [phone]

## 2022-04-02 NOTE — Progress Notes (Signed)
Pt seen, admitted earlier this am by Dr Nevada Crane.   Bridget Grant is a 76 y.o. 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  Associated with nausea, vomiting, and diarrhea.   In the ED, work-up revealed CT findings of gastric perforation, a large mass with omental caking that appears to be fixed or invading the abdominal wall on the left hemiabdomen, numerous peritoneum metastases on the anterior abdominal wall bilaterally.  Seen by general surgery, findings not amenable to surgical repair.  Recommended palliative care and potentially hospice.  The patient was admitted by [facility], hospitalist service.  On exam.  General exam: ill appearing lady in mid discomfort.  Respiratory system: diminished at bases.  Cardiovascular system: S1 & S2 heard, RRR.  Gastrointestinal system: Abdomen is distended, bs+ Central nervous system: Alert  and answering questions appropriately.  Extremities: no cyanosis Skin: No rashes,   Palliative care consulted. She has decided to transition to comfort care.     Hosie Poisson MD.

## 2022-04-02 NOTE — Progress Notes (Signed)
Checked back in with Ms. Cloe.  Gastric perforation with advanced GIST cancer.  Says her pain is okay but very tender on exam.  Unfortunately, with the extent of disease, she does not have a reasonable surgical option and hospice care is recommended.  Discussed with patient and sister-in-law Colleen at bedside.  They asked when palliative team would be stopping by.  Surgery team will be available as needed.  Felicie Morn, MD General, Bariatric and Minimally Invasive Surgery [place]

## 2022-04-02 NOTE — Consult Note (Signed)
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  Since being admitted, CT imaging revealed findings of gastric perforation, a large mass with omental caking that appeared to be fixed or invading the abdominal wall on the left hemiabdomen, and numerous peritoneal needle metastases on the anterior abdominal wall bilaterally which had all worsened since imaging on 02/14/2022.  Patient was evaluated by surgery who noted that unfortunately due to the extent of her metastatic disease, surgical intervention was not appropriate as her situation cannot be fixed surgically.  Palliative care consulted to assist with complex medical decision-making.    Extensive chart review prior to seeing patient.  Also discussed care with bedside RN who was able to call patient's relative, Jaclyn Shaggy, who wanted to be present to assist patient with medical conversations.  Jaclyn Shaggy works at [employer] and so was able to present to bedside shortly after call from [employer]. ------------------------------------------------------------------------------------------------------------- [employer]  Pertinent diagnosis: Gastric perforation, metastatic gastric GIST   The patient and/or family consented to a voluntary Solicitor. Individuals present for the conversation: Patient, relative-Colleen, this palliative provider  Summary of  the conversation:  Presented to bedside and introduced myself and the role of the palliative care team and patient's care.  Jaclyn Shaggy was present at bedside and patient agreed to continue conversation with her presence.  Patient laying in bed when seen.  Patient noted her mouth to be very dry and so difficult to speak all that had happened.  With patient's permission, Jaclyn Shaggy was able to offer details regarding patient's medical care as well.  Patient has been updated that she has a gastric perforation and that her prognosis is likely days to short weeks.  Patient has heard that the focus of care should be primarily on pain management and symptom management at the end of her life.  Patient noted that though she has been "preparing for this" it still feels like it happened so suddenly.  Acknowledged this and empathized with difficult situation.  Discussed patient's medical care moving forward.  Patient agrees with wanting to focus on her symptom management at the end of life.  We discussed that patient is currently continuing with interventions such as antibiotics and lab draws.  Expressed concerned regarding antibiotics and adverse effects they can have.  Patient agreed with wanting to only focus on medications that provide her symptom relief such as management of her pain and anxiety.  Agreed to discontinue antibiotics and interventions that are not aimed at symptom management.  Discussed this would be transitioning to full comfort care.  Patient also open to the idea of residential hospice.  Colleen and patient had discussed to be can place and so patient is open to referral to going there.  Noted would inform case manager to assist with transition of care.  Outcome of the conversations and/or documents completed:  Patient transitioned to full comfort care at this time.  Planning for referral to residential  hospice.  I spent 28 minutes providing separately identifiable ACP services with the patient and/or  surrogate decision maker in a voluntary, in-person conversation discussing the patient's wishes and goals as detailed in the above note.  Chelsea Aus, DO Palliative Care Provider  -------------------------------------------------------------------------------------------------------------  At this time patient feels her symptoms are managed with the IV Dilaudid.  Patient currently receiving IV Dilaudid 0.5 to 2 mg every 3 hours as needed.  Patient recently received 1 mg x 2 doses.  Noted if patient should have any worsening symptoms, please inform staff so we could further adjust medications. Patient's main concern was that her mouth was so dry and she just wanted a sip of water.  We discussed that with focusing on her comfort, she would be allowed to have comfort feeds and drink water if that is what she wanted to do.  Noted if this causes her worsening pain due to her gastric perforation, we can provide her pain medications for management.  Patient agreed with this and very thankful to take a sip of water.  This provider, with the assistance of the bedside RN, was able to provide ice chips and water at patient's request.  Patient's and Colleen's main concern is patient's husband.  Patient is the 100% caregiver for her debilitated husband.  Right now [place] significant other is looking after patient's husband though this cannot be continued indefinitely.  They were both asking for resources to help support the patient such as needing placement.  Noted this provider was unsure how to best accomplish this though would involve case manager to assist with any resources they do have.  Inquired with patient if she believes her husband is able to make complex medical decisions on his own and she question his ability to do so noting that he is sometimes confused.  Encouraged conversations with case manager to further help alleviate any burden that could be accomplished while patient was here in the hospital or  at residential hospice regarding her husband's care.  All questions answered at that time.  Thank patient and Jaclyn Shaggy for allowing this provider to visit today.  Updated staff and orders after visit with patient to transition to a comfort care focus at this time.  Primary Diagnoses  Present on Admission:  Gastric perforation ([place])   Palliative Review of Systems: Patient feels abdominal pain improved currently with IV Dilaudid.  I have reviewed the medical record, interviewed the patient and family, and examined the patient. The following aspects are pertinent.  Past Medical History:  Diagnosis Date   Anxiety    Cancer ([place]) 2021   stomach   Complication of anesthesia    Depression    Hyperlipidemia    Hypertension    Migraine    Osteopenia    PONV (postoperative nausea and vomiting)    Type II or unspecified type diabetes mellitus without mention of complication, uncontrolled 04/09/2013   Upper GI bleed 01/13/2020   Social History   Socioeconomic History   Marital status: Married    Spouse name: Not on file   Number of children: 0   Years of education: Not on file   Highest education level: Not on file  Occupational History   Occupation: retired   Tobacco Use   Smoking status: Never   Smokeless tobacco: Never  Vaping Use   Vaping Use: Never used  Substance and Sexual Activity   Alcohol use: No   Drug use: No   Sexual activity: Not on file  Other  Topics Concern   Not on file  Social History Narrative   Not on file   Social Determinants of Health   Financial Resource Strain: Low Risk  (12/05/2021)   Overall Financial Resource Strain (CARDIA)    Difficulty of Paying Living Expenses: Not hard at all  Food Insecurity: No Food Insecurity (04/01/2022)   Hunger Vital Sign    Worried About Running Out of Food in the Last Year: Never true    Ran Out of Food in the Last Year: Never true  Transportation Needs: No Transportation Needs (04/01/2022)   PRAPARE -  Hydrologist (Medical): No    Lack of Transportation (Non-Medical): No  Physical Activity: Sufficiently Active (12/05/2021)   Exercise Vital Sign    Days of Exercise per Week: 5 days    Minutes of Exercise per Session: 30 min  Stress: No Stress Concern Present (12/05/2021)   [place]    Feeling of Stress : Not at all  Social Connections: Unknown (12/05/2021)   Social Connection and Isolation Panel [NHANES]    Frequency of Communication with Friends and Family: More than three times a week    Frequency of Social Gatherings with Friends and Family: More than three times a week    Attends Religious Services: Patient refused    Active Member of Clubs or Organizations: Patient refused    Attends Archivist Meetings: Patient refused    Marital Status: Married   Family History  Problem Relation Age of Onset   Cancer Mother 77       breast cancer and ovarian cancer    Breast cancer Mother 45   Hypertension Father    Cancer Maternal Grandmother        unknown type of cancer    Scheduled Meds:  Chlorhexidine Gluconate Cloth  6 each Topical Daily   enoxaparin (LOVENOX) injection  30 mg Subcutaneous Q24H   mouth rinse  15 mL Mouth Rinse 4 times per day   Continuous Infusions:  dextrose 5 % and 0.9% NaCl 125 mL/hr at 04/02/22 0400   fluconazole (DIFLUCAN) IV     piperacillin-tazobactam (ZOSYN)  IV     promethazine (PHENERGAN) injection (IM or IVPB)     PRN Meds:.bisacodyl, HYDROmorphone (DILAUDID) injection, LORazepam, ondansetron (ZOFRAN) IV, mouth rinse, promethazine **OR** promethazine (PHENERGAN) injection (IM or IVPB) **OR** promethazine No Known Allergies CBC:    Component Value Date/Time   WBC 2.6 (L) 04/02/2022 0313   HGB 9.1 (L) 04/02/2022 0313   HGB 10.0 (L) 03/19/2022 1408   HCT 29.4 (L) 04/02/2022 0313   PLT 293 04/02/2022 0313   PLT 474 (H) 03/19/2022 1408   MCV  104.6 (H) 04/02/2022 0313   NEUTROABS 2.0 04/02/2022 0313   LYMPHSABS 0.4 (L) 04/02/2022 0313   MONOABS 0.1 04/02/2022 0313   EOSABS 0.0 04/02/2022 0313   BASOSABS 0.1 04/02/2022 0313   Comprehensive Metabolic Panel:    Component Value Date/Time   NA 137 04/02/2022 0313   K 5.0 04/02/2022 0313   CL 106 04/02/2022 0313   CO2 18 (L) 04/02/2022 0313   BUN 46 (H) 04/02/2022 0313   CREATININE 1.92 (H) 04/02/2022 0313   CREATININE 1.03 (H) 03/19/2022 1408   CREATININE 0.81 01/11/2020 1436   GLUCOSE 131 (H) 04/02/2022 0313   CALCIUM 7.8 (L) 04/02/2022 0313   AST 45 (H) 04/02/2022 0313   AST 19 03/19/2022 1408   ALT 22 04/02/2022 0313  ALT 9 03/19/2022 1408   ALKPHOS 36 (L) 04/02/2022 0313   BILITOT 0.9 04/02/2022 0313   BILITOT 0.4 03/19/2022 1408   PROT 5.7 (L) 04/02/2022 0313   ALBUMIN 2.3 (L) 04/02/2022 0313    Physical Exam: Vital Signs: BP (!) 119/56   Pulse (!) 109   Temp 98.2 F (36.8 C) (Axillary)   Resp 16   Ht '5\' 2"'$  (1.575 m)   Wt 68.9 kg   SpO2 90%   BMI 27.78 kg/m  SpO2: SpO2: 90 % O2 Device: O2 Device: Nasal Cannula O2 Flow Rate: O2 Flow Rate (L/min): 2 L/min Intake/output summary:  Intake/Output Summary (Last 24 hours) at 04/02/2022 0747 Last data filed at 04/02/2022 0400 Gross per 24 hour  Intake 3029.65 ml  Output --  Net 3029.65 ml   LBM: Last BM Date : 04/01/22 Baseline Weight: Weight: 68.9 kg Most recent weight: Weight: 68.9 kg  General: Laying in bed, ill-appearing, NAD Eyes: conjunctiva clear, anicteric sclera HENT: Dry mucous membranes Cardiovascular: Tachycardia noted Respiratory: no increase work of breathing noted, not in respiratory distress Abdomen: Tender Extremities: no edema in LE b/l Skin: no rashes or lesions on visible skin Neuro: A&Ox4, following commands easily Psych: appropriately answers all questions          Palliative Performance Scale: 20%           Additional Data Reviewed: Recent Labs    04/01/22 1510  04/02/22 0313  WBC 5.9 2.6*  HGB 10.5* 9.1*  PLT 406* 293  NA 136 137  BUN 41* 46*  CREATININE 2.92* 1.92*    Imaging: CT Abdomen Pelvis Wo Contrast Addendum: ADDENDUM REPORT: 04/01/2022 18:06   ADDENDUM:  The original report was by Dr. Van Clines. The following  addendum is by Dr. Van Clines:   Critical Value/emergent results were called by telephone at the time  of interpretation on 04/01/2022 at 6:02 pm to provider Joanette Gula  , who verbally acknowledged these results.   Electronically Signed    By: Van Clines M.D.    On: 04/01/2022 18:06 Narrative: CLINICAL DATA:  Abdominal pain for 3 days. Progressive GI stromal tumor with omental and liver metastases.  * Tracking Code: BO *  EXAM: CT ABDOMEN AND PELVIS WITHOUT CONTRAST  TECHNIQUE: Multidetector CT imaging of the abdomen and pelvis was performed following the standard protocol without IV contrast.  RADIATION DOSE REDUCTION: This exam was performed according to the departmental dose-optimization program which includes automated exposure control, adjustment of the mA and/or kV according to patient size and/or use of iterative reconstruction technique.  COMPARISON:  PET-CT 02/14/2022  FINDINGS: Lower chest: Subpleural reticulation in the lung bases compatible with scarring and fibrosis. Solid-appearing left breast nodule 2.2 by 1.4 cm on image 8 of series 2, formerly the same on 02/14/2022.  Dilated esophagus, fluid-filled. Left anterior descending and right coronary artery atherosclerotic vascular disease.  Hepatobiliary: Extensive hepatic metastatic disease with tumors in all hepatic segments. Index lesion in the anterior dome of the liver measures 4.2 by 3.8 cm on image 16 series 2, formerly 3.4 by 3.1 cm. Cholecystectomy noted.  Pancreas: Atrophic pancreas, otherwise unremarkable.  Spleen: Unremarkable  Adrenals/Urinary Tract: 1.4 by 1.0 cm mass of the lateral limb  left adrenal gland, internal density 8 Hounsfield units favoring adenoma. The kidneys appear normal.  Stomach/Bowel: Marked wall thickening of the stomach with postoperative findings and confluent nodular tumor anterior to the stomach and caking of the omentum. Suspected perforation of the greater curvature of  the stomach anteriorly in the vicinity of images 43 through 50 of series 2, with associated moderate scattered free intraperitoneal gas in non dependent regions. There is some fluid anterior to the gastric wall. No dilated bowel. There is descending and sigmoid colon diverticulosis.  Vascular/Lymphatic: Atherosclerosis is present, including aortoiliac atherosclerotic disease.  Reproductive: Uterus absent. Adnexa unremarkable.  Other: Extensive omental caking of tumor, worsened compared to 02/14/2022. Moderate amount of ascites, likely malignant.  Musculoskeletal: Grade 1 degenerative anterolisthesis at L4-5. Small umbilical hernia contains adipose tissue and a small amount of fluid or nodularity.  IMPRESSION: 1. Suspected perforation of the greater curvature of the stomach anteriorly in the vicinity of the presume GI stromal tumor, with moderate free intraperitoneal gas in non dependent regions and moderate new likely malignant ascites. 2. Extensive hepatic metastatic disease, worsened compared to 02/14/2022. 3. Extensive omental caking of tumor, worsened compared to 02/14/2022. 4. Dilated fluid-filled esophagus, query dysmotility or gastroesophageal reflux. 5. Solid-appearing left breast nodule, formerly the same on 02/14/2022. This was not hypermetabolic on the PET-CT from 02/11/2020. 6. Other imaging findings of potential clinical significance: Coronary atherosclerosis. Small umbilical hernia contains adipose tissue and a small amount of fluid or nodularity. Subpleural reticulation in the lung bases compatible with scarring and fibrosis. Small left adrenal lesion  favoring adenoma based on density. 7.  Aortic Atherosclerosis (ICD10-I70.0).  Coronary atherosclerosis.  Radiology assistant personnel have been notified to put me in telephone contact with the referring physician or the referring physician's clinical representative in order to discuss these findings. Once this communication is established I will issue an addendum to this report for documentation purposes.  Electronically Signed: By: Van Clines M.D. On: 04/01/2022 17:59    I personally reviewed recent imaging.   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  Since being admitted, CT imaging revealed findings of gastric perforation, a large mass with omental caking that appeared to be fixed or invading the abdominal wall on the left hemiabdomen, and numerous peritoneal needle metastases on the anterior abdominal wall bilaterally which had all worsened since imaging on 02/14/2022.  Patient was evaluated by surgery who noted that unfortunately due to the extent of her metastatic disease, surgical intervention was not appropriate as her situation cannot be fixed surgically.  Palliative care consulted to assist with complex medical decision-making.    # Complex medical decision making/goals of care  -Extensive discussion with patient as described above in HPI.  Patient's relative, Jaclyn Shaggy, present at bedside during conversation as well.  Patient agrees to transition to comfort care focus at this time.  We will appropriately discontinue antibiotics, IV fluids, lab work, and interventions that are not directed at  patient's symptom management at the end of life.  Allowing comfort feeds; patient just wants to be able to drink water even though she knows this could worsen her pain and perforation site.  -Patient open to referral to residential hospice at this time.  Residential hospice referral appropriate based on patient's gastric perforation in the setting of metastatic GIST.  Patient currently has AKI in the setting of worsening oral intake as well; would expect this to worsen with discontinuation of IV  fluids and continued poor oral intake.  Patient also requiring IV pain medication for symptom management.  -Patient primary concern is regarding her husband's care as she is the 100% caregiver for her husband who lives at home .  She knows husband cannot remain at home without a 24/7 caregiver.  Unsure of options available to support his care moving forward the noted would inform case manager who may have resources for family to follow-up on to continue his care.  -  Code Status: DNR  Prognosis: Days to short weeks  # Symptom management  -Pain, severe acute pain in the setting of gastric perforation with underlying metastatic GIST tumor   -Patient currently receiving IV Dilaudid 0.5 mg - 2 mg every 3 hours as needed.  Have adjusted medication to allow for Dilaudid IV 1 mg every hour as needed pain or dyspnea as focusing on patient's comfort at the end of life.  Should patient's pain worsen, would recommend increasing IV Dilaudid dose.  If requiring frequent doses, may need to consider continuous infusion via PCA.   -Could consider IV steroids if needed for anti-inflammatory benefit.  Currently pain controlled with IV Dilaudid.   -Anxiety, in the setting of gastric perspiration and end-of-life care   -Patient has IV lorazepam 0.5-1 mg every 6 hours as needed.  If needed could consider increasing dose versus adjusting frequency to every 4 hours as needed.  # Psycho-social/Spiritual Support:  - Support System:  Brother, sister-in-law; patient's husband is debilitated at home - Desire for further Chaplain support:yes; discussed care with chaplain  # Discharge Planning:  Hospice facility  Thank you for allowing the palliative care team to participate in the care [name].  Chelsea Aus, DO Palliative Care Provider PMT # [phone]  If patient remains symptomatic despite maximum doses, please call PMT at [phone] between 0700 and 1900. Outside of these hours, please call attending, as PMT does not have night coverage.

## 2022-04-02 NOTE — Progress Notes (Signed)
Chaplain was consulted to meet with Marlowe Kays to provide support.  She stated that she has been preparing herself for this from the time she was first diagnosed and even more so after she relapsed.  She is most concerned about her husband and his care needs. Chaplain provided listening and support.  Aliyanah became very sleepy after her pain medication and chaplain will plan to follow up tomorrow.  [address], [place] Pager, [phone]

## 2022-04-02 NOTE — TOC Initial Note (Signed)
Transition of Care [facility]) - Initial/Assessment Note    Patient Details  Name: Bridget Grant MRN: [id number] Date of Birth: Jul 13, 1945  Transition of Care [facility]) CM/SW Contact:    Dessa Phi, RN Phone Number: 04/02/2022, 11:05 AM  Clinical Narrative: Will await Palliative meeting cons & recc.                  Expected Discharge Plan:  (TBD) Barriers to Discharge: Continued Medical Work up   Patient Goals and CMS Choice        Expected Discharge Plan and Services Expected Discharge Plan:  (TBD)                                              Prior Living Arrangements/Services                       Activities of Daily Living Home Assistive Devices/Equipment: Eyeglasses, Cane (specify quad or straight) (reading glasses) ADL Screening (condition at time of admission) Patient's cognitive ability adequate to safely complete daily activities?: Yes Is the patient deaf or have difficulty hearing?: No Does the patient have difficulty seeing, even when wearing glasses/contacts?: No Does the patient have difficulty concentrating, remembering, or making decisions?: Yes ("sometimes") Patient able to express need for assistance with ADLs?: Yes Does the patient have difficulty dressing or bathing?: No Independently performs ADLs?: No Communication: Independent Dressing (OT): Independent Grooming: Independent Feeding: Independent Bathing: Independent Toileting: Needs assistance Is this a change from baseline?: Pre-admission baseline In/Out Bed: Needs assistance Is this a change from baseline?: Pre-admission baseline Walks in Home: Needs assistance Is this a change from baseline?: Pre-admission baseline Does the patient have difficulty walking or climbing stairs?: Yes Weakness of Legs: Both Weakness of Arms/Hands: Both  Permission Sought/Granted                  Emotional Assessment              Admission diagnosis:  Gastric perforation ([place])  [K25.5] Acute kidney injury ([place]) [N17.9] Patient Active Problem List   Diagnosis Date Noted   Gastric perforation ([place]) 04/01/2022   Rash and nonspecific skin eruption 10/09/2021   Low back pain 11/15/2020   Aortic atherosclerosis ([place]) 05/17/2020   Gastrointestinal stromal tumor (GIST) ([place]) 02/29/2020   Iron deficiency anemia due to chronic blood loss 01/23/2020   Malignant gastrointestinal stromal tumor (GIST) of stomach ([place]) 01/23/2020   Gastric mass 01/14/2020   Upper GI bleed 01/13/2020   Acute blood loss anemia 01/13/2020   Hematemesis without nausea 01/11/2020   Melena 01/11/2020   Vitamin D deficiency 11/16/2019   Vitamin B12 deficiency 11/16/2019   Lipoma 04/14/2015   Osteoarthritis of left knee 04/01/2014   Left knee pain 03/22/2014   Venous (peripheral) insufficiency 03/22/2014   Diabetes ([place]) 04/09/2013   Migraine    Preventative health care 05/28/2011   MENOPAUSAL DISORDER 12/01/2009   LEG PAIN, LEFT 02/03/2008   TREMOR 02/03/2008   Hyperlipidemia 05/27/2007   ANXIETY 05/27/2007   Depression 05/27/2007   POSITIONAL VERTIGO 05/27/2007   Essential hypertension 05/27/2007   OSTEOPENIA 05/27/2007   PCP:  [name], MD Pharmacy:   [facility] [phone] - [place], [address] AT [place] [address] Phone: [phone] Fax: [place] -  [place], [place] - [address] [place] [address] Phone: [phone] Fax: [phone]  [facility]) [place], [place] [address] [place] [address] Phone: [phone] Fax: [phone]  [facility] [place], [place] [address] [place] [address] Phone: [phone] Fax: [phone]     Social Determinants of Health (SDOH) Interventions     Readmission Risk Interventions    03/02/2020    4:00 PM  Readmission Risk Prevention Plan  Post Dischage Appt Patient refused  Appt Comments pt will schedule her own  Medication Screening Complete  Transportation Screening Complete

## 2022-04-03 DIAGNOSIS — K255 Chronic or unspecified gastric ulcer with perforation: Secondary | ICD-10-CM | POA: Diagnosis not present

## 2022-04-03 NOTE — Progress Notes (Signed)
  [facility] Progress Note  Patient: Bridget Grant     WUJ:[id number]  DOA: 04/01/2022   PCP: [name], MD       Brief hospital course: This is a 76 year old female with metastatic GIST tumor, diabetes mellitus who presents to the hospital for severe abdominal pain.  In the ED CT imaging reveals: Suspected perforation of the stomach with moderate amount of intraperitoneal free air, increasing hepatic metastatic disease& omental caking 1 compared to 02/14/2022. In the ED, she was evaluated by surgery who did not feel that she was a candidate for further surgery hospice was recommended.  The patient agreed to this plan.  Subjective:  She complains of some abdominal pain and occasional bouts of nausea. Assessment and Plan: Principal Problem:   Gastric perforation related to metastatic GIST tumor -Status post gastrectomy in 2021-developed recurrence and declined surgery in February 2023 - has followed with Dr Burr Medico, oncology, for treatment - continue comfort care with plan for hospice home        Code Status: DNR   Level of Care: Level of care: Palliative Care  Objective:   Vitals:   04/02/22 2010 04/03/22 0000 04/03/22 0818 04/03/22 0819  BP: (!) 132/52  (!) 124/57   Pulse: (!) 103  (!) 101   Resp: (!) 22  (!) 26 20  Temp:   98.3 F (36.8 C)   TempSrc:   Axillary   SpO2: 97% 92% 98%   Weight:      Height:       Filed Weights   04/02/22 0222  Weight: 68.9 kg   Exam: General exam: Appears comfortable  HEENT: oral mucosa dry Respiratory system: Clear to auscultation.  Cardiovascular system: S1 & S2 heard  Gastrointestinal system: Abdomen soft, moderately tender in lower abdomen, distended, no bowel sounds    Imaging and lab data was personally reviewed    CBC: Recent Labs  Lab 04/01/22 1510 04/02/22 0313  WBC 5.9 2.6*  NEUTROABS 5.0 2.0  HGB 10.5* 9.1*  HCT 33.2* 29.4*  MCV 103.1* 104.6*  PLT 406* 956   Basic Metabolic Panel: Recent Labs  Lab  04/01/22 1510 04/02/22 0313  NA 136 137  K 4.5 5.0  CL 99 106  CO2 22 18*  GLUCOSE 136* 131*  BUN 41* 46*  CREATININE 2.92* 1.92*  CALCIUM 8.9 7.8*  MG  --  1.8  PHOS  --  4.0   GFR: Estimated Creatinine Clearance: 23.4 mL/min (A) (by C-G formula based on SCr of 1.92 mg/dL (H)).  Scheduled Meds:  Chlorhexidine Gluconate Cloth  6 each Topical Daily   mouth rinse  15 mL Mouth Rinse 4 times per day   Continuous Infusions:  promethazine (PHENERGAN) injection (IM or IVPB)       LOS: 2 days   Author: Debbe Odea  04/03/2022 3:47 PM

## 2022-04-03 NOTE — Progress Notes (Signed)
Chaplain followed up with Bridget Grant and provided listening as she shared about concerns for the people she is leaving behind, but mostly her husband.  She has had to minimize her socializing in recent years due to her own health and that of her husbands and so she has several friends whom she wishes she could see.  Some of them plan to come and see her.  She is still very concerned about her husband trying to stay at home when she knows the amount of support that he needs.  She still feels that her brother and sister in law will do what they can to help him in his next steps and she hopes that he will accept that.  She is at peace with her own death and is looking forward to reuniting with those who have gone on before her.  [address] [place] Pager, [phone]

## 2022-04-03 NOTE — TOC Progression Note (Addendum)
Transition of Care [facility]) - Progression Note    Patient Details  Name: Bridget Grant MRN: [id number] Date of Birth: 1945-07-07  Transition of Care [facility]) CM/SW [place], RN Phone Number: 04/03/2022, 3:53 PM  Clinical Narrative:   Received [facility] consult for residential hospice services. Spoke with patient at bedside to offer choice. Patient chose [facility] Patient request this RNCM speak with her sister in law Jaclyn Shaggy to let her know status. Spoke with patient's sister in law [place] to advise of patient's choice and next steps in the residential hospice process.  Notified Sarah with [facility] of referral to [facility] for residential hospice. Judson Roch will follow for hospice needs, no beds available today.  TOC will continue to follow for discharge needs.       Expected Discharge Plan: [place] Barriers to Discharge: Continued Medical Work up  Expected Discharge Plan and Services Expected Discharge Plan: [place] In-house Referral: NA Discharge Planning Services: CM Consult Post Acute Care Choice: Hospice Living arrangements for the past 2 months: Single Family Home                 DME Arranged: N/A DME Agency: NA       HH Arranged: NA HH Agency: NA         Social Determinants of Health (SDOH) Interventions    Readmission Risk Interventions    03/02/2020    4:00 PM  Readmission Risk Prevention [place] Appt Patient refused  Appt Comments pt will schedule her own  Medication Screening Complete  Transportation Screening Complete

## 2022-04-04 DIAGNOSIS — K255 Chronic or unspecified gastric ulcer with perforation: Secondary | ICD-10-CM | POA: Diagnosis not present

## 2022-04-04 DIAGNOSIS — N179 Acute kidney failure, unspecified: Secondary | ICD-10-CM

## 2022-04-04 MED ORDER — SODIUM CHLORIDE 0.9 % IV SOLN
0.3000 mg/h | INTRAVENOUS | Status: DC
  Administered 2022-04-04: 0.3 mg/h via INTRAVENOUS
  Filled 2022-04-04: qty 2.5

## 2022-04-04 MED ORDER — ONDANSETRON HCL 4 MG/2ML IJ SOLN: 4.0000 mg | Freq: Four times a day (QID) | INTRAMUSCULAR | 0 refills | Status: AC | PRN

## 2022-04-04 MED ORDER — SODIUM CHLORIDE 0.9 % IV SOLN: 0.3000 mg/h | INTRAVENOUS | 0 refills | Status: AC

## 2022-04-04 NOTE — Progress Notes (Signed)
Attempted to visit with patient as a follow up. Patient had just been medicated and unable to communicate.

## 2022-04-04 NOTE — Progress Notes (Signed)
  [facility] Progress Note  Patient: Bridget Grant     ZOX:[id number]  DOA: 04/01/2022   PCP: [name], MD       Brief hospital course: This is a 76 year old female with metastatic GIST tumor, diabetes mellitus who presents to the hospital for severe abdominal pain.    CT imaging reveals: Suspected perforation of the stomach with moderate amount of intraperitoneal free air, increasing hepatic metastatic disease& omental caking 1 compared to 02/14/2022. In the ED, she was evaluated by general surgery who did not feel that she was a candidate for further surgery and hospice was recommended.  The patient agreed to this plan.  Subjective:  Abdominal pain is better controlled with current pain medication.   Assessment and Plan: Principal Problem:   Gastric perforation related to metastatic GIST tumor -Status post gastrectomy in 2021-developed recurrence and declined surgery in February 2023 - has followed with Dr Burr Medico, oncology, for treatment - continue comfort care with plan for hospice home   - using IV Dilaudid and IV Zofran for comfort      Code Status: DNR   Level of Care: Level of care: Palliative Care  Objective:   Vitals:   04/03/22 0000 04/03/22 0818 04/03/22 0819 04/03/22 2049  BP:  (!) 124/57  128/65  Pulse:  (!) 101  (!) 108  Resp:  (!) '26 20 18  '$ Temp:  98.3 F (36.8 C)  98.2 F (36.8 C)  TempSrc:  Axillary  Oral  SpO2: 92% 98%  95%  Weight:      Height:       Filed Weights   04/02/22 0222  Weight: 68.9 kg   Exam: General exam: Appears comfortable  HEENT: oral mucosa moist Respiratory system: Clear to auscultation.  Cardiovascular system: S1 & S2 heard  Gastrointestinal system: Abdomen soft, severely distended with mild tenderness and no bowel sounds    Extremities: No cyanosis, clubbing or edema Psychiatry:  Mood & affect appropriate.      Imaging and lab data was personally reviewed    CBC: Recent Labs  Lab 04/01/22 1510  04/02/22 0313  WBC 5.9 2.6*  NEUTROABS 5.0 2.0  HGB 10.5* 9.1*  HCT 33.2* 29.4*  MCV 103.1* 104.6*  PLT 406* 811    Basic Metabolic Panel: Recent Labs  Lab 04/01/22 1510 04/02/22 0313  NA 136 137  K 4.5 5.0  CL 99 106  CO2 22 18*  GLUCOSE 136* 131*  BUN 41* 46*  CREATININE 2.92* 1.92*  CALCIUM 8.9 7.8*  MG  --  1.8  PHOS  --  4.0    GFR: Estimated Creatinine Clearance: 23.4 mL/min (A) (by C-G formula based on SCr of 1.92 mg/dL (H)).  Scheduled Meds:  Chlorhexidine Gluconate Cloth  6 each Topical Daily   mouth rinse  15 mL Mouth Rinse 4 times per day   Continuous Infusions:  promethazine (PHENERGAN) injection (IM or IVPB)       LOS: 3 days   Author: Debbe Odea  04/04/2022 11:05 AM

## 2022-04-04 NOTE — Plan of Care (Signed)
  Problem: Coping: Goal: Level of anxiety will decrease Outcome: Progressing   Problem: Pain Managment: Goal: General experience of comfort will improve Outcome: Progressing   Problem: Safety: Goal: Ability to remain free from injury will improve Outcome: Progressing   

## 2022-04-04 NOTE — Progress Notes (Signed)
Manufacturing engineer [facility]) Hospital Liaison Note   Received request from [facility] [place] for family interest in [facility]. Visited patient at bedside and spoke with  brother/Mark to confirm interest and explain services.   Approval for [facility] is determined by [facility] MD. Once [facility] MD has determined [facility] eligibility, [place] will update hospital staff and family. Patient approved   Unfortunately, [facility] is not able to offer a room today. Family and [facility] Manager aware hospital liaison will follow up tomorrow or sooner if a room becomes available.    Please do not hesitate to call with any hospice related questions.    Thank you for the opportunity to participate in this patient's care.   Daphene Calamity, MSW [facility] Liaison [phone]

## 2022-04-04 NOTE — Progress Notes (Signed)
Patient ID: Bridget Grant, female   DOB: [birth year], 76 y.o.   MRN: [id number]  Report called to [facility] and received by Nash Mantis RN. No further questions at this time. PTAR set up for 7pm. Family to stay with her until pickup.  Haydee Salter, RN

## 2022-04-04 NOTE — TOC Transition Note (Addendum)
Transition of Care [facility]) - CM/SW Discharge Note   Patient Details  Name: Bridget Grant MRN: [id number] Date of Birth: [birth year]  Transition of Care [facility]) CM/SW Contact:  Lennart Pall, LCSW Phone Number: 04/04/2022, 3:23 PM   Clinical Narrative:    Bed now available today at [facility] and pt/family aware with paperwork completed.  RN to call report to [phone]. Have set up PTAR for 7pm transport per facility request.  No further TOC needs.   Final next level of care: [place] Barriers to Discharge: Barriers Resolved   Patient Goals and CMS Choice Patient states their goals for this hospitalization and ongoing recovery are:: go to residential hospice CMS Medicare.gov Compare Post Acute Care list provided to:: Patient Choice offered to / list presented to : Patient  Discharge Placement                       Discharge Plan and Services In-house Referral: Hospice / Palliative Care Discharge Planning Services: CM Consult Post Acute Care Choice: Hospice          DME Arranged: N/A DME Agency: NA       HH Arranged: NA HH Agency: NA        Social Determinants of Health (SDOH) Interventions     Readmission Risk Interventions    04/03/2022    4:01 PM 03/02/2020    4:00 PM  Readmission Risk Prevention Plan  Post Dischage Appt  Patient refused  Appt Comments  pt will schedule her own  Medication Screening  Complete  Transportation Screening Complete Complete  PCP or Specialist Appt within 5-7 Days Complete   Home Care Screening Complete   Medication Review (RN CM) Complete

## 2022-04-04 NOTE — Discharge Summary (Signed)
Physician Discharge Summary  Bridget Grant HRC:[id number] DOB: 1946/03/26 DOA: 04/01/2022  PCP: [name], MD  Admit date: 04/01/2022 Discharge date: 04/04/2022 Discharging to: hospice home    Consults:  General surgery Palliative care     Discharge Diagnoses:   Principal Problem:   Gastric perforation [facility]) Active Problems:   Need for emotional support   Pain   High risk medication use   Acute kidney injury [facility])   Metastatic malignant neoplasm ([place])   Malignant gastrointestinal stromal tumor ([place])   Counseling and coordination of care   Goals of care, counseling/discussion   Palliative care encounter     Hospital Course:  This is a 76 year old female with metastatic GIST tumor, diabetes mellitus who presents to the hospital for severe abdominal pain.    CT imaging reveals: Suspected perforation of the stomach with moderate amount of intraperitoneal free air, increasing hepatic metastatic disease& omental caking 1 compared to 02/14/2022. In the ED, she was evaluated by general surgery who did not feel that she was a candidate for further surgery and hospice was recommended.  The patient agreed to this plan.  Principal Problem:   Gastric perforation related to metastatic GIST tumor -Status post gastrectomy in 2021-developed recurrence and declined surgery in February 2023 - has followed with Dr Burr Medico, oncology, for treatment - continue comfort care - transitioning to hospice home       Body mass index is 27.78 kg/m. Nutrition Status:          Discharge Instructions  Discharge Instructions     Increase activity slowly   Complete by: As directed       Allergies as of 04/04/2022   No Known Allergies      Medication List     STOP taking these medications    atorvastatin 20 MG tablet Commonly known as: LIPITOR   diltiazem 240 MG 24 hr capsule Commonly known as: CARDIZEM CD   hydrochlorothiazide 25 MG tablet Commonly known as: HYDRODIURIL    lisinopril 20 MG tablet Commonly known as: ZESTRIL   OVER THE COUNTER MEDICATION   pantoprazole 40 MG tablet Commonly known as: PROTONIX   PRENATAL PO   SUNItinib 37.5 MG capsule Commonly known as: SUTENT   THERAWORX RELIEF EX   triamcinolone cream 0.1 % Commonly known as: KENALOG   trolamine salicylate 10 % cream Commonly known as: ASPERCREME       TAKE these medications    acetaminophen 500 MG tablet Commonly known as: TYLENOL Take 1,000 mg by mouth every 6 (six) hours as needed for moderate pain.   HYDROmorphone 25 mg in sodium chloride 0.9 % 47.5 mL Inject 0.3 mg/hr into the vein continuous.   metFORMIN 500 MG 24 hr tablet Commonly known as: GLUCOPHAGE-XR Take 2 tablets (1,000 mg total) by mouth daily with breakfast.   ondansetron 4 MG/2ML Soln injection Commonly known as: ZOFRAN Inject 2 mLs (4 mg total) into the vein every 6 (six) hours as needed for nausea.   ondansetron 8 MG disintegrating tablet Commonly known as: Zofran ODT Take 1 tablet (8 mg total) by mouth every 8 (eight) hours as needed for nausea or vomiting.            The results of significant diagnostics from this hospitalization (including imaging, microbiology, ancillary and laboratory) are listed below for reference.    CT Abdomen Pelvis Wo Contrast  Addendum Date: 04/01/2022   ADDENDUM REPORT: 04/01/2022 18:06 ADDENDUM: The original report was by Dr. Van Clines. The  following addendum is by Dr. Van Clines: Critical Value/emergent results were called by telephone at the time of interpretation on 04/01/2022 at 6:02 pm to provider Joanette Gula , who verbally acknowledged these results. Electronically Signed   By: Van Clines M.D.   On: 04/01/2022 18:06   Result Date: 04/01/2022 CLINICAL DATA:  Abdominal pain for 3 days. Progressive GI stromal tumor with omental and liver metastases. * Tracking Code: BO * EXAM: CT ABDOMEN AND PELVIS WITHOUT CONTRAST TECHNIQUE:  Multidetector CT imaging of the abdomen and pelvis was performed following the standard protocol without IV contrast. RADIATION DOSE REDUCTION: This exam was performed according to the departmental dose-optimization program which includes automated exposure control, adjustment of the mA and/or kV according to patient size and/or use of iterative reconstruction technique. COMPARISON:  PET-CT 02/14/2022 FINDINGS: Lower chest: Subpleural reticulation in the lung bases compatible with scarring and fibrosis. Solid-appearing left breast nodule 2.2 by 1.4 cm on image 8 of series 2, formerly the same on 02/14/2022. Dilated esophagus, fluid-filled. Left anterior descending and right coronary artery atherosclerotic vascular disease. Hepatobiliary: Extensive hepatic metastatic disease with tumors in all hepatic segments. Index lesion in the anterior dome of the liver measures 4.2 by 3.8 cm on image 16 series 2, formerly 3.4 by 3.1 cm. Cholecystectomy noted. Pancreas: Atrophic pancreas, otherwise unremarkable. Spleen: Unremarkable Adrenals/Urinary Tract: 1.4 by 1.0 cm mass of the lateral limb left adrenal gland, internal density 8 Hounsfield units favoring adenoma. The kidneys appear normal. Stomach/Bowel: Marked wall thickening of the stomach with postoperative findings and confluent nodular tumor anterior to the stomach and caking of the omentum. Suspected perforation of the greater curvature of the stomach anteriorly in the vicinity of images 43 through 50 of series 2, with associated moderate scattered free intraperitoneal gas in non dependent regions. There is some fluid anterior to the gastric wall. No dilated bowel. There is descending and sigmoid colon diverticulosis. Vascular/Lymphatic: Atherosclerosis is present, including aortoiliac atherosclerotic disease. Reproductive: Uterus absent. Adnexa unremarkable. Other: Extensive omental caking of tumor, worsened compared to 02/14/2022. Moderate amount of ascites, likely  malignant. Musculoskeletal: Grade 1 degenerative anterolisthesis at L4-5. Small umbilical hernia contains adipose tissue and a small amount of fluid or nodularity. IMPRESSION: 1. Suspected perforation of the greater curvature of the stomach anteriorly in the vicinity of the presume GI stromal tumor, with moderate free intraperitoneal gas in non dependent regions and moderate new likely malignant ascites. 2. Extensive hepatic metastatic disease, worsened compared to 02/14/2022. 3. Extensive omental caking of tumor, worsened compared to 02/14/2022. 4. Dilated fluid-filled esophagus, query dysmotility or gastroesophageal reflux. 5. Solid-appearing left breast nodule, formerly the same on 02/14/2022. This was not hypermetabolic on the PET-CT from 02/11/2020. 6. Other imaging findings of potential clinical significance: Coronary atherosclerosis. Small umbilical hernia contains adipose tissue and a small amount of fluid or nodularity. Subpleural reticulation in the lung bases compatible with scarring and fibrosis. Small left adrenal lesion favoring adenoma based on density. 7.  Aortic Atherosclerosis (ICD10-I70.0).  Coronary atherosclerosis. Radiology assistant personnel have been notified to put me in telephone contact with the referring physician or the referring physician's clinical representative in order to discuss these findings. Once this communication is established I will issue an addendum to this report for documentation purposes. Electronically Signed: By: Van Clines M.D. On: 04/01/2022 17:59   Labs:   Basic Metabolic Panel: Recent Labs  Lab 04/01/22 1510 04/02/22 0313  NA 136 137  K 4.5 5.0  CL 99 106  CO2 22 18*  GLUCOSE 136* 131*  BUN 41* 46*  CREATININE 2.92* 1.92*  CALCIUM 8.9 7.8*  MG  --  1.8  PHOS  --  4.0     CBC: Recent Labs  Lab 04/01/22 1510 04/02/22 0313  WBC 5.9 2.6*  NEUTROABS 5.0 2.0  HGB 10.5* 9.1*  HCT 33.2* 29.4*  MCV 103.1* 104.6*  PLT 406* 293          SIGNED:   Debbe Odea, MD  [facility] 04/04/2022, 12:54 PM

## 2022-04-04 NOTE — Progress Notes (Signed)
Dilaudid drip leftover medication measured at 50 cc and wasted in stericycle with Otila Back, RN.

## 2022-04-04 NOTE — Progress Notes (Signed)
Daily Progress Note   Patient Name: Bridget Grant       Date: 04/04/2022 DOB: [birth year]  Age: 76 y.o. MRN#: [id number] Attending Physician: Debbe Odea, MD Primary Care Physician: [name], MD Admit Date: 04/01/2022 Length of Stay: 3 days  Reason for Consultation/Follow-up: Goals of care, terminal care  Subjective:   CC/Subjective: Patient's pain not controlled.  Patient is comfort care focused.  Followed up regarding pain management.  Coordination with nursing care.  Patient at time of EMR review has received Dilaudid IV 1 mg x 6 doses in the past 24 hours.  Patient's pain is currently worsening and uncontrolled.  Discussed starting patient on continuous pain medication for management.  Still working for transition to inpatient hospice at [facility].  Objective:   Vital Signs:  BP 128/65 (BP Location: Left Arm)   Pulse (!) 108   Temp 98.2 F (36.8 C) (Oral)   Resp 18   Ht '5\' 2"'$  (1.575 m)   Wt 68.9 kg   SpO2 95%   BMI 27.78 kg/m   Physical Exam: Appears uncomfortable, grimacing laying in bed  Assessment & Plan:   Assessment: Patient is a 76 year old female with a past medical history of metastatic gastric GIST status post robotic partial gastrectomy in 02/2020, hypertension, type 2 diabetes, hyperlipidemia, and chronic anxiety/depression who was admitted on 10/23 for management of severe abdominal pain that have been present for 2 days prior.  Since being admitted, CT imaging revealed findings of gastric perforation, a large mass with omental caking that appeared to be fixed or invading the abdominal wall on the left hemiabdomen, and numerous peritoneal needle metastases on the anterior abdominal wall bilaterally which had all worsened since imaging on 02/14/2022.  Patient was evaluated by surgery who noted that unfortunately due to the extent of her metastatic disease, surgical intervention was not appropriate as her situation cannot be fixed surgically.  Palliative care  consulted to assist with complex medical decision-making and on 10/24 patient was transition to comfort care focus.  Recommendations/Plan: # Complex medical decision making/goals of care:  - Patient is comfort care focus at this time.  Planning for transfer to inpatient hospice unit at [facility] once accepted.  -  Code Status: DNR Prognosis: Days to short weeks  # Symptom management:  -Pain, severely worsening acute pain in the setting of gastric perforation with underlying metastatic GIST to the tumor.   At time of EMR review patient had required IV Dilaudid 1 mg x 6 doses over the past 24 hours.  Patient's pain still uncontrolled at this time.     -Ordered to start IV Dilaudid basal dosing 0.3 mg/hr for continuous pain management.   -Continue IV Dilaudid 1 mg every hour as needed for pain or dyspnea.   -Anxiety, in the setting of gastric perspiration and end-of-life care                               -Continue IV lorazepam 0.5-1 mg every 6 hours as needed.    # Discharge Planning:   -Patient will be transferring to inpatient hospice facility today hopefully.  Thank you for allowing the palliative care team to participate in the care [name].  Chelsea Aus, DO Palliative Care Provider PMT # [phone]  If patient remains symptomatic despite maximum doses, please call PMT at [phone] between 0700 and 1900. Outside of these hours, please call attending, as PMT does  not have night coverage.

## 2022-04-04 NOTE — Progress Notes (Signed)
[facility] here to transport patient to [facility].  All belongings with patient.  All questions and concerns addressed.  Left over Dilauded gtt measured and wasted in [employer] with fellow RN Amy Abernathy.

## 2022-04-06 ENCOUNTER — Other Ambulatory Visit: Payer: Self-pay | Admitting: Internal Medicine

## 2022-04-06 LAB — CULTURE, BLOOD (ROUTINE X 2)
Culture: NO GROWTH
Special Requests: ADEQUATE

## 2022-04-07 LAB — CULTURE, BLOOD (ROUTINE X 2)
Culture: NO GROWTH
Special Requests: ADEQUATE

## 2022-04-10 ENCOUNTER — Telehealth: Payer: Self-pay | Admitting: Internal Medicine

## 2022-04-10 NOTE — Telephone Encounter (Signed)
Patients brother called and said that patient passed away from stomach cancer on 2022/04/14. He called because they had received a call that she had an appointment this coming Monday. I canceled the appointment

## 2022-04-10 DEATH — deceased

## 2022-04-15 ENCOUNTER — Ambulatory Visit: Payer: Medicare Other | Admitting: Internal Medicine

## 2022-04-16 ENCOUNTER — Inpatient Hospital Stay: Payer: Medicare Other | Attending: Hematology

## 2022-04-17 ENCOUNTER — Other Ambulatory Visit: Payer: Self-pay

## 2022-04-19 ENCOUNTER — Ambulatory Visit: Payer: Medicare Other | Admitting: Hematology

## 2022-04-19 ENCOUNTER — Ambulatory Visit: Payer: Medicare Other
